# Patient Record
Sex: Female | Born: 1975
Health system: Southern US, Community
[De-identification: ages and names within clinical notes are randomized; demographics above are authoritative.]

## PROBLEM LIST (undated history)

## (undated) ENCOUNTER — Emergency Department (HOSPITAL_COMMUNITY): Admission: EM | Payer: Medicaid Other

## (undated) DIAGNOSIS — I10 Essential (primary) hypertension: Secondary | ICD-10-CM

## (undated) DIAGNOSIS — E079 Disorder of thyroid, unspecified: Secondary | ICD-10-CM

## (undated) DIAGNOSIS — E039 Hypothyroidism, unspecified: Secondary | ICD-10-CM

## (undated) DIAGNOSIS — M549 Dorsalgia, unspecified: Secondary | ICD-10-CM

## (undated) DIAGNOSIS — B1920 Unspecified viral hepatitis C without hepatic coma: Secondary | ICD-10-CM

## (undated) DIAGNOSIS — IMO0002 Reserved for concepts with insufficient information to code with codable children: Secondary | ICD-10-CM

## (undated) DIAGNOSIS — M5430 Sciatica, unspecified side: Secondary | ICD-10-CM

## (undated) DIAGNOSIS — Z8719 Personal history of other diseases of the digestive system: Secondary | ICD-10-CM

## (undated) DIAGNOSIS — M25551 Pain in right hip: Secondary | ICD-10-CM

## (undated) DIAGNOSIS — F418 Other specified anxiety disorders: Secondary | ICD-10-CM

## (undated) DIAGNOSIS — F329 Major depressive disorder, single episode, unspecified: Secondary | ICD-10-CM

## (undated) DIAGNOSIS — K219 Gastro-esophageal reflux disease without esophagitis: Secondary | ICD-10-CM

## (undated) DIAGNOSIS — F4310 Post-traumatic stress disorder, unspecified: Secondary | ICD-10-CM

## (undated) DIAGNOSIS — E162 Hypoglycemia, unspecified: Secondary | ICD-10-CM

## (undated) DIAGNOSIS — F319 Bipolar disorder, unspecified: Secondary | ICD-10-CM

## (undated) DIAGNOSIS — F1911 Other psychoactive substance abuse, in remission: Secondary | ICD-10-CM

## (undated) DIAGNOSIS — F1011 Alcohol abuse, in remission: Secondary | ICD-10-CM

## (undated) DIAGNOSIS — E7800 Pure hypercholesterolemia, unspecified: Secondary | ICD-10-CM

## (undated) DIAGNOSIS — Z9989 Dependence on other enabling machines and devices: Secondary | ICD-10-CM

## (undated) DIAGNOSIS — R0602 Shortness of breath: Secondary | ICD-10-CM

## (undated) DIAGNOSIS — J449 Chronic obstructive pulmonary disease, unspecified: Secondary | ICD-10-CM

## (undated) DIAGNOSIS — J45909 Unspecified asthma, uncomplicated: Secondary | ICD-10-CM

## (undated) DIAGNOSIS — F32A Depression, unspecified: Secondary | ICD-10-CM

## (undated) DIAGNOSIS — M1990 Unspecified osteoarthritis, unspecified site: Secondary | ICD-10-CM

## (undated) HISTORY — PX: CARPAL TUNNEL RELEASE: SHX101

## (undated) HISTORY — DX: Sciatica, unspecified side: M54.30

## (undated) HISTORY — DX: Major depressive disorder, single episode, unspecified: F32.9

## (undated) HISTORY — DX: Other specified anxiety disorders: F41.8

## (undated) HISTORY — PX: TUBAL LIGATION: SHX77

## (undated) HISTORY — DX: Unspecified viral hepatitis C without hepatic coma: B19.20

## (undated) HISTORY — DX: Alcohol abuse, in remission: F10.11

## (undated) HISTORY — PX: SHOULDER SURGERY: SHX246

## (undated) HISTORY — DX: Post-traumatic stress disorder, unspecified: F43.10

## (undated) HISTORY — DX: Depression, unspecified: F32.A

## (undated) HISTORY — DX: Other psychoactive substance abuse, in remission: F19.11

## (undated) HISTORY — PX: WISDOM TOOTH EXTRACTION: SHX21

---

## 2001-04-04 ENCOUNTER — Ambulatory Visit (HOSPITAL_COMMUNITY): Admission: RE | Admit: 2001-04-04 | Discharge: 2001-04-04 | Payer: Self-pay | Admitting: Internal Medicine

## 2001-04-04 ENCOUNTER — Encounter: Payer: Self-pay | Admitting: Internal Medicine

## 2001-10-31 ENCOUNTER — Ambulatory Visit (HOSPITAL_COMMUNITY): Admission: RE | Admit: 2001-10-31 | Discharge: 2001-10-31 | Payer: Self-pay | Admitting: Internal Medicine

## 2001-10-31 ENCOUNTER — Encounter: Payer: Self-pay | Admitting: Internal Medicine

## 2004-08-18 ENCOUNTER — Encounter: Admission: RE | Admit: 2004-08-18 | Discharge: 2004-10-13 | Payer: Self-pay | Admitting: Orthopedic Surgery

## 2004-10-21 ENCOUNTER — Encounter: Admission: RE | Admit: 2004-10-21 | Discharge: 2004-11-17 | Payer: Self-pay | Admitting: Orthopedic Surgery

## 2005-02-14 ENCOUNTER — Ambulatory Visit: Payer: Self-pay | Admitting: Psychiatry

## 2005-02-14 ENCOUNTER — Inpatient Hospital Stay (HOSPITAL_COMMUNITY): Admission: RE | Admit: 2005-02-14 | Discharge: 2005-02-19 | Payer: Self-pay | Admitting: Psychiatry

## 2005-05-02 ENCOUNTER — Encounter: Admission: RE | Admit: 2005-05-02 | Discharge: 2005-06-22 | Payer: Self-pay | Admitting: Orthopaedic Surgery

## 2006-04-10 ENCOUNTER — Ambulatory Visit: Payer: Self-pay | Admitting: Orthopedic Surgery

## 2006-04-17 ENCOUNTER — Encounter: Admission: RE | Admit: 2006-04-17 | Discharge: 2006-04-17 | Payer: Self-pay | Admitting: Orthopedic Surgery

## 2006-05-10 ENCOUNTER — Emergency Department (HOSPITAL_COMMUNITY): Admission: EM | Admit: 2006-05-10 | Discharge: 2006-05-10 | Payer: Self-pay | Admitting: Emergency Medicine

## 2006-10-18 ENCOUNTER — Ambulatory Visit (HOSPITAL_BASED_OUTPATIENT_CLINIC_OR_DEPARTMENT_OTHER): Admission: RE | Admit: 2006-10-18 | Discharge: 2006-10-18 | Payer: Self-pay | Admitting: Orthopedic Surgery

## 2007-03-14 ENCOUNTER — Emergency Department (HOSPITAL_COMMUNITY): Admission: EM | Admit: 2007-03-14 | Discharge: 2007-03-14 | Payer: Self-pay | Admitting: Pediatrics

## 2008-06-21 ENCOUNTER — Emergency Department (HOSPITAL_COMMUNITY): Admission: EM | Admit: 2008-06-21 | Discharge: 2008-06-21 | Payer: Self-pay | Admitting: Emergency Medicine

## 2009-03-12 ENCOUNTER — Emergency Department (HOSPITAL_COMMUNITY): Admission: EM | Admit: 2009-03-12 | Discharge: 2009-03-12 | Payer: Self-pay | Admitting: Emergency Medicine

## 2009-06-13 ENCOUNTER — Emergency Department (HOSPITAL_COMMUNITY): Admission: EM | Admit: 2009-06-13 | Discharge: 2009-06-13 | Payer: Self-pay | Admitting: Emergency Medicine

## 2009-07-06 ENCOUNTER — Emergency Department (HOSPITAL_COMMUNITY): Admission: EM | Admit: 2009-07-06 | Discharge: 2009-07-06 | Payer: Self-pay | Admitting: Emergency Medicine

## 2009-08-10 ENCOUNTER — Emergency Department (HOSPITAL_COMMUNITY): Admission: EM | Admit: 2009-08-10 | Discharge: 2009-08-10 | Payer: Self-pay | Admitting: Emergency Medicine

## 2009-10-12 ENCOUNTER — Emergency Department: Payer: Self-pay | Admitting: Emergency Medicine

## 2009-11-23 ENCOUNTER — Emergency Department (HOSPITAL_COMMUNITY): Admission: EM | Admit: 2009-11-23 | Discharge: 2009-11-23 | Payer: Self-pay | Admitting: Emergency Medicine

## 2010-06-22 ENCOUNTER — Encounter
Admission: RE | Admit: 2010-06-22 | Discharge: 2010-09-16 | Payer: Self-pay | Source: Home / Self Care | Attending: *Deleted | Admitting: *Deleted

## 2011-02-04 NOTE — Op Note (Signed)
Deborah Barnes, [name]             ACCOUNT NO.:  [id number]   MEDICAL RECORD NO.:  [id number]          PATIENT TYPE:  AMB   LOCATION:  DSC                          FACILITY:  [facility]   PHYSICIAN:  Artist Pais. Weingold, M.D.DATE OF BIRTH:  Apr 18, 1976   DATE OF PROCEDURE:  10/18/2006  DATE OF DISCHARGE:                               OPERATIVE REPORT   PREOPERATIVE DIAGNOSIS:  Chronic bilateral lateral epicondylitis and  chronic left carpal tunnel syndrome.   POSTOPERATIVE DIAGNOSIS:  Chronic bilateral lateral epicondylitis and  chronic left carpal tunnel syndrome.   PROCEDURE:  Left carpal tunnel release and injection bilateral  epicondyle.   SURGEON:  Weingold.   ASSISTANT:  None.   ANESTHESIA:  General.   TOURNIQUET TIME:  15 minutes.   COMPLICATIONS:  None.   DRAINS:  None.   OPERATIVE REPORT:  The patient was taken to the operating suite.  After  the induction of adequate general anesthesia, the left upper extremity  is prepped and draped in the usual sterile fashion.  A 2-cm incision was  made in the palmar aspect of the left hand in line with the long finger  metacarpal, starting at [facility] cardinal line.  The skin was incised.  The palmar fascia was identified and split.  The distal edge of the  transverse carpal ligament was identified with a 15 blade and split.  The median nerve was identified and protected with a Therapist, nutritional.  The remaining aspect of the transverse carpal ligament was divided under  direct vision using a curved blunt scissor.  The canal was inspected.  There were no osseous lesions or ganglions present.  It was irrigated  and loosely closed with 3-0 Prolene subcuticular stitch.  Steri-Strips,  4 x 4s, fluffs, and compressive dressing was applied.  The patient then  had both of her lateral epicondyles prepped with alcohol preps and  injected with Celestone and Marcaine 1:1.  The patient tolerated all 3  procedures well and went to recovery room in  stable condition.      Artist Pais Mina Marble, M.D.  Electronically Signed     MAW/MEDQ  D:  10/18/2006  T:  10/18/2006  Job:  [id number]

## 2011-02-04 NOTE — Discharge Summary (Signed)
NAMEMALORIE, BIGFORD              ACCOUNT NO.:  [id number]   MEDICAL RECORD NO.:  [id number]          PATIENT TYPE:  IPS   LOCATION:  0506                          FACILITY:  [facility]   PHYSICIAN:  Geoffery Lyons, M.D.      DATE OF BIRTH:  April 11, 1976   DATE OF ADMISSION:  02/14/2005  DATE OF DISCHARGE:  02/19/2005                                 DISCHARGE SUMMARY   CHIEF COMPLAINT AND PRESENT ILLNESS:  This was first admission to [facility] for this 35 year old divorced white female involuntarily  admitted. History of intentional overdose. She overdosed on Depakote 500 - a  full bottle, Lexapro 20 mg taking 9, atenolol 50 uncertain amount, and  Topamax. They were the patient's medications, overdosed when she was at  home, left a note to her mother stating it is over. Had been smoking  crack, smoking too long, drinking liquor, and taken pain pill and marijuana.  She claimed the stressor was that she just ________________. Was found by  the mother and her boyfriend. Intention was to do die.   PAST PSYCHIATRIC HISTORY:  First time in [employer]. History of  bipolar disorder. Hospitalized prior at [facility] and [facility].  Past suicidal attempt by overdosing.   ALCOHOL AND DRUG HISTORY:  Smokes, has been drinking liquor, positive black  outs, denies any seizures, no IV drug use, using marijuana and cocaine as  well.   PAST MEDICAL HISTORY:  Hypertension.   MEDICATIONS:  1.  Atenolol 50 mg per day.  2.  Topamax uncertain dose.  3.  Lexapro 20 mg per day.  4.  Depakote ER 500 at night.   PHYSICAL EXAMINATION:  Physical exam performed, did not show any acute  findings.   LABORATORY DATA:  Drug screen positive for cocaine, marijuana,  benzodiazepines. The Depakote level was 146, down to 101 prior to  transverse. Depakote level upon this evaluation 140.9.   MENTAL STATUS EXAMINATION:  Reveals a sleepy female, sick of being  cooperative, makes fair eye  contact. Does fall asleep when not stimulated  at times. Speech was soft spoken, normal pace. Mood was depressed. Affect  was flat. Thought processes were logical, coherent and relevant. No evidence  of psychosis. No active suicidal or homicidal ideations. Cognition well  preserved.   ADMISSION DIAGNOSES:   AXIS I:  1.  Bipolar disorder.  2.  Polysubstance abuse.   AXIS II:  Borderline personality disorder traits.   AXIS III:  Hypertension.   AXIS IV:  Moderate.   AXIS V:  Global Assessment of Functioning upon admission 25, highest Global  Assessment of Functioning in the last year 60.   COURSE IN THE HOSPITAL:  She was admitted and started in individual and  group psychotherapy. She was given Ambien for sleep. She was detoxified with  Librium, and she was given Nexium. She was restarted on Topamax 100 twice a  day. She was given trazodone for sleep. Trazodone was not effective. She was  switched to [facility]. Alfonso Patten was not effective, so she went back to the  Ambien. She endorsed  she has been under a lot of stress, increasingly more  depressed, using substances. Was feeling not supported by the boyfriend when  he is out of town, and apparently he goes out of town a lot. Mood depressed.  Affect anxious. Endorsed pain, feeling overwhelmed. Endorsed being very  upset because she did not want to be a crack head. Claims that is why she  overdosed, feeling guilty for her use. Sleep was an issue. Endorsed  nightmares early in the morning. Continued to focus on her not wanting to be  a crack head. By June 3, she endorsed she was feeling much better. Mood had  improved. Had to work on self and coping skills. Family session with mother,  she reported not being depressed, endorsed that she understood that she  needed to change her lifestyle. Mother was supportive. Endorsed also that  other family members and boyfriend were supportive. She was going to attend  [employer], get a sponsor, got to  counseling, and take her medication. She was in  full contact with reality. No suicidal and no homicidal ideations. No  withdrawal. So we went ahead and discharged to outpatient followup.   DISCHARGE DIAGNOSES:   AXIS I:  1.  Bipolar disorder, not otherwise specified.  2.  Polysubstance abuse.   AXIS II:  Borderline personality traits.   AXIS III:  Hypertension.   AXIS IV:  Moderate.   AXIS V:  Global Assessment of Functioning upon discharge 50.   DISCHARGE MEDICATIONS:  1.  Topamax 100 twice a day.  2.  Symmetrel 100 twice a day.  3.  Depakote ER 250 at night.  4.  Lunesta 3 mg at night as needed.   FOLLOW UP:  Yvonna Alanis in [place].     IL/MEDQ  D:  03/17/2005  T:  03/18/2005  Job:  [id number]

## 2011-02-04 NOTE — H&P (Signed)
Deborah Barnes, Deborah Barnes              ACCOUNT NO.:  [id number]   MEDICAL RECORD NO.:  [id number]          PATIENT TYPE:  IPS   LOCATION:  0506                          FACILITY:  [facility]   PHYSICIAN:  Geoffery Lyons, M.D.      DATE OF BIRTH:  1976/02/27   DATE OF ADMISSION:  02/14/2005  DATE OF DISCHARGE:                         PSYCHIATRIC ADMISSION ASSESSMENT   IDENTIFYING INFORMATION:  A 35 year old divorced white female voluntarily  admitted on Feb 14, 2005.   HISTORY OF PRESENT ILLNESS:  The patient presents with a history of  intentional overdose.  She overdosed on Depakote 500 mg, she reports a full  bottle, Lexapro 20 mg taking 9 and atenolol 50 mg uncertain amount, and  Topamax.  They were the patient's medications.  She overdosed on Monday  while she was at home.  She left a note to her mother stating It's over.  Patient states that she also has been smoking crack cocaine, smoking too  long, drinking liquor and taking pain pills and marijuana.  Patient reports  that her stressors are that all she does is sit around and live.  Patient  was found by her mother and a boyfriend.  Patient's intention was to die.  She is glad now that nothing has happened.  She wants to be there for the  children.  She is denying any psychotic symptoms.   PAST PSYCHIATRIC HISTORY:  First admission to [facility].  She  has a history of bipolar disorder.  She was hospitalized prior at [facility]  and [facility] for suicide attempts by overdosing.  No current  outpatient treatment.  She has not been detoxed prior.   SOCIAL HISTORY:  This is a 35 year old divorced white female with 2 children  ages 40 years and 68 months of age.  The children are with her mother.  The  patient lives with her mother and the children.  She is unemployed.  No  legal issues.   FAMILY HISTORY:  Unclear.   ALCOHOL AND DRUG HISTORY:  Patient smokes.  She has been drinking liquor.  Reports history of  blackouts, denies any seizures, no IV drug use.  She has  been using marijuana and crack cocaine as well.   PRIMARY CARE Ulysses Alper:  Dr. Prudy Feeler at [facility].   MEDICAL PROBLEMS:  Hypertension.   MEDICATIONS:  1.  Atenolol 50 mg daily.  2.  Topamax uncertain dose.  3.  Lexapro 20 mg daily.  4.  Depakote ER 500 mg at bedtime which were prescribed by Dr. Yetta Barre.   DRUG ALLERGIES:  No known allergies.   PHYSICAL EXAMINATION:  Patient was assessed at [facility].  This is  a well-developed, well-nourished female, obese.  She received charcoal.  Poison Control was notified.   LABORATORY DATA:  Urine drug screen was positive for cocaine, positive for  THC, positive for benzos.  Depakote level was 146 down to 101 prior to  transfer.  Depakote level today is 140.9.  Total protein 6.7, albumin 3.6.  EKG is a normal EKG.   MENTAL STATUS EXAM:  Somewhat sleepy  female in the bed.  She is sick of  being cooperative.  Makes fair eye contact.  She does fall sleep when not  stimulated at times.  Speech is soft spoken, normal pace.  Mood is  depressed.  Affect is somewhat flat.  Thought processes are coherent.  There  is no evidence of psychosis.  Cognitive function is intact.  Memory is fair.  Judgment is poor.  Insight is limited.  Poor impulse control.   ADMISSION DIAGNOSES:   AXIS I:  1.  Bipolar disorder not otherwise specified.  2.  Polysubstance abuse.   AXIS II:  Borderline disorder per chart.   AXIS III:  1.  Hypertension.  2.  Tobacco abuse.   AXIS IV:  Problems with primary support group, occupation, other  psychosocial problems.   AXIS V:  Current is 25, estimated this past year 74.   PLAN:  Stabilize mood and thinking.  Contract for safety.  We will detox the  patient from alcohol use, work on relapse prevention.  Patient is to  increase coping skills.  Tobacco cessation was discussed.  Patient is be  medication compliant.  Patient will need to  follow up with mental health, to  attend AA and NA meetings.  Poison Control will be notified about elevated  Depakote level and receive appropriate recommendations.  We will consider  family session with mother as well for support.  Tentative length of stay 5-  6 days.       JO/MEDQ  D:  02/16/2005  T:  02/16/2005  Job:  [id number]

## 2011-10-12 ENCOUNTER — Emergency Department (HOSPITAL_COMMUNITY)
Admission: EM | Admit: 2011-10-12 | Discharge: 2011-10-12 | Disposition: A | Discharge status: Home / Self Care | Payer: Medicaid Other | Attending: Emergency Medicine | Admitting: Emergency Medicine

## 2011-10-12 ENCOUNTER — Encounter (HOSPITAL_COMMUNITY): Payer: Self-pay

## 2011-10-12 DIAGNOSIS — I10 Essential (primary) hypertension: Secondary | ICD-10-CM | POA: Insufficient documentation

## 2011-10-12 DIAGNOSIS — F319 Bipolar disorder, unspecified: Secondary | ICD-10-CM | POA: Insufficient documentation

## 2011-10-12 DIAGNOSIS — S335XXA Sprain of ligaments of lumbar spine, initial encounter: Secondary | ICD-10-CM | POA: Insufficient documentation

## 2011-10-12 DIAGNOSIS — Z79899 Other long term (current) drug therapy: Secondary | ICD-10-CM | POA: Insufficient documentation

## 2011-10-12 DIAGNOSIS — E7800 Pure hypercholesterolemia, unspecified: Secondary | ICD-10-CM | POA: Insufficient documentation

## 2011-10-12 DIAGNOSIS — K219 Gastro-esophageal reflux disease without esophagitis: Secondary | ICD-10-CM | POA: Insufficient documentation

## 2011-10-12 DIAGNOSIS — X503XXA Overexertion from repetitive movements, initial encounter: Secondary | ICD-10-CM | POA: Insufficient documentation

## 2011-10-12 DIAGNOSIS — S39012A Strain of muscle, fascia and tendon of lower back, initial encounter: Secondary | ICD-10-CM

## 2011-10-12 HISTORY — DX: Pure hypercholesterolemia, unspecified: E78.00

## 2011-10-12 HISTORY — DX: Essential (primary) hypertension: I10

## 2011-10-12 HISTORY — DX: Gastro-esophageal reflux disease without esophagitis: K21.9

## 2011-10-12 HISTORY — DX: Dorsalgia, unspecified: M54.9

## 2011-10-12 MED ORDER — METHOCARBAMOL 500 MG PO TABS: ORAL_TABLET | ORAL | Status: DC

## 2011-10-12 MED ORDER — OXYCODONE-ACETAMINOPHEN 5-325 MG PO TABS: 1.0000 | ORAL_TABLET | ORAL | Status: AC | PRN

## 2011-10-12 NOTE — ED Notes (Signed)
Pt reports had been moving furniture and cleaning her grandmother's house today.  Pt says felt something pop in lower back.  Has had pain since.

## 2011-10-12 NOTE — ED Provider Notes (Signed)
History     CSN: [id number]  Arrival date & time 10/12/11  1707   First MD Initiated Contact with Patient 10/12/11 1728      Chief Complaint  Patient presents with  . Back Pain    (Consider location/radiation/quality/duration/timing/severity/associated sxs/prior treatment) HPI Comments: Patient c/o diffuse lower back pain for several hours.  PAin began after moving furniture.  Describes the pain as sharp and throbbing.  Pain radiates across her lower back.  She denies fall, incontinence of urine or feces, weakness or numbness of the lower extremities.    Patient is a 36 y.o. female presenting with back pain. The history is provided by the patient. No language interpreter was used.  Back Pain  This is a new problem. The current episode started 6 to 12 hours ago. The problem occurs constantly. The problem has not changed since onset.The pain is associated with lifting heavy objects and twisting. The pain is present in the lumbar spine. The quality of the pain is described as aching. The pain does not radiate. The pain is moderate. The symptoms are aggravated by bending, twisting and certain positions. The pain is the same all the time. Pertinent negatives include no chest pain, no fever, no numbness, no abdominal pain, no abdominal swelling, no bowel incontinence, no perianal numbness, no bladder incontinence, no dysuria, no pelvic pain, no leg pain, no paresthesias, no paresis, no tingling and no weakness. Treatments tried: ultram. The treatment provided no relief.    Past Medical History  Diagnosis Date  . Hypertension   . Hypercholesterolemia   . GERD (gastroesophageal reflux disease)   . Back pain   . Bipolar 1 disorder     Past Surgical History  Procedure Date  . Carpal tunnel release   . Tubal ligation     History reviewed. No pertinent family history.  History  Substance Use Topics  . Smoking status: Current Everyday Smoker  . Smokeless tobacco: Not on file  . Alcohol  Use: No    OB History    Grav Para Term Preterm Abortions TAB SAB Ect Mult Living                  Review of Systems  Constitutional: Negative for fever, activity change and appetite change.  Cardiovascular: Negative for chest pain.  Gastrointestinal: Negative for abdominal pain and bowel incontinence.  Genitourinary: Negative for bladder incontinence, dysuria, hematuria, flank pain, difficulty urinating and pelvic pain.  Musculoskeletal: Positive for back pain. Negative for joint swelling and gait problem.  Skin: Negative.   Neurological: Negative for tingling, weakness, numbness and paresthesias.  All other systems reviewed and are negative.    Allergies  Darvocet  Home Medications   Current Outpatient Rx  Name Route Sig Dispense Refill  . ALPRAZOLAM 1 MG PO TABS Oral Take 1 mg by mouth 3 (three) times daily as needed. For ANXIETY    . VITAMIN C PO Oral Take 1 tablet by mouth daily.    . ATENOLOL 50 MG PO TABS Oral Take 25 mg by mouth daily. For HIGH BLOOD PRESSURE    . CARBAMAZEPINE 200 MG PO TABS Oral Take 400 mg by mouth daily. for MOOD SWINGS    . CETIRIZINE HCL 10 MG PO TABS Oral Take 10 mg by mouth daily. For ALLERGIES    . VITAMIN D 2000 UNITS PO CAPS Oral Take 2 capsules by mouth daily.    . CHOLINE FENOFIBRATE 135 MG PO CPDR Oral Take 135 mg by mouth  daily. For HIGH TRIGLYCERIDES    . ESCITALOPRAM OXALATE 10 MG PO TABS Oral Take 10 mg by mouth daily. For DEPRESSION    . HYDROCHLOROTHIAZIDE 25 MG PO TABS Oral Take 12.5 mg by mouth daily. For FLUID    . HYDROCODONE-ACETAMINOPHEN 10-325 MG PO TABS Oral Take 1 tablet by mouth every 6 (six) hours as needed. FOR PAIN    . LAMOTRIGINE 200 MG PO TABS Oral Take 200 mg by mouth daily. For BIPOLAR    . OMEPRAZOLE 20 MG PO CPDR Oral Take 20 mg by mouth 2 (two) times daily. For GERD    . TIZANIDINE HCL 4 MG PO TABS Oral Take 4 mg by mouth 3 (three) times daily. MUSCLE SPASMS    . TRAMADOL HCL 50 MG PO TABS Oral Take 50 mg by  mouth every 6 (six) hours as needed. PAIN    . TRAZODONE HCL 100 MG PO TABS Oral Take 100 mg by mouth at bedtime. For SLEEP      BP 111/66  Pulse 72  Temp(Src) 98.2 F (36.8 C) (Oral)  Resp 18  Ht 5\' 5"  (1.651 m)  Wt 255 lb (115.667 kg)  BMI 42.43 kg/m2  SpO2 100%  LMP 10/12/2011  Physical Exam  Nursing note and vitals reviewed. Constitutional: She is oriented to person, place, and time. She appears well-developed and well-nourished. No distress.  HENT:  Head: Normocephalic and atraumatic.  Mouth/Throat: Oropharynx is clear and moist.  Neck: Normal range of motion. Neck supple.  Cardiovascular: Normal rate, regular rhythm and normal heart sounds.   Pulmonary/Chest: Effort normal and breath sounds normal. No respiratory distress.  Musculoskeletal: Normal range of motion. She exhibits tenderness. She exhibits no edema.       Lumbar back: She exhibits tenderness and pain. She exhibits normal range of motion, no bony tenderness, no swelling, no edema, no spasm and normal pulse.       Back:  Lymphadenopathy:    She has no cervical adenopathy.  Neurological: She is alert and oriented to person, place, and time. No cranial nerve deficit or sensory deficit. She exhibits normal muscle tone. Coordination normal.  Reflex Scores:      Patellar reflexes are 2+ on the right side and 2+ on the left side.      Achilles reflexes are 2+ on the right side and 2+ on the left side. Skin: Skin is warm and dry.    ED Course  Procedures (including critical care time)     MDM    ttp of the lumbar paraspinal muscles.  No focal neuro deficits on exam.  Patient sleeping upon entrance into the exam room.  NAd.  Vitals stable.  Moves all extremities w/o difficulty.  Likely muscular strain.         Pearlie Nies L. [place], [place] 10/16/11 [phone]

## 2011-10-17 NOTE — ED Provider Notes (Signed)
Medical screening examination/treatment/procedure(s) were performed by non-physician practitioner and as supervising physician I was immediately available for consultation/collaboration.   Geoffery Lyons, MD 10/17/11 [phone]

## 2012-02-22 ENCOUNTER — Encounter (HOSPITAL_COMMUNITY): Payer: Self-pay | Admitting: Emergency Medicine

## 2012-02-22 ENCOUNTER — Emergency Department (HOSPITAL_COMMUNITY)
Admission: EM | Admit: 2012-02-22 | Discharge: 2012-02-22 | Disposition: A | Discharge status: Home / Self Care | Payer: Medicaid Other | Attending: Emergency Medicine | Admitting: Emergency Medicine

## 2012-02-22 DIAGNOSIS — M549 Dorsalgia, unspecified: Secondary | ICD-10-CM

## 2012-02-22 DIAGNOSIS — M79609 Pain in unspecified limb: Secondary | ICD-10-CM | POA: Insufficient documentation

## 2012-02-22 DIAGNOSIS — F17200 Nicotine dependence, unspecified, uncomplicated: Secondary | ICD-10-CM | POA: Insufficient documentation

## 2012-02-22 DIAGNOSIS — M545 Low back pain, unspecified: Secondary | ICD-10-CM | POA: Insufficient documentation

## 2012-02-22 DIAGNOSIS — F319 Bipolar disorder, unspecified: Secondary | ICD-10-CM | POA: Insufficient documentation

## 2012-02-22 DIAGNOSIS — K219 Gastro-esophageal reflux disease without esophagitis: Secondary | ICD-10-CM | POA: Insufficient documentation

## 2012-02-22 DIAGNOSIS — I10 Essential (primary) hypertension: Secondary | ICD-10-CM | POA: Insufficient documentation

## 2012-02-22 DIAGNOSIS — E7800 Pure hypercholesterolemia, unspecified: Secondary | ICD-10-CM | POA: Insufficient documentation

## 2012-02-22 DIAGNOSIS — Z9181 History of falling: Secondary | ICD-10-CM | POA: Insufficient documentation

## 2012-02-22 HISTORY — DX: Reserved for concepts with insufficient information to code with codable children: IMO0002

## 2012-02-22 MED ORDER — DEXAMETHASONE 6 MG PO TABS: ORAL_TABLET | ORAL | Status: AC

## 2012-02-22 MED ORDER — DEXAMETHASONE SODIUM PHOSPHATE 4 MG/ML IJ SOLN
8.0000 mg | Freq: Once | INTRAMUSCULAR | Status: AC
  Administered 2012-02-22: 8 mg via INTRAMUSCULAR
  Filled 2012-02-22: qty 2

## 2012-02-22 MED ORDER — DIPHENHYDRAMINE HCL 12.5 MG/5ML PO ELIX
12.5000 mg | ORAL_SOLUTION | Freq: Once | ORAL | Status: AC
  Administered 2012-02-22: 12.5 mg via ORAL
  Filled 2012-02-22: qty 5

## 2012-02-22 MED ORDER — MORPHINE SULFATE 2 MG/ML IJ SOLN: 8.0000 mg | Freq: Once | INTRAMUSCULAR | Status: DC

## 2012-02-22 MED ORDER — MORPHINE SULFATE 4 MG/ML IJ SOLN
INTRAMUSCULAR | Status: AC
  Administered 2012-02-22: 8 mg via INTRAMUSCULAR
  Filled 2012-02-22: qty 2

## 2012-02-22 MED ORDER — ONDANSETRON HCL 4 MG PO TABS
4.0000 mg | ORAL_TABLET | Freq: Once | ORAL | Status: AC
  Administered 2012-02-22: 4 mg via ORAL
  Filled 2012-02-22: qty 1

## 2012-02-22 NOTE — Discharge Instructions (Signed)
Please continue your pain medication and muscle relaxer medication. Please add Decadron one daily with food to your current medications. Please see your primary care physician for specialty referral as sone as it is feasible. Please alternate heat and ice to your lower back please.Back Pain, Adult Low back pain is very common. About 1 in 5 people have back pain.The cause of low back pain is rarely dangerous. The pain often gets better over time.About half of people with a sudden onset of back pain feel better in just 2 weeks. About 8 in 10 people feel better by 6 weeks.  CAUSES Some common causes of back pain include:  Strain of the muscles or ligaments supporting the spine.   Wear and tear (degeneration) of the spinal discs.   Arthritis.   Direct injury to the back.  DIAGNOSIS Most of the time, the direct cause of low back pain is not known.However, back pain can be treated effectively even when the exact cause of the pain is unknown.Answering your caregiver's questions about your overall health and symptoms is one of the most accurate ways to make sure the cause of your pain is not dangerous. If your caregiver needs more information, he or she may order lab work or imaging tests (X-rays or MRIs).However, even if imaging tests show changes in your back, this usually does not require surgery. HOME CARE INSTRUCTIONS For many people, back pain returns.Since low back pain is rarely dangerous, it is often a condition that people can learn to [facility] their own.   Remain active. It is stressful on the back to sit or stand in one place. Do not sit, drive, or stand in one place for more than 30 minutes at a time. Take short walks on level surfaces as soon as pain allows.Try to increase the length of time you walk each day.   Do not stay in bed.Resting more than 1 or 2 days can delay your recovery.   Do not avoid exercise or work.Your body is made to move.It is not dangerous to be active,  even though your back may hurt.Your back will likely heal faster if you return to being active before your pain is gone.   Pay attention to your body when you bend and lift. Many people have less discomfortwhen lifting if they bend their knees, keep the load close to their bodies,and avoid twisting. Often, the most comfortable positions are those that put less stress on your recovering back.   Find a comfortable position to sleep. Use a firm mattress and lie on your side with your knees slightly bent. If you lie on your back, put a pillow under your knees.   Only take over-the-counter or prescription medicines as directed by your caregiver. Over-the-counter medicines to reduce pain and inflammation are often the most helpful.Your caregiver may prescribe muscle relaxant drugs.These medicines help dull your pain so you can more quickly return to your normal activities and healthy exercise.   Put ice on the injured area.   Put ice in a plastic bag.   Place a towel between your skin and the bag.   Leave the ice on for 15 to 20 minutes, 3 to 4 times a day for the first 2 to 3 days. After that, ice and heat may be alternated to reduce pain and spasms.   Ask your caregiver about trying back exercises and gentle massage. This may be of some benefit.   Avoid feeling anxious or stressed.Stress increases muscle tension and can  worsen back pain.It is important to recognize when you are anxious or stressed and learn ways to manage it.Exercise is a great option.  SEEK MEDICAL CARE IF:  You have pain that is not relieved with rest or medicine.   You have pain that does not improve in 1 week.   You have new symptoms.   You are generally not feeling well.  SEEK IMMEDIATE MEDICAL CARE IF:   You have pain that radiates from your back into your legs.   You develop new bowel or bladder control problems.   You have unusual weakness or numbness in your arms or legs.   You develop nausea or  vomiting.   You develop abdominal pain.   You feel faint.  Document Released: 09/05/2005 Document Revised: 08/25/2011 Document Reviewed: 01/24/2011 [facility] Patient Information 2012 [place], [place].

## 2012-02-22 NOTE — ED Notes (Signed)
Patient reports using ting unit, vicodin, and muscle relaxer with no relief. Reports some relief with heat.

## 2012-02-22 NOTE — ED Notes (Signed)
Pain rt lower back , into  Hip and down to knee.  Onset app 1 month ago when fell.

## 2012-02-22 NOTE — ED Notes (Signed)
Pt left her cell phone, called [phone]

## 2012-02-22 NOTE — ED Notes (Signed)
Patient c/o lower back pain that radiates into right hip and down right leg to knee. Per patient pain has been getting worse since she fell a month ago. Seen PCP and had x-rays on May 14th. Per patient insurance would not improve the MRI so PCP told her to come to ER. Patient reports hx of degenerative disc disease and 2 bulging discs.

## 2012-02-22 NOTE — ED Provider Notes (Signed)
History     CSN: [id number]  Arrival date & time 02/22/12  1702   None     Chief Complaint  Patient presents with  . Back Pain  . Hip Pain  . Leg Pain    (Consider location/radiation/quality/duration/timing/severity/associated sxs/prior treatment) Patient is a 36 y.o. female presenting with back pain, hip pain, and leg pain. The history is provided by the patient.  Back Pain  This is a chronic problem. The problem occurs daily. The problem has been gradually worsening. The pain is associated with falling (fell over a month ago). The pain is present in the lumbar spine. The quality of the pain is described as aching. The pain is severe. The pain is the same all the time. Associated symptoms include leg pain. Pertinent negatives include no chest pain, no abdominal pain, no bowel incontinence, no perianal numbness, no bladder incontinence, no dysuria and no paresthesias. She has tried analgesics and muscle relaxants for the symptoms. The treatment provided no relief. Risk factors include obesity.  Hip Pain Pertinent negatives include no abdominal pain, arthralgias, chest pain, coughing or neck pain.  Leg Pain     Past Medical History  Diagnosis Date  . Hypertension   . Hypercholesterolemia   . GERD (gastroesophageal reflux disease)   . Back pain   . Bipolar 1 disorder   . Bulging disc   . Degenerative disc disease     Past Surgical History  Procedure Date  . Carpal tunnel release   . Tubal ligation     Family History  Problem Relation Age of Onset  . Diabetes Father     History  Substance Use Topics  . Smoking status: Current Everyday Smoker -- 1.0 packs/day for 20 years    Types: Cigarettes  . Smokeless tobacco: Never Used  . Alcohol Use: No    OB History    Grav Para Term Preterm Abortions TAB SAB Ect Mult Living   11 2 2  9  9   2       Review of Systems  Constitutional: Negative for activity change.       All ROS Neg except as noted in HPI  HENT:  Negative for nosebleeds and neck pain.   Eyes: Negative for photophobia and discharge.  Respiratory: Negative for cough, shortness of breath and wheezing.   Cardiovascular: Negative for chest pain and palpitations.  Gastrointestinal: Negative for abdominal pain, blood in stool and bowel incontinence.  Genitourinary: Negative for bladder incontinence, dysuria, frequency and hematuria.  Musculoskeletal: Positive for back pain. Negative for arthralgias.  Skin: Negative.   Neurological: Negative for dizziness, seizures, speech difficulty and paresthesias.  Psychiatric/Behavioral: Negative for hallucinations and confusion. The patient is nervous/anxious.     Allergies  Clarithromycin and Darvocet  Home Medications   Current Outpatient Rx  Name Route Sig Dispense Refill  . ALPRAZOLAM 1 MG PO TABS Oral Take 1 mg by mouth 3 (three) times daily as needed. For ANXIETY    . VITAMIN C PO Oral Take 1 tablet by mouth daily.    . ATENOLOL 50 MG PO TABS Oral Take 25 mg by mouth daily. For HIGH BLOOD PRESSURE    . CARBAMAZEPINE 200 MG PO TABS Oral Take 400 mg by mouth daily. for MOOD SWINGS    . CETIRIZINE HCL 10 MG PO TABS Oral Take 10 mg by mouth daily. For ALLERGIES    . VITAMIN D 2000 UNITS PO CAPS Oral Take 2 capsules by mouth daily.    Marland Kitchen  CHOLINE FENOFIBRATE 135 MG PO CPDR Oral Take 135 mg by mouth daily. For HIGH TRIGLYCERIDES    . ESCITALOPRAM OXALATE 10 MG PO TABS Oral Take 10 mg by mouth daily. For DEPRESSION    . HYDROCHLOROTHIAZIDE 25 MG PO TABS Oral Take 12.5 mg by mouth daily. For FLUID    . HYDROCODONE-ACETAMINOPHEN 10-325 MG PO TABS Oral Take 1 tablet by mouth every 6 (six) hours as needed. FOR PAIN    . LAMOTRIGINE 200 MG PO TABS Oral Take 200 mg by mouth daily. For BIPOLAR    . METHOCARBAMOL 500 MG PO TABS  Take two tablets po TID prn muscle spasms 42 tablet 0  . OMEPRAZOLE 20 MG PO CPDR Oral Take 20 mg by mouth 2 (two) times daily. For GERD    . TIZANIDINE HCL 4 MG PO TABS Oral Take  4 mg by mouth 3 (three) times daily. MUSCLE SPASMS    . TRAMADOL HCL 50 MG PO TABS Oral Take 50 mg by mouth every 6 (six) hours as needed. PAIN    . TRAZODONE HCL 100 MG PO TABS Oral Take 100 mg by mouth at bedtime. For SLEEP      BP 123/83  Pulse 85  Temp(Src) 97.8 F (36.6 C) (Oral)  Resp 20  Ht 5\' 5"  (1.651 m)  Wt 245 lb (111.131 kg)  BMI 40.77 kg/m2  SpO2 100%  LMP 01/31/2012  Physical Exam  Nursing note and vitals reviewed. Constitutional: She is oriented to person, place, and time. She appears well-developed and well-nourished.  Non-toxic appearance.  HENT:  Head: Normocephalic.  Right Ear: Tympanic membrane and external ear normal.  Left Ear: Tympanic membrane and external ear normal.  Eyes: EOM and lids are normal. Pupils are equal, round, and reactive to light.  Neck: Normal range of motion. Neck supple. Carotid bruit is not present.  Cardiovascular: Normal rate, regular rhythm, normal heart sounds, intact distal pulses and normal pulses.   Pulmonary/Chest: Breath sounds normal. No respiratory distress.  Abdominal: Soft. Bowel sounds are normal. There is no tenderness. There is no guarding.  Musculoskeletal:       Pain to palpation of the paraspinal muscles of the lower back. Pain to palpation of the lumbar spine. Pain is exacerbated with leg raises and with changes of position. Patient has pain of the right foot related to an injury. She is being seen by orthopedics for this particular problem.  Lymphadenopathy:       Head (right side): No submandibular adenopathy present.       Head (left side): No submandibular adenopathy present.    She has no cervical adenopathy.  Neurological: She is alert and oriented to person, place, and time. She has normal strength. No cranial nerve deficit or sensory deficit. She exhibits normal muscle tone. Coordination normal.       No gross neurologic deficit. Patient walks with a limp that this is not new  Skin: Skin is warm and dry.    Psychiatric: She has a normal mood and affect. Her speech is normal.    ED Course  Procedures (including critical care time)  Labs Reviewed - No data to display No results found.   No diagnosis found.    MDM  I have reviewed nursing notes, vital signs, and all appropriate lab and imaging results for this patient. Patient has a history of bulging disc, and degenerative disc disease for some time. This was aggravated by a fall that occurred approximately a month ago. The  patient is seen her primary care physician and they are attempting to get MRIs, but this has not been approved by Medicaid as of yet. The patient states she has pain almost daily and this seems to be getting progressively worse. The patient has not had loss of bowel or bladder function. Patient treated with an injection of dexamethasone and morphine here in the department. She is given a prescription for dexamethasone to add to her current medications. Patient is advised to see her primary physician for additional followup and possible pain management specialist referral.       Kathie Dike, [place] 02/22/12 1857

## 2012-02-22 NOTE — ED Provider Notes (Signed)
Medical screening examination/treatment/procedure(s) were performed by non-physician practitioner and as supervising physician I was immediately available for consultation/collaboration.   Joya Gaskins, MD 02/22/12 [phone]

## 2012-05-03 ENCOUNTER — Emergency Department (HOSPITAL_COMMUNITY)
Admission: EM | Admit: 2012-05-03 | Discharge: 2012-05-03 | Discharge status: Against Medical Advice | Payer: Medicaid Other | Attending: Emergency Medicine | Admitting: Emergency Medicine

## 2012-05-03 ENCOUNTER — Encounter (HOSPITAL_COMMUNITY): Payer: Self-pay | Admitting: *Deleted

## 2012-05-03 DIAGNOSIS — Z0389 Encounter for observation for other suspected diseases and conditions ruled out: Secondary | ICD-10-CM | POA: Insufficient documentation

## 2012-05-03 DIAGNOSIS — Z5321 Procedure and treatment not carried out due to patient leaving prior to being seen by health care provider: Secondary | ICD-10-CM

## 2012-05-03 NOTE — ED Notes (Signed)
Fell 1 hour pta Pain rt ankle, rt arm and back.  No Loc, neck pain

## 2012-05-03 NOTE — ED Provider Notes (Signed)
History     CSN: [id number]  Arrival date & time 05/03/12  1804   First MD Initiated Contact with Patient 05/03/12 2101      Chief Complaint  Patient presents with  . Fall    (Consider location/radiation/quality/duration/timing/severity/associated sxs/prior treatment) HPI  Past Medical History  Diagnosis Date  . Hypertension   . Hypercholesterolemia   . GERD (gastroesophageal reflux disease)   . Back pain   . Bipolar 1 disorder   . Bulging disc   . Degenerative disc disease     Past Surgical History  Procedure Date  . Carpal tunnel release   . Tubal ligation     Family History  Problem Relation Age of Onset  . Diabetes Father     History  Substance Use Topics  . Smoking status: Current Everyday Smoker -- 1.0 packs/day for 20 years    Types: Cigarettes  . Smokeless tobacco: Never Used  . Alcohol Use: No    OB History    Grav Para Term Preterm Abortions TAB SAB Ect Mult Living   11 2 2  9  9   2       Review of Systems  Allergies  Clarithromycin and Darvocet  Home Medications   Current Outpatient Rx  Name Route Sig Dispense Refill  . ALPRAZOLAM 1 MG PO TABS Oral Take 1 mg by mouth 3 (three) times daily as needed. For ANXIETY    . VITAMIN C PO Oral Take 1 tablet by mouth daily.    . ATENOLOL 50 MG PO TABS Oral Take 25 mg by mouth daily. For HIGH BLOOD PRESSURE    . CARBAMAZEPINE 200 MG PO TABS Oral Take 400 mg by mouth daily. for MOOD SWINGS    . CETIRIZINE HCL 10 MG PO TABS Oral Take 10 mg by mouth daily. For ALLERGIES    . VITAMIN D 2000 UNITS PO CAPS Oral Take 2 capsules by mouth daily.    . CHOLINE FENOFIBRATE 135 MG PO CPDR Oral Take 135 mg by mouth daily. For HIGH TRIGLYCERIDES    . ESCITALOPRAM OXALATE 10 MG PO TABS Oral Take 10 mg by mouth daily. For DEPRESSION    . HYDROCHLOROTHIAZIDE 25 MG PO TABS Oral Take 12.5 mg by mouth daily. For FLUID    . HYDROCODONE-ACETAMINOPHEN 10-325 MG PO TABS Oral Take 1 tablet by mouth every 6 (six) hours as  needed. FOR PAIN    . LAMOTRIGINE 200 MG PO TABS Oral Take 200 mg by mouth daily. For BIPOLAR    . METHOCARBAMOL 500 MG PO TABS  Take two tablets po TID prn muscle spasms 42 tablet 0  . OMEPRAZOLE 20 MG PO CPDR Oral Take 20 mg by mouth 2 (two) times daily. For GERD    . TIZANIDINE HCL 4 MG PO TABS Oral Take 4 mg by mouth 3 (three) times daily. MUSCLE SPASMS    . TRAMADOL HCL 50 MG PO TABS Oral Take 50 mg by mouth every 6 (six) hours as needed. PAIN    . TRAZODONE HCL 100 MG PO TABS Oral Take 100 mg by mouth at bedtime. For SLEEP      BP 111/72  Pulse 81  Temp 98.4 F (36.9 C) (Oral)  Resp 18  Ht 5\' 5"  (1.651 m)  Wt 245 lb (111.131 kg)  BMI 40.77 kg/m2  SpO2 100%  LMP 04/19/2012  Physical Exam  ED Course  Procedures (including critical care time)      1. Patient left without being  seen          Carleene Cooper III, MD 05/03/12 2113

## 2012-09-19 DIAGNOSIS — T8859XA Other complications of anesthesia, initial encounter: Secondary | ICD-10-CM

## 2012-09-19 HISTORY — DX: Other complications of anesthesia, initial encounter: T88.59XA

## 2013-04-17 ENCOUNTER — Other Ambulatory Visit (HOSPITAL_COMMUNITY): Payer: Self-pay | Admitting: Physician Assistant

## 2013-04-17 DIAGNOSIS — R110 Nausea: Secondary | ICD-10-CM

## 2013-04-17 DIAGNOSIS — R1011 Right upper quadrant pain: Secondary | ICD-10-CM

## 2013-04-22 ENCOUNTER — Ambulatory Visit (HOSPITAL_COMMUNITY)
Admission: RE | Admit: 2013-04-22 | Discharge: 2013-04-22 | Disposition: A | Discharge status: Home / Self Care | Payer: Medicaid Other | Source: Ambulatory Visit | Attending: Physician Assistant | Admitting: Physician Assistant

## 2013-04-22 DIAGNOSIS — R1011 Right upper quadrant pain: Secondary | ICD-10-CM

## 2013-04-22 DIAGNOSIS — R110 Nausea: Secondary | ICD-10-CM

## 2013-04-23 ENCOUNTER — Ambulatory Visit (HOSPITAL_COMMUNITY)
Admission: RE | Admit: 2013-04-23 | Discharge: 2013-04-23 | Disposition: A | Discharge status: Home / Self Care | Payer: Medicaid Other | Source: Ambulatory Visit | Attending: Physician Assistant | Admitting: Physician Assistant

## 2013-04-23 DIAGNOSIS — R110 Nausea: Secondary | ICD-10-CM | POA: Insufficient documentation

## 2013-04-23 DIAGNOSIS — R1011 Right upper quadrant pain: Secondary | ICD-10-CM | POA: Insufficient documentation

## 2013-04-23 DIAGNOSIS — K7689 Other specified diseases of liver: Secondary | ICD-10-CM | POA: Insufficient documentation

## 2013-04-23 DIAGNOSIS — K8020 Calculus of gallbladder without cholecystitis without obstruction: Secondary | ICD-10-CM | POA: Insufficient documentation

## 2013-04-25 ENCOUNTER — Emergency Department (HOSPITAL_COMMUNITY): Payer: Medicaid Other

## 2013-04-25 ENCOUNTER — Inpatient Hospital Stay (HOSPITAL_COMMUNITY)
Admission: EM | Admit: 2013-04-25 | Discharge: 2013-04-26 | DRG: 419 | Disposition: A | Discharge status: Home / Self Care | Payer: Medicaid Other | Attending: General Surgery | Admitting: General Surgery

## 2013-04-25 ENCOUNTER — Encounter (HOSPITAL_COMMUNITY): Payer: Self-pay | Admitting: Emergency Medicine

## 2013-04-25 DIAGNOSIS — K8050 Calculus of bile duct without cholangitis or cholecystitis without obstruction: Secondary | ICD-10-CM

## 2013-04-25 DIAGNOSIS — IMO0002 Reserved for concepts with insufficient information to code with codable children: Secondary | ICD-10-CM | POA: Diagnosis present

## 2013-04-25 DIAGNOSIS — F319 Bipolar disorder, unspecified: Secondary | ICD-10-CM | POA: Diagnosis present

## 2013-04-25 DIAGNOSIS — Z23 Encounter for immunization: Secondary | ICD-10-CM

## 2013-04-25 DIAGNOSIS — I10 Essential (primary) hypertension: Secondary | ICD-10-CM | POA: Diagnosis present

## 2013-04-25 DIAGNOSIS — F17200 Nicotine dependence, unspecified, uncomplicated: Secondary | ICD-10-CM | POA: Diagnosis present

## 2013-04-25 DIAGNOSIS — K8000 Calculus of gallbladder with acute cholecystitis without obstruction: Principal | ICD-10-CM | POA: Diagnosis present

## 2013-04-25 DIAGNOSIS — E7800 Pure hypercholesterolemia, unspecified: Secondary | ICD-10-CM | POA: Diagnosis present

## 2013-04-25 DIAGNOSIS — K810 Acute cholecystitis: Secondary | ICD-10-CM

## 2013-04-25 DIAGNOSIS — K219 Gastro-esophageal reflux disease without esophagitis: Secondary | ICD-10-CM | POA: Diagnosis present

## 2013-04-25 LAB — CBC WITH DIFFERENTIAL/PLATELET
Basophils Absolute: 0 10*3/uL (ref 0.0–0.1)
Eosinophils Relative: 3 % (ref 0–5)
Hemoglobin: 13.9 g/dL (ref 12.0–15.0)
Lymphocytes Relative: 39 % (ref 12–46)
Lymphs Abs: 4.2 10*3/uL — ABNORMAL HIGH (ref 0.7–4.0)
MCH: 31.4 pg (ref 26.0–34.0)
MCHC: 34.8 g/dL (ref 30.0–36.0)
Monocytes Absolute: 0.9 10*3/uL (ref 0.1–1.0)
Neutro Abs: 5.4 10*3/uL (ref 1.7–7.7)
Neutrophils Relative %: 50 % (ref 43–77)
RDW: 12.9 % (ref 11.5–15.5)

## 2013-04-25 LAB — URINALYSIS, ROUTINE W REFLEX MICROSCOPIC
Bilirubin Urine: NEGATIVE
Leukocytes, UA: NEGATIVE
Protein, ur: NEGATIVE mg/dL
Specific Gravity, Urine: 1.025 (ref 1.005–1.030)
pH: 6 (ref 5.0–8.0)

## 2013-04-25 LAB — LIPASE, BLOOD: Lipase: 31 U/L (ref 11–59)

## 2013-04-25 LAB — COMPREHENSIVE METABOLIC PANEL
Albumin: 4.3 g/dL (ref 3.5–5.2)
Alkaline Phosphatase: 54 U/L (ref 39–117)
BUN: 13 mg/dL (ref 6–23)
Chloride: 100 mEq/L (ref 96–112)
Potassium: 3.4 mEq/L — ABNORMAL LOW (ref 3.5–5.1)
Total Bilirubin: 0.3 mg/dL (ref 0.3–1.2)

## 2013-04-25 MED ORDER — TRAZODONE HCL 50 MG PO TABS
100.0000 mg | ORAL_TABLET | Freq: Every day | ORAL | Status: DC
  Filled 2013-04-25: qty 2
  Filled 2013-04-25: qty 1

## 2013-04-25 MED ORDER — POTASSIUM CHLORIDE 10 MEQ/100ML IV SOLN
10.0000 meq | INTRAVENOUS | Status: AC
  Administered 2013-04-25 (×4): 10 meq via INTRAVENOUS
  Filled 2013-04-25: qty 200
  Filled 2013-04-25 (×2): qty 100

## 2013-04-25 MED ORDER — PANTOPRAZOLE SODIUM 40 MG IV SOLR
40.0000 mg | Freq: Every day | INTRAVENOUS | Status: DC
  Administered 2013-04-25: 40 mg via INTRAVENOUS
  Filled 2013-04-25: qty 40

## 2013-04-25 MED ORDER — DIPHENHYDRAMINE HCL 12.5 MG/5ML PO ELIX: 12.5000 mg | ORAL_SOLUTION | Freq: Four times a day (QID) | ORAL | Status: DC | PRN

## 2013-04-25 MED ORDER — SODIUM CHLORIDE 0.9 % IV SOLN
INTRAVENOUS | Status: DC
  Administered 2013-04-25: 1000 mL via INTRAVENOUS
  Administered 2013-04-25: 05:00:00 via INTRAVENOUS

## 2013-04-25 MED ORDER — DIPHENHYDRAMINE HCL 50 MG/ML IJ SOLN: 12.5000 mg | Freq: Four times a day (QID) | INTRAMUSCULAR | Status: DC | PRN

## 2013-04-25 MED ORDER — MORPHINE SULFATE 4 MG/ML IJ SOLN
4.0000 mg | INTRAMUSCULAR | Status: DC | PRN
  Administered 2013-04-25: 4 mg via INTRAVENOUS
  Filled 2013-04-25: qty 1

## 2013-04-25 MED ORDER — PROMETHAZINE HCL 25 MG/ML IJ SOLN
12.5000 mg | Freq: Once | INTRAMUSCULAR | Status: AC
  Administered 2013-04-25: 12.5 mg via INTRAVENOUS
  Filled 2013-04-25: qty 1

## 2013-04-25 MED ORDER — ONDANSETRON HCL 4 MG/2ML IJ SOLN: 4.0000 mg | INTRAMUSCULAR | Status: DC | PRN

## 2013-04-25 MED ORDER — LAMOTRIGINE 100 MG PO TABS
200.0000 mg | ORAL_TABLET | Freq: Every day | ORAL | Status: DC
  Administered 2013-04-25 – 2013-04-26 (×2): 200 mg via ORAL
  Filled 2013-04-25 (×5): qty 2

## 2013-04-25 MED ORDER — IOHEXOL 300 MG/ML  SOLN
100.0000 mL | Freq: Once | INTRAMUSCULAR | Status: AC | PRN
  Administered 2013-04-25: 100 mL via INTRAVENOUS

## 2013-04-25 MED ORDER — ACETAMINOPHEN 325 MG PO TABS: 650.0000 mg | ORAL_TABLET | Freq: Four times a day (QID) | ORAL | Status: DC | PRN

## 2013-04-25 MED ORDER — ATENOLOL 25 MG PO TABS
25.0000 mg | ORAL_TABLET | Freq: Every day | ORAL | Status: DC
  Administered 2013-04-25 – 2013-04-26 (×2): 25 mg via ORAL
  Filled 2013-04-25 (×2): qty 1

## 2013-04-25 MED ORDER — SODIUM CHLORIDE 0.9 % IV SOLN
3.0000 g | Freq: Four times a day (QID) | INTRAVENOUS | Status: DC
  Administered 2013-04-25 – 2013-04-26 (×5): 3 g via INTRAVENOUS
  Filled 2013-04-25 (×17): qty 3

## 2013-04-25 MED ORDER — ALPRAZOLAM 1 MG PO TABS: 1.0000 mg | ORAL_TABLET | Freq: Three times a day (TID) | ORAL | Status: DC | PRN

## 2013-04-25 MED ORDER — PANTOPRAZOLE SODIUM 40 MG IV SOLR
40.0000 mg | Freq: Once | INTRAVENOUS | Status: AC
  Administered 2013-04-25: 40 mg via INTRAVENOUS
  Filled 2013-04-25: qty 40

## 2013-04-25 MED ORDER — ONDANSETRON HCL 4 MG/2ML IJ SOLN
INTRAMUSCULAR | Status: AC
  Filled 2013-04-25: qty 2

## 2013-04-25 MED ORDER — KCL IN DEXTROSE-NACL 20-5-0.45 MEQ/L-%-% IV SOLN
INTRAVENOUS | Status: DC
  Administered 2013-04-25 – 2013-04-26 (×2): via INTRAVENOUS
  Filled 2013-04-25 (×3): qty 1000

## 2013-04-25 MED ORDER — FENTANYL CITRATE 0.05 MG/ML IJ SOLN
100.0000 ug | INTRAMUSCULAR | Status: DC | PRN
  Administered 2013-04-25: 100 ug via INTRAVENOUS
  Filled 2013-04-25: qty 2

## 2013-04-25 MED ORDER — HYDROMORPHONE HCL PF 1 MG/ML IJ SOLN
1.0000 mg | INTRAMUSCULAR | Status: DC | PRN
  Administered 2013-04-25 – 2013-04-26 (×6): 1 mg via INTRAVENOUS
  Filled 2013-04-25 (×7): qty 1

## 2013-04-25 MED ORDER — HYDROCHLOROTHIAZIDE 25 MG PO TABS
12.5000 mg | ORAL_TABLET | Freq: Every day | ORAL | Status: DC
  Administered 2013-04-25 – 2013-04-26 (×2): 12.5 mg via ORAL
  Filled 2013-04-25: qty 1
  Filled 2013-04-25 (×2): qty 0.5

## 2013-04-25 MED ORDER — NICOTINE 21 MG/24HR TD PT24
21.0000 mg | MEDICATED_PATCH | Freq: Every day | TRANSDERMAL | Status: DC
  Administered 2013-04-25 – 2013-04-26 (×2): 21 mg via TRANSDERMAL
  Filled 2013-04-25 (×2): qty 1

## 2013-04-25 MED ORDER — CARBAMAZEPINE 200 MG PO TABS
400.0000 mg | ORAL_TABLET | Freq: Every day | ORAL | Status: DC
  Administered 2013-04-25 – 2013-04-26 (×2): 400 mg via ORAL
  Filled 2013-04-25 (×2): qty 2

## 2013-04-25 MED ORDER — IOHEXOL 300 MG/ML  SOLN
50.0000 mL | Freq: Once | INTRAMUSCULAR | Status: AC | PRN
  Administered 2013-04-25: 50 mL via ORAL

## 2013-04-25 MED ORDER — ALBUTEROL SULFATE HFA 108 (90 BASE) MCG/ACT IN AERS
1.0000 | INHALATION_SPRAY | Freq: Four times a day (QID) | RESPIRATORY_TRACT | Status: DC | PRN
  Administered 2013-04-25: 2 via RESPIRATORY_TRACT
  Filled 2013-04-25: qty 6.7

## 2013-04-25 MED ORDER — ONDANSETRON HCL 4 MG/2ML IJ SOLN
4.0000 mg | Freq: Once | INTRAMUSCULAR | Status: AC
  Administered 2013-04-25: 4 mg via INTRAVENOUS
  Filled 2013-04-25: qty 2

## 2013-04-25 MED ORDER — ONDANSETRON HCL 4 MG/2ML IJ SOLN
4.0000 mg | Freq: Four times a day (QID) | INTRAMUSCULAR | Status: DC | PRN
  Administered 2013-04-25: 4 mg via INTRAVENOUS
  Filled 2013-04-25: qty 2

## 2013-04-25 MED ORDER — ESCITALOPRAM OXALATE 10 MG PO TABS
10.0000 mg | ORAL_TABLET | Freq: Every day | ORAL | Status: DC
  Administered 2013-04-25: 10 mg via ORAL
  Filled 2013-04-25 (×2): qty 1

## 2013-04-25 MED ORDER — PNEUMOCOCCAL VAC POLYVALENT 25 MCG/0.5ML IJ INJ
0.5000 mL | INJECTION | INTRAMUSCULAR | Status: AC
  Administered 2013-04-26: 0.5 mL via INTRAMUSCULAR
  Filled 2013-04-25: qty 0.5

## 2013-04-25 MED ORDER — ENOXAPARIN SODIUM 40 MG/0.4ML ~~LOC~~ SOLN
40.0000 mg | SUBCUTANEOUS | Status: DC
  Administered 2013-04-25 – 2013-04-26 (×2): 40 mg via SUBCUTANEOUS
  Filled 2013-04-25: qty 0.4

## 2013-04-25 MED ORDER — SODIUM CHLORIDE 0.9 % IV SOLN
1.0000 g | Freq: Once | INTRAVENOUS | Status: AC
  Administered 2013-04-25: 1 g via INTRAVENOUS
  Filled 2013-04-25: qty 1

## 2013-04-25 MED ORDER — SODIUM CHLORIDE 0.9 % IV SOLN
Freq: Once | INTRAVENOUS | Status: AC
  Administered 2013-04-25: 03:00:00 via INTRAVENOUS

## 2013-04-25 MED ORDER — FAMOTIDINE IN NACL 20-0.9 MG/50ML-% IV SOLN
20.0000 mg | Freq: Once | INTRAVENOUS | Status: AC
  Administered 2013-04-25: 20 mg via INTRAVENOUS
  Filled 2013-04-25: qty 50

## 2013-04-25 MED ORDER — CHLORHEXIDINE GLUCONATE 4 % EX LIQD
1.0000 "application " | Freq: Once | CUTANEOUS | Status: AC
  Administered 2013-04-25: 1 via TOPICAL
  Filled 2013-04-25 (×2): qty 15

## 2013-04-25 MED ORDER — ACETAMINOPHEN 650 MG RE SUPP: 650.0000 mg | Freq: Four times a day (QID) | RECTAL | Status: DC | PRN

## 2013-04-25 MED ORDER — ONDANSETRON HCL 4 MG/2ML IJ SOLN
4.0000 mg | Freq: Once | INTRAMUSCULAR | Status: AC
  Administered 2013-04-25: 4 mg via INTRAVENOUS

## 2013-04-25 NOTE — ED Notes (Signed)
Receiving nurse in room with pt on contact precautions.  Will call back for report.

## 2013-04-25 NOTE — ED Notes (Signed)
Pt says uses proair inhaler qid.  Pt requesting to use it here.  Paging Dr. Lovell Sheehan.  No SOB, no wheezing noted at this time.  VSS.

## 2013-04-25 NOTE — ED Notes (Signed)
Pt is anxious and crying in pain

## 2013-04-25 NOTE — ED Notes (Signed)
Sprite given to drink 

## 2013-04-25 NOTE — Progress Notes (Signed)
UR Chart Review Completed  

## 2013-04-25 NOTE — ED Provider Notes (Signed)
CSN: [id number]     Arrival date & time 04/25/13  0208 History     First MD Initiated Contact with Patient 04/25/13 0401     Chief Complaint  Patient presents with  . Abdominal Pain  . Emesis    HPI Pt was seen at 0410.  Per pt, c/o gradual onset and persistence of constant acute flair of her chronic upper abd "pain" for the past 1 to 2 months, worse over the past several days. Has been associated with multiple intermittent episodes of N/V.  Describes the abd pain as "sharp" and "stabbing."  States she has gallstones per an outpatient abd US performed on 04/23/13, and has an appt with General Surgeon in [place] next month to discuss cholecystectomy. Denies diarrhea, no fevers, no back pain, no rash, no CP/SOB, no black or blood in stools or emesis.       Past Medical History  Diagnosis Date  . Hypertension   . Hypercholesterolemia   . GERD (gastroesophageal reflux disease)   . Back pain   . Bipolar 1 disorder   . Bulging disc   . Degenerative disc disease    Past Surgical History  Procedure Laterality Date  . Carpal tunnel release    . Tubal ligation     Family History  Problem Relation Age of Onset  . Diabetes Father    History  Substance Use Topics  . Smoking status: Current Every Day Smoker -- 1.00 packs/day for 20 years    Types: Cigarettes  . Smokeless tobacco: Never Used  . Alcohol Use: No   OB History   Grav Para Term Preterm Abortions TAB SAB Ect Mult Living   11 2 2  9  9   2      Review of Systems ROS: Statement: All systems negative except as marked or noted in the HPI; Constitutional: Negative for fever and chills. ; ; Eyes: Negative for eye pain, redness and discharge. ; ; ENMT: Negative for ear pain, hoarseness, nasal congestion, sinus pressure and sore throat. ; ; Cardiovascular: Negative for chest pain, palpitations, diaphoresis, dyspnea and peripheral edema. ; ; Respiratory: Negative for cough, wheezing and stridor. ; ; Gastrointestinal: +N/V, abd pain.  Negative for diarrhea, blood in stool, hematemesis, jaundice and rectal bleeding. . ; ; Genitourinary: Negative for dysuria, flank pain and hematuria. ; ; Musculoskeletal: Negative for back pain and neck pain. Negative for swelling and trauma.; ; Skin: Negative for pruritus, rash, abrasions, blisters, bruising and skin lesion.; ; Neuro: Negative for headache, lightheadedness and neck stiffness. Negative for weakness, altered level of consciousness , altered mental status, extremity weakness, paresthesias, involuntary movement, seizure and syncope.       Allergies  Clarithromycin and Darvocet  Home Medications   Current Outpatient Rx  Name  Route  Sig  Dispense  Refill  . Ascorbic Acid (VITAMIN C PO)   Oral   Take 1 tablet by mouth daily.         Marland Kitchen atenolol (TENORMIN) 50 MG tablet   Oral   Take 25 mg by mouth daily. For HIGH BLOOD PRESSURE         . carbamazepine (TEGRETOL) 200 MG tablet   Oral   Take 400 mg by mouth daily. for MOOD SWINGS         . cetirizine (ZYRTEC) 10 MG tablet   Oral   Take 10 mg by mouth daily. For ALLERGIES         . Cholecalciferol (VITAMIN D) 2000 UNITS  CAPS   Oral   Take 2 capsules by mouth daily.         Marland Kitchen escitalopram (LEXAPRO) 10 MG tablet   Oral   Take 10 mg by mouth daily. For DEPRESSION         . hydrochlorothiazide (HYDRODIURIL) 25 MG tablet   Oral   Take 12.5 mg by mouth daily. For FLUID         . HYDROcodone-acetaminophen (NORCO) 10-325 MG per tablet   Oral   Take 1 tablet by mouth every 6 (six) hours as needed. FOR PAIN         . lamoTRIgine (LAMICTAL) 200 MG tablet   Oral   Take 200 mg by mouth daily. For BIPOLAR         . omeprazole (PRILOSEC) 20 MG capsule   Oral   Take 20 mg by mouth 2 (two) times daily. For GERD         . tiZANidine (ZANAFLEX) 4 MG tablet   Oral   Take 4 mg by mouth 3 (three) times daily. MUSCLE SPASMS         . traMADol (ULTRAM) 50 MG tablet   Oral   Take 50 mg by mouth every 6  (six) hours as needed. PAIN         . ALPRAZolam (XANAX) 1 MG tablet   Oral   Take 1 mg by mouth 3 (three) times daily as needed. For ANXIETY         . Choline Fenofibrate 135 MG capsule   Oral   Take 135 mg by mouth daily. For HIGH TRIGLYCERIDES         . methocarbamol (ROBAXIN) 500 MG tablet      Take two tablets po TID prn muscle spasms   42 tablet   0   . traZODone (DESYREL) 100 MG tablet   Oral   Take 100 mg by mouth at bedtime. For SLEEP          BP 131/82  Pulse 68  Temp(Src) 98.2 F (36.8 C) (Oral)  Resp 20  Ht 5\' 5"  (1.651 m)  Wt 235 lb (106.595 kg)  BMI 39.11 kg/m2  SpO2 100%  LMP 03/25/2013 Physical Exam 0415: Physical examination:  Nursing notes reviewed; Vital signs and O2 SAT reviewed;  Constitutional: Well developed, Well nourished, Well hydrated, Uncomfortable. ; Head:  Normocephalic, atraumatic; Eyes: EOMI, PERRL, No scleral icterus; ENMT: Mouth and pharynx normal, Mucous membranes moist; Neck: Supple, Full range of motion, No lymphadenopathy; Cardiovascular: Regular rate and rhythm, No murmur, rub, or gallop; Respiratory: Breath sounds clear & equal bilaterally, No rales, rhonchi, wheezes.  Speaking full sentences with ease, Normal respiratory effort/excursion; Chest: Nontender, Movement normal; Abdomen: Soft, +RUQ and mid-epigastric areas tender to palp. +gagging and retching into emesis bag during exam. Nondistended, Normal bowel sounds; Genitourinary: No CVA tenderness; Extremities: Pulses normal, No tenderness, No edema, No calf edema or asymmetry.; Neuro: AA&Ox3, Major CN grossly intact.  Speech clear. No gross focal motor or sensory deficits in extremities.; Skin: Color normal, Warm, Dry.   ED Course   Procedures     MDM  MDM Reviewed: previous chart, nursing note and vitals Reviewed previous: labs and ultrasound Interpretation: labs, x-ray and CT scan   Results for orders placed during the hospital encounter of 04/25/13  URINALYSIS,  ROUTINE W REFLEX MICROSCOPIC      Result Value Range   Color, Urine YELLOW  YELLOW   APPearance CLEAR  CLEAR   Specific  Gravity, Urine 1.025  1.005 - 1.030   pH 6.0  5.0 - 8.0   Glucose, UA NEGATIVE  NEGATIVE mg/dL   Hgb urine dipstick NEGATIVE  NEGATIVE   Bilirubin Urine NEGATIVE  NEGATIVE   Ketones, ur TRACE (*) NEGATIVE mg/dL   Protein, ur NEGATIVE  NEGATIVE mg/dL   Urobilinogen, UA 0.2  0.0 - 1.0 mg/dL   Nitrite NEGATIVE  NEGATIVE   Leukocytes, UA NEGATIVE  NEGATIVE  PREGNANCY, URINE      Result Value Range   Preg Test, Ur NEGATIVE  NEGATIVE  CBC WITH DIFFERENTIAL      Result Value Range   WBC 10.8 (*) 4.0 - 10.5 K/uL   RBC 4.43  3.87 - 5.11 MIL/uL   Hemoglobin 13.9  12.0 - 15.0 g/dL   HCT 16.1  09.6 - 04.5 %   MCV 90.3  78.0 - 100.0 fL   MCH 31.4  26.0 - 34.0 pg   MCHC 34.8  30.0 - 36.0 g/dL   RDW 40.9  81.1 - 91.4 %   Platelets 382  150 - 400 K/uL   Neutrophils Relative % 50  43 - 77 %   Neutro Abs 5.4  1.7 - 7.7 K/uL   Lymphocytes Relative 39  12 - 46 %   Lymphs Abs 4.2 (*) 0.7 - 4.0 K/uL   Monocytes Relative 8  3 - 12 %   Monocytes Absolute 0.9  0.1 - 1.0 K/uL   Eosinophils Relative 3  0 - 5 %   Eosinophils Absolute 0.3  0.0 - 0.7 K/uL   Basophils Relative 0  0 - 1 %   Basophils Absolute 0.0  0.0 - 0.1 K/uL  COMPREHENSIVE METABOLIC PANEL      Result Value Range   Sodium 136  135 - 145 mEq/L   Potassium 3.4 (*) 3.5 - 5.1 mEq/L   Chloride 100  96 - 112 mEq/L   CO2 24  19 - 32 mEq/L   Glucose, Bld 105 (*) 70 - 99 mg/dL   BUN 13  6 - 23 mg/dL   Creatinine, Ser 7.82  0.50 - 1.10 mg/dL   Calcium 95.6  8.4 - 21.3 mg/dL   Total Protein 7.7  6.0 - 8.3 g/dL   Albumin 4.3  3.5 - 5.2 g/dL   AST 20  0 - 37 U/L   ALT 20  0 - 35 U/L   Alkaline Phosphatase 54  39 - 117 U/L   Total Bilirubin 0.3  0.3 - 1.2 mg/dL   GFR calc non Af Amer >90  >90 mL/min   GFR calc Af Amer >90  >90 mL/min  LIPASE, BLOOD      Result Value Range   Lipase 31  11 - 59 U/L   US Abdomen  Limited Ruq 04/23/2013   *RADIOLOGY REPORT*  Ultrasound right upper quadrant  History:  Right upper quadrant pain and nausea  Comparison:  None  Findings:  Within the gallbladder, there is an echogenic focus measuring 4.6 cm in length which moves and shadows consistent with cholelithiasis.  There is no gallbladder wall thickening or pericholecystic fluid collection.  There is no intrahepatic or extrahepatic biliary duct dilatation.  Liver shows increased echogenicity consistent with fatty change. No focal liver lesions are identified.  Flow in the portal vein is in the anatomic direction.  Conclusion:  Cholelithiasis.  Fatty liver.  While no focal liver lesions are identified, it must be cautioned that the sensitivity of ultrasound for focal  liver lesions is diminished given underlying fatty change.   Original Report Authenticated By: Bretta Bang, M.D.   Dg Chest 2 View 04/25/2013   *RADIOLOGY REPORT*  Clinical Data: Emesis.  CHEST - 2 VIEW  Comparison: PA and lateral chest 12/27/2010.  Findings: Lungs are clear.  There is some peribronchial thickening. No pneumothorax or pleural fluid.  Heart size normal.  IMPRESSION: Bronchitic change without focal process.   Original Report Authenticated By: Holley Dexter, M.D.   Ct Abdomen Pelvis W Contrast 04/25/2013   *RADIOLOGY REPORT*  Clinical Data: Abdominal pain and vomiting.  CT ABDOMEN AND PELVIS WITH CONTRAST  Technique:  Multidetector CT imaging of the abdomen and pelvis was performed following the standard protocol during bolus administration of intravenous contrast.  Contrast: 50mL OMNIPAQUE IOHEXOL 300 MG/ML  SOLN, OMNIPAQUE IOHEXOL 300 MG/ML  SOLN  Comparison: None.  Findings: The lung bases are clear.  No pleural or pericardial effusion.  A 2.4 cm diameter stone is identified in the neck of the gallbladder.  The gallbladder is distended.  The liver, spleen, adrenal glands, pancreas, biliary tree and right kidney appear normal.  A 1.1 cm in diameter  low attenuation lesion in the right kidney is most consistent with a cyst.  Uterus, adnexa and urinary bladder are unremarkable.  Trace amount of free pelvic fluid is consistent with physiologic change.  There is sigmoid diverticulosis without diverticulitis.  The colon is otherwise unremarkable.  The stomach, small bowel and appendix appear normal.  No lymphadenopathy is identified.  No focal bony abnormality is seen with degenerative disc disease from L3-4 to L5- S1 noted.  IMPRESSION:  1.  2.4 cm gallstone is in the neck of the gallbladder and the gallbladder is distended.  If there is clinical concern for cholecystitis, ultrasound would be useful for further evaluation. 2.  Sigmoid diverticulosis without diverticulitis. 3.  Lower lumbar degenerative disc disease.   Original Report Authenticated By: Holley Dexter, M.D.    [id number]:  Pt continues to have pain and N/V despite multiple doses of IV meds. CT scan with gallstone in the neck of the gallbladder with GB distention. Will tx for acute cholecystitis. Dx and testing d/w pt.  Questions answered.  Verb understanding, agreeable to admit.  T/C to General Surgery Dr. Lovell Sheehan, case discussed, including:  HPI, pertinent PM/SHx, VS/PE, dx testing, ED course and treatment:  Agreeable to admit, requests he will come to ED for eval.    Laray Anger, DO 04/26/13 [id number]

## 2013-04-25 NOTE — ED Notes (Signed)
Waiting on additional meds from pharmacy to be given, ordered for daily. To start potassium IVPB once UNASYN is transfused.

## 2013-04-25 NOTE — ED Notes (Signed)
Pt denies pain and nausea at this time.

## 2013-04-25 NOTE — ED Notes (Signed)
Hx of gallbladder disease, supposed to be following with a surgeon next week.

## 2013-04-25 NOTE — ED Notes (Signed)
Clear liquid tray given

## 2013-04-25 NOTE — ED Notes (Signed)
CLear liquid meal tray ordered for pt.  Pt dozing off to sleep.

## 2013-04-25 NOTE — ED Notes (Signed)
Mother at the bedside. State pt was complaining of abd pain and passed out, pt is vomiting at bedside. Orders given.

## 2013-04-25 NOTE — ED Notes (Signed)
Pt c/o pain to abd, rates at 8.  Morphine given.

## 2013-04-25 NOTE — ED Notes (Signed)
Pt resting quietly with eyes closed.  Awaiting bed assignment.

## 2013-04-25 NOTE — ED Notes (Signed)
Pt ambulated to restroom. NAD

## 2013-04-25 NOTE — ED Notes (Signed)
Pt comes in by EMS, abdominal pain, vomiting. Has appointment with surgery on Aug 21st to discuss Gallbladder removed.

## 2013-04-25 NOTE — H&P (Signed)
Deborah Barnes is an 37 y.o. female.   Chief Complaint: Right upper corner abdominal pain HPI: Patient is a 37 year old white female who presents to the emergency room with worsening right upper quadrant abdominal pain, nausea, vomiting. She has had intermittent biliary colic over the past few days. Ultrasound done on 04/23/2013 as an outpatient revealed cholelithiasis with a normal common bile duct. There was also evidence of fatty liver. She presented again today to the emergency room due to worsening pain. She was scheduled to see a Careers adviser at [facility] later in the month. She denies any fever, chills, or jaundice.  Past Medical History  Diagnosis Date  . Hypertension   . Hypercholesterolemia   . GERD (gastroesophageal reflux disease)   . Back pain   . Bipolar 1 disorder   . Bulging disc   . Degenerative disc disease     Past Surgical History  Procedure Laterality Date  . Carpal tunnel release    . Tubal ligation      Family History  Problem Relation Age of Onset  . Diabetes Father    Social History:  reports that she has been smoking Cigarettes.  She has a 20 pack-year smoking history. She has never used smokeless tobacco. She reports that she does not drink alcohol or use illicit drugs.  Allergies:  Allergies  Allergen Reactions  . Clarithromycin Diarrhea  . Darvocet (Propoxyphene-Acetaminophen)      (Not in a hospital admission)  Results for orders placed during the hospital encounter of 04/25/13 (from the past 48 hour(s))  CBC WITH DIFFERENTIAL     Status: Abnormal   Collection Time    04/25/13  3:18 AM      Result Value Range   WBC 10.8 (*) 4.0 - 10.5 K/uL   RBC 4.43  3.87 - 5.11 MIL/uL   Hemoglobin 13.9  12.0 - 15.0 g/dL   HCT 16.1  09.6 - 04.5 %   MCV 90.3  78.0 - 100.0 fL   MCH 31.4  26.0 - 34.0 pg   MCHC 34.8  30.0 - 36.0 g/dL   RDW 40.9  81.1 - 91.4 %   Platelets 382  150 - 400 K/uL   Neutrophils Relative % 50  43 - 77 %   Neutro Abs  5.4  1.7 - 7.7 K/uL   Lymphocytes Relative 39  12 - 46 %   Lymphs Abs 4.2 (*) 0.7 - 4.0 K/uL   Monocytes Relative 8  3 - 12 %   Monocytes Absolute 0.9  0.1 - 1.0 K/uL   Eosinophils Relative 3  0 - 5 %   Eosinophils Absolute 0.3  0.0 - 0.7 K/uL   Basophils Relative 0  0 - 1 %   Basophils Absolute 0.0  0.0 - 0.1 K/uL  COMPREHENSIVE METABOLIC PANEL     Status: Abnormal   Collection Time    04/25/13  3:18 AM      Result Value Range   Sodium 136  135 - 145 mEq/L   Potassium 3.4 (*) 3.5 - 5.1 mEq/L   Chloride 100  96 - 112 mEq/L   CO2 24  19 - 32 mEq/L   Glucose, Bld 105 (*) 70 - 99 mg/dL   BUN 13  6 - 23 mg/dL   Creatinine, Ser 7.82  0.50 - 1.10 mg/dL   Calcium 95.6  8.4 - 21.3 mg/dL   Total Protein 7.7  6.0 - 8.3 g/dL   Albumin 4.3  3.5 - 5.2  g/dL   AST 20  0 - 37 U/L   ALT 20  0 - 35 U/L   Alkaline Phosphatase 54  39 - 117 U/L   Total Bilirubin 0.3  0.3 - 1.2 mg/dL   GFR calc non Af Amer >90  >90 mL/min   GFR calc Af Amer >90  >90 mL/min   Comment:            The eGFR has been calculated     using the CKD EPI equation.     This calculation has not been     validated in all clinical     situations.     eGFR's persistently     <90 mL/min signify     possible Chronic Kidney Disease.  LIPASE, BLOOD     Status: None   Collection Time    04/25/13  3:18 AM      Result Value Range   Lipase 31  11 - 59 U/L  URINALYSIS, ROUTINE W REFLEX MICROSCOPIC     Status: Abnormal   Collection Time    04/25/13  4:20 AM      Result Value Range   Color, Urine YELLOW  YELLOW   APPearance CLEAR  CLEAR   Specific Gravity, Urine 1.025  1.005 - 1.030   pH 6.0  5.0 - 8.0   Glucose, UA NEGATIVE  NEGATIVE mg/dL   Hgb urine dipstick NEGATIVE  NEGATIVE   Bilirubin Urine NEGATIVE  NEGATIVE   Ketones, ur TRACE (*) NEGATIVE mg/dL   Protein, ur NEGATIVE  NEGATIVE mg/dL   Urobilinogen, UA 0.2  0.0 - 1.0 mg/dL   Nitrite NEGATIVE  NEGATIVE   Leukocytes, UA NEGATIVE  NEGATIVE   Comment: MICROSCOPIC NOT  DONE ON URINES WITH NEGATIVE PROTEIN, BLOOD, LEUKOCYTES, NITRITE, OR GLUCOSE <1000 mg/dL.  PREGNANCY, URINE     Status: None   Collection Time    04/25/13  4:20 AM      Result Value Range   Preg Test, Ur NEGATIVE  NEGATIVE   Comment:            THE SENSITIVITY OF THIS     METHODOLOGY IS >20 mIU/mL.   Dg Chest 2 View  04/25/2013   *RADIOLOGY REPORT*  Clinical Data: Emesis.  CHEST - 2 VIEW  Comparison: PA and lateral chest 12/27/2010.  Findings: Lungs are clear.  There is some peribronchial thickening. No pneumothorax or pleural fluid.  Heart size normal.  IMPRESSION: Bronchitic change without focal process.   Original Report Authenticated By: Holley Dexter, M.D.   Ct Abdomen Pelvis W Contrast  04/25/2013   *RADIOLOGY REPORT*  Clinical Data: Abdominal pain and vomiting.  CT ABDOMEN AND PELVIS WITH CONTRAST  Technique:  Multidetector CT imaging of the abdomen and pelvis was performed following the standard protocol during bolus administration of intravenous contrast.  Contrast: 50mL OMNIPAQUE IOHEXOL 300 MG/ML  SOLN, OMNIPAQUE IOHEXOL 300 MG/ML  SOLN  Comparison: None.  Findings: The lung bases are clear.  No pleural or pericardial effusion.  A 2.4 cm diameter stone is identified in the neck of the gallbladder.  The gallbladder is distended.  The liver, spleen, adrenal glands, pancreas, biliary tree and right kidney appear normal.  A 1.1 cm in diameter low attenuation lesion in the right kidney is most consistent with a cyst.  Uterus, adnexa and urinary bladder are unremarkable.  Trace amount of free pelvic fluid is consistent with physiologic change.  There is sigmoid diverticulosis without diverticulitis.  The  colon is otherwise unremarkable.  The stomach, small bowel and appendix appear normal.  No lymphadenopathy is identified.  No focal bony abnormality is seen with degenerative disc disease from L3-4 to L5- S1 noted.  IMPRESSION:  1.  2.4 cm gallstone is in the neck of the gallbladder and the  gallbladder is distended.  If there is clinical concern for cholecystitis, ultrasound would be useful for further evaluation. 2.  Sigmoid diverticulosis without diverticulitis. 3.  Lower lumbar degenerative disc disease.   Original Report Authenticated By: Holley Dexter, M.D.   US Abdomen Limited Ruq  04/23/2013   *RADIOLOGY REPORT*  Ultrasound right upper quadrant  History:  Right upper quadrant pain and nausea  Comparison:  None  Findings:  Within the gallbladder, there is an echogenic focus measuring 4.6 cm in length which moves and shadows consistent with cholelithiasis.  There is no gallbladder wall thickening or pericholecystic fluid collection.  There is no intrahepatic or extrahepatic biliary duct dilatation.  Liver shows increased echogenicity consistent with fatty change. No focal liver lesions are identified.  Flow in the portal vein is in the anatomic direction.  Conclusion:  Cholelithiasis.  Fatty liver.  While no focal liver lesions are identified, it must be cautioned that the sensitivity of ultrasound for focal liver lesions is diminished given underlying fatty change.   Original Report Authenticated By: Bretta Bang, M.D.    Review of Systems  Constitutional: Positive for malaise/fatigue.  HENT: Negative.   Eyes: Negative.   Respiratory: Negative.   Cardiovascular: Negative.   Gastrointestinal: Positive for heartburn, nausea, vomiting and abdominal pain.  Genitourinary: Negative.   Musculoskeletal: Negative.   Skin: Negative.   Endo/Heme/Allergies: Negative.     Blood pressure 131/82, pulse 68, temperature 98.2 F (36.8 C), temperature source Oral, resp. rate 20, height 5\' 5"  (1.651 m), weight 106.595 kg (235 lb), last menstrual period 03/25/2013, SpO2 100.00%. Physical Exam  Constitutional: She is oriented to person, place, and time. She appears well-developed and well-nourished.  HENT:  Head: Normocephalic and atraumatic.  Neck: Normal range of motion. Neck supple.   Cardiovascular: Normal rate, regular rhythm and normal heart sounds.   Respiratory: Effort normal and breath sounds normal.  GI: Soft. There is tenderness. There is no rebound.  Tender in the right upper quadrant to deep palpation. No rigidity noted.  Neurological: She is alert and oriented to person, place, and time.  Skin: Skin is warm and dry.     Assessment/Plan Impression: Cholecystitis, cholelithiasis Plan: Patient admitted to the hospital for control of her pain and nausea. She will be started on IV antibiotics. She subsequently will undergo a laparoscopic cholecystectomy. The risks and benefits of the procedure including bleeding, infection, hepatobiliary, and the possibility of an open procedure were fully explained to the patient, who gave informed consent.  Yarelly Kuba A 04/25/2013, 7:38 AM

## 2013-04-26 ENCOUNTER — Encounter (HOSPITAL_COMMUNITY): Payer: Self-pay | Admitting: Anesthesiology

## 2013-04-26 ENCOUNTER — Encounter (HOSPITAL_COMMUNITY): Payer: Self-pay | Admitting: *Deleted

## 2013-04-26 ENCOUNTER — Encounter (HOSPITAL_COMMUNITY)
Admission: EM | Disposition: A | Discharge status: Home / Self Care | Payer: Self-pay | Source: Home / Self Care | Attending: General Surgery

## 2013-04-26 ENCOUNTER — Inpatient Hospital Stay (HOSPITAL_COMMUNITY): Payer: Medicaid Other | Admitting: Anesthesiology

## 2013-04-26 HISTORY — PX: CHOLECYSTECTOMY: SHX55

## 2013-04-26 SURGERY — LAPAROSCOPIC CHOLECYSTECTOMY
Anesthesia: General | Site: Abdomen | Wound class: Clean Contaminated

## 2013-04-26 MED ORDER — FENTANYL CITRATE 0.05 MG/ML IJ SOLN
INTRAMUSCULAR | Status: DC | PRN
  Administered 2013-04-26 (×8): 50 ug via INTRAVENOUS
  Administered 2013-04-26: 100 ug via INTRAVENOUS

## 2013-04-26 MED ORDER — ONDANSETRON HCL 4 MG/2ML IJ SOLN
INTRAMUSCULAR | Status: AC
  Filled 2013-04-26: qty 2

## 2013-04-26 MED ORDER — FENTANYL CITRATE 0.05 MG/ML IJ SOLN
INTRAMUSCULAR | Status: AC
  Filled 2013-04-26: qty 5

## 2013-04-26 MED ORDER — ROCURONIUM BROMIDE 50 MG/5ML IV SOLN
INTRAVENOUS | Status: AC
  Filled 2013-04-26: qty 1

## 2013-04-26 MED ORDER — MIDAZOLAM HCL 2 MG/2ML IJ SOLN
INTRAMUSCULAR | Status: AC
  Filled 2013-04-26: qty 2

## 2013-04-26 MED ORDER — ENOXAPARIN SODIUM 40 MG/0.4ML ~~LOC~~ SOLN
SUBCUTANEOUS | Status: AC
  Filled 2013-04-26: qty 0.4

## 2013-04-26 MED ORDER — HEMOSTATIC AGENTS (NO CHARGE) OPTIME
TOPICAL | Status: DC | PRN
  Administered 2013-04-26: 1 via TOPICAL

## 2013-04-26 MED ORDER — GLYCOPYRROLATE 0.2 MG/ML IJ SOLN
INTRAMUSCULAR | Status: DC | PRN
  Administered 2013-04-26: 0.4 mg via INTRAVENOUS

## 2013-04-26 MED ORDER — KETOROLAC TROMETHAMINE 30 MG/ML IJ SOLN
30.0000 mg | Freq: Once | INTRAMUSCULAR | Status: AC
  Administered 2013-04-26: 30 mg via INTRAVENOUS

## 2013-04-26 MED ORDER — LIDOCAINE HCL (CARDIAC) 10 MG/ML IV SOLN
INTRAVENOUS | Status: DC | PRN
  Administered 2013-04-26: 20 mg via INTRAVENOUS

## 2013-04-26 MED ORDER — SUCCINYLCHOLINE CHLORIDE 20 MG/ML IJ SOLN
INTRAMUSCULAR | Status: AC
  Filled 2013-04-26: qty 1

## 2013-04-26 MED ORDER — OXYCODONE-ACETAMINOPHEN 7.5-325 MG PO TABS: 1.0000 | ORAL_TABLET | ORAL | Status: DC | PRN

## 2013-04-26 MED ORDER — FENTANYL CITRATE 0.05 MG/ML IJ SOLN
INTRAMUSCULAR | Status: AC
  Filled 2013-04-26: qty 2

## 2013-04-26 MED ORDER — FENTANYL CITRATE 0.05 MG/ML IJ SOLN
25.0000 ug | INTRAMUSCULAR | Status: DC | PRN
  Administered 2013-04-26 (×4): 50 ug via INTRAVENOUS

## 2013-04-26 MED ORDER — DEXAMETHASONE SODIUM PHOSPHATE 4 MG/ML IJ SOLN
4.0000 mg | Freq: Once | INTRAMUSCULAR | Status: AC
  Administered 2013-04-26: 4 mg via INTRAVENOUS

## 2013-04-26 MED ORDER — GLYCOPYRROLATE 0.2 MG/ML IJ SOLN
INTRAMUSCULAR | Status: AC
  Filled 2013-04-26: qty 1

## 2013-04-26 MED ORDER — ROCURONIUM BROMIDE 100 MG/10ML IV SOLN
INTRAVENOUS | Status: DC | PRN
  Administered 2013-04-26: 5 mg via INTRAVENOUS
  Administered 2013-04-26: 25 mg via INTRAVENOUS

## 2013-04-26 MED ORDER — GLYCOPYRROLATE 0.2 MG/ML IJ SOLN
INTRAMUSCULAR | Status: AC
  Filled 2013-04-26: qty 2

## 2013-04-26 MED ORDER — DEXAMETHASONE SODIUM PHOSPHATE 4 MG/ML IJ SOLN
INTRAMUSCULAR | Status: AC
  Filled 2013-04-26: qty 1

## 2013-04-26 MED ORDER — SODIUM CHLORIDE 0.9 % IR SOLN
Status: DC | PRN
  Administered 2013-04-26: 1000 mL

## 2013-04-26 MED ORDER — MIDAZOLAM HCL 2 MG/2ML IJ SOLN
1.0000 mg | INTRAMUSCULAR | Status: AC | PRN
  Administered 2013-04-26 (×3): 2 mg via INTRAVENOUS

## 2013-04-26 MED ORDER — LACTATED RINGERS IV SOLN
INTRAVENOUS | Status: DC
  Administered 2013-04-26: 1000 mL via INTRAVENOUS
  Administered 2013-04-26: 11:00:00 via INTRAVENOUS

## 2013-04-26 MED ORDER — ONDANSETRON HCL 4 MG/2ML IJ SOLN: 4.0000 mg | Freq: Once | INTRAMUSCULAR | Status: DC | PRN

## 2013-04-26 MED ORDER — BUPIVACAINE HCL (PF) 0.5 % IJ SOLN
INTRAMUSCULAR | Status: DC | PRN
  Administered 2013-04-26: 10 mL

## 2013-04-26 MED ORDER — BUPIVACAINE HCL (PF) 0.5 % IJ SOLN
INTRAMUSCULAR | Status: AC
  Filled 2013-04-26: qty 30

## 2013-04-26 MED ORDER — PROPOFOL 10 MG/ML IV EMUL
INTRAVENOUS | Status: AC
  Filled 2013-04-26: qty 20

## 2013-04-26 MED ORDER — SUCCINYLCHOLINE CHLORIDE 20 MG/ML IJ SOLN
INTRAMUSCULAR | Status: DC | PRN
  Administered 2013-04-26: 120 mg via INTRAVENOUS

## 2013-04-26 MED ORDER — GLYCOPYRROLATE 0.2 MG/ML IJ SOLN
0.2000 mg | Freq: Once | INTRAMUSCULAR | Status: AC
  Administered 2013-04-26: 0.2 mg via INTRAVENOUS

## 2013-04-26 MED ORDER — KETOROLAC TROMETHAMINE 30 MG/ML IJ SOLN
INTRAMUSCULAR | Status: AC
  Filled 2013-04-26: qty 1

## 2013-04-26 MED ORDER — NEOSTIGMINE METHYLSULFATE 1 MG/ML IJ SOLN
INTRAMUSCULAR | Status: DC | PRN
  Administered 2013-04-26: 2 mg via INTRAVENOUS

## 2013-04-26 MED ORDER — PROPOFOL 10 MG/ML IV BOLUS
INTRAVENOUS | Status: DC | PRN
  Administered 2013-04-26: 50 mg via INTRAVENOUS
  Administered 2013-04-26: 150 mg via INTRAVENOUS

## 2013-04-26 MED ORDER — ONDANSETRON HCL 4 MG/2ML IJ SOLN
4.0000 mg | Freq: Once | INTRAMUSCULAR | Status: AC
  Administered 2013-04-26: 4 mg via INTRAVENOUS

## 2013-04-26 SURGICAL SUPPLY — 38 items
APPLIER CLIP LAPSCP 10X32 DD (CLIP) ×2 IMPLANT
BAG HAMPER (MISCELLANEOUS) ×2 IMPLANT
CLOTH BEACON ORANGE TIMEOUT ST (SAFETY) ×2 IMPLANT
COVER LIGHT HANDLE STERIS (MISCELLANEOUS) ×4 IMPLANT
DECANTER SPIKE VIAL GLASS SM (MISCELLANEOUS) ×2 IMPLANT
DURAPREP 26ML APPLICATOR (WOUND CARE) ×2 IMPLANT
ELECT REM PT RETURN 9FT ADLT (ELECTROSURGICAL) ×2
ELECTRODE REM PT RTRN 9FT ADLT (ELECTROSURGICAL) ×1 IMPLANT
FILTER SMOKE EVAC LAPAROSHD (FILTER) ×2 IMPLANT
FORMALIN 10 PREFIL 120ML (MISCELLANEOUS) ×2 IMPLANT
GLOVE BIO SURGEON STRL SZ7.5 (GLOVE) ×2 IMPLANT
GLOVE BIOGEL PI IND STRL 7.0 (GLOVE) ×2 IMPLANT
GLOVE BIOGEL PI INDICATOR 7.0 (GLOVE) ×2
GLOVE ECLIPSE 7.0 STRL STRAW (GLOVE) ×2 IMPLANT
GLOVE SS BIOGEL STRL SZ 6.5 (GLOVE) ×1 IMPLANT
GLOVE SUPERSENSE BIOGEL SZ 6.5 (GLOVE) ×1
GOWN STRL REIN XL XLG (GOWN DISPOSABLE) ×6 IMPLANT
HEMOSTAT SNOW SURGICEL 2X4 (HEMOSTASIS) ×2 IMPLANT
INST SET LAPROSCOPIC AP (KITS) ×2 IMPLANT
KIT ROOM TURNOVER APOR (KITS) ×2 IMPLANT
MANIFOLD NEPTUNE II (INSTRUMENTS) ×2 IMPLANT
NEEDLE INSUFFLATION 14GA 120MM (NEEDLE) ×2 IMPLANT
NS IRRIG 1000ML POUR BTL (IV SOLUTION) ×2 IMPLANT
PACK LAP CHOLE LZT030E (CUSTOM PROCEDURE TRAY) ×2 IMPLANT
PAD ARMBOARD 7.5X6 YLW CONV (MISCELLANEOUS) ×2 IMPLANT
POUCH SPECIMEN RETRIEVAL 10MM (ENDOMECHANICALS) ×2 IMPLANT
SET BASIN LINEN APH (SET/KITS/TRAYS/PACK) ×2 IMPLANT
SLEEVE Z-THREAD 5X100MM (TROCAR) ×2 IMPLANT
SPONGE GAUZE 2X2 8PLY STRL LF (GAUZE/BANDAGES/DRESSINGS) ×8 IMPLANT
STAPLER VISISTAT (STAPLE) ×2 IMPLANT
SUT VICRYL 0 UR6 27IN ABS (SUTURE) ×2 IMPLANT
TAPE CLOTH SURG 4X10 WHT LF (GAUZE/BANDAGES/DRESSINGS) ×2 IMPLANT
TROCAR Z-THRD FIOS HNDL 11X100 (TROCAR) ×2 IMPLANT
TROCAR Z-THREAD FIOS 5X100MM (TROCAR) ×2 IMPLANT
TROCAR Z-THREAD SLEEVE 11X100 (TROCAR) ×2 IMPLANT
TUBING INSUFFLATION (TUBING) ×2 IMPLANT
WARMER LAPAROSCOPE (MISCELLANEOUS) ×2 IMPLANT
YANKAUER SUCT 12FT TUBE ARGYLE (SUCTIONS) ×2 IMPLANT

## 2013-04-26 NOTE — Discharge Summary (Signed)
Physician Discharge Summary  Patient ID: Deborah Barnes MRN: [id number] DOB/AGE: 1976/03/10 37 y.o.  Admit date: 04/25/2013 Discharge date: 04/26/2013  Admission Diagnoses: Acute cholecystitis, cholelithiasis  Discharge Diagnoses: Same Active Problems:   * No active hospital problems. *   Discharged Condition: good  Hospital Course: Patient is a 37 year old white female who presented emergency room with worsening right upper quadrant abdominal pain. She had been just recently diagnosed with cholelithiasis. She was admitted to the hospital for control of her pain and nausea. She was started on IV Unasyn. She subsequently underwent laparoscopic cholecystectomy on 04/26/2013. She tolerated procedure well. Postoperative course has been unremarkable. Her diet was advanced without difficulty. Patient is being discharged home in good improving condition.  Treatments: surgery: Laparoscopic cholecystectomy on 04/26/2013  Discharge Exam: Blood pressure 124/80, pulse 73, temperature 98.1 F (36.7 C), temperature source Oral, resp. rate 13, height 5\' 5"  (1.651 m), weight 106.595 kg (235 lb), last menstrual period 03/29/2013, SpO2 100.00%. General appearance: alert and cooperative Resp: clear to auscultation bilaterally Cardio: regular rate and rhythm, S1, S2 normal, no murmur, click, rub or gallop GI: Soft. Dressings dry and intact.  Disposition: Home    Medication List    STOP taking these medications       HYDROcodone-acetaminophen 10-325 MG per tablet  Commonly known as:  NORCO      TAKE these medications       albuterol 108 (90 BASE) MCG/ACT inhaler  Commonly known as:  PROVENTIL HFA;VENTOLIN HFA  Inhale 2 puffs into the lungs every 6 (six) hours as needed for wheezing.     atenolol 50 MG tablet  Commonly known as:  TENORMIN  Take 25 mg by mouth daily. For HIGH BLOOD PRESSURE     carbamazepine 200 MG tablet  Commonly known as:  TEGRETOL  Take 400 mg by mouth daily. for MOOD  SWINGS     cetirizine 10 MG tablet  Commonly known as:  ZYRTEC  Take 10 mg by mouth daily. For ALLERGIES     Choline Fenofibrate 135 MG capsule  Take 135 mg by mouth daily. For HIGH TRIGLYCERIDES     escitalopram 10 MG tablet  Commonly known as:  LEXAPRO  Take 10 mg by mouth daily. For DEPRESSION     hydrochlorothiazide 25 MG tablet  Commonly known as:  HYDRODIURIL  Take 12.5 mg by mouth daily. For FLUID     lamoTRIgine 200 MG tablet  Commonly known as:  LAMICTAL  Take 200 mg by mouth daily. For BIPOLAR     Melatonin 10 MG Tabs  Take 10 mg by mouth at bedtime as needed (Sleep).     omeprazole 20 MG capsule  Commonly known as:  PRILOSEC  Take 20 mg by mouth 2 (two) times daily. For GERD     oxyCODONE-acetaminophen 7.5-325 MG per tablet  Commonly known as:  PERCOCET  Take 1-2 tablets by mouth every 4 (four) hours as needed for pain.     tiZANidine 4 MG tablet  Commonly known as:  ZANAFLEX  Take 4 mg by mouth 3 (three) times daily. MUSCLE SPASMS     traMADol 50 MG tablet  Commonly known as:  ULTRAM  Take 50 mg by mouth every 6 (six) hours as needed. PAIN     Vitamin D 2000 UNITS Caps  Take 2 capsules by mouth daily.           Follow-up Information   Follow up with Dalia Heading, MD. Schedule an appointment as soon  as possible for a visit on 05/07/2013.   Contact information:   [id number] [place] [address] [phone]       Signed: Franky Macho A 04/26/2013, 11:06 AM

## 2013-04-26 NOTE — Transfer of Care (Signed)
Immediate Anesthesia Transfer of Care Note  Patient: Deborah Barnes  Procedure(s) Performed: Procedure(s) (LRB): LAPAROSCOPIC CHOLECYSTECTOMY (N/A)  Patient Location: PACU  Anesthesia Type: General  Level of Consciousness: awake  Airway & Oxygen Therapy: Patient Spontanous Breathing and non-rebreather face mask  Post-op Assessment: Report given to PACU RN, Post -op Vital signs reviewed and stable and Patient moving all extremities  Post vital signs: Reviewed and stable  Complications: No apparent anesthesia complications

## 2013-04-26 NOTE — Anesthesia Procedure Notes (Signed)
Procedure Name: Intubation Date/Time: 04/26/2013 10:07 AM Performed by: Franco Nones Pre-anesthesia Checklist: Patient identified, Patient being monitored, Timeout performed, Emergency Drugs available and Suction available Patient Re-evaluated:Patient Re-evaluated prior to inductionOxygen Delivery Method: Circle System Utilized Preoxygenation: Pre-oxygenation with 100% oxygen Intubation Type: IV induction, Rapid sequence and Cricoid Pressure applied Laryngoscope Size: Miller and 2 Grade View: Grade I Tube type: Oral Tube size: 7.0 mm Number of attempts: 1 Airway Equipment and Method: stylet Placement Confirmation: ETT inserted through vocal cords under direct vision,  positive ETCO2 and breath sounds checked- equal and bilateral Secured at: 21 cm Tube secured with: Tape Dental Injury: Teeth and Oropharynx as per pre-operative assessment

## 2013-04-26 NOTE — Anesthesia Preprocedure Evaluation (Signed)
Anesthesia Evaluation  Patient identified by MRN, date of birth, ID band Patient awake    Reviewed: Allergy & Precautions, H&P , NPO status , Patient's Chart, lab work & pertinent test results  Airway Mallampati: II TM Distance: >3 FB Neck ROM: Full    Dental  (+) Teeth Intact   Pulmonary Current Smoker,  breath sounds clear to auscultation        Cardiovascular hypertension, Pt. on medications Rhythm:Regular Rate:Normal     Neuro/Psych PSYCHIATRIC DISORDERS Bipolar Disorder    GI/Hepatic GERD-  Medicated and Controlled,  Endo/Other  Morbid obesity  Renal/GU      Musculoskeletal   Abdominal   Peds  Hematology   Anesthesia Other Findings   Reproductive/Obstetrics                           Anesthesia Physical Anesthesia Plan  ASA: II  Anesthesia Plan: General   Post-op Pain Management:    Induction: Intravenous, Rapid sequence and Cricoid pressure planned  Airway Management Planned: Oral ETT  Additional Equipment:   Intra-op Plan:   Post-operative Plan: Extubation in OR  Informed Consent: I have reviewed the patients History and Physical, chart, labs and discussed the procedure including the risks, benefits and alternatives for the proposed anesthesia with the patient or authorized representative who has indicated his/her understanding and acceptance.     Plan Discussed with:   Anesthesia Plan Comments:         Anesthesia Quick Evaluation

## 2013-04-26 NOTE — Op Note (Signed)
Patient:  Deborah Barnes  DOB:  26-Apr-1976  MRN:  [id number]   Preop Diagnosis:  Cholecystitis, cholelithiasis  Postop Diagnosis:  Same  Procedure:  Laparoscopic cholecystectomy  Surgeon:  Franky Macho, M.D.  Anes:  General endotracheal  Indications:  Patient is a 37 year old white female presents with acute cholecystitis secondary to cholelithiasis. The risks and benefits of the procedure including bleeding, infection, hepatobiliary injury, and the possibility of an open procedure were fully explained to the patient, who gave informed consent.  Procedure note:  The patient was placed the supine position. After induction of general endotracheal anesthesia, the abdomen was prepped and draped using usual sterile technique with DuraPrep. Surgical site confirmation was performed.  A supraumbilical incision was made down to the fascia. A Veress needle was introduced into the abdominal cavity and confirmation of placement was done using the saline drop test. The abdomen was then insufflated to 16 mm mercury pressure. An 11 mm trocar was introduced into the abdominal cavity under direct visualization without difficulty. The patient was placed in reverse Trendelenburg position and additional 11 mm trocar was placed the epigastric region and 5 mm trochars were placed the right upper quadrant and right flank regions. The liver was inspected and noted to be within normal limits. The gallbladder was noted to be somewhat edematous and distended. The gallbladder was retracted in a dynamic fashion in order to expose the triangle of Calot. The cystic duct was first identified. Its juncture to the infundibulum was fully identified. Endoclips were placed proximally and distally on the cystic duct and cystic duct was divided. This was likewise done to the cystic artery. The gallbladder was then freed away from the gallbladder fossa using Bovie electrocautery. The gallbladder was delivered through the epigastric  trocar site using an Endo Catch bag. Sent to pathology further examination. No abnormal bleeding or bile leakage was noted. Surgicel is placed the gallbladder fossa. All fluid and air were then evacuated from the abdominal cavity prior to removal of the trochars.  All wounds were irrigated normal saline. All wounds were checked with 0.5% Sensorcaine. The supraumbilical fascia as well as epigastric fascia were reapproximated using 0 Vicryl interrupted sutures. All skin incisions were closed using staples. Betadine ointment and dry sterile dressings were applied.  All tape and needle counts were correct at the end of the procedure. Patient was extubated in the operating room and transferred to the PACU in stable condition.  Complications:  None  EBL:  Minimal  Specimen:  Gallbladder

## 2013-04-26 NOTE — Plan of Care (Signed)
Problem: Discharge Progression Outcomes Goal: Barriers To Progression Addressed/Resolved Outcome: Completed/Met Date Met:  04/26/13 Voiding post op ambulating Goal: Tolerating diet Outcome: Completed/Met Date Met:  04/26/13 Tolerated reg diet without nausea Goal: Activity appropriate for discharge plan Outcome: Completed/Met Date Met:  04/26/13 Ambulated in halls Goal: Other Discharge Outcomes/Goals Outcome: Completed/Met Date Met:  04/26/13 Discharge instructions read to pt and her family both verbalized understanding of all instructions  They will call for follow up appt as per the instructions

## 2013-04-26 NOTE — Anesthesia Postprocedure Evaluation (Signed)
Anesthesia Post Note  Patient: Deborah Barnes  Procedure(s) Performed: Procedure(s) (LRB): LAPAROSCOPIC CHOLECYSTECTOMY (N/A)  Anesthesia type: General  Patient location: PACU  Post pain: Pain level controlled  Post assessment: Post-op Vital signs reviewed, Patient's Cardiovascular Status Stable, Respiratory Function Stable, Patent Airway, No signs of Nausea or vomiting and Pain level controlled  Last Vitals:  Filed Vitals:   04/26/13 1057  BP: 126/76  Pulse: 81  Temp: 36.7 C  Resp: 18    Post vital signs: Reviewed and stable  Level of consciousness: awake and alert   Complications: No apparent anesthesia complications

## 2013-04-29 ENCOUNTER — Encounter (HOSPITAL_COMMUNITY): Payer: Self-pay | Admitting: General Surgery

## 2013-09-26 ENCOUNTER — Encounter (HOSPITAL_COMMUNITY): Payer: Self-pay | Admitting: Pharmacy Technician

## 2013-09-26 NOTE — H&P (Signed)
  NTS SOAP Note  Vital Signs:  Vitals as of: 4/0/8144: Systolic 818: Diastolic 92: Heart Rate 79: Temp 96.39F: Height 28ft 5in: Weight 247Lbs 0 Ounces: Pain Level 7: BMI 41.1  BMI : 41.1 kg/m2  Subjective: This 38 Years 62 Months old Female presents for of a swelling at epigastric trochar site.  Started swelling and was painful several weeks ago.  Made worse with straining.  Had lap cholecystectomy in 2014.  Review of Symptoms:  Constitutional:unremarkable   Head:unremarkable    Eyes:unremarkable   Nose/Mouth/Throat:unremarkable Cardiovascular:  unremarkable   Respiratory:unremarkable   Gastrointestin    abdominal pain Genitourinary:unremarkable     Musculoskeletal:unremarkable   Hematolgic/Lymphatic:unremarkable     Allergic/Immunologic:unremarkable     Past Medical History:    Reviewed   Past Medical History  Surgical History: lap cholecystectomy Medical Problems: see epic Allergies: see epic Medications: see epic   Social History:Reviewed  Social History  Preferred Language: English Race:  White Ethnicity: Not Hispanic / Latino Age: 38 Years 9 Months Marital Status:  S Alcohol: yes Recreational drug(s): no   Smoking Status: Unknown if ever smoked Functional Status reviewed on mm/dd/yyyy ------------------------------------------------ Bathing: Normal Cooking: Normal Dressing: Normal Driving: Normal Eating: Normal Managing Meds: Normal Oral Care: Normal Shopping: Normal Toileting: Normal Transferring: Normal Walking: Normal Cognitive Status reviewed on mm/dd/yyyy ------------------------------------------------ Attention: Normal Decision Making: Normal Language: Normal Memory: Normal Motor: Normal Perception: Normal Problem Solving: Normal Visual and Spatial: Normal   Family History:  Reviewed  Family Health History Family History is Unknown    Objective Information: General:  Well appearing,  well nourished in no distress. Heart:  RRR, no murmur Lungs:    CTA bilaterally, no wheezes, rhonchi, rales.  Breathing unlabored. Abdomen:Soft, incisional hernia, small, at epigastric trochar site, reducible, ND, no HSM, no masses.  Assessment:Incisional hernia    Plan:  Scheduled for incisional herniorrhaphy with mesh on 10/04/13.   Patient Education:Alternative treatments to surgery were discussed with patient (and family).  Risks and benefits  of procedure including bleeding, infection, mesh placeement, and the possibility of recurrence of the hernia were fully explained to the patient (and family) who gave informed consent. Patient/family questions were addressed.  Follow-up:Pending Surgery

## 2013-09-30 ENCOUNTER — Encounter (HOSPITAL_COMMUNITY): Payer: Self-pay

## 2013-09-30 ENCOUNTER — Encounter (HOSPITAL_COMMUNITY)
Admission: RE | Admit: 2013-09-30 | Discharge: 2013-09-30 | Disposition: A | Discharge status: Home / Self Care | Payer: Medicaid Other | Source: Ambulatory Visit | Attending: General Surgery | Admitting: General Surgery

## 2013-09-30 DIAGNOSIS — Z01812 Encounter for preprocedural laboratory examination: Secondary | ICD-10-CM | POA: Insufficient documentation

## 2013-09-30 DIAGNOSIS — Z01818 Encounter for other preprocedural examination: Secondary | ICD-10-CM | POA: Insufficient documentation

## 2013-09-30 HISTORY — DX: Chronic obstructive pulmonary disease, unspecified: J44.9

## 2013-09-30 HISTORY — DX: Shortness of breath: R06.02

## 2013-09-30 HISTORY — DX: Unspecified asthma, uncomplicated: J45.909

## 2013-09-30 LAB — CBC WITH DIFFERENTIAL/PLATELET
BASOS PCT: 0 % (ref 0–1)
Basophils Absolute: 0 10*3/uL (ref 0.0–0.1)
Eosinophils Absolute: 0.4 10*3/uL (ref 0.0–0.7)
Eosinophils Relative: 3 % (ref 0–5)
HEMATOCRIT: 38.4 % (ref 36.0–46.0)
HEMOGLOBIN: 13.3 g/dL (ref 12.0–15.0)
LYMPHS ABS: 3 10*3/uL (ref 0.7–4.0)
Lymphocytes Relative: 28 % (ref 12–46)
MCH: 31.9 pg (ref 26.0–34.0)
MCHC: 34.6 g/dL (ref 30.0–36.0)
MCV: 92.1 fL (ref 78.0–100.0)
MONO ABS: 0.6 10*3/uL (ref 0.1–1.0)
MONOS PCT: 6 % (ref 3–12)
NEUTROS ABS: 6.8 10*3/uL (ref 1.7–7.7)
Neutrophils Relative %: 63 % (ref 43–77)
Platelets: 416 10*3/uL — ABNORMAL HIGH (ref 150–400)
RBC: 4.17 MIL/uL (ref 3.87–5.11)
RDW: 12.9 % (ref 11.5–15.5)
WBC: 10.8 10*3/uL — ABNORMAL HIGH (ref 4.0–10.5)

## 2013-09-30 LAB — BASIC METABOLIC PANEL
BUN: 10 mg/dL (ref 6–23)
CHLORIDE: 103 meq/L (ref 96–112)
CO2: 26 mEq/L (ref 19–32)
Calcium: 9.7 mg/dL (ref 8.4–10.5)
Creatinine, Ser: 0.71 mg/dL (ref 0.50–1.10)
GFR calc non Af Amer: >90 mL/min (ref >90)
GLUCOSE: 107 mg/dL — AB (ref 70–99)
POTASSIUM: 4 meq/L (ref 3.7–5.3)
SODIUM: 143 meq/L (ref 137–147)

## 2013-09-30 LAB — HCG, SERUM, QUALITATIVE: Preg, Serum: NEGATIVE

## 2013-09-30 NOTE — Patient Instructions (Signed)
Deborah Barnes  09/30/2013   Your procedure is scheduled on:  10/04/2013  Report to [facility] at  8  AM.  Call this number if you have problems the morning of surgery: [phone]   Remember:   Do not eat food or drink liquids after midnight.   Take these medicines the morning of surgery with A SIP OF WATER: norco,atenolol, tegretal, zyrtec, lexapro,hydrodiuril, prilosec, zanaflex. Take your inhaler before you come and bring it with you.   Do not wear jewelry, make-up or nail polish.  Do not wear lotions, powders, or perfumes.   Do not shave 48 hours prior to surgery. Men may shave face and neck.  Do not bring valuables to the hospital.  [facility] is not responsible for any belongings or valuables.               Contacts, dentures or bridgework may not be worn into surgery.  Leave suitcase in the car. After surgery it may be brought to your room.  For patients admitted to the hospital, discharge time is determined by your treatment team.               Patients discharged the day of surgery will not be allowed to drive home.  Name and phone number of your driver: family  Special Instructions: Shower using CHG 2 nights before surgery and the night before surgery.  If you shower the day of surgery use CHG.  Use special wash - you have one bottle of CHG for all showers.  You should use approximately 1/3 of the bottle for each shower.   Please read over the following fact sheets that you were given: Pain Booklet, Coughing and Deep Breathing, Surgical Site Infection Prevention, Anesthesia Post-op Instructions and Care and Recovery After Surgery Hernia A hernia happens when an organ inside your body pushes out through a weak spot in your belly (abdominal) wall. Most hernias get worse over time. They can often be pushed back into place (reduced). Surgery may be needed to repair hernias that cannot be pushed into place. HOME CARE  Keep doing normal activities.  Avoid lifting more  than 10 pounds (4.5 kilograms).  Cough gently and avoid straining. Over time, these things will:  Increase your hernia size.  Irritate your hernia.  Break down hernia repairs.  Stop smoking.  Do not wear anything tight over your hernia. Do not keep the hernia in with an outside bandage.  Eat food that is high in fiber (fruit, vegetables, whole grains).  Drink enough fluids to keep your pee (urine) clear or pale yellow.  Take medicines to make your poop soft (stool softeners) if you cannot poop (constipated). GET HELP RIGHT AWAY IF:   You have a fever.  You have belly pain that gets worse.  You feel sick to your stomach (nauseous) and throw up (vomit).  Your skin starts to bulge out.  Your hernia turns a different color, feels hard, or is tender.  You have increased pain or puffiness (swelling) around the hernia.  You poop more or less often.  Your poop does not look the way normally does.  You have watery poop (diarrhea).  You cannot push the hernia back in place by applying gentle pressure while lying down. MAKE SURE YOU:   Understand these instructions.  Will watch your condition.  Will get help right away if you are not doing well or get worse. Document Released: 02/23/2010 Document Revised: 11/28/2011 Document Reviewed:  02/23/2010 ExitCare Patient Information 2014 [place]. PATIENT INSTRUCTIONS POST-ANESTHESIA  IMMEDIATELY FOLLOWING SURGERY:  Do not drive or operate machinery for the first twenty four hours after surgery.  Do not make any important decisions for twenty four hours after surgery or while taking narcotic pain medications or sedatives.  If you develop intractable nausea and vomiting or a severe headache please notify your doctor immediately.  FOLLOW-UP:  Please make an appointment with your surgeon as instructed. You do not need to follow up with anesthesia unless specifically instructed to do so.  WOUND CARE INSTRUCTIONS (if  applicable):  Keep a dry clean dressing on the anesthesia/puncture wound site if there is drainage.  Once the wound has quit draining you may leave it open to air.  Generally you should leave the bandage intact for twenty four hours unless there is drainage.  If the epidural site drains for more than 36-48 hours please call the anesthesia department.  QUESTIONS?:  Please feel free to call your physician or the hospital operator if you have any questions, and they will be happy to assist you.

## 2013-10-04 ENCOUNTER — Encounter (HOSPITAL_COMMUNITY)
Admission: RE | Disposition: A | Discharge status: Home / Self Care | Payer: Self-pay | Source: Ambulatory Visit | Attending: General Surgery

## 2013-10-04 ENCOUNTER — Ambulatory Visit (HOSPITAL_COMMUNITY): Payer: Medicaid Other | Admitting: Anesthesiology

## 2013-10-04 ENCOUNTER — Encounter (HOSPITAL_COMMUNITY): Payer: Self-pay | Admitting: *Deleted

## 2013-10-04 ENCOUNTER — Ambulatory Visit (HOSPITAL_COMMUNITY)
Admission: RE | Admit: 2013-10-04 | Discharge: 2013-10-04 | Disposition: A | Discharge status: Home / Self Care | Payer: Medicaid Other | Source: Ambulatory Visit | Attending: General Surgery | Admitting: General Surgery

## 2013-10-04 ENCOUNTER — Encounter (HOSPITAL_COMMUNITY): Payer: Medicaid Other | Admitting: Anesthesiology

## 2013-10-04 DIAGNOSIS — J4489 Other specified chronic obstructive pulmonary disease: Secondary | ICD-10-CM | POA: Insufficient documentation

## 2013-10-04 DIAGNOSIS — I10 Essential (primary) hypertension: Secondary | ICD-10-CM | POA: Insufficient documentation

## 2013-10-04 DIAGNOSIS — J449 Chronic obstructive pulmonary disease, unspecified: Secondary | ICD-10-CM | POA: Insufficient documentation

## 2013-10-04 DIAGNOSIS — K432 Incisional hernia without obstruction or gangrene: Secondary | ICD-10-CM | POA: Insufficient documentation

## 2013-10-04 HISTORY — PX: INSERTION OF MESH: SHX5868

## 2013-10-04 HISTORY — PX: INCISIONAL HERNIA REPAIR: SHX193

## 2013-10-04 SURGERY — REPAIR, HERNIA, INCISIONAL
Anesthesia: General | Site: Abdomen

## 2013-10-04 MED ORDER — CHLORHEXIDINE GLUCONATE 4 % EX LIQD: 1.0000 "application " | Freq: Once | CUTANEOUS | Status: DC

## 2013-10-04 MED ORDER — MIDAZOLAM HCL 5 MG/5ML IJ SOLN
INTRAMUSCULAR | Status: DC | PRN
  Administered 2013-10-04: 2 mg via INTRAVENOUS

## 2013-10-04 MED ORDER — POVIDONE-IODINE 10 % OINT PACKET
TOPICAL_OINTMENT | CUTANEOUS | Status: DC | PRN
  Administered 2013-10-04: 1 via TOPICAL

## 2013-10-04 MED ORDER — POVIDONE-IODINE 10 % EX OINT
TOPICAL_OINTMENT | CUTANEOUS | Status: AC
  Filled 2013-10-04: qty 1

## 2013-10-04 MED ORDER — GLYCOPYRROLATE 0.2 MG/ML IJ SOLN
INTRAMUSCULAR | Status: DC | PRN
  Administered 2013-10-04: 0.4 mg via INTRAVENOUS

## 2013-10-04 MED ORDER — MIDAZOLAM HCL 2 MG/2ML IJ SOLN
INTRAMUSCULAR | Status: AC
  Filled 2013-10-04: qty 2

## 2013-10-04 MED ORDER — PROPOFOL 10 MG/ML IV BOLUS
INTRAVENOUS | Status: DC | PRN
  Administered 2013-10-04: 150 mg via INTRAVENOUS

## 2013-10-04 MED ORDER — ONDANSETRON HCL 4 MG/2ML IJ SOLN
INTRAMUSCULAR | Status: AC
  Filled 2013-10-04: qty 2

## 2013-10-04 MED ORDER — FENTANYL CITRATE 0.05 MG/ML IJ SOLN
INTRAMUSCULAR | Status: DC | PRN
  Administered 2013-10-04 (×3): 100 ug via INTRAVENOUS
  Administered 2013-10-04: 50 ug via INTRAVENOUS

## 2013-10-04 MED ORDER — OXYCODONE-ACETAMINOPHEN 7.5-325 MG PO TABS: 1.0000 | ORAL_TABLET | ORAL | Status: DC | PRN

## 2013-10-04 MED ORDER — CEFAZOLIN SODIUM-DEXTROSE 2-3 GM-% IV SOLR
INTRAVENOUS | Status: AC
  Filled 2013-10-04: qty 50

## 2013-10-04 MED ORDER — ROCURONIUM BROMIDE 100 MG/10ML IV SOLN
INTRAVENOUS | Status: DC | PRN
  Administered 2013-10-04: 35 mg via INTRAVENOUS

## 2013-10-04 MED ORDER — FENTANYL CITRATE 0.05 MG/ML IJ SOLN
INTRAMUSCULAR | Status: AC
  Filled 2013-10-04: qty 2

## 2013-10-04 MED ORDER — GLYCOPYRROLATE 0.2 MG/ML IJ SOLN
INTRAMUSCULAR | Status: AC
  Filled 2013-10-04: qty 2

## 2013-10-04 MED ORDER — KETOROLAC TROMETHAMINE 30 MG/ML IJ SOLN
30.0000 mg | Freq: Once | INTRAMUSCULAR | Status: AC
  Administered 2013-10-04: 30 mg via INTRAVENOUS

## 2013-10-04 MED ORDER — ROCURONIUM BROMIDE 50 MG/5ML IV SOLN
INTRAVENOUS | Status: AC
  Filled 2013-10-04: qty 1

## 2013-10-04 MED ORDER — MIDAZOLAM HCL 2 MG/2ML IJ SOLN
1.0000 mg | INTRAMUSCULAR | Status: DC | PRN
  Administered 2013-10-04: 2 mg via INTRAVENOUS

## 2013-10-04 MED ORDER — LIDOCAINE HCL 1 % IJ SOLN
INTRAMUSCULAR | Status: DC | PRN
  Administered 2013-10-04: 50 mg via INTRADERMAL

## 2013-10-04 MED ORDER — LACTATED RINGERS IV SOLN
INTRAVENOUS | Status: DC
  Administered 2013-10-04: 1000 mL via INTRAVENOUS

## 2013-10-04 MED ORDER — FENTANYL CITRATE 0.05 MG/ML IJ SOLN
INTRAMUSCULAR | Status: AC
  Filled 2013-10-04: qty 5

## 2013-10-04 MED ORDER — BUPIVACAINE HCL (PF) 0.5 % IJ SOLN
INTRAMUSCULAR | Status: AC
  Filled 2013-10-04: qty 30

## 2013-10-04 MED ORDER — CEFAZOLIN SODIUM-DEXTROSE 2-3 GM-% IV SOLR
2.0000 g | Freq: Once | INTRAVENOUS | Status: AC
  Administered 2013-10-04: 2 g via INTRAVENOUS

## 2013-10-04 MED ORDER — ONDANSETRON HCL 4 MG/2ML IJ SOLN: 4.0000 mg | Freq: Once | INTRAMUSCULAR | Status: DC | PRN

## 2013-10-04 MED ORDER — KETOROLAC TROMETHAMINE 30 MG/ML IJ SOLN
INTRAMUSCULAR | Status: AC
  Filled 2013-10-04: qty 1

## 2013-10-04 MED ORDER — BUPIVACAINE HCL (PF) 0.5 % IJ SOLN
INTRAMUSCULAR | Status: DC | PRN
  Administered 2013-10-04: 10 mL

## 2013-10-04 MED ORDER — PROPOFOL 10 MG/ML IV EMUL
INTRAVENOUS | Status: AC
  Filled 2013-10-04: qty 20

## 2013-10-04 MED ORDER — FENTANYL CITRATE 0.05 MG/ML IJ SOLN
25.0000 ug | INTRAMUSCULAR | Status: DC | PRN
  Administered 2013-10-04 (×2): 25 ug via INTRAVENOUS
  Administered 2013-10-04 (×2): 50 ug via INTRAVENOUS
  Filled 2013-10-04: qty 2

## 2013-10-04 MED ORDER — NEOSTIGMINE METHYLSULFATE 1 MG/ML IJ SOLN
INTRAMUSCULAR | Status: DC | PRN
  Administered 2013-10-04 (×3): 1 mg via INTRAVENOUS

## 2013-10-04 MED ORDER — ONDANSETRON HCL 4 MG/2ML IJ SOLN
4.0000 mg | Freq: Once | INTRAMUSCULAR | Status: AC
Start: 2013-10-04 — End: 2013-10-04
  Administered 2013-10-04: 4 mg via INTRAVENOUS

## 2013-10-04 MED ORDER — SODIUM CHLORIDE 0.9 % IR SOLN
Status: DC | PRN
  Administered 2013-10-04: 1000 mL

## 2013-10-04 SURGICAL SUPPLY — 32 items
BAG HAMPER (MISCELLANEOUS) ×3 IMPLANT
CLOTH BEACON ORANGE TIMEOUT ST (SAFETY) ×3 IMPLANT
COVER LIGHT HANDLE STERIS (MISCELLANEOUS) ×6 IMPLANT
DECANTER SPIKE VIAL GLASS SM (MISCELLANEOUS) ×3 IMPLANT
DURAPREP 26ML APPLICATOR (WOUND CARE) ×6 IMPLANT
ELECT REM PT RETURN 9FT ADLT (ELECTROSURGICAL) ×3
ELECTRODE REM PT RTRN 9FT ADLT (ELECTROSURGICAL) ×1 IMPLANT
GLOVE BIOGEL PI IND STRL 7.0 (GLOVE) ×1 IMPLANT
GLOVE BIOGEL PI IND STRL 7.5 (GLOVE) ×3 IMPLANT
GLOVE BIOGEL PI IND STRL 8 (GLOVE) ×1 IMPLANT
GLOVE BIOGEL PI INDICATOR 7.0 (GLOVE) ×2
GLOVE BIOGEL PI INDICATOR 7.5 (GLOVE) ×6
GLOVE BIOGEL PI INDICATOR 8 (GLOVE) ×2
GLOVE ECLIPSE 7.0 STRL STRAW (GLOVE) ×9 IMPLANT
GLOVE ECLIPSE 7.5 STRL STRAW (GLOVE) ×3 IMPLANT
GOWN STRL REUS W/TWL LRG LVL3 (GOWN DISPOSABLE) ×9 IMPLANT
INST SET MAJOR GENERAL (KITS) ×3 IMPLANT
KIT ROOM TURNOVER APOR (KITS) ×3 IMPLANT
MANIFOLD NEPTUNE II (INSTRUMENTS) ×3 IMPLANT
MESH VENTRALEX ST 1-7/10 CRC S (Mesh General) ×3 IMPLANT
NEEDLE HYPO 25X1 1.5 SAFETY (NEEDLE) ×3 IMPLANT
NS IRRIG 1000ML POUR BTL (IV SOLUTION) ×3 IMPLANT
PACK ABDOMINAL MAJOR (CUSTOM PROCEDURE TRAY) ×3 IMPLANT
PAD ARMBOARD 7.5X6 YLW CONV (MISCELLANEOUS) ×3 IMPLANT
SET BASIN LINEN APH (SET/KITS/TRAYS/PACK) ×3 IMPLANT
SPONGE GAUZE 4X4 12PLY (GAUZE/BANDAGES/DRESSINGS) ×3 IMPLANT
STAPLER VISISTAT (STAPLE) ×3 IMPLANT
SUT ETHIBOND NAB MO 7 #0 18IN (SUTURE) ×3 IMPLANT
SUT VIC AB 3-0 SH 27 (SUTURE) ×4
SUT VIC AB 3-0 SH 27X BRD (SUTURE) ×2 IMPLANT
SYR CONTROL 10ML LL (SYRINGE) ×3 IMPLANT
TAPE CLOTH SURG 4X10 WHT LF (GAUZE/BANDAGES/DRESSINGS) ×3 IMPLANT

## 2013-10-04 NOTE — Interval H&P Note (Signed)
History and Physical Interval Note:  10/04/2013 9:43 AM  Deborah Barnes  has presented today for surgery, with the diagnosis of incisional hernia  The various methods of treatment have been discussed with the patient and family. After consideration of risks, benefits and other options for treatment, the patient has consented to  Procedure(s): HERNIA REPAIR INCISIONAL WITH MESH (N/A) as a surgical intervention .  The patient's history has been reviewed, patient examined, no change in status, stable for surgery.  I have reviewed the patient's chart and labs.  Questions were answered to the patient's satisfaction.     Aviva Signs A

## 2013-10-04 NOTE — Discharge Instructions (Signed)
Ventral Hernia A ventral hernia (also called an incisional hernia) is a hernia that occurs at the site of a previous surgical cut (incision) in the abdomen. The abdominal wall spans from your lower chest down to your pelvis. If the abdominal wall is weakened from a surgical incision, a hernia can occur. A hernia is a bulge of bowel or muscle tissue pushing out on the weakened part of the abdominal wall. Ventral hernias can get bigger from straining or lifting. Obese and older people are at higher risk for a ventral hernia. People who develop infections after surgery or require repeat incisions at the same site on the abdomen are also at increased risk. CAUSES  A ventral hernia occurs because of weakness in the abdominal wall at an incision site.  SYMPTOMS  Common symptoms include:  A visible bulge or lump on the abdominal wall.  Pain or tenderness around the lump.  Increased discomfort if you cough or make a sudden movement. If the hernia has blocked part of the intestine, a serious complication can occur (incarcerated or strangulated hernia). This can become a problem that requires emergency surgery because the blood flow to the blocked intestine may be cut off. Symptoms may include:  Feeling sick to your stomach (nauseous).  Throwing up (vomiting).  Stomach swelling (distention) or bloating.  Fever.  Rapid heartbeat. DIAGNOSIS  Your caregiver will take a medical history and perform a physical exam. Various tests may be ordered, such as:  Blood tests.  Urine tests.  Ultrasonography.  X-rays.  Computed tomography (CT). TREATMENT  Watchful waiting may be all that is needed for a smaller hernia that does not cause symptoms. Your caregiver may recommend the use of a supportive belt (truss) that helps to keep the abdominal wall intact. For larger hernias or those that cause pain, surgery to repair the hernia is usually recommended. If a hernia becomes strangulated, emergency surgery  needs to be done right away. HOME CARE INSTRUCTIONS  Avoid putting pressure or strain on the abdominal area.  Avoid heavy lifting.  Use good body positioning for physical tasks. Ask your caregiver about proper body positioning.  Use a supportive belt as directed by your caregiver.  Maintain a healthy weight.  Eat foods that are high in fiber, such as whole grains, fruits, and vegetables. Fiber helps prevent difficult bowel movements (constipation).  Drink enough fluids to keep your urine clear or pale yellow.  Follow up with your caregiver as directed. SEEK MEDICAL CARE IF:   Your hernia seems to be getting larger or more painful. SEEK IMMEDIATE MEDICAL CARE IF:   You have abdominal pain that is sudden and sharp.  Your pain becomes severe.  You have repeated vomiting.  You are sweating a lot.  You notice a rapid heartbeat.  You develop a fever. MAKE SURE YOU:   Understand these instructions.  Will watch your condition.  Will get help right away if you are not doing well or get worse. Document Released: 08/22/2012 Document Reviewed: 08/22/2012 Exit[employer] Patient Information 2014 [employer].  

## 2013-10-04 NOTE — Anesthesia Procedure Notes (Signed)
Procedure Name: Intubation Date/Time: 10/04/2013 10:39 AM Performed by: Charmaine Downs Pre-anesthesia Checklist: Suction available, Emergency Drugs available, Patient identified and Patient being monitored Patient Re-evaluated:Patient Re-evaluated prior to inductionOxygen Delivery Method: Circle system utilized Preoxygenation: Pre-oxygenation with 100% oxygen Intubation Type: IV induction and Cricoid Pressure applied Ventilation: Mask ventilation without difficulty Laryngoscope Size: Mac and 3 Grade View: Grade I Tube type: Oral Tube size: 7.0 mm Number of attempts: 1 Airway Equipment and Method: Stylet and Oral airway Placement Confirmation: ETT inserted through vocal cords under direct vision,  breath sounds checked- equal and bilateral and positive ETCO2 Secured at: 22 cm Tube secured with: Tape Dental Injury: Teeth and Oropharynx as per pre-operative assessment

## 2013-10-04 NOTE — Transfer of Care (Signed)
Immediate Anesthesia Transfer of Care Note  Patient: Deborah Barnes  Procedure(s) Performed: Procedure(s): HERNIA REPAIR INCISIONAL WITH MESH (N/A)  Patient Location: PACU  Anesthesia Type:General  Level of Consciousness: awake and patient cooperative  Airway & Oxygen Therapy: Patient Spontanous Breathing and Patient connected to face mask oxygen  Post-op Assessment: Report given to PACU RN, Post -op Vital signs reviewed and stable and Patient moving all extremities  Post vital signs: Reviewed and stable  Complications: No apparent anesthesia complications

## 2013-10-04 NOTE — Op Note (Signed)
Patient:  Deborah Barnes  DOB:  1975/11/24  MRN:  [id number]   Preop Diagnosis:  Incisional hernia  Postop Diagnosis:  Same  Procedure:  Incisional herniorrhaphy with mesh  Surgeon:  Aviva Signs, M.D.  Anes:  General  Indications:  Patient is a 38 year old white female status post laparoscopic cholecystectomy in 2014 who now presents with an incisional hernia at the epigastric trocar site. The risks and benefits of the procedure including bleeding, infection, mesh placement, possibly recurrence of the hernia were fully explained to the patient, who gave informed consent.  Procedure note:  The patient is placed the supine position. After induction of general endotracheal anesthesia, the abdomen was prepped and draped using usual sterile technique with DuraPrep. Surgical site confirmation was performed.  A transverse incision was made to the previous epigastric trocar site. Dissection was taken down to the fascia. The patient did indeed have a 1.5 cm defect with adipose tissue extending from the abdominal cavity into the hernia. The hernia was reduced and day 4.3 cm small circle Ventralex ST patch was inserted and secured to the fascia using 0 Ethibond interrupted sutures. In addition, the fascia was reapproximated transversely using 0 Ethibond interrupted sutures. The subcutaneous layer was reapproximated using 3-0 Vicryl interrupted suture. The skin was closed using staples. 0.5% Sensorcaine was instilled the surrounding wound. Betadine ointment and dry sterile dressings were applied.  All tape and needle counts were correct the end of the procedure. The patient was awakened in the operating room and transferred to PACU in stable condition.  Complications:  None  EBL:  Minimal  Specimen:  None

## 2013-10-04 NOTE — Anesthesia Postprocedure Evaluation (Signed)
  Anesthesia Post-op Note  Patient: Deborah Barnes  Procedure(s) Performed: Procedure(s): HERNIA REPAIR INCISIONAL WITH MESH (N/A)  Patient Location: PACU  Anesthesia Type:General  Level of Consciousness: awake, alert , oriented and patient cooperative  Airway and Oxygen Therapy: Patient Spontanous Breathing  Post-op Pain: 4 /10, moderate  Post-op Assessment: Post-op Vital signs reviewed, Patient's Cardiovascular Status Stable, Respiratory Function Stable and Patent Airway  Post-op Vital Signs: Reviewed and stable  Complications: No apparent anesthesia complications

## 2013-10-04 NOTE — Anesthesia Preprocedure Evaluation (Signed)
Anesthesia Evaluation  Patient identified by MRN, date of birth, ID band Patient awake    Reviewed: Allergy & Precautions, H&P , NPO status , Patient's Chart, lab work & pertinent test results  Airway Mallampati: II TM Distance: >3 FB Neck ROM: Full    Dental  (+) Teeth Intact   Pulmonary shortness of breath, asthma , COPDCurrent Smoker,  breath sounds clear to auscultation        Cardiovascular hypertension, Pt. on medications Rhythm:Regular Rate:Normal     Neuro/Psych PSYCHIATRIC DISORDERS Bipolar Disorder    GI/Hepatic GERD-  Medicated and Controlled,  Endo/Other  Morbid obesity  Renal/GU      Musculoskeletal   Abdominal   Peds  Hematology   Anesthesia Other Findings   Reproductive/Obstetrics                           Anesthesia Physical Anesthesia Plan  ASA: III  Anesthesia Plan: General   Post-op Pain Management:    Induction: Intravenous, Rapid sequence and Cricoid pressure planned  Airway Management Planned: Oral ETT  Additional Equipment:   Intra-op Plan:   Post-operative Plan: Extubation in OR  Informed Consent: I have reviewed the patients History and Physical, chart, labs and discussed the procedure including the risks, benefits and alternatives for the proposed anesthesia with the patient or authorized representative who has indicated his/her understanding and acceptance.     Plan Discussed with:   Anesthesia Plan Comments:         Anesthesia Quick Evaluation

## 2013-10-08 ENCOUNTER — Encounter (HOSPITAL_COMMUNITY): Payer: Self-pay | Admitting: General Surgery

## 2013-12-24 ENCOUNTER — Ambulatory Visit (INDEPENDENT_AMBULATORY_CARE_PROVIDER_SITE_OTHER): Payer: Medicaid Other | Admitting: General Surgery

## 2013-12-24 ENCOUNTER — Encounter (INDEPENDENT_AMBULATORY_CARE_PROVIDER_SITE_OTHER): Payer: Self-pay | Admitting: General Surgery

## 2013-12-24 VITALS — BP 122/80 | HR 78 | Temp 97.8°F | Ht 65.0 in | Wt 233.6 lb

## 2013-12-24 DIAGNOSIS — K602 Anal fissure, unspecified: Secondary | ICD-10-CM | POA: Insufficient documentation

## 2013-12-24 NOTE — Patient Instructions (Signed)
Use diltiazem cream 4 times a day Baby wipes after bm's Warm tub soaks 2-3 times a day Colace stool softener twice a day
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
## 2014-01-06 ENCOUNTER — Encounter (HOSPITAL_COMMUNITY): Payer: Self-pay | Admitting: Emergency Medicine

## 2014-01-06 ENCOUNTER — Emergency Department (HOSPITAL_COMMUNITY)
Admission: EM | Admit: 2014-01-06 | Discharge: 2014-01-06 | Discharge status: Against Medical Advice | Payer: Medicaid Other | Attending: Emergency Medicine | Admitting: Emergency Medicine

## 2014-01-06 DIAGNOSIS — R110 Nausea: Secondary | ICD-10-CM | POA: Insufficient documentation

## 2014-01-06 DIAGNOSIS — R197 Diarrhea, unspecified: Secondary | ICD-10-CM | POA: Insufficient documentation

## 2014-01-06 DIAGNOSIS — R5383 Other fatigue: Secondary | ICD-10-CM

## 2014-01-06 DIAGNOSIS — J449 Chronic obstructive pulmonary disease, unspecified: Secondary | ICD-10-CM | POA: Insufficient documentation

## 2014-01-06 DIAGNOSIS — F17200 Nicotine dependence, unspecified, uncomplicated: Secondary | ICD-10-CM | POA: Insufficient documentation

## 2014-01-06 DIAGNOSIS — R5381 Other malaise: Secondary | ICD-10-CM | POA: Insufficient documentation

## 2014-01-06 DIAGNOSIS — J4489 Other specified chronic obstructive pulmonary disease: Secondary | ICD-10-CM | POA: Insufficient documentation

## 2014-01-06 DIAGNOSIS — I10 Essential (primary) hypertension: Secondary | ICD-10-CM | POA: Insufficient documentation

## 2014-01-06 LAB — BASIC METABOLIC PANEL
BUN: 8 mg/dL (ref 6–23)
CALCIUM: 9.5 mg/dL (ref 8.4–10.5)
CO2: 22 meq/L (ref 19–32)
Chloride: 102 mEq/L (ref 96–112)
Creatinine, Ser: 0.6 mg/dL (ref 0.50–1.10)
GFR calc Af Amer: >90 mL/min (ref >90)
GFR calc non Af Amer: >90 mL/min (ref >90)
GLUCOSE: 94 mg/dL (ref 70–99)
Potassium: 3.7 mEq/L (ref 3.7–5.3)
Sodium: 139 mEq/L (ref 137–147)

## 2014-01-06 LAB — CBC WITH DIFFERENTIAL/PLATELET
BASOS ABS: 0 10*3/uL (ref 0.0–0.1)
Basophils Relative: 0 % (ref 0–1)
EOS ABS: 0.1 10*3/uL (ref 0.0–0.7)
EOS PCT: 2 % (ref 0–5)
HCT: 43.7 % (ref 36.0–46.0)
Hemoglobin: 14.8 g/dL (ref 12.0–15.0)
LYMPHS ABS: 2.6 10*3/uL (ref 0.7–4.0)
Lymphocytes Relative: 29 % (ref 12–46)
MCH: 31.3 pg (ref 26.0–34.0)
MCHC: 33.9 g/dL (ref 30.0–36.0)
MCV: 92.4 fL (ref 78.0–100.0)
Monocytes Absolute: 0.4 10*3/uL (ref 0.1–1.0)
Monocytes Relative: 4 % (ref 3–12)
Neutro Abs: 6 10*3/uL (ref 1.7–7.7)
Neutrophils Relative %: 65 % (ref 43–77)
PLATELETS: 362 10*3/uL (ref 150–400)
RBC: 4.73 MIL/uL (ref 3.87–5.11)
RDW: 13.5 % (ref 11.5–15.5)
WBC: 9.1 10*3/uL (ref 4.0–10.5)

## 2014-01-06 NOTE — ED Notes (Signed)
Diarrhea since last night, nausea, no vomiting.  Feels weak

## 2014-01-20 ENCOUNTER — Encounter (HOSPITAL_COMMUNITY): Payer: Self-pay | Admitting: Emergency Medicine

## 2014-01-20 ENCOUNTER — Emergency Department (HOSPITAL_COMMUNITY): Payer: Medicaid Other

## 2014-01-20 ENCOUNTER — Emergency Department (HOSPITAL_COMMUNITY)
Admission: EM | Admit: 2014-01-20 | Discharge: 2014-01-20 | Disposition: A | Discharge status: Home Health | Payer: Medicaid Other | Attending: Emergency Medicine | Admitting: Emergency Medicine

## 2014-01-20 DIAGNOSIS — F17200 Nicotine dependence, unspecified, uncomplicated: Secondary | ICD-10-CM | POA: Insufficient documentation

## 2014-01-20 DIAGNOSIS — J449 Chronic obstructive pulmonary disease, unspecified: Secondary | ICD-10-CM | POA: Insufficient documentation

## 2014-01-20 DIAGNOSIS — L7682 Other postprocedural complications of skin and subcutaneous tissue: Secondary | ICD-10-CM

## 2014-01-20 DIAGNOSIS — Z8739 Personal history of other diseases of the musculoskeletal system and connective tissue: Secondary | ICD-10-CM | POA: Insufficient documentation

## 2014-01-20 DIAGNOSIS — F319 Bipolar disorder, unspecified: Secondary | ICD-10-CM | POA: Insufficient documentation

## 2014-01-20 DIAGNOSIS — Z862 Personal history of diseases of the blood and blood-forming organs and certain disorders involving the immune mechanism: Secondary | ICD-10-CM | POA: Insufficient documentation

## 2014-01-20 DIAGNOSIS — Z9889 Other specified postprocedural states: Secondary | ICD-10-CM | POA: Insufficient documentation

## 2014-01-20 DIAGNOSIS — J4489 Other specified chronic obstructive pulmonary disease: Secondary | ICD-10-CM | POA: Insufficient documentation

## 2014-01-20 DIAGNOSIS — R109 Unspecified abdominal pain: Secondary | ICD-10-CM

## 2014-01-20 DIAGNOSIS — R110 Nausea: Secondary | ICD-10-CM | POA: Insufficient documentation

## 2014-01-20 DIAGNOSIS — Z3202 Encounter for pregnancy test, result negative: Secondary | ICD-10-CM | POA: Insufficient documentation

## 2014-01-20 DIAGNOSIS — Z79899 Other long term (current) drug therapy: Secondary | ICD-10-CM | POA: Insufficient documentation

## 2014-01-20 DIAGNOSIS — Z9851 Tubal ligation status: Secondary | ICD-10-CM | POA: Insufficient documentation

## 2014-01-20 DIAGNOSIS — Z9089 Acquired absence of other organs: Secondary | ICD-10-CM | POA: Insufficient documentation

## 2014-01-20 DIAGNOSIS — R1011 Right upper quadrant pain: Secondary | ICD-10-CM | POA: Insufficient documentation

## 2014-01-20 DIAGNOSIS — Z8639 Personal history of other endocrine, nutritional and metabolic disease: Secondary | ICD-10-CM | POA: Insufficient documentation

## 2014-01-20 DIAGNOSIS — Z791 Long term (current) use of non-steroidal anti-inflammatories (NSAID): Secondary | ICD-10-CM | POA: Insufficient documentation

## 2014-01-20 DIAGNOSIS — K219 Gastro-esophageal reflux disease without esophagitis: Secondary | ICD-10-CM | POA: Insufficient documentation

## 2014-01-20 DIAGNOSIS — I10 Essential (primary) hypertension: Secondary | ICD-10-CM | POA: Insufficient documentation

## 2014-01-20 DIAGNOSIS — G8918 Other acute postprocedural pain: Secondary | ICD-10-CM | POA: Insufficient documentation

## 2014-01-20 LAB — COMPREHENSIVE METABOLIC PANEL
ALBUMIN: 4.1 g/dL (ref 3.5–5.2)
ALT: 16 U/L (ref 0–35)
AST: 15 U/L (ref 0–37)
Alkaline Phosphatase: 83 U/L (ref 39–117)
BILIRUBIN TOTAL: 0.2 mg/dL — AB (ref 0.3–1.2)
BUN: 9 mg/dL (ref 6–23)
CALCIUM: 10 mg/dL (ref 8.4–10.5)
CO2: 29 mEq/L (ref 19–32)
CREATININE: 0.62 mg/dL (ref 0.50–1.10)
Chloride: 97 mEq/L (ref 96–112)
GFR calc Af Amer: >90 mL/min (ref >90)
GFR calc non Af Amer: >90 mL/min (ref >90)
Glucose, Bld: 101 mg/dL — ABNORMAL HIGH (ref 70–99)
Potassium: 4 mEq/L (ref 3.7–5.3)
Sodium: 137 mEq/L (ref 137–147)
Total Protein: 7.6 g/dL (ref 6.0–8.3)

## 2014-01-20 LAB — URINALYSIS, ROUTINE W REFLEX MICROSCOPIC
Bilirubin Urine: NEGATIVE
GLUCOSE, UA: NEGATIVE mg/dL
HGB URINE DIPSTICK: NEGATIVE
KETONES UR: NEGATIVE mg/dL
Leukocytes, UA: NEGATIVE
Nitrite: NEGATIVE
Protein, ur: NEGATIVE mg/dL
Specific Gravity, Urine: 1.01 (ref 1.005–1.030)
UROBILINOGEN UA: 0.2 mg/dL (ref 0.0–1.0)
pH: 7 (ref 5.0–8.0)

## 2014-01-20 LAB — CBC WITH DIFFERENTIAL/PLATELET
BASOS PCT: 0 % (ref 0–1)
Basophils Absolute: 0 10*3/uL (ref 0.0–0.1)
EOS PCT: 2 % (ref 0–5)
Eosinophils Absolute: 0.2 10*3/uL (ref 0.0–0.7)
HCT: 40.6 % (ref 36.0–46.0)
HEMOGLOBIN: 14.2 g/dL (ref 12.0–15.0)
Lymphocytes Relative: 25 % (ref 12–46)
Lymphs Abs: 3 10*3/uL (ref 0.7–4.0)
MCH: 31.5 pg (ref 26.0–34.0)
MCHC: 35 g/dL (ref 30.0–36.0)
MCV: 90 fL (ref 78.0–100.0)
MONO ABS: 0.5 10*3/uL (ref 0.1–1.0)
MONOS PCT: 4 % (ref 3–12)
Neutro Abs: 8.3 10*3/uL — ABNORMAL HIGH (ref 1.7–7.7)
Neutrophils Relative %: 69 % (ref 43–77)
Platelets: 366 10*3/uL (ref 150–400)
RBC: 4.51 MIL/uL (ref 3.87–5.11)
RDW: 13.2 % (ref 11.5–15.5)
WBC: 12.1 10*3/uL — ABNORMAL HIGH (ref 4.0–10.5)

## 2014-01-20 LAB — LIPASE, BLOOD: Lipase: 32 U/L (ref 11–59)

## 2014-01-20 LAB — PREGNANCY, URINE: Preg Test, Ur: NEGATIVE

## 2014-01-20 MED ORDER — ONDANSETRON HCL 4 MG/2ML IJ SOLN
4.0000 mg | Freq: Once | INTRAMUSCULAR | Status: AC
  Administered 2014-01-20: 4 mg via INTRAVENOUS
  Filled 2014-01-20: qty 2

## 2014-01-20 MED ORDER — CYCLOBENZAPRINE HCL 10 MG PO TABS: 10.0000 mg | ORAL_TABLET | Freq: Three times a day (TID) | ORAL | Status: DC | PRN

## 2014-01-20 MED ORDER — IOHEXOL 300 MG/ML  SOLN
100.0000 mL | Freq: Once | INTRAMUSCULAR | Status: AC | PRN
  Administered 2014-01-20: 100 mL via INTRAVENOUS

## 2014-01-20 MED ORDER — IOHEXOL 300 MG/ML  SOLN
25.0000 mL | Freq: Once | INTRAMUSCULAR | Status: AC | PRN
  Administered 2014-01-20: 25 mL via ORAL

## 2014-01-20 MED ORDER — FENTANYL CITRATE 0.05 MG/ML IJ SOLN
50.0000 ug | Freq: Once | INTRAMUSCULAR | Status: AC
  Administered 2014-01-20: 50 ug via INTRAVENOUS
  Filled 2014-01-20 (×2): qty 2

## 2014-01-20 MED ORDER — NAPROXEN 500 MG PO TABS: 500.0000 mg | ORAL_TABLET | Freq: Two times a day (BID) | ORAL | Status: DC

## 2014-01-20 MED ORDER — SODIUM CHLORIDE 0.9 % IV SOLN
INTRAVENOUS | Status: DC
  Administered 2014-01-20: 13:00:00 via INTRAVENOUS

## 2014-01-20 NOTE — ED Provider Notes (Signed)
CSN: [id number]     Arrival date & time 01/20/14  8101 History   /This chart was scribed for Janice Norrie, MD by Lovena Le Day, ED scribe. This patient was seen in room APA03/APA03 and the patient's care was started at 0959.  Chief Complaint  Patient presents with  . Abdominal Pain   The history is provided by the patient. No language interpreter was used.   HPI Comments: Deborah Barnes is a 38 y.o. female who presents to the Emergency Department for right upper quadrant abdominal pain which she reports feels similar but not as worse to a previous hernia repair surgery in January when she had a mesh placed by Dr. Arnoldo Morale in same area she is having pain today. She reports she had her gallbladder removed in August 2014. She then developed an incisional hernia that was repaired in January. She reports pain occurred last PM, after lifting a large iron skillet from the stove, began mild pain and then she dropped the pan, and the pain worsened throughout the night. The pain is located in the RUQ and she feels like it may be swollen. She states she has nausea but no emesis. She states her baseline diarrhea this AM. She denies fever. Her pain is worse w/sitting up. She reports not as big swelling as last time. She ate taco shells this AM, made abdominal pain somewhat worse.   PCP Dr. [name]   Past Medical History  Diagnosis Date  . Hypertension   . Hypercholesterolemia   . GERD (gastroesophageal reflux disease)   . Back pain   . Bipolar 1 disorder   . Bulging disc   . Degenerative disc disease   . COPD (chronic obstructive pulmonary disease)   . Asthma   . Shortness of breath    Past Surgical History  Procedure Laterality Date  . Carpal tunnel release Bilateral   . Tubal ligation    . Cholecystectomy N/A 04/26/2013    Procedure: LAPAROSCOPIC CHOLECYSTECTOMY;  Surgeon: Jamesetta So, MD;  Location: [facility];  Service: General;  Laterality: N/A;  . Incisional hernia repair N/A 10/04/2013     Procedure: HERNIA REPAIR INCISIONAL WITH MESH;  Surgeon: Jamesetta So, MD;  Location: [facility];  Service: General;  Laterality: N/A;  . Insertion of mesh N/A 10/04/2013    Procedure: INSERTION OF MESH;  Surgeon: Jamesetta So, MD;  Location: [facility];  Service: General;  Laterality: N/A;   Family History  Problem Relation Age of Onset  . Diabetes Father    History  Substance Use Topics  . Smoking status: Current Every Day Smoker -- 1.00 packs/day for 20 years    Types: Cigarettes  . Smokeless tobacco: Never Used  . Alcohol Use: No   She smokes 1ppd. She drinks once per month.  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  Respiratory: Negative for cough and shortness of breath.   Cardiovascular: Negative for chest pain.  Gastrointestinal: Positive for nausea and abdominal pain. Negative for vomiting.  Musculoskeletal: Negative for back pain.  Skin: Negative for rash.  All other systems reviewed and are negative.   Allergies  Clarithromycin and Darvocet  Home Medications   Prior to Admission medications   Medication Sig Start Date End  Date Taking? Authorizing Provider  albuterol (PROVENTIL HFA;VENTOLIN HFA) 108 (90 BASE) MCG/ACT inhaler Inhale 2 puffs into the lungs every 6 (six) hours as needed for wheezing.   Yes Historical Provider, MD  atenolol (TENORMIN) 25 MG tablet Take 25 mg by mouth daily.   Yes Historical Provider, MD  carbamazepine (TEGRETOL) 200 MG tablet Take 200 mg by mouth 2 (two) times daily. for MOOD SWINGS    Yes Historical Provider, MD  cetirizine (ZYRTEC) 10 MG tablet Take 10 mg by mouth daily. For ALLERGIES   Yes Historical Provider, MD  cholecalciferol (VITAMIN D) 1000 UNITS tablet Take 5,000 Units by mouth daily.   Yes Historical Provider, MD  diazepam (VALIUM) 5 MG tablet Take 5 mg by mouth 3  (three) times daily.   Yes Historical Provider, MD  escitalopram (LEXAPRO) 10 MG tablet Take 10 mg by mouth daily. For DEPRESSION   Yes Historical Provider, MD  hydrochlorothiazide (HYDRODIURIL) 25 MG tablet Take 25 mg by mouth daily. For FLUID   Yes Historical Provider, MD  ibuprofen (ADVIL,MOTRIN) 200 MG tablet Take 400 mg by mouth 2 (two) times daily as needed for moderate pain.   Yes Historical Provider, MD  lamoTRIgine (LAMICTAL) 200 MG tablet Take 200 mg by mouth daily. For BIPOLAR   Yes Historical Provider, MD  naphazoline-pheniramine (NAPHCON-A) 0.025-0.3 % ophthalmic solution Place 1-2 drops into both eyes daily as needed for allergies.   Yes Historical Provider, MD  naproxen sodium (ANAPROX) 220 MG tablet Take 220-440 mg by mouth daily as needed (pain).   Yes Historical Provider, MD  omeprazole (PRILOSEC) 20 MG capsule Take 20 mg by mouth 2 (two) times daily. For GERD   Yes Historical Provider, MD  oxyCODONE-acetaminophen (PERCOCET) 7.5-325 MG per tablet Take 1-2 tablets by mouth every 4 (four) hours as needed for pain. 10/04/13  Yes Jamesetta So, MD   Triage Vitals: BP 129/86  Pulse 68  Temp(Src) 97.9 F (36.6 C) (Oral)  Resp 18  SpO2 100%  LMP 11/29/2013  Vital signs normal    Physical Exam  Nursing note and vitals reviewed. Constitutional: She is oriented to person, place, and time. She appears well-developed and well-nourished.  Non-toxic appearance. She does not appear ill. No distress.  HENT:  Head: Normocephalic and atraumatic.  Right Ear: External ear normal.  Left Ear: External ear normal.  Nose: Nose normal. No mucosal edema or rhinorrhea.  Mouth/Throat: Oropharynx is clear and moist and mucous membranes are normal. No dental abscesses or uvula swelling.  Eyes: Conjunctivae and EOM are normal. Pupils are equal, round, and reactive to light.  Neck: Normal range of motion and full passive range of motion without pain. Neck supple.  Cardiovascular: Normal rate,  regular rhythm and normal heart sounds.  Exam reveals no gallop and no friction rub.   No murmur heard. Pulmonary/Chest: Effort normal and breath sounds normal. No respiratory distress. She has no wheezes. She has no rhonchi. She has no rales. She exhibits no tenderness and no crepitus.  Abdominal: Soft. Normal appearance and bowel sounds are normal. She exhibits no distension. There is tenderness. There is no rebound and no guarding.    RUQ abdominal tenderness to palpation. With straining, no obvious mass or bulging.  Musculoskeletal: Normal range of motion. She exhibits no edema and no tenderness.  Moves all extremities well.   Neurological: She is alert and oriented to person, place, and time. She has normal strength. No cranial nerve deficit.  Skin: Skin is warm, dry and  intact. No rash noted. No erythema. No pallor.  Psychiatric: She has a normal mood and affect. Her speech is normal and behavior is normal. Her mood appears not anxious.   ED Course  Procedures (including critical care time)  Medications  0.9 %  sodium chloride infusion ( Intravenous New Bag/Given 01/20/14 1235)  fentaNYL (SUBLIMAZE) injection 50 mcg (50 mcg Intravenous Given 01/20/14 1236)  ondansetron (ZOFRAN) injection 4 mg (4 mg Intravenous Given 01/20/14 1235)  iohexol (OMNIPAQUE) 300 MG/ML solution 25 mL (25 mLs Oral Contrast Given 01/20/14 1214)  iohexol (OMNIPAQUE) 300 MG/ML solution 100 mL (100 mLs Intravenous Contrast Given 01/20/14 1304)    DIAGNOSTIC STUDIES: Oxygen Saturation is 100% on room air, normal by my interpretation.    COORDINATION OF CARE: At 1155 AM Discussed treatment plan with patient which includes iohexol, sublimaze, zofran, abdominal CT, blood work, UA. Patient agrees.   PT given her test results, she is relieved her hernia has not returned  Review of the [place] shows patient received #120 hydrocodone 10/325 on November 17 from her PCP, #120 hydrocodone 10/325 on December 15,  #50 oxycodone 7.5/325 on January 8 from Dr. Arnoldo Morale, #120 hydrocodone 10/325 on January 14, #50 oxycodone 7.5/325 on January 16 from Dr Arnoldo Morale, #120 hydrocodone 10/325 on February 12, #120 hydrocodone 10/325 on March 13, #14 oxycodone 10 mg tablets on March 24, and #30 oxycodone 5/325 on April 7 from Dr. Marlou Starks. Unless noted her first prescription were prescribed by her PCP. Patient states she saw Dr. Marlou Starks for hemorrhoids.  Labs Review Results for orders placed during the hospital encounter of 01/20/14  URINALYSIS, ROUTINE W REFLEX MICROSCOPIC      Result Value Ref Range   Color, Urine YELLOW  YELLOW   APPearance CLEAR  CLEAR   Specific Gravity, Urine 1.010  1.005 - 1.030   pH 7.0  5.0 - 8.0   Glucose, UA NEGATIVE  NEGATIVE mg/dL   Hgb urine dipstick NEGATIVE  NEGATIVE   Bilirubin Urine NEGATIVE  NEGATIVE   Ketones, ur NEGATIVE  NEGATIVE mg/dL   Protein, ur NEGATIVE  NEGATIVE mg/dL   Urobilinogen, UA 0.2  0.0 - 1.0 mg/dL   Nitrite NEGATIVE  NEGATIVE   Leukocytes, UA NEGATIVE  NEGATIVE  PREGNANCY, URINE      Result Value Ref Range   Preg Test, Ur NEGATIVE  NEGATIVE  CBC WITH DIFFERENTIAL      Result Value Ref Range   WBC 12.1 (*) 4.0 - 10.5 K/uL   RBC 4.51  3.87 - 5.11 MIL/uL   Hemoglobin 14.2  12.0 - 15.0 g/dL   HCT 40.6  36.0 - 46.0 %   MCV 90.0  78.0 - 100.0 fL   MCH 31.5  26.0 - 34.0 pg   MCHC 35.0  30.0 - 36.0 g/dL   RDW 13.2  11.5 - 15.5 %   Platelets 366  150 - 400 K/uL   Neutrophils Relative % 69  43 - 77 %   Neutro Abs 8.3 (*) 1.7 - 7.7 K/uL   Lymphocytes Relative 25  12 - 46 %   Lymphs Abs 3.0  0.7 - 4.0 K/uL   Monocytes Relative 4  3 - 12 %   Monocytes Absolute 0.5  0.1 - 1.0 K/uL   Eosinophils Relative 2  0 - 5 %   Eosinophils Absolute 0.2  0.0 - 0.7 K/uL   Basophils Relative 0  0 - 1 %   Basophils Absolute 0.0  0.0 - 0.1 K/uL  COMPREHENSIVE METABOLIC PANEL      Result Value Ref Range   Sodium 137  137 - 147 mEq/L   Potassium 4.0  3.7 - 5.3 mEq/L   Chloride 97   96 - 112 mEq/L   CO2 29  19 - 32 mEq/L   Glucose, Bld 101 (*) 70 - 99 mg/dL   BUN 9  6 - 23 mg/dL   Creatinine, Ser 0.62  0.50 - 1.10 mg/dL   Calcium 10.0  8.4 - 10.5 mg/dL   Total Protein 7.6  6.0 - 8.3 g/dL   Albumin 4.1  3.5 - 5.2 g/dL   AST 15  0 - 37 U/L   ALT 16  0 - 35 U/L   Alkaline Phosphatase 83  39 - 117 U/L   Total Bilirubin 0.2 (*) 0.3 - 1.2 mg/dL   GFR calc non Af Amer >90  >90 mL/min   GFR calc Af Amer >90  >90 mL/min  LIPASE, BLOOD      Result Value Ref Range   Lipase 32  11 - 59 U/L    Laboratory interpretation all normal except leukocytosis   Ct Abdomen Pelvis W Contrast  01/20/2014   CLINICAL DATA:  Right upper quadrant pain since last night  EXAM: CT ABDOMEN AND PELVIS WITH CONTRAST  TECHNIQUE: Multidetector CT imaging of the abdomen and pelvis was performed using the standard protocol following bolus administration of intravenous contrast.  CONTRAST:  107mL OMNIPAQUE IOHEXOL 300 MG/ML SOLN, 153mL OMNIPAQUE IOHEXOL 300 MG/ML SOLN  COMPARISON:  04/25/2013  FINDINGS: The lung bases are clear. No pleural or pericardial effusion identified. No liver abnormality. Previous coli cystectomy. Mild increase caliber of the common bile duct which measures up to 1 cm. No obstructing stone or mass noted. The pancreas is unremarkable. Normal appearance of the spleen.  The adrenal glands are both normal. 1.2 cm cyst is identified within the midpole of left kidney, image 38/series 2. The urinary bladder is normal. Small bilateral ovarian cysts are noted measuring up to 1.9 cm.  Normal caliber of the abdominal aorta. There is no aneurysm. No pathologically enlarged upper abdominal lymph nodes. There are no enlarged pelvic or inguinal lymph nodes. No ascites.  The stomach appears normal. The small bowel loops are unremarkable. The appendix is visualized and appears normal. Normal appearance of the colon.  Review of the visualized osseous structures is significant for multi level lumbar  spondylosis.  IMPRESSION: 1. No acute findings within the abdomen or pelvis.   Electronically Signed   By: Kerby Moors M.D.   On: 01/20/2014 13:50      Imaging Review    EKG Interpretation None      MDM   Final diagnoses:  Abdominal pain  Incisional pain   Discharge Medication List as of 01/20/2014  3:04 PM    START taking these medications   Details  cyclobenzaprine (FLEXERIL) 10 MG tablet Take 1 tablet (10 mg total) by mouth 3 (three) times daily as needed for muscle spasms., Starting 01/20/2014, Until Discontinued, Print    naproxen (NAPROSYN) 500 MG tablet Take 1 tablet (500 mg total) by mouth 2 (two) times daily., Starting 01/20/2014, Until Discontinued, Print        Plan discharge  Rolland Porter, MD, FACEP    I personally performed the services described in this documentation, which was scribed in my presence. The recorded information has been reviewed and considered.  Rolland Porter, MD, [place],  MD 01/20/14 1535

## 2014-01-20 NOTE — Discharge Instructions (Signed)
Your CT scan shows you do not have the hernia again. You have incisional pain. You can take the prescribed medications and follow up with Dr Arnoldo Morale if the pain continues.

## 2014-01-20 NOTE — ED Notes (Signed)
C/o ": hernia pain" to rt upper abd where she had previous hernia repair.

## 2014-01-24 ENCOUNTER — Encounter (INDEPENDENT_AMBULATORY_CARE_PROVIDER_SITE_OTHER): Payer: Medicaid Other | Admitting: General Surgery

## 2014-03-10 ENCOUNTER — Ambulatory Visit: Payer: Medicaid Other | Admitting: Physical Therapy

## 2014-03-18 ENCOUNTER — Ambulatory Visit: Payer: Medicaid Other | Attending: *Deleted | Admitting: Physical Therapy

## 2014-03-18 DIAGNOSIS — R5381 Other malaise: Secondary | ICD-10-CM | POA: Diagnosis not present

## 2014-03-18 DIAGNOSIS — I10 Essential (primary) hypertension: Secondary | ICD-10-CM | POA: Insufficient documentation

## 2014-03-18 DIAGNOSIS — M51379 Other intervertebral disc degeneration, lumbosacral region without mention of lumbar back pain or lower extremity pain: Secondary | ICD-10-CM | POA: Insufficient documentation

## 2014-03-18 DIAGNOSIS — M5137 Other intervertebral disc degeneration, lumbosacral region: Secondary | ICD-10-CM | POA: Diagnosis not present

## 2014-03-18 DIAGNOSIS — M545 Low back pain, unspecified: Secondary | ICD-10-CM | POA: Insufficient documentation

## 2014-03-18 DIAGNOSIS — IMO0001 Reserved for inherently not codable concepts without codable children: Secondary | ICD-10-CM | POA: Diagnosis present

## 2014-06-04 ENCOUNTER — Encounter: Payer: Self-pay | Admitting: Gastroenterology

## 2014-07-03 ENCOUNTER — Ambulatory Visit: Payer: Medicaid Other | Admitting: Gastroenterology

## 2014-07-17 ENCOUNTER — Ambulatory Visit: Payer: Medicaid Other | Admitting: Gastroenterology

## 2014-07-21 ENCOUNTER — Encounter (HOSPITAL_COMMUNITY): Payer: Self-pay | Admitting: Emergency Medicine

## 2014-08-26 ENCOUNTER — Ambulatory Visit: Payer: Medicaid Other | Admitting: Gastroenterology

## 2014-08-27 ENCOUNTER — Other Ambulatory Visit: Payer: Self-pay

## 2014-08-27 ENCOUNTER — Encounter: Payer: Self-pay | Admitting: Gastroenterology

## 2014-08-27 ENCOUNTER — Ambulatory Visit (INDEPENDENT_AMBULATORY_CARE_PROVIDER_SITE_OTHER): Payer: Medicaid Other | Admitting: Gastroenterology

## 2014-08-27 VITALS — BP 127/85 | HR 85 | Temp 97.1°F | Ht 65.0 in | Wt 235.4 lb

## 2014-08-27 DIAGNOSIS — G8929 Other chronic pain: Secondary | ICD-10-CM | POA: Insufficient documentation

## 2014-08-27 DIAGNOSIS — R1013 Epigastric pain: Secondary | ICD-10-CM

## 2014-08-27 DIAGNOSIS — R195 Other fecal abnormalities: Secondary | ICD-10-CM | POA: Insufficient documentation

## 2014-08-27 MED ORDER — DICYCLOMINE HCL 10 MG PO CAPS: 10.0000 mg | ORAL_CAPSULE | Freq: Three times a day (TID) | ORAL | Status: DC

## 2014-08-27 MED ORDER — PEG-KCL-NACL-NASULF-NA ASC-C 100 G PO SOLR: 1.0000 | ORAL | Status: DC

## 2014-08-27 MED ORDER — PANTOPRAZOLE SODIUM 40 MG PO TBEC: 40.0000 mg | DELAYED_RELEASE_TABLET | Freq: Every day | ORAL | Status: DC

## 2014-08-27 NOTE — Progress Notes (Signed)
cc'ed to pcp °

## 2014-08-27 NOTE — Assessment & Plan Note (Signed)
Persisting even after cholecystectomy with associated N/V in the setting of large amounts of ibuprofen use chronically. Concern for gastritis, PUD. Prilosec chronically.   Will switch to Protonix 40 mg daily.  Proceed with upper endoscopy in the near future with Dr. Gala Romney. The risks, benefits, and alternatives have been discussed in detail with patient. They have stated understanding and desire to proceed.  PROPOFOL due to polypharmacy

## 2014-08-27 NOTE — Progress Notes (Signed)
    Primary Care Physician:  Jones, Angel S, PA-C Primary Gastroenterologist:  Dr. Rourk   Chief Complaint  Patient presents with  . Nausea  . Abdominal Pain  . Emesis    HPI:   Deborah Barnes presents today at the request of Angel Jones, PA, secondary to chronic abdominal pain, N/V, diarrhea. Epigastric pain exacerbated by times with eating but can occur without relation to food. Associated N/V. Will have episodes of nausea unrelated to any factors. Phenergan prn. Prilosec BID somewhat controls GERD symptoms. Occasional breakthrough. Takes ibuprofen 800 mg sometimes 2-3 times a day chronically.   Majority of the time has diarrhea. If takes full dosages of oxycodone, may have constipation. Depends on nerve pain in leg how much oxycodone she takes. Diarrhea present even before cholecystectomy. Postprandial urgency. Average day will have 4-7 loose stools per day. Intermittent hematochezia with constipation. Abdominal pain present prior to cholecystectomy and has persisted since then.   Past Medical History  Diagnosis Date  . Hypertension   . Hypercholesterolemia   . GERD (gastroesophageal reflux disease)   . Back pain   . Bipolar 1 disorder   . Bulging disc   . Degenerative disc disease   . COPD (chronic obstructive pulmonary disease)   . Asthma   . Shortness of breath   . Sciatica   . PTSD (post-traumatic stress disorder)   . Depression with anxiety     Past Surgical History  Procedure Laterality Date  . Carpal tunnel release Bilateral   . Tubal ligation    . Cholecystectomy N/A 04/26/2013    Procedure: LAPAROSCOPIC CHOLECYSTECTOMY;  Surgeon: Mark A Jenkins, MD;  Location: [facility];  Service: General;  Laterality: N/A;  . Incisional hernia repair N/A 10/04/2013    Procedure: HERNIA REPAIR INCISIONAL WITH MESH;  Surgeon: Mark A Jenkins, MD;  Location: [facility];  Service: General;  Laterality: N/A;  . Insertion of mesh N/A 10/04/2013    Procedure: INSERTION OF MESH;  Surgeon:  Mark A Jenkins, MD;  Location: [facility];  Service: General;  Laterality: N/A;    Current Outpatient Prescriptions  Medication Sig Dispense Refill  . albuterol (PROVENTIL HFA;VENTOLIN HFA) 108 (90 BASE) MCG/ACT inhaler Inhale 2 puffs into the lungs every 6 (six) hours as needed for wheezing.    . atenolol (TENORMIN) 25 MG tablet Take 25 mg by mouth daily.    . carbamazepine (TEGRETOL) 200 MG tablet Take 200 mg by mouth 2 (two) times daily. for MOOD SWINGS     . cholecalciferol (VITAMIN D) 1000 UNITS tablet Take 5,000 Units by mouth daily.    . cyclobenzaprine (FLEXERIL) 10 MG tablet Take 1 tablet (10 mg total) by mouth 3 (three) times daily as needed for muscle spasms. 30 tablet 0  . diazepam (VALIUM) 5 MG tablet Take 10 mg by mouth 3 (three) times daily.     . escitalopram (LEXAPRO) 10 MG tablet Take 10 mg by mouth daily. For DEPRESSION    . hydrochlorothiazide (HYDRODIURIL) 25 MG tablet Take 25 mg by mouth daily. For FLUID    . ibuprofen (ADVIL,MOTRIN) 200 MG tablet Take 400 mg by mouth 2 (two) times daily as needed for moderate pain.    . lamoTRIgine (LAMICTAL) 200 MG tablet Take 200 mg by mouth daily. For BIPOLAR    . loratadine (CLARITIN) 10 MG tablet Take 10 mg by mouth.    . omeprazole (PRILOSEC) 20 MG capsule Take 20 mg by mouth 2 (two) times daily. For GERD    .   oxycodone (OXY-IR) 5 MG capsule Take 10 mg by mouth every 6 (six) hours as needed.    . promethazine (PHENERGAN) 25 MG tablet Take 25 mg by mouth every 6 (six) hours as needed for nausea or vomiting.     No current facility-administered medications for this visit.    Allergies as of 08/27/2014 - Review Complete 01/20/2014  Allergen Reaction Noted  . Clarithromycin Diarrhea 02/22/2012  . Darvocet [propoxyphene n-acetaminophen] Rash 10/12/2011    Family History  Problem Relation Age of Onset  . Diabetes Father   . Colon cancer Maternal Grandmother     History   Social History  . Marital Status: Divorced    Spouse  Name: N/A    Number of Children: N/A  . Years of Education: N/A   Occupational History  . Not on file.   Social History Main Topics  . Smoking status: Current Every Day Smoker -- 1.00 packs/day for 20 years    Types: Cigarettes  . Smokeless tobacco: Never Used  . Alcohol Use: No     Comment: ETOH abuse in past, quit a few years ago.   . Drug Use: No  . Sexual Activity: Yes    Birth Control/ Protection: Surgical   Other Topics Concern  . Not on file   Social History Narrative    Review of Systems: Gen: see HPI CV: Denies chest pain, heart palpitations, peripheral edema, syncope.  Resp: chronic bronchitis, asthma  GI: see HPI GU : Denies urinary burning, urinary frequency, urinary hesitancy MS: "joint pain"  Derm: Denies rash, itching, dry skin Psych: +depression/anxiety Heme: Denies bruising, bleeding, and enlarged lymph nodes.  Physical Exam: BP 127/85 mmHg  Pulse 85  Temp(Src) 97.1 F (36.2 C)  Ht 5' 5" (1.651 m)  Wt 235 lb 6.4 oz (106.777 kg)  BMI 39.17 kg/m2  LMP 08/17/2014 General:   Alert and oriented. Pleasant and cooperative. Well-nourished and well-developed.  Head:  Normocephalic and atraumatic. Eyes:  Without icterus, sclera clear and conjunctiva pink.  Ears:  Normal auditory acuity. Nose:  No deformity, discharge,  or lesions. Mouth:  No deformity or lesions, oral mucosa pink.  Lungs:  Clear to auscultation bilaterally. No wheezes, rales, or rhonchi. No distress.  Heart:  S1, S2 present without murmurs appreciated.  Abdomen:  +BS, soft, TTP epigastric region and non-distended. No HSM noted. No guarding or rebound. No masses appreciated.  Rectal:  Deferred  Msk:  Symmetrical without gross deformities. Normal posture. Extremities:  Without edema. Neurologic:  Alert and  oriented x4;  grossly normal neurologically. Skin:  Intact without significant lesions or rashes. Psych:  Alert and cooperative. Normal mood and affect.    

## 2014-08-27 NOTE — Assessment & Plan Note (Signed)
38 year old female with chronic loose stools even prior to cholecystectomy; notable postprandial component. Very rare constipation in setting of increased narcotics. Low-volume hematochezia likely benign in this setting but due to symptoms needs further evaluation via colonoscopy.  Proceed with TCS with Dr. Gala Romney in near future: the risks, benefits, and alternatives have been discussed with the patient in detail. The patient states understanding and desires to proceed. PROPOFOL due to polypharmacy Bentyl QID

## 2014-08-27 NOTE — Patient Instructions (Signed)
Stop Prilosec. Start Protonix once each morning, 30 minutes before breakfast. I sent this to the pharmacy.  For loose stools: take Bentyl 1 capsule with meals and at bedtime. Monitor for constipation, dry mouth, dizziness.   We have scheduled you for a colonoscopy and upper endoscopy with Dr. Gala Romney for further evaluation!

## 2014-09-02 ENCOUNTER — Telehealth: Payer: Self-pay | Admitting: Internal Medicine

## 2014-09-02 NOTE — Telephone Encounter (Signed)
Patient was seen on 12/9 by AS and she said that the bentyl isn't helping her any and she is still having diarrhea. Pt is scheduled for tcs with RMR on 1/7 and has concerns about the prep. She said that her cousin had a tcs and was allergic to the prep and was throwing up. I asked her was she sure her cousin was allergic because sometimes if drinking the prep too fast will make you sick to your stomach. She said the doctor said she was allergic and just wanted [place] to be aware since that was family and is worried she could be allergic also. I told her that I would let the nurse know of her concerns and also about the bentyl.  Please advise and call [phone] or [phone]

## 2014-09-04 NOTE — Telephone Encounter (Signed)
Tried to call pt- pt is asleep per her mother and she will have her call me back.

## 2014-09-18 NOTE — Patient Instructions (Addendum)
Deborah Barnes  09/18/2014   Your procedure is scheduled on:  09/25/14  Report to [facility] at 09:30 AM.  Call this number if you have problems the morning of surgery: [phone]   Remember:   Do not eat food or drink liquids after midnight.   Take these medicines the morning of surgery with A SIP OF WATER: Albuterol and bring with you that am, Atenolol, Tegretol, Flexeril, Valium, Bentyl, Lexapro, Lamictal, Claritin,  Protonix, Phenergan   Do not wear jewelry, make-up or nail polish.  Do not wear lotions, powders, or perfumes. You may wear deodorant.  Do not shave 48 hours prior to surgery. Men may shave face and neck.  Do not bring valuables to the hospital.  [facility] is not responsible for any belongings or valuables.               Contacts, dentures or bridgework may not be worn into surgery.  Leave suitcase in the car. After surgery it may be brought to your room.  For patients admitted to the hospital, discharge time is determined by your treatment team.               Patients discharged the day of surgery will not be allowed to drive home.    Special Instructions: Start your bowel prep as directed by Dr. Gala Romney   Please read over the following fact sheets that you were given: Anesthesia Post-op Instructions and Care and Recovery After Surgery    Esophagogastroduodenoscopy Esophagogastroduodenoscopy (EGD) is a procedure to examine the lining of the esophagus, stomach, and first part of the small intestine (duodenum). A long, flexible, lighted tube with a camera attached (endoscope) is inserted down the throat to view these organs. This procedure is done to detect problems or abnormalities, such as inflammation, bleeding, ulcers, or growths, in order to treat them. The procedure lasts about 5-20 minutes. It is usually an outpatient procedure, but it may need to be performed in emergency cases in the hospital. LET YOUR CAREGIVER KNOW ABOUT:   Allergies to food or  medicine.  All medicines you are taking, including vitamins, herbs, eyedrops, and over-the-counter medicines and creams.  Use of steroids (by mouth or creams).  Previous problems you or members of your family have had with the use of anesthetics.  Any blood disorders you have.  Previous surgeries you have had.  Other health problems you have.  Possibility of pregnancy, if this applies. RISKS AND COMPLICATIONS  Generally, EGD is a safe procedure. However, as with any procedure, complications can occur. Possible complications include:  Infection.  Bleeding.  Tearing (perforation) of the esophagus, stomach, or duodenum.  Difficulty breathing or not being able to breath.  Excessive sweating.  Spasms of the larynx.  Slowed heartbeat.  Low blood pressure. BEFORE THE PROCEDURE  Do not eat or drink anything for 6-8 hours before the procedure or as directed by your caregiver.  Ask your caregiver about changing or stopping your regular medicines.  If you wear dentures, be prepared to remove them before the procedure.  Arrange for someone to drive you home after the procedure. PROCEDURE   A vein will be accessed to give medicines and fluids. A medicine to relax you (sedative) and a pain reliever will be given through that access into the vein.  A numbing medicine (local anesthetic) may be sprayed on your throat for comfort and to stop you from gagging or coughing.  A mouth guard may be placed in your mouth  to protect your teeth and to keep you from biting on the endoscope.  You will be asked to lie on your left side.  The endoscope is inserted down your throat and into the esophagus, stomach, and duodenum.  Air is put through the endoscope to allow your caregiver to view the lining of your esophagus clearly.  The esophagus, stomach, and duodenum is then examined. During the exam, your caregiver may:  Remove tissue to be examined under a microscope (biopsy) for  inflammation, infection, or other medical problems.  Remove growths.  Remove objects (foreign bodies) that are stuck.  Treat any bleeding with medicines or other devices that stop tissues from bleeding (hot cautery, clipping devices).  Widen (dilate) or stretch narrowed areas of the esophagus and stomach.  The endoscope will then be withdrawn. AFTER THE PROCEDURE  You will be taken to a recovery area to be monitored. You will be able to go home once you are stable and alert.  Do not eat or drink anything until the local anesthetic and numbing medicines have worn off. You may choke.  It is normal to feel bloated, have pain with swallowing, or have a sore throat for a short time. This will wear off.  Your caregiver should be able to discuss his or her findings with you. It will take longer to discuss the test results if any biopsies were taken. Document Released: 01/06/2005 Document Revised: 01/20/2014 Document Reviewed: 08/08/2012 [facility] Patient Information 2015 [place], [place]. This information is not intended to replace advice given to you by your health care provider. Make sure you discuss any questions you have with your health care provider.    Colonoscopy A colonoscopy is an exam to look at the entire large intestine (colon). This exam can help find problems such as tumors, polyps, inflammation, and areas of bleeding. The exam takes about 1 hour.  LET [facility] CARE PROVIDER KNOW ABOUT:   Any allergies you have.  All medicines you are taking, including vitamins, herbs, eye drops, creams, and over-the-counter medicines.  Previous problems you or members of your family have had with the use of anesthetics.  Any blood disorders you have.  Previous surgeries you have had.  Medical conditions you have. RISKS AND COMPLICATIONS  Generally, this is a safe procedure. However, as with any procedure, complications can occur. Possible complications  include:  Bleeding.  Tearing or rupture of the colon wall.  Reaction to medicines given during the exam.  Infection (rare). BEFORE THE PROCEDURE   Ask your health care provider about changing or stopping your regular medicines.  You may be prescribed an oral bowel prep. This involves drinking a large amount of medicated liquid, starting the day before your procedure. The liquid will cause you to have multiple loose stools until your stool is almost clear or light green. This cleans out your colon in preparation for the procedure.  Do not eat or drink anything else once you have started the bowel prep, unless your health care provider tells you it is safe to do so.  Arrange for someone to drive you home after the procedure. PROCEDURE   You will be given medicine to help you relax (sedative).  You will lie on your side with your knees bent.  A long, flexible tube with a light and camera on the end (colonoscope) will be inserted through the rectum and into the colon. The camera sends video back to a computer screen as it moves through the colon. The colonoscope  also releases carbon dioxide gas to inflate the colon. This helps your health care provider see the area better.  During the exam, your health care provider may take a small tissue sample (biopsy) to be examined under a microscope if any abnormalities are found.  The exam is finished when the entire colon has been viewed. AFTER THE PROCEDURE   Do not drive for 24 hours after the exam.  You may have a small amount of blood in your stool.  You may pass moderate amounts of gas and have mild abdominal cramping or bloating. This is caused by the gas used to inflate your colon during the exam.  Ask when your test results will be ready and how you will get your results. Make sure you get your test results. Document Released: 09/02/2000 Document Revised: 06/26/2013 Document Reviewed: 05/13/2013 [facility] Patient Information 2015  [place], [place]. This information is not intended to replace advice given to you by your health care provider. Make sure you discuss any questions you have with your health care provider.    PATIENT INSTRUCTIONS POST-ANESTHESIA  IMMEDIATELY FOLLOWING SURGERY:  Do not drive or operate machinery for the first twenty four hours after surgery.  Do not make any important decisions for twenty four hours after surgery or while taking narcotic pain medications or sedatives.  If you develop intractable nausea and vomiting or a severe headache please notify your doctor immediately.  FOLLOW-UP:  Please make an appointment with your surgeon as instructed. You do not need to follow up with anesthesia unless specifically instructed to do so.  WOUND CARE INSTRUCTIONS (if applicable):  Keep a dry clean dressing on the anesthesia/puncture wound site if there is drainage.  Once the wound has quit draining you may leave it open to air.  Generally you should leave the bandage intact for twenty four hours unless there is drainage.  If the epidural site drains for more than 36-48 hours please call the anesthesia department.  QUESTIONS?:  Please feel free to call your physician or the hospital operator if you have any questions, and they will be happy to assist you.

## 2014-09-22 ENCOUNTER — Encounter (HOSPITAL_COMMUNITY)
Admission: RE | Admit: 2014-09-22 | Discharge: 2014-09-22 | Disposition: A | Discharge status: Home / Self Care | Payer: Medicaid Other | Source: Ambulatory Visit | Attending: Internal Medicine | Admitting: Internal Medicine

## 2014-09-22 ENCOUNTER — Encounter (HOSPITAL_COMMUNITY): Payer: Self-pay

## 2014-09-22 ENCOUNTER — Other Ambulatory Visit: Payer: Self-pay

## 2014-09-22 DIAGNOSIS — K921 Melena: Secondary | ICD-10-CM | POA: Diagnosis present

## 2014-09-22 DIAGNOSIS — D122 Benign neoplasm of ascending colon: Secondary | ICD-10-CM | POA: Diagnosis not present

## 2014-09-22 DIAGNOSIS — K5730 Diverticulosis of large intestine without perforation or abscess without bleeding: Secondary | ICD-10-CM | POA: Diagnosis not present

## 2014-09-22 DIAGNOSIS — F17210 Nicotine dependence, cigarettes, uncomplicated: Secondary | ICD-10-CM | POA: Diagnosis not present

## 2014-09-22 DIAGNOSIS — I10 Essential (primary) hypertension: Secondary | ICD-10-CM | POA: Diagnosis not present

## 2014-09-22 DIAGNOSIS — K529 Noninfective gastroenteritis and colitis, unspecified: Secondary | ICD-10-CM | POA: Diagnosis not present

## 2014-09-22 DIAGNOSIS — F4310 Post-traumatic stress disorder, unspecified: Secondary | ICD-10-CM | POA: Diagnosis not present

## 2014-09-22 DIAGNOSIS — J449 Chronic obstructive pulmonary disease, unspecified: Secondary | ICD-10-CM | POA: Diagnosis not present

## 2014-09-22 DIAGNOSIS — F319 Bipolar disorder, unspecified: Secondary | ICD-10-CM | POA: Diagnosis not present

## 2014-09-22 DIAGNOSIS — R1310 Dysphagia, unspecified: Secondary | ICD-10-CM | POA: Diagnosis not present

## 2014-09-22 DIAGNOSIS — K219 Gastro-esophageal reflux disease without esophagitis: Secondary | ICD-10-CM | POA: Diagnosis not present

## 2014-09-22 DIAGNOSIS — K449 Diaphragmatic hernia without obstruction or gangrene: Secondary | ICD-10-CM | POA: Diagnosis not present

## 2014-09-22 DIAGNOSIS — K6389 Other specified diseases of intestine: Secondary | ICD-10-CM | POA: Diagnosis not present

## 2014-09-22 DIAGNOSIS — J45909 Unspecified asthma, uncomplicated: Secondary | ICD-10-CM | POA: Diagnosis not present

## 2014-09-22 DIAGNOSIS — K3189 Other diseases of stomach and duodenum: Secondary | ICD-10-CM | POA: Diagnosis not present

## 2014-09-22 DIAGNOSIS — E780 Pure hypercholesterolemia: Secondary | ICD-10-CM | POA: Diagnosis not present

## 2014-09-22 DIAGNOSIS — D120 Benign neoplasm of cecum: Secondary | ICD-10-CM | POA: Diagnosis not present

## 2014-09-22 LAB — BASIC METABOLIC PANEL
ANION GAP: 6 (ref 5–15)
BUN: 13 mg/dL (ref 6–23)
CHLORIDE: 100 meq/L (ref 96–112)
CO2: 29 mmol/L (ref 19–32)
Calcium: 8.9 mg/dL (ref 8.4–10.5)
Creatinine, Ser: 0.74 mg/dL (ref 0.50–1.10)
GFR calc non Af Amer: >90 mL/min (ref >90)
Glucose, Bld: 124 mg/dL — ABNORMAL HIGH (ref 70–99)
Potassium: 3.8 mmol/L (ref 3.5–5.1)
SODIUM: 135 mmol/L (ref 135–145)

## 2014-09-22 LAB — HEMOGLOBIN AND HEMATOCRIT, BLOOD
HCT: 38.2 % (ref 36.0–46.0)
Hemoglobin: 12.7 g/dL (ref 12.0–15.0)

## 2014-09-22 LAB — HCG, SERUM, QUALITATIVE: Preg, Serum: NEGATIVE

## 2014-09-25 ENCOUNTER — Ambulatory Visit (HOSPITAL_COMMUNITY): Payer: Medicaid Other | Admitting: Anesthesiology

## 2014-09-25 ENCOUNTER — Ambulatory Visit (HOSPITAL_COMMUNITY)
Admission: RE | Admit: 2014-09-25 | Discharge: 2014-09-25 | Disposition: A | Discharge status: Home / Self Care | Payer: Medicaid Other | Source: Ambulatory Visit | Attending: Internal Medicine | Admitting: Internal Medicine

## 2014-09-25 ENCOUNTER — Encounter (HOSPITAL_COMMUNITY): Payer: Self-pay | Admitting: *Deleted

## 2014-09-25 ENCOUNTER — Encounter (HOSPITAL_COMMUNITY)
Admission: RE | Disposition: A | Discharge status: Home / Self Care | Payer: Self-pay | Source: Ambulatory Visit | Attending: Internal Medicine

## 2014-09-25 DIAGNOSIS — E780 Pure hypercholesterolemia: Secondary | ICD-10-CM | POA: Insufficient documentation

## 2014-09-25 DIAGNOSIS — K449 Diaphragmatic hernia without obstruction or gangrene: Secondary | ICD-10-CM | POA: Insufficient documentation

## 2014-09-25 DIAGNOSIS — J45909 Unspecified asthma, uncomplicated: Secondary | ICD-10-CM | POA: Insufficient documentation

## 2014-09-25 DIAGNOSIS — F319 Bipolar disorder, unspecified: Secondary | ICD-10-CM | POA: Insufficient documentation

## 2014-09-25 DIAGNOSIS — K219 Gastro-esophageal reflux disease without esophagitis: Secondary | ICD-10-CM | POA: Insufficient documentation

## 2014-09-25 DIAGNOSIS — D120 Benign neoplasm of cecum: Secondary | ICD-10-CM | POA: Diagnosis not present

## 2014-09-25 DIAGNOSIS — K6389 Other specified diseases of intestine: Secondary | ICD-10-CM | POA: Diagnosis not present

## 2014-09-25 DIAGNOSIS — F4310 Post-traumatic stress disorder, unspecified: Secondary | ICD-10-CM | POA: Insufficient documentation

## 2014-09-25 DIAGNOSIS — F17210 Nicotine dependence, cigarettes, uncomplicated: Secondary | ICD-10-CM | POA: Insufficient documentation

## 2014-09-25 DIAGNOSIS — K3189 Other diseases of stomach and duodenum: Secondary | ICD-10-CM | POA: Insufficient documentation

## 2014-09-25 DIAGNOSIS — K5730 Diverticulosis of large intestine without perforation or abscess without bleeding: Secondary | ICD-10-CM

## 2014-09-25 DIAGNOSIS — K529 Noninfective gastroenteritis and colitis, unspecified: Secondary | ICD-10-CM

## 2014-09-25 DIAGNOSIS — I10 Essential (primary) hypertension: Secondary | ICD-10-CM | POA: Insufficient documentation

## 2014-09-25 DIAGNOSIS — D122 Benign neoplasm of ascending colon: Secondary | ICD-10-CM | POA: Diagnosis not present

## 2014-09-25 DIAGNOSIS — Z86010 Personal history of colonic polyps: Secondary | ICD-10-CM

## 2014-09-25 DIAGNOSIS — R1310 Dysphagia, unspecified: Secondary | ICD-10-CM | POA: Insufficient documentation

## 2014-09-25 DIAGNOSIS — K635 Polyp of colon: Secondary | ICD-10-CM

## 2014-09-25 DIAGNOSIS — J449 Chronic obstructive pulmonary disease, unspecified: Secondary | ICD-10-CM | POA: Insufficient documentation

## 2014-09-25 DIAGNOSIS — R1013 Epigastric pain: Secondary | ICD-10-CM

## 2014-09-25 HISTORY — PX: BIOPSY: SHX5522

## 2014-09-25 HISTORY — PX: POLYPECTOMY: SHX5525

## 2014-09-25 HISTORY — PX: ESOPHAGOGASTRODUODENOSCOPY (EGD) WITH PROPOFOL: SHX5813

## 2014-09-25 HISTORY — PX: COLONOSCOPY WITH PROPOFOL: SHX5780

## 2014-09-25 SURGERY — COLONOSCOPY WITH PROPOFOL
Anesthesia: Monitor Anesthesia Care

## 2014-09-25 MED ORDER — LIDOCAINE VISCOUS 2 % MT SOLN
OROMUCOSAL | Status: AC
  Filled 2014-09-25: qty 15

## 2014-09-25 MED ORDER — WATER FOR IRRIGATION, STERILE IR SOLN
Status: DC | PRN
  Administered 2014-09-25: 1000 mL

## 2014-09-25 MED ORDER — MIDAZOLAM HCL 2 MG/2ML IJ SOLN
INTRAMUSCULAR | Status: AC
  Filled 2014-09-25: qty 2

## 2014-09-25 MED ORDER — LIDOCAINE HCL (PF) 1 % IJ SOLN
INTRAMUSCULAR | Status: AC
  Filled 2014-09-25: qty 5

## 2014-09-25 MED ORDER — ONDANSETRON HCL 4 MG/2ML IJ SOLN
4.0000 mg | Freq: Once | INTRAMUSCULAR | Status: AC
  Administered 2014-09-25: 4 mg via INTRAVENOUS

## 2014-09-25 MED ORDER — PROPOFOL 10 MG/ML IV BOLUS
INTRAVENOUS | Status: AC
  Filled 2014-09-25: qty 20

## 2014-09-25 MED ORDER — ONDANSETRON HCL 4 MG/2ML IJ SOLN
INTRAMUSCULAR | Status: AC
  Filled 2014-09-25: qty 2

## 2014-09-25 MED ORDER — MIDAZOLAM HCL 2 MG/2ML IJ SOLN
1.0000 mg | INTRAMUSCULAR | Status: DC | PRN
  Administered 2014-09-25 (×2): 2 mg via INTRAVENOUS

## 2014-09-25 MED ORDER — PROPOFOL INFUSION 10 MG/ML OPTIME
INTRAVENOUS | Status: DC | PRN
  Administered 2014-09-25: 100 ug/kg/min via INTRAVENOUS
  Administered 2014-09-25 (×2): via INTRAVENOUS

## 2014-09-25 MED ORDER — SUCCINYLCHOLINE CHLORIDE 20 MG/ML IJ SOLN
INTRAMUSCULAR | Status: AC
  Filled 2014-09-25: qty 1

## 2014-09-25 MED ORDER — ROCURONIUM BROMIDE 50 MG/5ML IV SOLN
INTRAVENOUS | Status: AC
  Filled 2014-09-25: qty 2

## 2014-09-25 MED ORDER — LIDOCAINE VISCOUS 2 % MT SOLN
3.0000 mL | Freq: Once | OROMUCOSAL | Status: AC
  Administered 2014-09-25: 3 mL via OROMUCOSAL

## 2014-09-25 MED ORDER — MIDAZOLAM HCL 5 MG/5ML IJ SOLN
INTRAMUSCULAR | Status: DC | PRN
  Administered 2014-09-25 (×2): 2 mg via INTRAVENOUS

## 2014-09-25 MED ORDER — LACTATED RINGERS IV SOLN
INTRAVENOUS | Status: DC
  Administered 2014-09-25: 11:00:00 via INTRAVENOUS

## 2014-09-25 MED ORDER — STERILE WATER FOR IRRIGATION IR SOLN
Status: DC | PRN
  Administered 2014-09-25: 1000 mL

## 2014-09-25 MED ORDER — FENTANYL CITRATE 0.05 MG/ML IJ SOLN
INTRAMUSCULAR | Status: AC
  Filled 2014-09-25: qty 2

## 2014-09-25 MED ORDER — FENTANYL CITRATE 0.05 MG/ML IJ SOLN
25.0000 ug | INTRAMUSCULAR | Status: AC
  Administered 2014-09-25 (×2): 25 ug via INTRAVENOUS

## 2014-09-25 MED ORDER — FENTANYL CITRATE 0.05 MG/ML IJ SOLN
25.0000 ug | INTRAMUSCULAR | Status: DC | PRN
  Administered 2014-09-25 (×2): 50 ug via INTRAVENOUS

## 2014-09-25 MED ORDER — ONDANSETRON HCL 4 MG/2ML IJ SOLN: 4.0000 mg | Freq: Once | INTRAMUSCULAR | Status: DC | PRN

## 2014-09-25 SURGICAL SUPPLY — 26 items
BLOCK BITE 60FR ADLT L/F BLUE (MISCELLANEOUS) ×3 IMPLANT
DEVICE CLIP HEMOSTAT 235CM (CLIP) IMPLANT
ELECT REM PT RETURN 9FT ADLT (ELECTROSURGICAL) ×3
ELECTRODE REM PT RTRN 9FT ADLT (ELECTROSURGICAL) ×1 IMPLANT
FCP BXJMBJMB 240X2.8X (CUTTING FORCEPS)
FLOOR PAD 36X40 (MISCELLANEOUS) ×3
FORCEPS BIOP RAD 4 LRG CAP 4 (CUTTING FORCEPS) ×6 IMPLANT
FORCEPS BIOP RJ4 240 W/NDL (CUTTING FORCEPS)
FORCEPS BXJMBJMB 240X2.8X (CUTTING FORCEPS) IMPLANT
FORMALIN 10 PREFIL 20ML (MISCELLANEOUS) ×6 IMPLANT
INJECTOR/SNARE I SNARE (MISCELLANEOUS) IMPLANT
KIT CLEAN ENDO COMPLIANCE (KITS) ×3 IMPLANT
LUBRICANT JELLY 4.5OZ STERILE (MISCELLANEOUS) ×3 IMPLANT
MANIFOLD NEPTUNE II (INSTRUMENTS) ×3 IMPLANT
NEEDLE SCLEROTHERAPY 25GX240 (NEEDLE) IMPLANT
PAD FLOOR 36X40 (MISCELLANEOUS) ×1 IMPLANT
PROBE APC STR FIRE (PROBE) IMPLANT
PROBE INJECTION GOLD (MISCELLANEOUS)
PROBE INJECTION GOLD 7FR (MISCELLANEOUS) IMPLANT
SNARE ROTATE MED OVAL 20MM (MISCELLANEOUS) IMPLANT
SNARE SHORT THROW 13M SML OVAL (MISCELLANEOUS) IMPLANT
SYR 50ML LL SCALE MARK (SYRINGE) ×3 IMPLANT
SYR INFLATION 60ML (SYRINGE) IMPLANT
TRAP SPECIMEN MUCOUS 40CC (MISCELLANEOUS) IMPLANT
TUBING IRRIGATION ENDOGATOR (MISCELLANEOUS) ×3 IMPLANT
WATER STERILE IRR 1000ML POUR (IV SOLUTION) ×3 IMPLANT

## 2014-09-25 NOTE — Anesthesia Postprocedure Evaluation (Signed)
  Anesthesia Post-op Note  Patient: Deborah Barnes  Procedure(s) Performed: Procedure(s) with comments: COLONOSCOPY WITH PROPOFOL (N/A) - Cecum time in 1201 time out 1219     total time 18 Minutes ESOPHAGOGASTRODUODENOSCOPY (EGD) WITH PROPOFOL (N/A) BIOPSY (N/A) - Gastric, Ascending Colon, Descending/Sigmoid Colon  POLYPECTOMY (N/A) - Cecal, Ascending Colon   Patient Location: PACU  Anesthesia Type:MAC  Level of Consciousness: awake, alert , oriented and patient cooperative  Airway and Oxygen Therapy: Patient Spontanous Breathing  Post-op Pain: none  Post-op Assessment: Post-op Vital signs reviewed, Patient's Cardiovascular Status Stable, Respiratory Function Stable, Patent Airway and No signs of Nausea or vomiting  Post-op Vital Signs: Reviewed and stable  Last Vitals:  Filed Vitals:   09/25/14 1258  BP: 120/94  Pulse: 72  Temp: 36.4 C  Resp: 20    Complications: No apparent anesthesia complications

## 2014-09-25 NOTE — Transfer of Care (Signed)
Immediate Anesthesia Transfer of Care Note  Patient: Deborah Barnes  Procedure(s) Performed: Procedure(s) with comments: COLONOSCOPY WITH PROPOFOL (N/A) - Cecum time in 1201 time out 1219     total time 18 Minutes ESOPHAGOGASTRODUODENOSCOPY (EGD) WITH PROPOFOL (N/A) BIOPSY (N/A) - Gastric, Ascending Colon, Descending/Sigmoid Colon  POLYPECTOMY (N/A) - Cecal, Ascending Colon   Patient Location: PACU  Anesthesia Type:MAC  Level of Consciousness: awake and patient cooperative  Airway & Oxygen Therapy: Patient Spontanous Breathing and Patient connected to face mask oxygen  Post-op Assessment: Report given to PACU RN, Post -op Vital signs reviewed and stable and Patient moving all extremities  Post vital signs: Reviewed and stable  Complications: No apparent anesthesia complications

## 2014-09-25 NOTE — Interval H&P Note (Signed)
History and Physical Interval Note:  09/25/2014 11:13 AM  Deborah Barnes  has presented today for surgery, with the diagnosis of epigastric pain/loose stools  The various methods of treatment have been discussed with the patient and family. After consideration of risks, benefits and other options for treatment, the patient has consented to  Procedure(s) with comments: COLONOSCOPY WITH PROPOFOL (N/A) - 1100 Pt request time  ESOPHAGOGASTRODUODENOSCOPY (EGD) WITH PROPOFOL (N/A) as a surgical intervention .  The patient's history has been reviewed, patient examined, no change in status, stable for surgery.  I have reviewed the patient's chart and labs.  Questions were answered to the patient's satisfaction.     Deborah Barnes  No change. EGD and colonoscopy per plan for epigastric pain and hematochezia. Some chronic diarrhea. Relates esophageal dysphagia to me today just prior to the procedure after sedation. Has not related the symptoms previously. No dilation planned at this time. Further recommendations to follow.

## 2014-09-25 NOTE — H&P (View-Only) (Signed)
Primary Care Physician:  Terald Sleeper, PA-C Primary Gastroenterologist:  Dr. Gala Romney   Chief Complaint  Patient presents with  . Nausea  . Abdominal Pain  . Emesis    HPI:   Deborah Barnes presents today at the request of [name], PA, secondary to chronic abdominal pain, N/V, diarrhea. Epigastric pain exacerbated by times with eating but can occur without relation to food. Associated N/V. Will have episodes of nausea unrelated to any factors. Phenergan prn. Prilosec BID somewhat controls GERD symptoms. Occasional breakthrough. Takes ibuprofen 800 mg sometimes 2-3 times a day chronically.   Majority of the time has diarrhea. If takes full dosages of oxycodone, may have constipation. Depends on nerve pain in leg how much oxycodone she takes. Diarrhea present even before cholecystectomy. Postprandial urgency. Average day will have 4-7 loose stools per day. Intermittent hematochezia with constipation. Abdominal pain present prior to cholecystectomy and has persisted since then.   Past Medical History  Diagnosis Date  . Hypertension   . Hypercholesterolemia   . GERD (gastroesophageal reflux disease)   . Back pain   . Bipolar 1 disorder   . Bulging disc   . Degenerative disc disease   . COPD (chronic obstructive pulmonary disease)   . Asthma   . Shortness of breath   . Sciatica   . PTSD (post-traumatic stress disorder)   . Depression with anxiety     Past Surgical History  Procedure Laterality Date  . Carpal tunnel release Bilateral   . Tubal ligation    . Cholecystectomy N/A 04/26/2013    Procedure: LAPAROSCOPIC CHOLECYSTECTOMY;  Surgeon: Jamesetta So, MD;  Location: [facility];  Service: General;  Laterality: N/A;  . Incisional hernia repair N/A 10/04/2013    Procedure: HERNIA REPAIR INCISIONAL WITH MESH;  Surgeon: Jamesetta So, MD;  Location: [facility];  Service: General;  Laterality: N/A;  . Insertion of mesh N/A 10/04/2013    Procedure: INSERTION OF MESH;  Surgeon:  Jamesetta So, MD;  Location: [facility];  Service: General;  Laterality: N/A;    Current Outpatient Prescriptions  Medication Sig Dispense Refill  . albuterol (PROVENTIL HFA;VENTOLIN HFA) 108 (90 BASE) MCG/ACT inhaler Inhale 2 puffs into the lungs every 6 (six) hours as needed for wheezing.    Marland Kitchen atenolol (TENORMIN) 25 MG tablet Take 25 mg by mouth daily.    . carbamazepine (TEGRETOL) 200 MG tablet Take 200 mg by mouth 2 (two) times daily. for MOOD SWINGS     . cholecalciferol (VITAMIN D) 1000 UNITS tablet Take 5,000 Units by mouth daily.    . cyclobenzaprine (FLEXERIL) 10 MG tablet Take 1 tablet (10 mg total) by mouth 3 (three) times daily as needed for muscle spasms. 30 tablet 0  . diazepam (VALIUM) 5 MG tablet Take 10 mg by mouth 3 (three) times daily.     Marland Kitchen escitalopram (LEXAPRO) 10 MG tablet Take 10 mg by mouth daily. For DEPRESSION    . hydrochlorothiazide (HYDRODIURIL) 25 MG tablet Take 25 mg by mouth daily. For FLUID    . ibuprofen (ADVIL,MOTRIN) 200 MG tablet Take 400 mg by mouth 2 (two) times daily as needed for moderate pain.    Marland Kitchen lamoTRIgine (LAMICTAL) 200 MG tablet Take 200 mg by mouth daily. For BIPOLAR    . loratadine (CLARITIN) 10 MG tablet Take 10 mg by mouth.    Marland Kitchen omeprazole (PRILOSEC) 20 MG capsule Take 20 mg by mouth 2 (two) times daily. For GERD    .  oxycodone (OXY-IR) 5 MG capsule Take 10 mg by mouth every 6 (six) hours as needed.    . promethazine (PHENERGAN) 25 MG tablet Take 25 mg by mouth every 6 (six) hours as needed for nausea or vomiting.     No current facility-administered medications for this visit.    Allergies as of 08/27/2014 - Review Complete 01/20/2014  Allergen Reaction Noted  . Clarithromycin Diarrhea 02/22/2012  . Darvocet [propoxyphene n-acetaminophen] Rash 10/12/2011    Family History  Problem Relation Age of Onset  . Diabetes Father   . Colon cancer Maternal Grandmother     History   Social History  . Marital Status: Divorced    Spouse  Name: N/A    Number of Children: N/A  . Years of Education: N/A   Occupational History  . Not on file.   Social History Main Topics  . Smoking status: Current Every Day Smoker -- 1.00 packs/day for 20 years    Types: Cigarettes  . Smokeless tobacco: Never Used  . Alcohol Use: No     Comment: ETOH abuse in past, quit a few years ago.   . Drug Use: No  . Sexual Activity: Yes    Birth Control/ Protection: Surgical   Other Topics Concern  . Not on file   Social History Narrative    Review of Systems: Gen: see HPI CV: Denies chest pain, heart palpitations, peripheral edema, syncope.  Resp: chronic bronchitis, asthma  GI: see HPI GU : Denies urinary burning, urinary frequency, urinary hesitancy MS: "joint pain"  Derm: Denies rash, itching, dry skin Psych: +depression/anxiety Heme: Denies bruising, bleeding, and enlarged lymph nodes.  Physical Exam: BP 127/85 mmHg  Pulse 85  Temp(Src) 97.1 F (36.2 C)  Ht 5\' 5"  (1.651 m)  Wt 235 lb 6.4 oz (106.777 kg)  BMI 39.17 kg/m2  LMP 08/17/2014 General:   Alert and oriented. Pleasant and cooperative. Well-nourished and well-developed.  Head:  Normocephalic and atraumatic. Eyes:  Without icterus, sclera clear and conjunctiva pink.  Ears:  Normal auditory acuity. Nose:  No deformity, discharge,  or lesions. Mouth:  No deformity or lesions, oral mucosa pink.  Lungs:  Clear to auscultation bilaterally. No wheezes, rales, or rhonchi. No distress.  Heart:  S1, S2 present without murmurs appreciated.  Abdomen:  +BS, soft, TTP epigastric region and non-distended. No HSM noted. No guarding or rebound. No masses appreciated.  Rectal:  Deferred  Msk:  Symmetrical without gross deformities. Normal posture. Extremities:  Without edema. Neurologic:  Alert and  oriented x4;  grossly normal neurologically. Skin:  Intact without significant lesions or rashes. Psych:  Alert and cooperative. Normal mood and affect.

## 2014-09-25 NOTE — Anesthesia Preprocedure Evaluation (Signed)
Anesthesia Evaluation  Patient identified by MRN, date of birth, ID band Patient awake    Reviewed: Allergy & Precautions, H&P , NPO status , Patient's Chart, lab work & pertinent test results  Airway Mallampati: II  TM Distance: >3 FB Neck ROM: Full    Dental  (+) Teeth Intact   Pulmonary shortness of breath, asthma , COPDCurrent Smoker,  breath sounds clear to auscultation        Cardiovascular hypertension, Pt. on medications Rhythm:Regular Rate:Normal     Neuro/Psych PSYCHIATRIC DISORDERS Bipolar Disorder  Neuromuscular disease    GI/Hepatic GERD-  Medicated and Controlled,  Endo/Other  Morbid obesity  Renal/GU      Musculoskeletal  (+) Arthritis -,   Abdominal   Peds  Hematology   Anesthesia Other Findings   Reproductive/Obstetrics                             Anesthesia Physical Anesthesia Plan  ASA: III  Anesthesia Plan: MAC   Post-op Pain Management:    Induction: Intravenous  Airway Management Planned: Simple Face Mask  Additional Equipment:   Intra-op Plan:   Post-operative Plan:   Informed Consent: I have reviewed the patients History and Physical, chart, labs and discussed the procedure including the risks, benefits and alternatives for the proposed anesthesia with the patient or authorized representative who has indicated his/her understanding and acceptance.     Plan Discussed with:   Anesthesia Plan Comments:         Anesthesia Quick Evaluation

## 2014-09-25 NOTE — Discharge Instructions (Signed)
Colonoscopy Discharge Instructions  Read the instructions outlined below and refer to this sheet in the next few weeks. These discharge instructions provide you with general information on caring for yourself after you leave the hospital. Your doctor may also give you specific instructions. While your treatment has been planned according to the most current medical practices available, unavoidable complications occasionally occur. If you have any problems or questions after discharge, call Dr. Gala Romney at [phone]. ACTIVITY  You may resume your regular activity, but move at a slower pace for the next 24 hours.   Take frequent rest periods for the next 24 hours.   Walking will help get rid of the air and reduce the bloated feeling in your belly (abdomen).   No driving for 24 hours (because of the medicine (anesthesia) used during the test).    Do not sign any important legal documents or operate any machinery for 24 hours (because of the anesthesia used during the test).  NUTRITION  Drink plenty of fluids.   You may resume your normal diet as instructed by your doctor.   Begin with a light meal and progress to your normal diet. Heavy or fried foods are harder to digest and may make you feel sick to your stomach (nauseated).   Avoid alcoholic beverages for 24 hours or as instructed.  MEDICATIONS  You may resume your normal medications unless your doctor tells you otherwise.  WHAT YOU CAN EXPECT TODAY  Some feelings of bloating in the abdomen.   Passage of more gas than usual.   Spotting of blood in your stool or on the toilet paper.  IF YOU HAD POLYPS REMOVED DURING THE COLONOSCOPY:  No aspirin products for 7 days or as instructed.   No alcohol for 7 days or as instructed.   Eat a soft diet for the next 24 hours.  FINDING OUT THE RESULTS OF YOUR TEST Not all test results are available during your visit. If your test results are not back during the visit, make an appointment  with your caregiver to find out the results. Do not assume everything is normal if you have not heard from your caregiver or the medical facility. It is important for you to follow up on all of your test results.  SEEK IMMEDIATE MEDICAL ATTENTION IF:  You have more than a spotting of blood in your stool.   Your belly is swollen (abdominal distention).   You are nauseated or vomiting.   You have a temperature over 101.  You have abdominal pain or discomfort that is severe or gets worse throughout the day. EGD Discharge instructions Please read the instructions outlined below and refer to this sheet in the next few weeks. These discharge instructions provide you with general information on caring for yourself after you leave the hospital. Your doctor may also give you specific instructions. While your treatment has been planned according to the most current medical practices available, unavoidable complications occasionally occur. If you have any problems or questions after discharge, please call your doctor. ACTIVITY You may resume your regular activity but move at a slower pace for the next 24 hours.  Take frequent rest periods for the next 24 hours.  Walking will help expel (get rid of) the air and reduce the bloated feeling in your abdomen.  No driving for 24 hours (because of the anesthesia (medicine) used during the test).  You may shower.  Do not sign any important legal documents or operate any machinery for 24  for 24 hours (because of the anesthesia used during the test).  °NUTRITION °Drink plenty of fluids.  °You may resume your normal diet.  °Begin with a light meal and progress to your normal diet.  °Avoid alcoholic beverages for 24 hours or as instructed by your caregiver.  °MEDICATIONS °You may resume your normal medications unless your caregiver tells you otherwise.  °WHAT YOU CAN EXPECT TODAY °You may experience abdominal discomfort such as a feeling of fullness or “gas” pains.    °FOLLOW-UP °Your doctor will discuss the results of your test with you.  °SEEK IMMEDIATE MEDICAL ATTENTION IF ANY OF THE FOLLOWING OCCUR: °Excessive nausea (feeling sick to your stomach) and/or vomiting.  °Severe abdominal pain and distention (swelling).  °Trouble swallowing.  °Temperature over 101° F (37.8º C).  °Rectal bleeding or vomiting of blood.  ° ° °Polyp and diverticulosis information provided ° °Further recommendations to follow pending review of pathology report ° °Diverticulosis °Diverticulosis is the condition that develops when small pouches (diverticula) form in the wall of your colon. Your colon, or large intestine, is where water is absorbed and stool is formed. The pouches form when the inside layer of your colon pushes through weak spots in the outer layers of your colon. °CAUSES  °No one knows exactly what causes diverticulosis. °RISK FACTORS °· Being older than 50. Your risk for this condition increases with age. Diverticulosis is rare in people younger than 40 years. By age 80, almost everyone has it. °· Eating a low-fiber diet. °· Being frequently constipated. °· Being overweight. °· Not getting enough exercise. °· Smoking. °· Taking over-the-counter pain medicines, like aspirin and ibuprofen. °SYMPTOMS  °Most people with diverticulosis do not have symptoms. °DIAGNOSIS  °Because diverticulosis often has no symptoms, health care providers often discover the condition during an exam for other colon problems. In many cases, a health care provider will diagnose diverticulosis while using a flexible scope to examine the colon (colonoscopy). °TREATMENT  °If you have never developed an infection related to diverticulosis, you may not need treatment. If you have had an infection before, treatment may include: °· Eating more fruits, vegetables, and grains. °· Taking a fiber supplement. °· Taking a live bacteria supplement (probiotic). °· Taking medicine to relax your colon. °HOME CARE INSTRUCTIONS   °· Drink at least 6-8 glasses of water each day to prevent constipation. °· Try not to strain when you have a bowel movement. °· Keep all follow-up appointments. °If you have had an infection before:  °· Increase the fiber in your diet as directed by your health care provider or dietitian. °· Take a dietary fiber supplement if your health care provider approves. °· Only take medicines as directed by your health care provider. °SEEK MEDICAL CARE IF:  °· You have abdominal pain. °· You have bloating. °· You have cramps. °· You have not gone to the bathroom in 3 days. °SEEK IMMEDIATE MEDICAL CARE IF:  °· Your pain gets worse. °· Your bloating becomes very bad. °· You have a fever or chills, and your symptoms suddenly get worse. °· You begin vomiting. °· You have bowel movements that are bloody or black. °MAKE SURE YOU: °· Understand these instructions. °· Will watch your condition. °· Will get help right away if you are not doing well or get worse. °Document Released: 06/02/2004 Document Revised: 09/10/2013 Document Reviewed: 07/31/2013 °ExitCare® Patient Information ©2015 [employer]. This information is not intended to replace advice given to you by your health care provider. Make sure you   discuss any questions you have with your health care provider. ° °Colon Polyps °Polyps are lumps of extra tissue growing inside the body. Polyps can grow in the large intestine (colon). Most colon polyps are noncancerous (benign). However, some colon polyps can become cancerous over time. Polyps that are larger than a pea may be harmful. To be safe, caregivers remove and test all polyps. °CAUSES  °Polyps form when mutations in the genes cause your cells to grow and divide even though no more tissue is needed. °RISK FACTORS °There are a number of risk factors that can increase your chances of getting colon polyps. They include: °Being older than 50 years. °Family history of colon polyps or colon cancer. °Long-term colon diseases,  such as colitis or Crohn disease. °Being overweight. °Smoking. °Being inactive. °Drinking too much alcohol. °SYMPTOMS  °Most small polyps do not cause symptoms. If symptoms are present, they may include: °Blood in the stool. The stool may look dark red or black. °Constipation or diarrhea that lasts longer than 1 week. °DIAGNOSIS °People often do not know they have polyps until their caregiver finds them during a regular checkup. Your caregiver can use 4 tests to check for polyps: °Digital rectal exam. The caregiver wears gloves and feels inside the rectum. This test would find polyps only in the rectum. °Barium enema. The caregiver puts a liquid called barium into your rectum before taking X-rays of your colon. Barium makes your colon look white. Polyps are dark, so they are easy to see in the X-ray pictures. °Sigmoidoscopy. A thin, flexible tube (sigmoidoscope) is placed into your rectum. The sigmoidoscope has a light and tiny camera in it. The caregiver uses the sigmoidoscope to look at the last third of your colon. °Colonoscopy. This test is like sigmoidoscopy, but the caregiver looks at the entire colon. This is the most common method for finding and removing polyps. °TREATMENT  °Any polyps will be removed during a sigmoidoscopy or colonoscopy. The polyps are then tested for cancer. °PREVENTION  °To help lower your risk of getting more colon polyps: °Eat plenty of fruits and vegetables. Avoid eating fatty foods. °Do not smoke. °Avoid drinking alcohol. °Exercise every day. °Lose weight if recommended by your caregiver. °Eat plenty of calcium and folate. Foods that are rich in calcium include milk, cheese, and broccoli. Foods that are rich in folate include chickpeas, kidney beans, and spinach. °HOME CARE INSTRUCTIONS °Keep all follow-up appointments as directed by your caregiver. You may need periodic exams to check for polyps. °SEEK MEDICAL CARE IF: °You notice bleeding during a bowel movement. °Document  Released: 06/01/2004 Document Revised: 11/28/2011 Document Reviewed: 11/15/2011 °ExitCare® Patient Information ©2015 [employer]. This information is not intended to replace advice given to you by your health care provider. Make sure you discuss any questions you have with your health care provider. ° °

## 2014-09-25 NOTE — Op Note (Signed)
[facility] [address] [place], [address]   ENDOSCOPY PROCEDURE REPORT  PATIENT: Deborah, Barnes  MR#: [id number] BIRTHDATE: 10-18-1975 , 38  yrs. old GENDER: female ENDOSCOPIST: R.  Garfield Cornea, MD [facility] REFERRED BY:  [name], PA-C PROCEDURE DATE:  2014/10/05 PROCEDURE:  EGD w/ biopsy INDICATIONS:  epigastric pain; NSAID use. MEDICATIONS: Deep sedation per Dr[facility]  [facility] ASA CLASS:      Class II  CONSENT: The risks, benefits, limitations, alternatives and imponderables have been discussed.  The potential for biopsy, esophogeal dilation, etc. have also been reviewed.  Questions have been answered.  All parties agreeable.  Please see the history and physical in the medical record for more information.  DESCRIPTION OF PROCEDURE: After the risks benefits and alternatives of the procedure were thoroughly explained, informed consent was obtained.  The    endoscope was introduced through the mouth and advanced to the second portion of the duodenum , limited by Without limitations. The instrument was slowly withdrawn as the mucosa was fully examined.    Normal appearing patent tubular esophagus.  Stomach empty.  Small hiatal hernia.  No ulcer or infiltrative processes.  Linear erythema and scattered erosions - antrum.  Patent pylorus.  Normal first and second portion of the duodenum.  Biopsies of abnormal gastric mucosa taken for histologic study. Retroflexed views revealed a hiatal hernia, Retroflexed views revealed  .     The scope was then withdrawn from the patient and the procedure completed.  COMPLICATIONS: There were no immediate complications.  ENDOSCOPIC IMPRESSION: Normal esophagus. Abnormal gastric mucosa status post biopsy. Hiatal hernia.  Patient complained of some dysphagia after she was sedated for her procedure today. No mention of that previously. At this point in time, her esophagus looks good endoscopically.  We'll keep this symptom in mind going forward.  RECOMMENDATIONS: Follow-up on pathology. See colonoscopy report.  REPEAT EXAM:  eSigned:  R. Garfield Cornea, MD [facility] 10-05-2014 12:45 PM    CC:  CPT CODES: ICD CODES:  The ICD and CPT codes recommended by this software are interpretations from the data that the clinical staff has captured with the software.  The verification of the translation of this report to the ICD and CPT codes and modifiers is the sole responsibility of the health care institution and practicing physician where this report was generated.  [employer] will not be held responsible for the validity of the ICD and CPT codes included on this report.  AMA assumes no liability for data contained or not contained herein. CPT is a Designer, television/film set of the [employer].

## 2014-09-25 NOTE — Op Note (Signed)
[facility] [address] [place], [address]   COLONOSCOPY PROCEDURE REPORT  PATIENT: Deborah, Barnes  MR#: [id number] BIRTHDATE: 1976/01/11 , 38  yrs. old GENDER: female ENDOSCOPIST: R.  Garfield Cornea, MD FACP [facility] REFERRED [place]:SHUOH Ronnald Ramp, PA-C PROCEDURE DATE:  2014/10/14 PROCEDURE:   Colonoscopy with biopsy, snare polypectomy and ablation INDICATIONS:   paper hematochezia; chronic diarrhea. MEDICATIONS: deep sedation per Dr.  [facility] ASA CLASS:       Class II  CONSENT: The risks, benefits, alternatives and imponderables including but not limited to bleeding, perforation as well as the possibility of a missed lesion have been reviewed.  The potential for biopsy, lesion removal, etc. have also been discussed. Questions have been answered.  All parties agreeable.  Please see the history and physical in the medical record for more information.  DESCRIPTION OF PROCEDURE:   After the risks benefits and alternatives of the procedure were thoroughly explained, informed consent was obtained.  The digital rectal exam revealed no abnormalities of the rectum.   The     endoscope was introduced through the anus and advanced to the   . No adverse events experienced.   The quality of the prep was     The instrument was then slowly withdrawn as the colon was fully examined.      COLON FINDINGS: Normal rectum.  Scattered pancolonic diverticula; (1) 5 mm polyp in the mid ascending segment and a 5 m polyp in the cecum; the remainder of the colonic mucosa appeared normal.  The distal 10 cm of terminal ileal mucosa also appeared normal.  The cecal polyp was initially cold snared.  A portion of the polyp was left behind.  It was subsequently hot snared.;  a small amount of residual polyp at the periphery was ablated with the tip of a hot snare loop  .  The ascending colon polyp was cold snare removed. segmental biopsies of the ascending and descending/sigmoid  segments taken to evaluate for microscopic colitis.  The patient tolerated the procedure well.  Retroflexion was performed. .  Withdrawal time=17 minutes 0 seconds.  The scope was withdrawn and the procedure completed. COMPLICATIONS: There were no immediate complications.  ENDOSCOPIC IMPRESSION: Pancolonic diverticulosis. Multiple colonic polyps removed as described above.  status post segmental biopsy  RECOMMENDATIONS: Follow-up pathology. See EGD report.  eSigned:  R. Garfield Cornea, MD [facility] October 14, 2014 1:19 PM   cc:  CPT CODES: ICD CODES:  The ICD and CPT codes recommended by this software are interpretations from the data that the clinical staff has captured with the software.  The verification of the translation of this report to the ICD and CPT codes and modifiers is the sole responsibility of the health care institution and practicing physician where this report was generated.  [employer] will not be held responsible for the validity of the ICD and CPT codes included on this report.  AMA assumes no liability for data contained or not contained herein. CPT is a Designer, television/film set of the [employer].

## 2014-09-26 ENCOUNTER — Encounter (HOSPITAL_COMMUNITY): Payer: Self-pay | Admitting: Internal Medicine

## 2014-09-30 ENCOUNTER — Encounter: Payer: Self-pay | Admitting: Internal Medicine

## 2014-09-30 ENCOUNTER — Telehealth: Payer: Self-pay

## 2014-09-30 NOTE — Telephone Encounter (Signed)
APPOINTMENT MADE AND LETTER SENT °

## 2014-09-30 NOTE — Telephone Encounter (Signed)
Per RMR- Send letter to patient.  Send copy of letter with path to referring provider and PCP.   Need ov w extender in coming weeks in ref to abd pain and diarrhea

## 2014-09-30 NOTE — Telephone Encounter (Signed)
Letter mailed to the pt. 

## 2014-10-21 ENCOUNTER — Encounter (HOSPITAL_COMMUNITY): Payer: Self-pay | Admitting: Emergency Medicine

## 2014-10-21 ENCOUNTER — Emergency Department (HOSPITAL_COMMUNITY)
Admission: EM | Admit: 2014-10-21 | Discharge: 2014-10-21 | Disposition: A | Discharge status: Home / Self Care | Payer: Medicaid Other | Attending: Emergency Medicine | Admitting: Emergency Medicine

## 2014-10-21 ENCOUNTER — Emergency Department (HOSPITAL_COMMUNITY): Payer: Medicaid Other

## 2014-10-21 DIAGNOSIS — J449 Chronic obstructive pulmonary disease, unspecified: Secondary | ICD-10-CM | POA: Insufficient documentation

## 2014-10-21 DIAGNOSIS — Y92008 Other place in unspecified non-institutional (private) residence as the place of occurrence of the external cause: Secondary | ICD-10-CM | POA: Diagnosis not present

## 2014-10-21 DIAGNOSIS — W01198A Fall on same level from slipping, tripping and stumbling with subsequent striking against other object, initial encounter: Secondary | ICD-10-CM | POA: Diagnosis not present

## 2014-10-21 DIAGNOSIS — S0083XA Contusion of other part of head, initial encounter: Secondary | ICD-10-CM | POA: Diagnosis not present

## 2014-10-21 DIAGNOSIS — K219 Gastro-esophageal reflux disease without esophagitis: Secondary | ICD-10-CM | POA: Insufficient documentation

## 2014-10-21 DIAGNOSIS — Y998 Other external cause status: Secondary | ICD-10-CM | POA: Insufficient documentation

## 2014-10-21 DIAGNOSIS — W19XXXA Unspecified fall, initial encounter: Secondary | ICD-10-CM

## 2014-10-21 DIAGNOSIS — Z8639 Personal history of other endocrine, nutritional and metabolic disease: Secondary | ICD-10-CM | POA: Diagnosis not present

## 2014-10-21 DIAGNOSIS — Z720 Tobacco use: Secondary | ICD-10-CM | POA: Diagnosis not present

## 2014-10-21 DIAGNOSIS — I10 Essential (primary) hypertension: Secondary | ICD-10-CM | POA: Diagnosis not present

## 2014-10-21 DIAGNOSIS — Z7951 Long term (current) use of inhaled steroids: Secondary | ICD-10-CM | POA: Diagnosis not present

## 2014-10-21 DIAGNOSIS — F418 Other specified anxiety disorders: Secondary | ICD-10-CM | POA: Diagnosis not present

## 2014-10-21 DIAGNOSIS — F4310 Post-traumatic stress disorder, unspecified: Secondary | ICD-10-CM | POA: Diagnosis not present

## 2014-10-21 DIAGNOSIS — S161XXA Strain of muscle, fascia and tendon at neck level, initial encounter: Secondary | ICD-10-CM | POA: Diagnosis not present

## 2014-10-21 DIAGNOSIS — Y9301 Activity, walking, marching and hiking: Secondary | ICD-10-CM | POA: Insufficient documentation

## 2014-10-21 DIAGNOSIS — F319 Bipolar disorder, unspecified: Secondary | ICD-10-CM | POA: Insufficient documentation

## 2014-10-21 DIAGNOSIS — Z79899 Other long term (current) drug therapy: Secondary | ICD-10-CM | POA: Diagnosis not present

## 2014-10-21 DIAGNOSIS — R52 Pain, unspecified: Secondary | ICD-10-CM

## 2014-10-21 DIAGNOSIS — S0093XA Contusion of unspecified part of head, initial encounter: Secondary | ICD-10-CM

## 2014-10-21 DIAGNOSIS — S0990XA Unspecified injury of head, initial encounter: Secondary | ICD-10-CM | POA: Diagnosis present

## 2014-10-21 MED ORDER — TRAMADOL HCL 50 MG PO TABS: 50.0000 mg | ORAL_TABLET | Freq: Four times a day (QID) | ORAL | Status: DC | PRN

## 2014-10-21 MED ORDER — ONDANSETRON 8 MG PO TBDP
8.0000 mg | ORAL_TABLET | Freq: Once | ORAL | Status: AC
  Administered 2014-10-21: 8 mg via ORAL
  Filled 2014-10-21: qty 1

## 2014-10-21 MED ORDER — OXYCODONE-ACETAMINOPHEN 5-325 MG PO TABS
1.0000 | ORAL_TABLET | Freq: Once | ORAL | Status: AC
  Administered 2014-10-21: 1 via ORAL
  Filled 2014-10-21: qty 1

## 2014-10-21 NOTE — ED Provider Notes (Signed)
CSN: [id number]     Arrival date & time 10/21/14  1932 History   First MD Initiated Contact with Patient 10/21/14 2005     Chief Complaint  Patient presents with  . Fall     (Consider location/radiation/quality/duration/timing/severity/associated sxs/prior Treatment) Patient is a 39 y.o. female presenting with fall. The history is provided by the patient.  Fall Associated symptoms include headaches. Pertinent negatives include no chest pain, no abdominal pain and no shortness of breath.  pt c/o mechanical fall at home this afternoon/evening. Pt indicates she was walking w her cane when cane got caught between furniture, causing her to trip and fall forward, hit her head on dresser. ?brief loc. Pt states she was able to get self up to chair, but doesn't remember how. Since fall, c/o headache and nausea. Moderate, constant, persistent. Also c/o neck pain post fall. Moderate, constant. No radicular pain. No numbness/weakness. States chronic back pain, no change post fall. No leg pain or numbness/weakness. Denies cp or sob. No abd pain. Pt denies any dizziness or faintness prior to fall. States immediately prior to fall was in normal/baseline state of health.      Past Medical History  Diagnosis Date  . Hypertension   . Hypercholesterolemia   . GERD (gastroesophageal reflux disease)   . Back pain   . Bipolar 1 disorder   . Bulging disc   . Degenerative disc disease   . COPD (chronic obstructive pulmonary disease)   . Asthma   . Shortness of breath   . Sciatica   . PTSD (post-traumatic stress disorder)   . Depression with anxiety    Past Surgical History  Procedure Laterality Date  . Carpal tunnel release Bilateral   . Tubal ligation    . Cholecystectomy N/A 04/26/2013    Procedure: LAPAROSCOPIC CHOLECYSTECTOMY;  Surgeon: Jamesetta So, MD;  Location: [facility];  Service: General;  Laterality: N/A;  . Incisional hernia repair N/A 10/04/2013    Procedure: HERNIA REPAIR INCISIONAL WITH  MESH;  Surgeon: Jamesetta So, MD;  Location: [facility];  Service: General;  Laterality: N/A;  . Insertion of mesh N/A 10/04/2013    Procedure: INSERTION OF MESH;  Surgeon: Jamesetta So, MD;  Location: [facility];  Service: General;  Laterality: N/A;  . Colonoscopy with propofol N/A 09/25/2014    Procedure: COLONOSCOPY WITH PROPOFOL;  Surgeon: Daneil Dolin, MD;  Location: [facility];  Service: Endoscopy;  Laterality: N/A;  Cecum time in 1201 time out 1219     total time 18 Minutes  . Esophagogastroduodenoscopy (egd) with propofol N/A 09/25/2014    Procedure: ESOPHAGOGASTRODUODENOSCOPY (EGD) WITH PROPOFOL;  Surgeon: Daneil Dolin, MD;  Location: [facility];  Service: Endoscopy;  Laterality: N/A;  . Esophageal biopsy N/A 09/25/2014    Procedure: BIOPSY;  Surgeon: Daneil Dolin, MD;  Location: [facility];  Service: Endoscopy;  Laterality: N/A;  Gastric, Ascending Colon, Descending/Sigmoid Colon   . Polypectomy N/A 09/25/2014    Procedure: POLYPECTOMY;  Surgeon: Daneil Dolin, MD;  Location: [facility];  Service: Endoscopy;  Laterality: N/A;  Cecal, Ascending Colon    Family History  Problem Relation Age of Onset  . Diabetes Father   . Colon cancer Maternal Grandmother    History  Substance Use Topics  . Smoking status: Current Every Day Smoker -- 1.00 packs/day for 20 years    Types: Cigarettes  . Smokeless tobacco: Never Used  . Alcohol Use: No     Comment: ETOH abuse in  past, quit a few years ago.    OB History    Gravida Para Term Preterm AB TAB SAB Ectopic Multiple Living   '11 2 2  9  9   2     ' Review of Systems  Constitutional: Negative for fever and chills.  HENT: Negative for nosebleeds.   Eyes: Negative for pain and visual disturbance.  Respiratory: Negative for shortness of breath.   Cardiovascular: Negative for chest pain.  Gastrointestinal: Positive for nausea. Negative for vomiting, abdominal pain and diarrhea.  Genitourinary: Negative for dysuria and flank pain.  Musculoskeletal: Positive for  neck pain.  Skin: Negative for wound.  Neurological: Positive for headaches. Negative for weakness and numbness.  Hematological: Does not bruise/bleed easily.  Psychiatric/Behavioral: Negative for confusion.      Allergies  Clarithromycin and Darvocet  Home Medications   Prior to Admission medications   Medication Sig Start Date End Date Taking? Authorizing Provider  albuterol (PROVENTIL HFA;VENTOLIN HFA) 108 (90 BASE) MCG/ACT inhaler Inhale 2 puffs into the lungs 4 (four) times daily.     Historical Provider, MD  atenolol (TENORMIN) 25 MG tablet Take 25 mg by mouth daily.    Historical Provider, MD  beclomethasone (QVAR) 80 MCG/ACT inhaler Inhale 2 puffs into the lungs 2 (two) times daily.    Historical Provider, MD  carbamazepine (TEGRETOL) 200 MG tablet Take 200 mg by mouth 2 (two) times daily. for MOOD SWINGS     Historical Provider, MD  cholecalciferol (VITAMIN D) 1000 UNITS tablet Take 1,000 Units by mouth daily.     Historical Provider, MD  cyclobenzaprine (FLEXERIL) 10 MG tablet Take 1 tablet (10 mg total) by mouth 3 (three) times daily as needed for muscle spasms. 01/20/14   Janice Norrie, MD  diazepam (VALIUM) 10 MG tablet Take 10 mg by mouth 3 (three) times daily.    Historical Provider, MD  dicyclomine (BENTYL) 10 MG capsule Take 1 capsule (10 mg total) by mouth 4 (four) times daily -  before meals and at bedtime. For loose stools 08/27/14   Orvil Feil, NP  escitalopram (LEXAPRO) 10 MG tablet Take 10 mg by mouth daily. For DEPRESSION    Historical Provider, MD  hydrochlorothiazide (HYDRODIURIL) 25 MG tablet Take 25 mg by mouth daily. For FLUID    Historical Provider, MD  lamoTRIgine (LAMICTAL) 200 MG tablet Take 200 mg by mouth daily. For [place] Provider, MD  loratadine (CLARITIN) 10 MG tablet Take 10 mg by mouth.    Historical Provider, MD  Oxycodone HCl 10 MG TABS Take 10-20 mg by mouth every 6 (six) hours as needed (pain).    Historical Provider, MD   pantoprazole (PROTONIX) 40 MG tablet Take 1 tablet (40 mg total) by mouth daily. 30 minutes before breakfast 08/27/14   Orvil Feil, NP  peg 3350 powder (MOVIPREP) 100 G SOLR Take 1 kit (200 g total) by mouth as directed. 08/27/14   Daneil Dolin, MD  promethazine (PHENERGAN) 25 MG tablet Take 25 mg by mouth every 6 (six) hours as needed for nausea or vomiting.    Historical Provider, MD   BP 121/83 mmHg  Pulse 73  Temp(Src) 99 F (37.2 C) (Oral)  Resp 18  Ht '5\' 5"'  (1.651 m)  Wt 231 lb (104.781 kg)  BMI 38.44 kg/m2  SpO2 93%  LMP 09/22/2014 Physical Exam  Constitutional: She is oriented to person, place, and time. She appears well-developed and well-nourished. No distress.  HENT:  Head: Atraumatic.  Nose: Nose normal.  Mouth/Throat: Oropharynx is clear and moist.  Eyes: Conjunctivae and EOM are normal. Pupils are equal, round, and reactive to light. No scleral icterus.  Neck: Neck supple. No tracheal deviation present.  Mid cervical tenderness. No sts.   Cardiovascular: Normal rate, regular rhythm, normal heart sounds and intact distal pulses.   Pulmonary/Chest: Effort normal and breath sounds normal. No respiratory distress.  Abdominal: Soft. Normal appearance and bowel sounds are normal. She exhibits no distension. There is no tenderness.  Genitourinary:  No cva tenderness  Musculoskeletal: She exhibits no edema.  Mid cervical tenderness, otherwise, CTLS spine, non tender, aligned, no step off. Good rom bil extremities without pain or focal bony tenderness.   Neurological: She is alert and oriented to person, place, and time.  Motor intact bil. stre 5/5, sens grossly intact.   Skin: Skin is warm and dry. No rash noted. She is not diaphoretic.  Psychiatric: She has a normal mood and affect.  Nursing note and vitals reviewed.   ED Course  Procedures (including critical care time)   Ct Head Wo Contrast  10/21/2014   CLINICAL DATA:  Tripped and fell, head trauma  EXAM: CT  HEAD WITHOUT CONTRAST  CT CERVICAL SPINE WITHOUT CONTRAST  TECHNIQUE: Multidetector CT imaging of the head and cervical spine was performed following the standard protocol without intravenous contrast. Multiplanar CT image reconstructions of the cervical spine were also generated.  COMPARISON:  12/27/2010 head CT  FINDINGS: CT HEAD FINDINGS  Left temporal choroidal fissure cyst reidentified. No acute hemorrhage, infarct, or mass lesion is identified. No midline shift. Ventricles are normal in size. Orbits are unremarkable. No skull fracture. Minimal ethmoid mucoperiosteal thickening is present.  CT CERVICAL SPINE FINDINGS  Reversal of the normal cervical lordosis may be positional or related to immobilization. Alignment is otherwise normal. C1 through the cervicothoracic junction is visualized in its entirety. No fracture or dislocation is identified. No precervical soft tissue widening. Minimal anterior spurring at C4-C6.  IMPRESSION: No acute intracranial abnormality.  No cervical spine fracture or dislocation.   Electronically Signed   By: Conchita Paris M.D.   On: 10/21/2014 21:05   Ct Cervical Spine Wo Contrast  10/21/2014   CLINICAL DATA:  Tripped and fell, head trauma  EXAM: CT HEAD WITHOUT CONTRAST  CT CERVICAL SPINE WITHOUT CONTRAST  TECHNIQUE: Multidetector CT imaging of the head and cervical spine was performed following the standard protocol without intravenous contrast. Multiplanar CT image reconstructions of the cervical spine were also generated.  COMPARISON:  12/27/2010 head CT  FINDINGS: CT HEAD FINDINGS  Left temporal choroidal fissure cyst reidentified. No acute hemorrhage, infarct, or mass lesion is identified. No midline shift. Ventricles are normal in size. Orbits are unremarkable. No skull fracture. Minimal ethmoid mucoperiosteal thickening is present.  CT CERVICAL SPINE FINDINGS  Reversal of the normal cervical lordosis may be positional or related to immobilization. Alignment is otherwise  normal. C1 through the cervicothoracic junction is visualized in its entirety. No fracture or dislocation is identified. No precervical soft tissue widening. Minimal anterior spurring at C4-C6.  IMPRESSION: No acute intracranial abnormality.  No cervical spine fracture or dislocation.   Electronically Signed   By: Conchita Paris M.D.   On: 10/21/2014 21:05      MDM   Ct.  Pt requests pain med. States did not take anything pta, and has ride, does not have to drive.  Percocet 1 po. zofran po.  Reviewed nursing notes and  prior charts for additional history.   Recheck pt appears comfortable. Spine nt.  Pt currently appears stable for d/c.     Mirna Mires, MD 10/21/14 2112

## 2014-10-21 NOTE — Discharge Instructions (Signed)
It was our pleasure to provide your ER care today - we hope that you feel better.  Rest.   Take motrin or aleve as need for pain.  You may also take ultram as need for pain - no driving when taking.  Follow up with primary care doctor in coming week for recheck.  Return to ER if worse, new symptoms, severe pain, weak/fainting, fevers, other concern.  You were given pain medication in the ER - no driving for the next 4 hours.          Head Injury You have received a head injury. It does not appear serious at this time. Headaches and vomiting are common following head injury. It should be easy to awaken from sleeping. Sometimes it is necessary for you to stay in the emergency department for a while for observation. Sometimes admission to the hospital may be needed. After injuries such as yours, most problems occur within the first 24 hours, but side effects may occur up to 7-10 days after the injury. It is important for you to carefully monitor your condition and contact your health care provider or seek immediate medical care if there is a change in your condition. WHAT ARE THE TYPES OF HEAD INJURIES? Head injuries can be as minor as a bump. Some head injuries can be more severe. More severe head injuries include:  A jarring injury to the brain (concussion).  A bruise of the brain (contusion). This mean there is bleeding in the brain that can cause swelling.  A cracked skull (skull fracture).  Bleeding in the brain that collects, clots, and forms a bump (hematoma). WHAT CAUSES A HEAD INJURY? A serious head injury is most likely to happen to someone who is in a car wreck and is not wearing a seat belt. Other causes of major head injuries include bicycle or motorcycle accidents, sports injuries, and falls. HOW ARE HEAD INJURIES DIAGNOSED? A complete history of the event leading to the injury and your current symptoms will be helpful in diagnosing head injuries. Many times, pictures of  the brain, such as CT or MRI are needed to see the extent of the injury. Often, an overnight hospital stay is necessary for observation.  WHEN SHOULD I SEEK IMMEDIATE MEDICAL CARE?  You should get help right away if:  You have confusion or drowsiness.  You feel sick to your stomach (nauseous) or have continued, forceful vomiting.  You have dizziness or unsteadiness that is getting worse.  You have severe, continued headaches not relieved by medicine. Only take over-the-counter or prescription medicines for pain, fever, or discomfort as directed by your health care provider.  You do not have normal function of the arms or legs or are unable to walk.  You notice changes in the black spots in the center of the colored part of your eye (pupil).  You have a clear or bloody fluid coming from your nose or ears.  You have a loss of vision. During the next 24 hours after the injury, you must stay with someone who can watch you for the warning signs. This person should contact local emergency services (911 in the [place] if you have seizures, you become unconscious, or you are unable to wake up. HOW CAN I PREVENT A HEAD INJURY IN THE FUTURE? The most important factor for preventing major head injuries is avoiding motor vehicle accidents. To minimize the potential for damage to your head, it is crucial to wear seat belts while riding in  motor vehicles. Wearing helmets while bike riding and playing collision sports (like football) is also helpful. Also, avoiding dangerous activities around the house will further help reduce your risk of head injury.  WHEN CAN I RETURN TO NORMAL ACTIVITIES AND ATHLETICS? You should be reevaluated by your health care provider before returning to these activities. If you have any of the following symptoms, you should not return to activities or contact sports until 1 week after the symptoms have stopped:  Persistent headache.  Dizziness or vertigo.  Poor attention and  concentration.  Confusion.  Memory problems.  Nausea or vomiting.  Fatigue or tire easily.  Irritability.  Intolerant of bright lights or loud noises.  Anxiety or depression.  Disturbed sleep. MAKE SURE YOU:   Understand these instructions.  Will watch your condition.  Will get help right away if you are not doing well or get worse. Document Released: 09/05/2005 Document Revised: 09/10/2013 Document Reviewed: 05/13/2013 [facility] Patient Information 2015 [place], [place]. This information is not intended to replace advice given to you by your health care provider. Make sure you discuss any questions you have with your health care provider.    Cervical Sprain A cervical sprain is an injury in the neck in which the strong, fibrous tissues (ligaments) that connect your neck bones stretch or tear. Cervical sprains can range from mild to severe. Severe cervical sprains can cause the neck vertebrae to be unstable. This can lead to damage of the spinal cord and can result in serious nervous system problems. The amount of time it takes for a cervical sprain to get better depends on the cause and extent of the injury. Most cervical sprains heal in 1 to 3 weeks. CAUSES  Severe cervical sprains may be caused by:   Contact sport injuries (such as from football, rugby, wrestling, hockey, auto racing, gymnastics, diving, martial arts, or boxing).   Motor vehicle collisions.   Whiplash injuries. This is an injury from a sudden forward and backward whipping movement of the head and neck.  Falls.  Mild cervical sprains may be caused by:   Being in an awkward position, such as while cradling a telephone between your ear and shoulder.   Sitting in a chair that does not offer proper support.   Working at a poorly Landscape [employer] station.   Looking up or down for long periods of time.  SYMPTOMS   Pain, soreness, stiffness, or a burning sensation in the front, back, or sides of  the neck. This discomfort may develop immediately after the injury or slowly, 24 hours or more after the injury.   Pain or tenderness directly in the middle of the back of the neck.   Shoulder or upper back pain.   Limited ability to move the neck.   Headache.   Dizziness.   Weakness, numbness, or tingling in the hands or arms.   Muscle spasms.   Difficulty swallowing or chewing.   Tenderness and swelling of the neck.  DIAGNOSIS  Most of the time your health care provider can diagnose a cervical sprain by taking your history and doing a physical exam. Your health care provider will ask about previous neck injuries and any known neck problems, such as arthritis in the neck. X-rays may be taken to find out if there are any other problems, such as with the bones of the neck. Other tests, such as a CT scan or MRI, may also be needed.  TREATMENT  Treatment depends on the severity of  the cervical sprain. Mild sprains can be treated with rest, keeping the neck in place (immobilization), and pain medicines. Severe cervical sprains are immediately immobilized. Further treatment is done to help with pain, muscle spasms, and other symptoms and may include:  Medicines, such as pain relievers, numbing medicines, or muscle relaxants.   Physical therapy. This may involve stretching exercises, strengthening exercises, and posture training. Exercises and improved posture can help stabilize the neck, strengthen muscles, and help stop symptoms from returning.  HOME CARE INSTRUCTIONS   Put ice on the injured area.   Put ice in a plastic bag.   Place a towel between your skin and the bag.   Leave the ice on for 15-20 minutes, 3-4 times a day.   If your injury was severe, you may have been given a cervical collar to wear. A cervical collar is a two-piece collar designed to keep your neck from moving while it heals.  Do not remove the collar unless instructed by your health care  provider.  If you have long hair, keep it outside of the collar.  Ask your health care provider before making any adjustments to your collar. Minor adjustments may be required over time to improve comfort and reduce pressure on your chin or on the back of your head.  Ifyou are allowed to remove the collar for cleaning or bathing, follow your health care provider's instructions on how to do so safely.  Keep your collar clean by wiping it with mild soap and water and drying it completely. If the collar you have been given includes removable pads, remove them every 1-2 days and hand wash them with soap and water. Allow them to air dry. They should be completely dry before you wear them in the collar.  If you are allowed to remove the collar for cleaning and bathing, wash and dry the skin of your neck. Check your skin for irritation or sores. If you see any, tell your health care provider.  Do not drive while wearing the collar.   Only take over-the-counter or prescription medicines for pain, discomfort, or fever as directed by your health care provider.   Keep all follow-up appointments as directed by your health care provider.   Keep all physical therapy appointments as directed by your health care provider.   Make any needed adjustments to your workstation to promote good posture.   Avoid positions and activities that make your symptoms worse.   Warm up and stretch before being active to help prevent problems.  SEEK MEDICAL CARE IF:   Your pain is not controlled with medicine.   You are unable to decrease your pain medicine over time as planned.   Your activity level is not improving as expected.  SEEK IMMEDIATE MEDICAL CARE IF:   You develop any bleeding.  You develop stomach upset.  You have signs of an allergic reaction to your medicine.   Your symptoms get worse.   You develop new, unexplained symptoms.   You have numbness, tingling, weakness, or paralysis  in any part of your body.  MAKE SURE YOU:   Understand these instructions.  Will watch your condition.  Will get help right away if you are not doing well or get worse. Document Released: 07/03/2007 Document Revised: 09/10/2013 Document Reviewed: 03/13/2013 [facility] Patient Information 2015 [place], [place]. This information is not intended to replace advice given to you by your health care provider. Make sure you discuss any questions you have with your health care  provider.

## 2014-10-21 NOTE — ED Notes (Signed)
Pt given discharge instructions, no questions verbalized. Pt left ED, A&O, steady gait.

## 2014-10-21 NOTE — ED Notes (Signed)
Pt was walking with her cane when she tripped and fell and hit head on dresser. Doesn't remember loss of consciousness but states "next thing she knew it was dark outside". Pt c/o sharp pain "shooting through temples" and feels nauseated and is dizzy.

## 2014-10-21 NOTE — ED Notes (Signed)
Pt crying because "the doctor only gave me one Oxycodone".

## 2014-10-30 ENCOUNTER — Ambulatory Visit: Payer: Medicaid Other | Admitting: Gastroenterology

## 2014-11-05 ENCOUNTER — Ambulatory Visit: Payer: Medicaid Other | Admitting: Gastroenterology

## 2014-11-13 ENCOUNTER — Telehealth: Payer: Self-pay | Admitting: Gastroenterology

## 2014-11-13 ENCOUNTER — Ambulatory Visit: Payer: Medicaid Other | Admitting: Gastroenterology

## 2014-11-13 ENCOUNTER — Encounter: Payer: Self-pay | Admitting: Gastroenterology

## 2014-11-13 NOTE — Telephone Encounter (Signed)
PATIENT WAS A NO SHOW 11/13/14 AND LETTER WAS SENT

## 2015-02-05 ENCOUNTER — Other Ambulatory Visit: Payer: Self-pay

## 2015-02-06 MED ORDER — PANTOPRAZOLE SODIUM 40 MG PO TBEC: 40.0000 mg | DELAYED_RELEASE_TABLET | Freq: Every day | ORAL | Status: DC

## 2015-07-24 ENCOUNTER — Ambulatory Visit: Payer: Medicaid Other | Admitting: Gastroenterology

## 2015-12-17 ENCOUNTER — Emergency Department (HOSPITAL_COMMUNITY): Payer: Medicaid Other

## 2015-12-17 ENCOUNTER — Emergency Department (HOSPITAL_COMMUNITY)
Admission: EM | Admit: 2015-12-17 | Discharge: 2015-12-17 | Disposition: A | Discharge status: Home / Self Care | Payer: Medicaid Other | Attending: Emergency Medicine | Admitting: Emergency Medicine

## 2015-12-17 ENCOUNTER — Encounter (HOSPITAL_COMMUNITY): Payer: Self-pay

## 2015-12-17 DIAGNOSIS — J45909 Unspecified asthma, uncomplicated: Secondary | ICD-10-CM | POA: Insufficient documentation

## 2015-12-17 DIAGNOSIS — R079 Chest pain, unspecified: Secondary | ICD-10-CM | POA: Diagnosis not present

## 2015-12-17 DIAGNOSIS — I10 Essential (primary) hypertension: Secondary | ICD-10-CM | POA: Diagnosis not present

## 2015-12-17 DIAGNOSIS — F319 Bipolar disorder, unspecified: Secondary | ICD-10-CM | POA: Diagnosis not present

## 2015-12-17 DIAGNOSIS — Z79899 Other long term (current) drug therapy: Secondary | ICD-10-CM | POA: Diagnosis not present

## 2015-12-17 DIAGNOSIS — R55 Syncope and collapse: Secondary | ICD-10-CM | POA: Diagnosis present

## 2015-12-17 DIAGNOSIS — J449 Chronic obstructive pulmonary disease, unspecified: Secondary | ICD-10-CM | POA: Insufficient documentation

## 2015-12-17 DIAGNOSIS — F17210 Nicotine dependence, cigarettes, uncomplicated: Secondary | ICD-10-CM | POA: Diagnosis not present

## 2015-12-17 LAB — CBC WITH DIFFERENTIAL/PLATELET
BASOS PCT: 0 %
Basophils Absolute: 0 10*3/uL (ref 0.0–0.1)
Eosinophils Absolute: 0.2 10*3/uL (ref 0.0–0.7)
Eosinophils Relative: 2 %
HCT: 40.1 % (ref 36.0–46.0)
Hemoglobin: 13.4 g/dL (ref 12.0–15.0)
Lymphocytes Relative: 26 %
Lymphs Abs: 2.9 10*3/uL (ref 0.7–4.0)
MCH: 30.2 pg (ref 26.0–34.0)
MCHC: 33.4 g/dL (ref 30.0–36.0)
MCV: 90.5 fL (ref 78.0–100.0)
MONOS PCT: 6 %
Monocytes Absolute: 0.7 10*3/uL (ref 0.1–1.0)
NEUTROS PCT: 66 %
Neutro Abs: 7.5 10*3/uL (ref 1.7–7.7)
Platelets: 373 10*3/uL (ref 150–400)
RBC: 4.43 MIL/uL (ref 3.87–5.11)
RDW: 14.4 % (ref 11.5–15.5)
WBC: 11.3 10*3/uL — ABNORMAL HIGH (ref 4.0–10.5)

## 2015-12-17 LAB — COMPREHENSIVE METABOLIC PANEL
ALT: 12 U/L — AB (ref 14–54)
AST: 12 U/L — AB (ref 15–41)
Albumin: 3.9 g/dL (ref 3.5–5.0)
Alkaline Phosphatase: 64 U/L (ref 38–126)
Anion gap: 11 (ref 5–15)
BUN: 5 mg/dL — ABNORMAL LOW (ref 6–20)
CO2: 26 mmol/L (ref 22–32)
CREATININE: 0.64 mg/dL (ref 0.44–1.00)
Calcium: 8.6 mg/dL — ABNORMAL LOW (ref 8.9–10.3)
Chloride: 104 mmol/L (ref 101–111)
GFR calc Af Amer: >60 mL/min (ref >60)
Glucose, Bld: 82 mg/dL (ref 65–99)
Potassium: 3.3 mmol/L — ABNORMAL LOW (ref 3.5–5.1)
Sodium: 141 mmol/L (ref 135–145)
Total Bilirubin: 0.5 mg/dL (ref 0.3–1.2)
Total Protein: 6.9 g/dL (ref 6.5–8.1)

## 2015-12-17 LAB — I-STAT TROPONIN, ED: TROPONIN I, POC: 0.01 ng/mL (ref 0.00–0.08)

## 2015-12-17 MED ORDER — HYDROMORPHONE HCL 1 MG/ML IJ SOLN
1.0000 mg | Freq: Once | INTRAMUSCULAR | Status: AC
  Administered 2015-12-17: 1 mg via INTRAVENOUS
  Filled 2015-12-17: qty 1

## 2015-12-17 MED ORDER — SODIUM CHLORIDE 0.9 % IV BOLUS (SEPSIS)
1000.0000 mL | Freq: Once | INTRAVENOUS | Status: AC
  Administered 2015-12-17: 1000 mL via INTRAVENOUS

## 2015-12-17 MED ORDER — ONDANSETRON HCL 4 MG/2ML IJ SOLN
4.0000 mg | Freq: Once | INTRAMUSCULAR | Status: AC
  Administered 2015-12-17: 4 mg via INTRAVENOUS
  Filled 2015-12-17: qty 2

## 2015-12-17 MED ORDER — SODIUM CHLORIDE 0.9 % IV BOLUS (SEPSIS)
1000.0000 mL | Freq: Once | INTRAVENOUS | Status: AC
Start: 2015-12-17 — End: 2015-12-17
  Administered 2015-12-17: 1000 mL via INTRAVENOUS

## 2015-12-17 NOTE — ED Notes (Signed)
Pt reports was at home having a panic attack.  Pt says chest started hurting worse than usual and she got more panicked and passed out.  Reports has chronic back pain that got worse after the fall.

## 2015-12-17 NOTE — ED Provider Notes (Signed)
CSN: [id number]     Arrival date & time 12/17/15  1508 History   First MD Initiated Contact with Patient 12/17/15 1542     Chief Complaint  Patient presents with  . Chest Pain  . Loss of Consciousness     (Consider location/radiation/quality/duration/timing/severity/associated sxs/prior Treatment) Patient is a 40 y.o. female presenting with syncope. The history is provided by the patient (The patient states that she became very stressed and then passed out. She states she was having a panic attack).  Loss of Consciousness Episode history:  Single Most recent episode:  Today Progression:  Resolved Chronicity:  New Context: not blood draw   Associated symptoms: chest pain   Associated symptoms: no headaches and no seizures     Past Medical History  Diagnosis Date  . Hypertension   . Hypercholesterolemia   . GERD (gastroesophageal reflux disease)   . Back pain   . Bipolar 1 disorder ([place])   . Bulging disc   . Degenerative disc disease   . COPD (chronic obstructive pulmonary disease) ([place])   . Asthma   . Shortness of breath   . Sciatica   . PTSD (post-traumatic stress disorder)   . Depression with anxiety    Past Surgical History  Procedure Laterality Date  . Carpal tunnel release Bilateral   . Tubal ligation    . Cholecystectomy N/A 04/26/2013    Procedure: LAPAROSCOPIC CHOLECYSTECTOMY;  Surgeon: Jamesetta So, MD;  Location: [facility];  Service: General;  Laterality: N/A;  . Incisional hernia repair N/A 10/04/2013    Procedure: HERNIA REPAIR INCISIONAL WITH MESH;  Surgeon: Jamesetta So, MD;  Location: [facility];  Service: General;  Laterality: N/A;  . Insertion of mesh N/A 10/04/2013    Procedure: INSERTION OF MESH;  Surgeon: Jamesetta So, MD;  Location: [facility];  Service: General;  Laterality: N/A;  . Colonoscopy with propofol N/A 09/25/2014    RMR: Pancolonic diverticulosis. Multiple colonic polyps removed as described above. Status post segmental bisopsy.  .  Esophagogastroduodenoscopy (egd) with propofol N/A 09/25/2014    RMR: Normal esophagus. Abnormal gastric mucosa status post biopsy. Hiatal hernia.   . Biopsy N/A 09/25/2014    Procedure: BIOPSY;  Surgeon: Daneil Dolin, MD;  Location: [facility];  Service: Endoscopy;  Laterality: N/A;  Gastric, Ascending Colon, Descending/Sigmoid Colon   . Polypectomy N/A 09/25/2014    Procedure: POLYPECTOMY;  Surgeon: Daneil Dolin, MD;  Location: [facility];  Service: Endoscopy;  Laterality: N/A;  Cecal, Ascending Colon   . Shoulder surgery     Family History  Problem Relation Age of Onset  . Diabetes Father   . Colon cancer Maternal Grandmother    Social History  Substance Use Topics  . Smoking status: Current Every Day Smoker -- 1.00 packs/day for 20 years    Types: Cigarettes  . Smokeless tobacco: Never Used  . Alcohol Use: No     Comment: ETOH abuse in past, quit a few years ago.    OB History    Gravida Para Term Preterm AB TAB SAB Ectopic Multiple Living   '11 2 2  9  9   2     ' Review of Systems  Constitutional: Negative for appetite change and fatigue.  HENT: Negative for congestion, ear discharge and sinus pressure.   Eyes: Negative for discharge.  Respiratory: Negative for cough.   Cardiovascular: Positive for chest pain and syncope.  Gastrointestinal: Negative for abdominal pain and diarrhea.  Genitourinary: Negative for frequency  and hematuria.  Musculoskeletal: Negative for back pain.  Skin: Negative for rash.  Neurological: Negative for seizures and headaches.  Psychiatric/Behavioral: Negative for hallucinations.      Allergies  Clarithromycin and Darvocet  Home Medications   Prior to Admission medications   Medication Sig Start Date End Date Taking? Authorizing Provider  albuterol (PROVENTIL HFA;VENTOLIN HFA) 108 (90 BASE) MCG/ACT inhaler Inhale 2 puffs into the lungs 4 (four) times daily.    Yes Historical Provider, MD  atenolol (TENORMIN) 25 MG tablet Take 25 mg by mouth daily.    Yes Historical Provider, MD  beclomethasone (QVAR) 80 MCG/ACT inhaler Inhale 2 puffs into the lungs 2 (two) times daily.   Yes Historical Provider, MD  busPIRone (BUSPAR) 5 MG tablet Take 5 mg by mouth 3 (three) times daily.   Yes Historical Provider, MD  cholecalciferol (VITAMIN D) 1000 UNITS tablet Take 1,000 Units by mouth daily.    Yes Historical Provider, MD  diazepam (VALIUM) 10 MG tablet Take 10 mg by mouth 3 (three) times daily.   Yes Historical Provider, MD  escitalopram (LEXAPRO) 10 MG tablet Take 10 mg by mouth daily. For DEPRESSION   Yes Historical Provider, MD  hydrochlorothiazide (HYDRODIURIL) 25 MG tablet Take 25 mg by mouth daily. For FLUID   Yes Historical Provider, MD  lamoTRIgine (LAMICTAL) 200 MG tablet Take 200 mg by mouth 2 (two) times daily. For BIPOLAR   Yes Historical Provider, MD  loratadine (CLARITIN) 10 MG tablet Take 10 mg by mouth daily.    Yes Historical Provider, MD  Oxycodone HCl 10 MG TABS Take 10-20 mg by mouth every 6 (six) hours as needed (pain).   Yes Historical Provider, MD  pantoprazole (PROTONIX) 40 MG tablet Take 1 tablet (40 mg total) by mouth daily. 30 minutes before breakfast 02/06/15  Yes Carlis Stable, NP  cyclobenzaprine (FLEXERIL) 10 MG tablet Take 1 tablet (10 mg total) by mouth 3 (three) times daily as needed for muscle spasms. 01/20/14   Rolland Porter, MD  dicyclomine (BENTYL) 10 MG capsule Take 1 capsule (10 mg total) by mouth 4 (four) times daily -  before meals and at bedtime. For loose stools Patient not taking: Reported on 12/17/2015 08/27/14   Orvil Feil, NP  peg 3350 powder (MOVIPREP) 100 G SOLR Take 1 kit (200 g total) by mouth as directed. Patient not taking: Reported on 12/17/2015 08/27/14   Daneil Dolin, MD  promethazine (PHENERGAN) 25 MG tablet Take 25 mg by mouth every 6 (six) hours as needed for nausea or vomiting.    Historical Provider, MD  traMADol (ULTRAM) 50 MG tablet Take 1 tablet (50 mg total) by mouth every 6 (six) hours as  needed. Patient not taking: Reported on 12/17/2015 10/21/14   Lajean Saver, MD   BP 98/60 mmHg  Pulse 64  Temp(Src) 98.6 F (37 C) (Oral)  Resp 18  Ht '5\' 5"'  (1.651 m)  Wt 215 lb (97.523 kg)  BMI 35.78 kg/m2  SpO2 100%  LMP 11/26/2015 Physical Exam  Constitutional: She is oriented to person, place, and time. She appears well-developed.  HENT:  Head: Normocephalic.  Eyes: Conjunctivae and EOM are normal. No scleral icterus.  Neck: Neck supple. No thyromegaly present.  Cardiovascular: Normal rate and regular rhythm.  Exam reveals no gallop and no friction rub.   No murmur heard. Pulmonary/Chest: No stridor. She has no wheezes. She has no rales. She exhibits no tenderness.  Abdominal: She exhibits no distension. There is no  tenderness. There is no rebound.  Musculoskeletal: Normal range of motion. She exhibits no edema.  Lymphadenopathy:    She has no cervical adenopathy.  Neurological: She is oriented to person, place, and time. She exhibits normal muscle tone. Coordination normal.  Skin: No rash noted. No erythema.  Psychiatric: She has a normal mood and affect. Her behavior is normal.    ED Course  Procedures (including critical care time) Labs Review Labs Reviewed  CBC WITH DIFFERENTIAL/PLATELET - Abnormal; Notable for the following:    WBC 11.3 (*)    All other components within normal limits  COMPREHENSIVE METABOLIC PANEL - Abnormal; Notable for the following:    Potassium 3.3 (*)    BUN 5 (*)    Calcium 8.6 (*)    AST 12 (*)    ALT 12 (*)    All other components within normal limits  I-STAT TROPOININ, ED    Imaging Review Dg Chest 2 View  12/17/2015  CLINICAL DATA:  Syncope EXAM: CHEST  2 VIEW COMPARISON:  04/25/2013 chest radiograph. FINDINGS: Stable cardiomediastinal silhouette with normal heart size. No pneumothorax. No pleural effusion. Lungs appear clear, with no acute consolidative airspace disease and no pulmonary edema. IMPRESSION: No active cardiopulmonary  disease. Electronically Signed   By: Ilona Sorrel M.D.   On: 12/17/2015 17:14   Dg Lumbar Spine Complete  12/17/2015  CLINICAL DATA:  Pt reports was at home having a panic attack. Pt says chest started hurting worse than usual and she got more panicked and passed out. Reports has chronic back pain that got worse after the fall. --pt stated chest pain was in the middle and does not remember the orient of her fall. EXAM: LUMBAR SPINE - COMPLETE 4+ VIEW COMPARISON:  CT, 01/20/2014 FINDINGS: No fracture.  No spondylolisthesis. There is moderate loss disc height with endplate sclerosis and spurring from L2-L3 through L5-S1, similar to the prior CT. The L1-L2 disc is well maintained in height. Facet joints are relatively well maintained. Soft tissues are unremarkable. IMPRESSION: 1. No fracture or acute finding. 2. Degenerative changes as described. Electronically Signed   By: Lajean Manes M.D.   On: 12/17/2015 17:15   Ct Head Wo Contrast  12/17/2015  CLINICAL DATA:  Panic attack, syncope, back pain. Known frontal cyst. EXAM: CT HEAD WITHOUT CONTRAST TECHNIQUE: Contiguous axial images were obtained from the base of the skull through the vertex without intravenous contrast. COMPARISON:  Head CT dated 10/21/2014. FINDINGS: Ventricles remain stable in size and configuration. The left temporal choroidal fissure cyst is stable. There is no mass, hemorrhage, edema or other evidence of acute parenchymal abnormality. No extra-axial hemorrhage. Osseous structures are unremarkable. Visualized upper paranasal sinuses are clear. Superficial soft tissues are unremarkable. IMPRESSION: Negative head CT. Electronically Signed   By: Franki Cabot M.D.   On: 12/17/2015 17:38   I have personally reviewed and evaluated these images and lab results as part of my medical decision-making.   EKG Interpretation None      MDM   Final diagnoses:  Syncope, non cardiac    Patient with syncopal episode after having panic attack.  Normal EKG normal labs. Suspect stress related syncope patient is to follow-up with PCP    Milton Ferguson, MD 12/17/15 [phone]

## 2015-12-17 NOTE — Discharge Instructions (Signed)
Follow up with your md if any problems °

## 2016-05-06 ENCOUNTER — Other Ambulatory Visit: Payer: Self-pay | Admitting: Physician Assistant

## 2016-05-25 ENCOUNTER — Other Ambulatory Visit: Payer: Self-pay | Admitting: *Deleted

## 2016-05-25 ENCOUNTER — Other Ambulatory Visit: Payer: Self-pay | Admitting: Physician Assistant

## 2016-05-25 MED ORDER — OXYCODONE HCL 10 MG PO TABS: 10.0000 mg | ORAL_TABLET | Freq: Three times a day (TID) | ORAL | 0 refills | Status: DC | PRN

## 2016-05-25 NOTE — Telephone Encounter (Signed)
Script printed for 1 times, must have appointment.

## 2016-05-25 NOTE — Telephone Encounter (Signed)
This patient needs to return to [facility] for chronic narcotic medications.

## 2016-05-25 NOTE — Telephone Encounter (Signed)
Dosing? Strength?

## 2016-05-25 NOTE — Telephone Encounter (Signed)
Patient aware.

## 2016-05-25 NOTE — Telephone Encounter (Signed)
Patient aware and states that Dr.Buttler will not refill any pain medications for your patients. Please advise and send back to the pools.

## 2016-05-25 NOTE — Telephone Encounter (Signed)
p 

## 2016-05-25 NOTE — Telephone Encounter (Signed)
Prior auth for [facility] has been completed by Jackelyn Poling, awaiting results.

## 2016-05-25 NOTE — Telephone Encounter (Signed)
Patient states oxycodone HCl 10mg  1-2 times every 6 hours.

## 2016-05-26 ENCOUNTER — Telehealth: Payer: Self-pay

## 2016-05-26 MED ORDER — OXYCODONE HCL 10 MG PO TABS: 10.0000 mg | ORAL_TABLET | Freq: Three times a day (TID) | ORAL | 0 refills | Status: AC | PRN

## 2016-05-26 NOTE — Telephone Encounter (Signed)
14 day script printed, have not heard from [facility] on the prior auth

## 2016-05-26 NOTE — Telephone Encounter (Signed)
NA - script up front -- pt not aware

## 2016-05-30 ENCOUNTER — Telehealth: Payer: Self-pay

## 2016-05-31 NOTE — Telephone Encounter (Signed)
x

## 2016-06-02 NOTE — Telephone Encounter (Signed)
rx picked up

## 2016-06-09 ENCOUNTER — Other Ambulatory Visit: Payer: Self-pay | Admitting: Physician Assistant

## 2016-06-09 NOTE — Telephone Encounter (Signed)
Pt hasn't been seen here in our office. Ok to refill?

## 2016-06-24 ENCOUNTER — Telehealth: Payer: Self-pay | Admitting: Physician Assistant

## 2016-06-24 NOTE — Telephone Encounter (Signed)
Last fill was 05/30/16, is not due a refill.

## 2016-06-24 NOTE — Telephone Encounter (Signed)
Patient states she went to ER at [facility] on 10/3 and she has a broken foot. Patient is requesting a refill on her pain medication

## 2016-06-24 NOTE — Telephone Encounter (Signed)
Patient aware that that it is to early to have filled.

## 2016-06-27 ENCOUNTER — Telehealth: Payer: Self-pay | Admitting: Physician Assistant

## 2016-06-27 MED ORDER — OXYCODONE HCL 10 MG PO TABS: 10.0000 mg | ORAL_TABLET | Freq: Four times a day (QID) | ORAL | 0 refills | Status: DC

## 2016-06-27 NOTE — Telephone Encounter (Signed)
One printed 

## 2016-06-27 NOTE — Telephone Encounter (Signed)
Patient aware that rx ready to be picked up.  

## 2016-06-28 ENCOUNTER — Other Ambulatory Visit: Payer: Self-pay | Admitting: *Deleted

## 2016-06-28 NOTE — Telephone Encounter (Signed)
Yes, she got 14 day script until the prior auth went through on medicaid.  There is a note that stated it went through so I went back to a 30 day supply.

## 2016-06-28 NOTE — Telephone Encounter (Signed)
last filled 06/13/16 for #[id number]

## 2016-06-29 MED ORDER — OXYCODONE HCL 10 MG PO TABS: 10.0000 mg | ORAL_TABLET | Freq: Three times a day (TID) | ORAL | 0 refills | Status: DC | PRN

## 2016-07-06 ENCOUNTER — Other Ambulatory Visit: Payer: Self-pay | Admitting: Physician Assistant

## 2016-07-29 ENCOUNTER — Ambulatory Visit: Payer: Self-pay | Admitting: Physician Assistant

## 2016-08-03 ENCOUNTER — Other Ambulatory Visit: Payer: Self-pay | Admitting: Physician Assistant

## 2016-08-10 ENCOUNTER — Encounter: Payer: Self-pay | Admitting: Physician Assistant

## 2016-08-10 ENCOUNTER — Ambulatory Visit (INDEPENDENT_AMBULATORY_CARE_PROVIDER_SITE_OTHER): Payer: Medicaid Other | Admitting: Physician Assistant

## 2016-08-10 VITALS — BP 124/89 | HR 80 | Temp 97.3°F | Ht 65.0 in | Wt 218.0 lb

## 2016-08-10 DIAGNOSIS — G8929 Other chronic pain: Secondary | ICD-10-CM | POA: Diagnosis not present

## 2016-08-10 DIAGNOSIS — K219 Gastro-esophageal reflux disease without esophagitis: Secondary | ICD-10-CM | POA: Diagnosis not present

## 2016-08-10 DIAGNOSIS — J449 Chronic obstructive pulmonary disease, unspecified: Secondary | ICD-10-CM | POA: Insufficient documentation

## 2016-08-10 DIAGNOSIS — Z86010 Personal history of colonic polyps: Secondary | ICD-10-CM | POA: Diagnosis not present

## 2016-08-10 DIAGNOSIS — F319 Bipolar disorder, unspecified: Secondary | ICD-10-CM

## 2016-08-10 DIAGNOSIS — R002 Palpitations: Secondary | ICD-10-CM | POA: Diagnosis not present

## 2016-08-10 DIAGNOSIS — J4520 Mild intermittent asthma, uncomplicated: Secondary | ICD-10-CM | POA: Diagnosis not present

## 2016-08-10 DIAGNOSIS — R609 Edema, unspecified: Secondary | ICD-10-CM

## 2016-08-10 DIAGNOSIS — J209 Acute bronchitis, unspecified: Secondary | ICD-10-CM | POA: Diagnosis not present

## 2016-08-10 DIAGNOSIS — M5441 Lumbago with sciatica, right side: Secondary | ICD-10-CM

## 2016-08-10 DIAGNOSIS — R1013 Epigastric pain: Secondary | ICD-10-CM | POA: Diagnosis not present

## 2016-08-10 DIAGNOSIS — K589 Irritable bowel syndrome without diarrhea: Secondary | ICD-10-CM | POA: Insufficient documentation

## 2016-08-10 DIAGNOSIS — K580 Irritable bowel syndrome with diarrhea: Secondary | ICD-10-CM

## 2016-08-10 DIAGNOSIS — K5730 Diverticulosis of large intestine without perforation or abscess without bleeding: Secondary | ICD-10-CM

## 2016-08-10 MED ORDER — FLUCONAZOLE 150 MG PO TABS
150.0000 mg | ORAL_TABLET | Freq: Once | ORAL | 0 refills | Status: AC
Start: 2016-08-10 — End: 2016-08-10

## 2016-08-10 MED ORDER — AZITHROMYCIN 250 MG PO TABS: ORAL_TABLET | ORAL | 0 refills | Status: DC

## 2016-08-10 MED ORDER — TIZANIDINE HCL 4 MG PO CAPS: 4.0000 mg | ORAL_CAPSULE | Freq: Three times a day (TID) | ORAL | 2 refills | Status: DC

## 2016-08-10 MED ORDER — OXYCODONE HCL 10 MG PO TABS: 10.0000 mg | ORAL_TABLET | Freq: Three times a day (TID) | ORAL | 0 refills | Status: DC | PRN

## 2016-08-10 MED ORDER — OXYCODONE HCL 10 MG PO TABS: 10.0000 mg | ORAL_TABLET | Freq: Three times a day (TID) | ORAL | 0 refills | Status: DC

## 2016-08-10 NOTE — Patient Instructions (Addendum)

## 2016-08-10 NOTE — Progress Notes (Signed)
BP 124/89   Pulse 80   Temp 97.3 F (36.3 C) (Oral)   Ht '5\' 5"'  (1.651 m)   Wt 218 lb (98.9 kg)   BMI 36.28 kg/m    Subjective:    Patient ID: Deborah Barnes, female    DOB: Nov 13, 1975, 40 y.o.   MRN: [id number]  Deborah Barnes is a 40 y.o. female presenting on 08/10/2016 for Medication check  HPI Patient here to be established as new patient at [facility].  This patient is known to me from [facility]. This patient comes in for periodic recheck on medications and conditions. All medications are reviewed today. There are no reports of any problems with the medications. All of the medical conditions are reviewed and updated.  Lab work is reviewed and will be ordered as medically necessary. There are no new problems reported with today's visit.   Past Medical History:  Diagnosis Date  . Asthma   . Back pain   . Bipolar 1 disorder ([place])   . Bulging disc   . COPD (chronic obstructive pulmonary disease) ([place])   . Degenerative disc disease   . Depression with anxiety   . GERD (gastroesophageal reflux disease)   . Hypercholesterolemia   . Hypertension   . PTSD (post-traumatic stress disorder)   . Sciatica   . Shortness of breath    Relevant past medical, surgical, family and social history reviewed and updated as indicated. Interim medical history since our last visit reviewed. Allergies and medications reviewed and updated.   Data reviewed from any sources in EPIC.  Review of Systems  Constitutional: Negative.  Negative for activity change, fatigue and fever.  HENT: Positive for congestion and sinus pain.   Eyes: Negative.   Respiratory: Positive for cough. Negative for wheezing.   Cardiovascular: Negative.  Negative for chest pain and palpitations.  Gastrointestinal: Positive for abdominal pain and diarrhea. Negative for constipation and vomiting.  Endocrine: Negative.   Genitourinary: Negative.  Negative for dysuria.    Musculoskeletal: Positive for arthralgias and back pain.  Skin: Negative.   Allergic/Immunologic: Negative.   Neurological: Negative.   Hematological: Negative.   Psychiatric/Behavioral: Positive for decreased concentration, dysphoric mood and sleep disturbance. The patient is nervous/anxious.      Social History   Social History  . Marital status: Divorced    Spouse name: N/A  . Number of children: N/A  . Years of education: N/A   Occupational History  . Not on file.   Social History Main Topics  . Smoking status: Current Every Day Smoker    Packs/day: 1.00    Years: 20.00    Types: Cigarettes  . Smokeless tobacco: Never Used  . Alcohol use No     Comment: ETOH abuse in past, quit a few years ago.   . Drug use: No  . Sexual activity: Yes    Birth control/ protection: Surgical   Other Topics Concern  . Not on file   Social History Narrative  . No narrative on file    Past Surgical History:  Procedure Laterality Date  . BIOPSY N/A 09/25/2014   Procedure: BIOPSY;  Surgeon: Daneil Dolin, MD;  Location: [facility];  Service: Endoscopy;  Laterality: N/A;  Gastric, Ascending Colon, Descending/Sigmoid Colon   . CARPAL TUNNEL RELEASE Bilateral   . CHOLECYSTECTOMY N/A 04/26/2013   Procedure: LAPAROSCOPIC CHOLECYSTECTOMY;  Surgeon: Jamesetta So, MD;  Location: [facility];  Service: General;  Laterality: N/A;  .  COLONOSCOPY WITH PROPOFOL N/A 09/25/2014   RMR: Pancolonic diverticulosis. Multiple colonic polyps removed as described above. Status post segmental bisopsy.  . ESOPHAGOGASTRODUODENOSCOPY (EGD) WITH PROPOFOL N/A 09/25/2014   RMR: Normal esophagus. Abnormal gastric mucosa status post biopsy. Hiatal hernia.   Fatima Blank HERNIA REPAIR N/A 10/04/2013   Procedure: HERNIA REPAIR INCISIONAL WITH MESH;  Surgeon: Jamesetta So, MD;  Location: [facility];  Service: General;  Laterality: N/A;  . INSERTION OF MESH N/A 10/04/2013   Procedure: INSERTION OF MESH;  Surgeon: Jamesetta So, MD;   Location: [facility];  Service: General;  Laterality: N/A;  . POLYPECTOMY N/A 09/25/2014   Procedure: POLYPECTOMY;  Surgeon: Daneil Dolin, MD;  Location: [facility];  Service: Endoscopy;  Laterality: N/A;  Cecal, Ascending Colon   . SHOULDER SURGERY    . TUBAL LIGATION      Family History  Problem Relation Age of Onset  . Diabetes Father   . Colon cancer Maternal Grandmother       Medication List       Accurate as of 08/10/16 10:42 PM. Always use your most recent med list.          albuterol 108 (90 Base) MCG/ACT inhaler Commonly known as:  PROVENTIL HFA;VENTOLIN HFA Inhale 2 puffs into the lungs 4 (four) times daily.   atenolol 25 MG tablet Commonly known as:  TENORMIN TAKE ONE (1) TABLET EACH DAY   azithromycin 250 MG tablet Commonly known as:  ZITHROMAX Z-PAK As directed   beclomethasone 80 MCG/ACT inhaler Commonly known as:  QVAR Inhale 2 puffs into the lungs 2 (two) times daily.   busPIRone 5 MG tablet Commonly known as:  BUSPAR Take 10 mg by mouth 3 (three) times daily.   cholecalciferol 1000 units tablet Commonly known as:  VITAMIN D Take 1,000 Units by mouth daily.   diazepam 10 MG tablet Commonly known as:  VALIUM Take 2 mg by mouth 3 (three) times daily.   dicyclomine 10 MG capsule Commonly known as:  BENTYL Take 1 capsule (10 mg total) by mouth 4 (four) times daily -  before meals and at bedtime. For loose stools   escitalopram 10 MG tablet Commonly known as:  LEXAPRO Take 10 mg by mouth daily. For DEPRESSION   fluconazole 150 MG tablet Commonly known as:  DIFLUCAN Take 1 tablet (150 mg total) by mouth once.   hydrochlorothiazide 25 MG tablet Commonly known as:  HYDRODIURIL TAKE ONE (1) TABLET EACH DAY   lamoTRIgine 200 MG tablet Commonly known as:  LAMICTAL Take 200 mg by mouth 2 (two) times daily. For BIPOLAR   loratadine 10 MG tablet Commonly known as:  CLARITIN Take 10 mg by mouth daily.   Oxycodone HCl 10 MG Tabs Take 1-2 tablets  (10-20 mg total) by mouth every 8 (eight) hours as needed.   Oxycodone HCl 10 MG Tabs Take 1-2 tablets (10-20 mg total) by mouth 3 (three) times daily. Use for severe pain   pantoprazole 40 MG tablet Commonly known as:  PROTONIX Take 1 tablet (40 mg total) by mouth daily. 30 minutes before breakfast   peg 3350 powder 100 g Solr Commonly known as:  MOVIPREP Take 1 kit (200 g total) by mouth as directed.   promethazine 25 MG tablet Commonly known as:  PHENERGAN Take 25 mg by mouth every 6 (six) hours as needed for nausea or vomiting.   tiZANidine 4 MG capsule Commonly known as:  ZANAFLEX Take 1 capsule (4 mg total)  by mouth 3 (three) times daily.   traMADol 50 MG tablet Commonly known as:  ULTRAM Take 1 tablet (50 mg total) by mouth every 6 (six) hours as needed.          Objective:    BP 124/89   Pulse 80   Temp 97.3 F (36.3 C) (Oral)   Ht '5\' 5"'  (1.651 m)   Wt 218 lb (98.9 kg)   BMI 36.28 kg/m   Allergies  Allergen Reactions  . Clarithromycin Diarrhea  . Darvocet [Propoxyphene N-Acetaminophen] Rash   Wt Readings from Last 3 Encounters:  08/10/16 218 lb (98.9 kg)  12/17/15 215 lb (97.5 kg)  10/21/14 231 lb (104.8 kg)    Physical Exam  Constitutional: She is oriented to person, place, and time. She appears well-developed and well-nourished.  HENT:  Head: Normocephalic and atraumatic.  Right Ear: A middle ear effusion is present.  Left Ear: A middle ear effusion is present.  Nose: Mucosal edema present. Right sinus exhibits no frontal sinus tenderness. Left sinus exhibits no frontal sinus tenderness.  Mouth/Throat: Posterior oropharyngeal erythema present. No oropharyngeal exudate or tonsillar abscesses.  Eyes: Conjunctivae and EOM are normal. Pupils are equal, round, and reactive to light.  Neck: Normal range of motion. Neck supple.  Cardiovascular: Normal rate, regular rhythm, normal heart sounds and intact distal pulses.   Pulmonary/Chest: Effort normal and  breath sounds normal.  Abdominal: Soft. Bowel sounds are normal.  Neurological: She is alert and oriented to person, place, and time. She has normal reflexes.  Skin: Skin is warm and dry. No rash noted.  Psychiatric: She has a normal mood and affect. Her behavior is normal. Judgment and thought content normal.  Nursing note and vitals reviewed.       Assessment & Plan:   1. Diverticulosis of colon without hemorrhage  2. Abdominal pain, chronic, epigastric  3. History of colonic polyps  4. Chronic midline low back pain with right-sided sciatica - Oxycodone HCl 10 MG TABS; Take 1-2 tablets (10-20 mg total) by mouth every 8 (eight) hours as needed.  Dispense: 180 tablet; Refill: 0 - Oxycodone HCl 10 MG TABS; Take 1-2 tablets (10-20 mg total) by mouth 3 (three) times daily. Use for severe pain  Dispense: 180 tablet; Refill: 0 - tiZANidine (ZANAFLEX) 4 MG capsule; Take 1 capsule (4 mg total) by mouth 3 (three) times daily.  Dispense: 90 capsule; Refill: 2  5. Palpitations  6. Edema, unspecified type  7. Irritable bowel syndrome with diarrhea  8. Bipolar I disorder ([place])  9. Gastroesophageal reflux disease without esophagitis  10. Mild intermittent asthma, unspecified whether complicated  11. Acute bronchitis, unspecified organism - azithromycin (ZITHROMAX Z-PAK) 250 MG tablet; As directed  Dispense: 6 tablet; Refill: 0 - fluconazole (DIFLUCAN) 150 MG tablet; Take 1 tablet (150 mg total) by mouth once.  Dispense: 1 tablet; Refill: 0   Continue all other maintenance medications as listed above. Educational handout given for acute bronchitis  Follow up plan: Return in about 2 months (around 10/10/2016) for recheck.  Terald Sleeper PA-C [facility] [address]  [place], [place] [address] [phone]   08/10/2016, 10:42 PM

## 2016-08-17 ENCOUNTER — Telehealth: Payer: Self-pay | Admitting: Physician Assistant

## 2016-08-17 NOTE — Telephone Encounter (Signed)
Work note reprinted to front for pt pick up Pt notified

## 2016-08-18 ENCOUNTER — Ambulatory Visit (INDEPENDENT_AMBULATORY_CARE_PROVIDER_SITE_OTHER): Payer: Medicaid Other | Admitting: Orthopaedic Surgery

## 2016-09-08 ENCOUNTER — Other Ambulatory Visit: Payer: Self-pay | Admitting: Physician Assistant

## 2016-09-16 ENCOUNTER — Telehealth: Payer: Self-pay | Admitting: Physician Assistant

## 2016-09-16 DIAGNOSIS — M5441 Lumbago with sciatica, right side: Principal | ICD-10-CM

## 2016-09-16 DIAGNOSIS — G8929 Other chronic pain: Secondary | ICD-10-CM

## 2016-09-22 MED ORDER — OXYCODONE HCL 10 MG PO TABS: 10.0000 mg | ORAL_TABLET | Freq: Three times a day (TID) | ORAL | 0 refills | Status: DC | PRN

## 2016-09-22 NOTE — Telephone Encounter (Signed)
Printed rx  

## 2016-09-29 ENCOUNTER — Emergency Department (HOSPITAL_COMMUNITY): Payer: Medicaid Other

## 2016-09-29 ENCOUNTER — Emergency Department (HOSPITAL_COMMUNITY)
Admission: EM | Admit: 2016-09-29 | Discharge: 2016-09-29 | Disposition: A | Discharge status: Home / Self Care | Payer: Medicaid Other | Attending: Emergency Medicine | Admitting: Emergency Medicine

## 2016-09-29 ENCOUNTER — Encounter (HOSPITAL_COMMUNITY): Payer: Self-pay | Admitting: *Deleted

## 2016-09-29 DIAGNOSIS — I10 Essential (primary) hypertension: Secondary | ICD-10-CM | POA: Diagnosis not present

## 2016-09-29 DIAGNOSIS — M5136 Other intervertebral disc degeneration, lumbar region: Secondary | ICD-10-CM

## 2016-09-29 DIAGNOSIS — F17210 Nicotine dependence, cigarettes, uncomplicated: Secondary | ICD-10-CM | POA: Diagnosis not present

## 2016-09-29 DIAGNOSIS — M1611 Unilateral primary osteoarthritis, right hip: Secondary | ICD-10-CM

## 2016-09-29 DIAGNOSIS — J449 Chronic obstructive pulmonary disease, unspecified: Secondary | ICD-10-CM | POA: Insufficient documentation

## 2016-09-29 DIAGNOSIS — J45909 Unspecified asthma, uncomplicated: Secondary | ICD-10-CM | POA: Insufficient documentation

## 2016-09-29 DIAGNOSIS — M549 Dorsalgia, unspecified: Secondary | ICD-10-CM | POA: Diagnosis present

## 2016-09-29 DIAGNOSIS — Z79899 Other long term (current) drug therapy: Secondary | ICD-10-CM | POA: Diagnosis not present

## 2016-09-29 DIAGNOSIS — M5431 Sciatica, right side: Secondary | ICD-10-CM | POA: Diagnosis not present

## 2016-09-29 MED ORDER — DIAZEPAM 5 MG PO TABS
10.0000 mg | ORAL_TABLET | Freq: Once | ORAL | Status: AC
  Administered 2016-09-29: 10 mg via ORAL
  Filled 2016-09-29: qty 2

## 2016-09-29 MED ORDER — KETOROLAC TROMETHAMINE 10 MG PO TABS
10.0000 mg | ORAL_TABLET | Freq: Once | ORAL | Status: AC
  Administered 2016-09-29: 10 mg via ORAL
  Filled 2016-09-29: qty 1

## 2016-09-29 NOTE — Discharge Instructions (Signed)
The x-ray of your lumbar spine shows multiple areas of degenerative disc disease. The x-ray of your right hip shows arthritis. Please discuss these with your primary physician.  Please keep a journal of your pain and discomfort. Heating pad to your back and hip area may be helpful. Continue your current medications.

## 2016-09-29 NOTE — ED Triage Notes (Signed)
Pt comes in with right leg pain starting last week. Pt states this is a chronic issue and she was never referred by her PCP to anyone that could help her. Pt ambulatory with her cane.

## 2016-09-29 NOTE — ED Provider Notes (Signed)
[facility] DEPT Provider Note   CSN: [id number] Arrival date & time: 09/29/16  1138     History   Chief Complaint Chief Complaint  Patient presents with  . Leg Pain    HPI Deborah Barnes is a 41 y.o. female.  Pap patient is a 41 year old female who presents to the emergency department with a complaint of back pain and right leg pain.  The patient states that at age 36 she had an injury to her back, and was told that she strained her sciatic nerve. She states she's been having problems with her back since that time. She was recently seen by her primary physician and was advised to see a specialist. She did not go to see the specialist because of other situations that were going on in her life at the time. She has an appointment with her primary physician at the end of this month, but states she is developing pain now that is going down her leg, causing spasm and cramping, and she doesn't feel as though she can take this pain anymore. She states that it interrupts her sleep, and interrupt her activities of daily living. She has not had any loss of control of bowels or bladder. She's not had any unusual numbness in the saddle area or in the private areas on. She states she has had some falls. She presents at this time because she wants to know "what's going on".   The history is provided by the patient.    Past Medical History:  Diagnosis Date  . Asthma   . Back pain   . Bipolar 1 disorder ([place])   . Bulging disc   . COPD (chronic obstructive pulmonary disease) ([place])   . Degenerative disc disease   . Depression with anxiety   . GERD (gastroesophageal reflux disease)   . Hypercholesterolemia   . Hypertension   . PTSD (post-traumatic stress disorder)   . Sciatica   . Shortness of breath     Patient Active Problem List   Diagnosis Date Noted  . Chronic midline low back pain with right-sided sciatica 08/10/2016  . Palpitations 08/10/2016  . Edema 08/10/2016  . Irritable  bowel syndrome with diarrhea 08/10/2016  . Bipolar I disorder ([place]) 08/10/2016  . Gastroesophageal reflux disease without esophagitis 08/10/2016  . Mild intermittent asthma 08/10/2016  . Diverticulosis of colon without hemorrhage   . History of colonic polyps   . Chronic diarrhea of unknown origin   . Mucosal abnormality of stomach   . Loose stools 08/27/2014  . Abdominal pain, chronic, epigastric 08/27/2014  . Anal fissure 12/24/2013    Past Surgical History:  Procedure Laterality Date  . BIOPSY N/A 09/25/2014   Procedure: BIOPSY;  Surgeon: Daneil Dolin, MD;  Location: [facility];  Service: Endoscopy;  Laterality: N/A;  Gastric, Ascending Colon, Descending/Sigmoid Colon   . CARPAL TUNNEL RELEASE Bilateral   . CHOLECYSTECTOMY N/A 04/26/2013   Procedure: LAPAROSCOPIC CHOLECYSTECTOMY;  Surgeon: Jamesetta So, MD;  Location: [facility];  Service: General;  Laterality: N/A;  . COLONOSCOPY WITH PROPOFOL N/A 09/25/2014   RMR: Pancolonic diverticulosis. Multiple colonic polyps removed as described above. Status post segmental bisopsy.  . ESOPHAGOGASTRODUODENOSCOPY (EGD) WITH PROPOFOL N/A 09/25/2014   RMR: Normal esophagus. Abnormal gastric mucosa status post biopsy. Hiatal hernia.   Fatima Blank HERNIA REPAIR N/A 10/04/2013   Procedure: HERNIA REPAIR INCISIONAL WITH MESH;  Surgeon: Jamesetta So, MD;  Location: [facility];  Service: General;  Laterality: N/A;  .  INSERTION OF MESH N/A 10/04/2013   Procedure: INSERTION OF MESH;  Surgeon: Jamesetta So, MD;  Location: [facility];  Service: General;  Laterality: N/A;  . POLYPECTOMY N/A 09/25/2014   Procedure: POLYPECTOMY;  Surgeon: Daneil Dolin, MD;  Location: [facility];  Service: Endoscopy;  Laterality: N/A;  Cecal, Ascending Colon   . SHOULDER SURGERY    . TUBAL LIGATION      OB History    Gravida Para Term Preterm AB Living   '11 2 2   9 2   ' SAB TAB Ectopic Multiple Live Births   9               Home Medications    Prior to Admission medications     Medication Sig Start Date End Date Taking? Authorizing Provider  albuterol (PROVENTIL HFA;VENTOLIN HFA) 108 (90 BASE) MCG/ACT inhaler Inhale 2 puffs into the lungs 4 (four) times daily.    Yes Historical Provider, MD  atenolol (TENORMIN) 25 MG tablet TAKE ONE (1) TABLET EACH DAY 09/09/16  Yes Terald Sleeper, PA-C  azithromycin (ZITHROMAX Z-PAK) 250 MG tablet As directed 08/10/16  Yes Terald Sleeper, PA-C  beclomethasone (QVAR) 80 MCG/ACT inhaler Inhale 2 puffs into the lungs 2 (two) times daily.   Yes Historical Provider, MD  benzonatate (TESSALON) 200 MG capsule Take 200 mg by mouth 3 (three) times daily as needed for cough.   Yes Historical Provider, MD  busPIRone (BUSPAR) 5 MG tablet Take 10 mg by mouth 3 (three) times daily.    Yes Historical Provider, MD  carbamazepine (TEGRETOL) 200 MG tablet Take 200 mg by mouth 2 (two) times daily.   Yes Historical Provider, MD  cholecalciferol (VITAMIN D) 1000 UNITS tablet Take 1,000 Units by mouth daily.    Yes Historical Provider, MD  cyclobenzaprine (FLEXERIL) 10 MG tablet TAKE ONE (1) TABLET THREE (3) TIMES EACH DAY 09/09/16  Yes Terald Sleeper, PA-C  diazepam (VALIUM) 2 MG tablet Take 2 mg by mouth 2 (two) times daily.    Yes Historical Provider, MD  escitalopram (LEXAPRO) 20 MG tablet Take by mouth daily. For DEPRESSION    Yes Historical Provider, MD  gabapentin (NEURONTIN) 300 MG capsule Take 600 mg by mouth 2 (two) times daily.   Yes Historical Provider, MD  gemfibrozil (LOPID) 600 MG tablet TAKE ONE TABLET BY MOUTH TWICE DAILY 09/09/16  Yes Terald Sleeper, PA-C  hydrochlorothiazide (HYDRODIURIL) 25 MG tablet TAKE ONE (1) TABLET EACH DAY 05/06/16  Yes Terald Sleeper, PA-C  lamoTRIgine (LAMICTAL) 100 MG tablet Take 200 mg by mouth at bedtime. For BIPOLAR    Yes Historical Provider, MD  loratadine (CLARITIN) 10 MG tablet Take 10 mg by mouth daily.    Yes Historical Provider, MD  olopatadine (PATANOL) 0.1 % ophthalmic solution Place 1 drop into both eyes 2  (two) times daily.   Yes Historical Provider, MD  Oxycodone HCl 10 MG TABS Take 1-2 tablets (10-20 mg total) by mouth 3 (three) times daily. Use for severe pain 08/10/16  Yes Terald Sleeper, PA-C  pantoprazole (PROTONIX) 40 MG tablet Take 1 tablet (40 mg total) by mouth daily. 30 minutes before breakfast 02/06/15  Yes Carlis Stable, NP  promethazine (PHENERGAN) 25 MG tablet Take 25 mg by mouth every 6 (six) hours as needed for nausea or vomiting.   Yes Historical Provider, MD  triamcinolone cream (KENALOG) 0.1 % Apply 1 application topically 2 (two) times daily.   Yes Historical Provider,  MD  dicyclomine (BENTYL) 10 MG capsule Take 1 capsule (10 mg total) by mouth 4 (four) times daily -  before meals and at bedtime. For loose stools Patient not taking: Reported on 09/29/2016 08/27/14   Annitta Needs, NP  peg 3350 powder (MOVIPREP) 100 G SOLR Take 1 kit (200 g total) by mouth as directed. Patient not taking: Reported on 09/29/2016 08/27/14   Daneil Dolin, MD  tiZANidine (ZANAFLEX) 4 MG capsule Take 1 capsule (4 mg total) by mouth 3 (three) times daily. Patient not taking: Reported on 09/29/2016 08/10/16   Terald Sleeper, PA-C  traMADol (ULTRAM) 50 MG tablet Take 1 tablet (50 mg total) by mouth every 6 (six) hours as needed. Patient not taking: Reported on 09/29/2016 10/21/14   Lajean Saver, MD    Family History Family History  Problem Relation Age of Onset  . Diabetes Father   . Colon cancer Maternal Grandmother     Social History Social History  Substance Use Topics  . Smoking status: Current Every Day Smoker    Packs/day: 1.00    Years: 20.00    Types: Cigarettes  . Smokeless tobacco: Never Used  . Alcohol use No     Comment: ETOH abuse in past, quit a few years ago.      Allergies   Clarithromycin and Darvocet [propoxyphene n-acetaminophen]   Review of Systems Review of Systems  Constitutional: Negative for activity change.       All ROS Neg except as noted in HPI  HENT: Negative for  nosebleeds.   Eyes: Negative for photophobia and discharge.  Respiratory: Negative for cough, shortness of breath and wheezing.   Cardiovascular: Negative for chest pain and palpitations.  Gastrointestinal: Negative for abdominal pain and blood in stool.  Genitourinary: Negative for dysuria, frequency and hematuria.  Musculoskeletal: Positive for arthralgias and back pain. Negative for neck pain.  Skin: Negative.   Neurological: Negative for dizziness, seizures and speech difficulty.  Psychiatric/Behavioral: Negative for confusion and hallucinations.       Bipolar illness     Physical Exam Updated Vital Signs BP 112/85 (BP Location: Left Arm)   Pulse 91   Temp 98 F (36.7 C) (Oral)   Resp 20   Ht '5\' 5"'  (1.651 m)   Wt 99.3 kg   LMP 09/19/2016   SpO2 97%   BMI 36.44 kg/m   Physical Exam  Constitutional: She is oriented to person, place, and time. She appears well-developed and well-nourished.  Non-toxic appearance.  HENT:  Head: Normocephalic.  Right Ear: Tympanic membrane and external ear normal.  Left Ear: Tympanic membrane and external ear normal.  Eyes: EOM and lids are normal. Pupils are equal, round, and reactive to light.  Neck: Normal range of motion. Neck supple. Carotid bruit is not present.  Cardiovascular: Normal rate, regular rhythm, normal heart sounds, intact distal pulses and normal pulses.   Pulmonary/Chest: Breath sounds normal. No respiratory distress.  Abdominal: Soft. Bowel sounds are normal. There is no tenderness. There is no guarding.  Musculoskeletal:       Right hip: She exhibits decreased range of motion.       Lumbar back: She exhibits decreased range of motion, tenderness, pain and spasm.  Lymphadenopathy:       Head (right side): No submandibular adenopathy present.       Head (left side): No submandibular adenopathy present.    She has no cervical adenopathy.  Neurological: She is alert and oriented to person, place,  and time. She has normal  strength. No cranial nerve deficit or sensory deficit.  Skin: Skin is warm and dry.  Psychiatric: She has a normal mood and affect. Her speech is normal.   Patient is tearful during most of the examination.  Nursing note and vitals reviewed.    ED Treatments / Results  Labs (all labs ordered are listed, but only abnormal results are displayed) Labs Reviewed - No data to display  EKG  EKG Interpretation None       Radiology No results found.  Procedures Procedures (including critical care time)  Medications Ordered in ED Medications - No data to display   Initial Impression / Assessment and Plan / ED Course  I have reviewed the triage vital signs and the nursing notes.  Pertinent labs & imaging results that were available during my care of the patient were reviewed by me and considered in my medical decision making (see chart for details).  Clinical Course     **I have reviewed nursing notes, vital signs, and all appropriate lab and imaging results for this patient.*  Final Clinical Impressions(s) / ED Diagnoses   Vital signs reviewed. Pulse oximetry is 97% on room air. Within normal limits by my interpretation. The lumbar spine x-ray shows degenerative disc disease changes from the L2 to the S1 area. There is loss of disc space noted. There is sclerosis as well as spurring at each level.  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 There is also spurring appreciated.  I discussed these findings with the patient in terms which he understands. I also discussed with her that there no gross neurologic deficits appreciated at this time. We discussed that she needs to be seen by the specialist as previously suggested by her primary physician, and that she may need an MRI to get to the bottom of the issue of her pain and the interruptions in her activities of daily living. The patient acknowledges understanding of these discharge instructions. She  will continue her current medications, and see her primary physician as sone as possible.   Final diagnoses:  None    New Prescriptions New Prescriptions   No medications on file     Lily Kocher, PA-C 09/29/16 [place], MD 09/30/16 [phone]

## 2016-10-07 ENCOUNTER — Other Ambulatory Visit: Payer: Self-pay | Admitting: Physician Assistant

## 2016-10-11 ENCOUNTER — Ambulatory Visit: Payer: Medicaid Other | Admitting: Physician Assistant

## 2016-10-27 ENCOUNTER — Other Ambulatory Visit: Payer: Self-pay | Admitting: Physician Assistant

## 2016-11-11 ENCOUNTER — Other Ambulatory Visit: Payer: Self-pay | Admitting: Physician Assistant

## 2016-12-06 ENCOUNTER — Encounter (HOSPITAL_COMMUNITY): Payer: Self-pay | Admitting: Emergency Medicine

## 2016-12-06 ENCOUNTER — Emergency Department (HOSPITAL_COMMUNITY)
Admission: EM | Admit: 2016-12-06 | Discharge: 2016-12-06 | Disposition: A | Discharge status: Home / Self Care | Payer: Medicaid Other | Attending: Emergency Medicine | Admitting: Emergency Medicine

## 2016-12-06 DIAGNOSIS — R42 Dizziness and giddiness: Secondary | ICD-10-CM | POA: Insufficient documentation

## 2016-12-06 DIAGNOSIS — I10 Essential (primary) hypertension: Secondary | ICD-10-CM | POA: Insufficient documentation

## 2016-12-06 DIAGNOSIS — J45909 Unspecified asthma, uncomplicated: Secondary | ICD-10-CM | POA: Insufficient documentation

## 2016-12-06 DIAGNOSIS — Z79899 Other long term (current) drug therapy: Secondary | ICD-10-CM | POA: Insufficient documentation

## 2016-12-06 DIAGNOSIS — F17210 Nicotine dependence, cigarettes, uncomplicated: Secondary | ICD-10-CM | POA: Diagnosis not present

## 2016-12-06 DIAGNOSIS — J449 Chronic obstructive pulmonary disease, unspecified: Secondary | ICD-10-CM | POA: Diagnosis not present

## 2016-12-06 LAB — CBC WITH DIFFERENTIAL/PLATELET
BASOS ABS: 0 10*3/uL (ref 0.0–0.1)
Basophils Relative: 0 %
Eosinophils Absolute: 0.2 10*3/uL (ref 0.0–0.7)
Eosinophils Relative: 2 %
HEMATOCRIT: 41.1 % (ref 36.0–46.0)
HEMOGLOBIN: 14.2 g/dL (ref 12.0–15.0)
LYMPHS ABS: 3.6 10*3/uL (ref 0.7–4.0)
Lymphocytes Relative: 34 %
MCH: 32.1 pg (ref 26.0–34.0)
MCHC: 34.5 g/dL (ref 30.0–36.0)
MCV: 92.8 fL (ref 78.0–100.0)
MONOS PCT: 5 %
Monocytes Absolute: 0.6 10*3/uL (ref 0.1–1.0)
Neutro Abs: 6.4 10*3/uL (ref 1.7–7.7)
Neutrophils Relative %: 59 %
Platelets: 386 10*3/uL (ref 150–400)
RBC: 4.43 MIL/uL (ref 3.87–5.11)
RDW: 13.2 % (ref 11.5–15.5)
WBC: 10.8 10*3/uL — ABNORMAL HIGH (ref 4.0–10.5)

## 2016-12-06 LAB — CARBAMAZEPINE LEVEL, TOTAL: Carbamazepine Lvl: 3.8 ug/mL — ABNORMAL LOW (ref 4.0–12.0)

## 2016-12-06 LAB — BASIC METABOLIC PANEL
ANION GAP: 11 (ref 5–15)
BUN: 10 mg/dL (ref 6–20)
CHLORIDE: 99 mmol/L — AB (ref 101–111)
CO2: 26 mmol/L (ref 22–32)
Calcium: 9.5 mg/dL (ref 8.9–10.3)
Creatinine, Ser: 0.76 mg/dL (ref 0.44–1.00)
GFR calc Af Amer: >60 mL/min (ref >60)
GFR calc non Af Amer: >60 mL/min (ref >60)
GLUCOSE: 83 mg/dL (ref 65–99)
POTASSIUM: 3 mmol/L — AB (ref 3.5–5.1)
Sodium: 136 mmol/L (ref 135–145)

## 2016-12-06 MED ORDER — MECLIZINE HCL 25 MG PO TABS: 25.0000 mg | ORAL_TABLET | Freq: Three times a day (TID) | ORAL | 0 refills | Status: DC | PRN

## 2016-12-06 MED ORDER — MECLIZINE HCL 12.5 MG PO TABS
25.0000 mg | ORAL_TABLET | Freq: Once | ORAL | Status: AC
  Administered 2016-12-06: 25 mg via ORAL
  Filled 2016-12-06: qty 2

## 2016-12-06 MED ORDER — POTASSIUM CHLORIDE CRYS ER 20 MEQ PO TBCR
40.0000 meq | EXTENDED_RELEASE_TABLET | Freq: Once | ORAL | Status: AC
  Administered 2016-12-06: 40 meq via ORAL
  Filled 2016-12-06: qty 2

## 2016-12-06 NOTE — Discharge Instructions (Signed)
Your potassium was slightly low. This is most likely a result from your hydrochlorothiazide. Recommend eating potassium rich foods. You can Google this information. Prescription for medication for dizziness. Your Tegretol level was low.

## 2016-12-06 NOTE — ED Triage Notes (Signed)
Dizziness when pt stands.  Pt reports she has had this happen before and her bp was elevated.  Pt states she has been under a lot of stress lately.  Denies pain with exception to her chronic back pain

## 2016-12-06 NOTE — ED Provider Notes (Signed)
[place] [facility] DEPT Provider Note   CSN: [id number] Arrival date & time: 12/06/16  1517     History   Chief Complaint No chief complaint on file.   HPI Deborah Barnes is a 41 y.o. female.  Patient reports lightheadedness upon standing. She has blamed this in the past on her blood pressure. She takes atenolol and hydrochlorothiazide. Review systems positive for headache. No frank neurological deficits, stiff neck, confusion, altered mental status, chest pain, dyspnea. Patient reports excessive stress recently.      Past Medical History:  Diagnosis Date  . Asthma   . Back pain   . Bipolar 1 disorder ([place])   . Bulging disc   . COPD (chronic obstructive pulmonary disease) ([place])   . Degenerative disc disease   . Depression with anxiety   . GERD (gastroesophageal reflux disease)   . Hypercholesterolemia   . Hypertension   . PTSD (post-traumatic stress disorder)   . Sciatica   . Shortness of breath     Patient Active Problem List   Diagnosis Date Noted  . Chronic midline low back pain with right-sided sciatica 08/10/2016  . Palpitations 08/10/2016  . Edema 08/10/2016  . Irritable bowel syndrome with diarrhea 08/10/2016  . Bipolar I disorder ([place]) 08/10/2016  . Gastroesophageal reflux disease without esophagitis 08/10/2016  . Mild intermittent asthma 08/10/2016  . Diverticulosis of colon without hemorrhage   . History of colonic polyps   . Chronic diarrhea of unknown origin   . Mucosal abnormality of stomach   . Loose stools 08/27/2014  . Abdominal pain, chronic, epigastric 08/27/2014  . Anal fissure 12/24/2013    Past Surgical History:  Procedure Laterality Date  . BIOPSY N/A 09/25/2014   Procedure: BIOPSY;  Surgeon: Daneil Dolin, MD;  Location: [facility];  Service: Endoscopy;  Laterality: N/A;  Gastric, Ascending Colon, Descending/Sigmoid Colon   . CARPAL TUNNEL RELEASE Bilateral   . CHOLECYSTECTOMY N/A 04/26/2013   Procedure: LAPAROSCOPIC CHOLECYSTECTOMY;   Surgeon: Jamesetta So, MD;  Location: [facility];  Service: General;  Laterality: N/A;  . COLONOSCOPY WITH PROPOFOL N/A 09/25/2014   RMR: Pancolonic diverticulosis. Multiple colonic polyps removed as described above. Status post segmental bisopsy.  . ESOPHAGOGASTRODUODENOSCOPY (EGD) WITH PROPOFOL N/A 09/25/2014   RMR: Normal esophagus. Abnormal gastric mucosa status post biopsy. Hiatal hernia.   Fatima Blank HERNIA REPAIR N/A 10/04/2013   Procedure: HERNIA REPAIR INCISIONAL WITH MESH;  Surgeon: Jamesetta So, MD;  Location: [facility];  Service: General;  Laterality: N/A;  . INSERTION OF MESH N/A 10/04/2013   Procedure: INSERTION OF MESH;  Surgeon: Jamesetta So, MD;  Location: [facility];  Service: General;  Laterality: N/A;  . POLYPECTOMY N/A 09/25/2014   Procedure: POLYPECTOMY;  Surgeon: Daneil Dolin, MD;  Location: [facility];  Service: Endoscopy;  Laterality: N/A;  Cecal, Ascending Colon   . SHOULDER SURGERY    . TUBAL LIGATION      OB History    Gravida Para Term Preterm AB Living   11 2 2   9 2    SAB TAB Ectopic Multiple Live Births   9               Home Medications    Prior to Admission medications   Medication Sig Start Date End Date Taking? Authorizing Provider  albuterol (PROVENTIL HFA;VENTOLIN HFA) 108 (90 BASE) MCG/ACT inhaler Inhale 1-2 puffs into the lungs every 6 (six) hours as needed for wheezing or shortness of breath.    Yes  Historical Provider, MD  atenolol (TENORMIN) 25 MG tablet TAKE ONE (1) TABLET EACH DAY 09/09/16  Yes Terald Sleeper, PA-C  beclomethasone (QVAR) 80 MCG/ACT inhaler Inhale 2 puffs into the lungs 2 (two) times daily.   Yes Historical Provider, MD  busPIRone (BUSPAR) 10 MG tablet Take 10 mg by mouth 3 (three) times daily.   Yes Historical Provider, MD  carbamazepine (TEGRETOL) 200 MG tablet Take 200 mg by mouth 2 (two) times daily.   Yes Historical Provider, MD  Cholecalciferol (VITAMIN D3) 5000 units CAPS Take 5,000 Units by mouth daily.   Yes Historical Provider,  MD  cyclobenzaprine (FLEXERIL) 10 MG tablet TAKE ONE (1) TABLET THREE (3) TIMES EACH DAY Patient taking differently: TAKE 1 TABLET BY MOUTH THREE TIMES DAILY AS NEEDED FOR MUSCLE SPASMS 11/14/16  Yes Terald Sleeper, PA-C  diazepam (VALIUM) 2 MG tablet Take 2 mg by mouth at bedtime.    Yes Historical Provider, MD  escitalopram (LEXAPRO) 20 MG tablet Take 20 mg by mouth daily.    Yes Historical Provider, MD  gabapentin (NEURONTIN) 300 MG capsule TAKE TWO CAPSULES BY MOUTH TWICE DAILY. 10/27/16  Yes Terald Sleeper, PA-C  gemfibrozil (LOPID) 600 MG tablet TAKE ONE TABLET BY MOUTH TWICE DAILY 09/09/16  Yes Terald Sleeper, PA-C  hydrochlorothiazide (HYDRODIURIL) 25 MG tablet TAKE ONE (1) TABLET EACH DAY 05/06/16  Yes Terald Sleeper, PA-C  lamoTRIgine (LAMICTAL) 100 MG tablet Take 200 mg by mouth at bedtime. For BIPOLAR    Yes Historical Provider, MD  loratadine (CLARITIN) 10 MG tablet TAKE ONE (1) TABLET EACH DAY 11/14/16  Yes Terald Sleeper, PA-C  olopatadine (PATANOL) 0.1 % ophthalmic solution Place 1 drop into both eyes 2 (two) times daily.   Yes Historical Provider, MD  Oxycodone HCl 10 MG TABS Take 1-2 tablets (10-20 mg total) by mouth 3 (three) times daily. Use for severe pain Patient taking differently: Take 10-20 mg by mouth 3 (three) times daily as needed (for severe pain).  08/10/16  Yes Terald Sleeper, PA-C  pantoprazole (PROTONIX) 40 MG tablet TAKE ONE (1) TABLET EACH DAY 10/07/16  Yes Terald Sleeper, PA-C  promethazine (PHENERGAN) 25 MG tablet Take 25 mg by mouth every 6 (six) hours as needed for nausea or vomiting.   Yes Historical Provider, MD  triamcinolone cream (KENALOG) 0.1 % Apply 1 application topically 2 (two) times daily as needed (for itching/irritation).    Yes Historical Provider, MD  meclizine (ANTIVERT) 25 MG tablet Take 1 tablet (25 mg total) by mouth 3 (three) times daily as needed for dizziness. 12/06/16   Nat Christen, MD    Family History Family History  Problem Relation Age of Onset    . Diabetes Father   . Colon cancer Maternal Grandmother     Social History Social History  Substance Use Topics  . Smoking status: Current Every Day Smoker    Packs/day: 1.00    Years: 20.00    Types: Cigarettes  . Smokeless tobacco: Never Used  . Alcohol use No     Comment: ETOH abuse in past, quit a few years ago.      Allergies   Clarithromycin and Darvocet [propoxyphene n-acetaminophen]   Review of Systems Review of Systems  All other systems reviewed and are negative.    Physical Exam Updated Vital Signs BP 105/71 (BP Location: Right Arm)   Pulse 70   Temp 98.1 F (36.7 C) (Oral)   Resp 16   Ht  5\' 5"  (1.651 m)   Wt 219 lb (99.3 kg)   LMP 11/08/2016   SpO2 100%   BMI 36.44 kg/m   Physical Exam  Constitutional: She is oriented to person, place, and time. She appears well-developed and well-nourished.  HENT:  Head: Normocephalic and atraumatic.  Eyes: Conjunctivae are normal.  Neck: Neck supple.  Cardiovascular: Normal rate and regular rhythm.   Pulmonary/Chest: Effort normal and breath sounds normal.  Abdominal: Soft. Bowel sounds are normal.  Musculoskeletal: Normal range of motion.  Neurological: She is alert and oriented to person, place, and time.  Skin: Skin is warm and dry.  Psychiatric: She has a normal mood and affect. Her behavior is normal.  Nursing note and vitals reviewed.    ED Treatments / Results  Labs (all labs ordered are listed, but only abnormal results are displayed) Labs Reviewed  CBC WITH DIFFERENTIAL/PLATELET - Abnormal; Notable for the following:       Result Value   WBC 10.8 (*)    All other components within normal limits  BASIC METABOLIC PANEL - Abnormal; Notable for the following:    Potassium 3.0 (*)    Chloride 99 (*)    All other components within normal limits  CARBAMAZEPINE LEVEL, TOTAL - Abnormal; Notable for the following:    Carbamazepine Lvl 3.8 (*)    All other components within normal limits     EKG  EKG Interpretation None       Radiology No results found.  Procedures Procedures (including critical care time)  Medications Ordered in ED Medications  potassium chloride SA (K-DUR,KLOR-CON) CR tablet 40 mEq (not administered)  meclizine (ANTIVERT) tablet 25 mg (25 mg Oral Given 12/06/16 1937)     Initial Impression / Assessment and Plan / ED Course  I have reviewed the triage vital signs and the nursing notes.  Pertinent labs & imaging results that were available during my care of the patient were reviewed by me and considered in my medical decision making (see chart for details).     Patient has normal exam. Potassium slightly low. Carbamazepine level subtherapeutic. This was discussed with the patient. Discharge medications Antivert 20 mg. She has primary care follow-up.  Final Clinical Impressions(s) / ED Diagnoses   Final diagnoses:  Vertigo    New Prescriptions New Prescriptions   MECLIZINE (ANTIVERT) 25 MG TABLET    Take 1 tablet (25 mg total) by mouth 3 (three) times daily as needed for dizziness.     Nat Christen, MD 12/06/16 2120

## 2016-12-09 ENCOUNTER — Other Ambulatory Visit: Payer: Self-pay | Admitting: Physician Assistant

## 2016-12-12 ENCOUNTER — Other Ambulatory Visit: Payer: Self-pay | Admitting: Physician Assistant

## 2017-01-12 ENCOUNTER — Other Ambulatory Visit: Payer: Self-pay | Admitting: Physician Assistant

## 2017-01-28 ENCOUNTER — Encounter (HOSPITAL_COMMUNITY): Payer: Self-pay | Admitting: *Deleted

## 2017-01-28 ENCOUNTER — Emergency Department (HOSPITAL_COMMUNITY): Payer: Medicaid Other

## 2017-01-28 ENCOUNTER — Emergency Department (HOSPITAL_COMMUNITY)
Admission: EM | Admit: 2017-01-28 | Discharge: 2017-01-28 | Disposition: A | Discharge status: Home / Self Care | Payer: Medicaid Other | Attending: Emergency Medicine | Admitting: Emergency Medicine

## 2017-01-28 DIAGNOSIS — E876 Hypokalemia: Secondary | ICD-10-CM | POA: Insufficient documentation

## 2017-01-28 DIAGNOSIS — J4520 Mild intermittent asthma, uncomplicated: Secondary | ICD-10-CM | POA: Diagnosis not present

## 2017-01-28 DIAGNOSIS — N10 Acute tubulo-interstitial nephritis: Secondary | ICD-10-CM | POA: Diagnosis not present

## 2017-01-28 DIAGNOSIS — I10 Essential (primary) hypertension: Secondary | ICD-10-CM | POA: Diagnosis not present

## 2017-01-28 DIAGNOSIS — J449 Chronic obstructive pulmonary disease, unspecified: Secondary | ICD-10-CM | POA: Insufficient documentation

## 2017-01-28 DIAGNOSIS — Z79899 Other long term (current) drug therapy: Secondary | ICD-10-CM | POA: Diagnosis not present

## 2017-01-28 DIAGNOSIS — F17210 Nicotine dependence, cigarettes, uncomplicated: Secondary | ICD-10-CM | POA: Insufficient documentation

## 2017-01-28 DIAGNOSIS — R1031 Right lower quadrant pain: Secondary | ICD-10-CM | POA: Diagnosis present

## 2017-01-28 LAB — URINALYSIS, ROUTINE W REFLEX MICROSCOPIC
BILIRUBIN URINE: NEGATIVE
Glucose, UA: NEGATIVE mg/dL
Ketones, ur: NEGATIVE mg/dL
NITRITE: POSITIVE — AB
PH: 5 (ref 5.0–8.0)
Protein, ur: NEGATIVE mg/dL
SPECIFIC GRAVITY, URINE: 1.017 (ref 1.005–1.030)

## 2017-01-28 LAB — CBC WITH DIFFERENTIAL/PLATELET
Basophils Absolute: 0 10*3/uL (ref 0.0–0.1)
Basophils Relative: 0 %
EOS ABS: 0.3 10*3/uL (ref 0.0–0.7)
EOS PCT: 3 %
HCT: 35.6 % — ABNORMAL LOW (ref 36.0–46.0)
Hemoglobin: 12.3 g/dL (ref 12.0–15.0)
LYMPHS ABS: 2.1 10*3/uL (ref 0.7–4.0)
Lymphocytes Relative: 21 %
MCH: 32.9 pg (ref 26.0–34.0)
MCHC: 34.6 g/dL (ref 30.0–36.0)
MCV: 95.2 fL (ref 78.0–100.0)
MONO ABS: 0.8 10*3/uL (ref 0.1–1.0)
MONOS PCT: 8 %
Neutro Abs: 7 10*3/uL (ref 1.7–7.7)
Neutrophils Relative %: 68 %
PLATELETS: 279 10*3/uL (ref 150–400)
RBC: 3.74 MIL/uL — ABNORMAL LOW (ref 3.87–5.11)
RDW: 13.6 % (ref 11.5–15.5)
WBC: 10.1 10*3/uL (ref 4.0–10.5)

## 2017-01-28 LAB — COMPREHENSIVE METABOLIC PANEL
ALK PHOS: 70 U/L (ref 38–126)
ALT: 21 U/L (ref 14–54)
AST: 21 U/L (ref 15–41)
Albumin: 3.6 g/dL (ref 3.5–5.0)
Anion gap: 7 (ref 5–15)
BUN: 14 mg/dL (ref 6–20)
CALCIUM: 8.8 mg/dL — AB (ref 8.9–10.3)
CO2: 29 mmol/L (ref 22–32)
CREATININE: 0.62 mg/dL (ref 0.44–1.00)
Chloride: 103 mmol/L (ref 101–111)
GFR calc non Af Amer: >60 mL/min (ref >60)
GLUCOSE: 114 mg/dL — AB (ref 65–99)
Potassium: 3.2 mmol/L — ABNORMAL LOW (ref 3.5–5.1)
SODIUM: 139 mmol/L (ref 135–145)
Total Bilirubin: 0.3 mg/dL (ref 0.3–1.2)
Total Protein: 6.3 g/dL — ABNORMAL LOW (ref 6.5–8.1)

## 2017-01-28 LAB — LIPASE, BLOOD: Lipase: 24 U/L (ref 11–51)

## 2017-01-28 LAB — POC URINE PREG, ED: Preg Test, Ur: NEGATIVE

## 2017-01-28 MED ORDER — ONDANSETRON HCL 4 MG/2ML IJ SOLN
4.0000 mg | Freq: Once | INTRAMUSCULAR | Status: AC
  Administered 2017-01-28: 4 mg via INTRAVENOUS
  Filled 2017-01-28: qty 2

## 2017-01-28 MED ORDER — MORPHINE SULFATE (PF) 4 MG/ML IV SOLN
4.0000 mg | Freq: Once | INTRAVENOUS | Status: AC
  Administered 2017-01-28: 4 mg via INTRAVENOUS
  Filled 2017-01-28: qty 1

## 2017-01-28 MED ORDER — CEPHALEXIN 500 MG PO CAPS: 500.0000 mg | ORAL_CAPSULE | Freq: Four times a day (QID) | ORAL | 0 refills | Status: DC

## 2017-01-28 MED ORDER — OXYCODONE-ACETAMINOPHEN 5-325 MG PO TABS: 1.0000 | ORAL_TABLET | ORAL | 0 refills | Status: DC | PRN

## 2017-01-28 MED ORDER — POTASSIUM CHLORIDE CRYS ER 20 MEQ PO TBCR
40.0000 meq | EXTENDED_RELEASE_TABLET | Freq: Once | ORAL | Status: AC
  Administered 2017-01-28: 40 meq via ORAL
  Filled 2017-01-28: qty 2

## 2017-01-28 MED ORDER — DEXTROSE 5 % IV SOLN
1.0000 g | Freq: Once | INTRAVENOUS | Status: AC
  Administered 2017-01-28: 1 g via INTRAVENOUS
  Filled 2017-01-28: qty 10

## 2017-01-28 MED ORDER — POTASSIUM CHLORIDE CRYS ER 20 MEQ PO TBCR: 20.0000 meq | EXTENDED_RELEASE_TABLET | Freq: Two times a day (BID) | ORAL | 0 refills | Status: DC

## 2017-01-28 NOTE — Discharge Instructions (Signed)
Return if pain is not being adequately controlled at home, he started vomiting and are unable to hold her medication down, or if you start running a high fever.  Your potassium level was low today. This is because of a medication you're taking (hydrochlorothiazide). You're being given a prescription for potassium to pull that up. However, your primary care provider will need to monitor your potassium level and adjust your medications as needed.

## 2017-01-28 NOTE — ED Notes (Signed)
Pt ambulated to bathroom with a steady gait - continues to complain of right sided flank pain but has no complaints of nausea

## 2017-01-28 NOTE — ED Triage Notes (Signed)
Pt reports right sided flank pain and nausea since 10 pm. Pt states she had dysuria 2 days ago but it went away.

## 2017-01-28 NOTE — ED Notes (Signed)
Pt alert and oriented x 4. Stable gait. Pt given discharge papers/prescriptions. Pt told to stop by registration to complete any additional paperwork. Pt left the department with no further questions. 

## 2017-01-28 NOTE — ED Provider Notes (Signed)
[facility] DEPT Provider Note   CSN: [id number] Arrival date & time: 01/28/17  0326     History   Chief Complaint Chief Complaint  Patient presents with  . Flank Pain    HPI Deborah Barnes is a 41 y.o. female.  She had onset about 3 hours ago of severe right flank pain which is now radiating to the right lower abdomen. Pain waxes and wanes without particular pattern. There is associated nausea but no vomiting. She denies fever, chills, sweats. She denies dysuria or urinary urgency or hesitancy. She's never had pain like this before. Pain is rated at 9/10. She did take ibuprofen with no relief. Nothing makes it better nothing makes it worse.   The history is provided by the patient.  Flank Pain     Past Medical History:  Diagnosis Date  . Asthma   . Back pain   . Bipolar 1 disorder ([place])   . Bulging disc   . COPD (chronic obstructive pulmonary disease) ([place])   . Degenerative disc disease   . Depression with anxiety   . GERD (gastroesophageal reflux disease)   . Hypercholesterolemia   . Hypertension   . PTSD (post-traumatic stress disorder)   . Sciatica   . Shortness of breath     Patient Active Problem List   Diagnosis Date Noted  . Chronic midline low back pain with right-sided sciatica 08/10/2016  . Palpitations 08/10/2016  . Edema 08/10/2016  . Irritable bowel syndrome with diarrhea 08/10/2016  . Bipolar I disorder ([place]) 08/10/2016  . Gastroesophageal reflux disease without esophagitis 08/10/2016  . Mild intermittent asthma 08/10/2016  . Diverticulosis of colon without hemorrhage   . History of colonic polyps   . Chronic diarrhea of unknown origin   . Mucosal abnormality of stomach   . Loose stools 08/27/2014  . Abdominal pain, chronic, epigastric 08/27/2014  . Anal fissure 12/24/2013    Past Surgical History:  Procedure Laterality Date  . BIOPSY N/A 09/25/2014   Procedure: BIOPSY;  Surgeon: Daneil Dolin, MD;  Location: [facility];  Service:  Endoscopy;  Laterality: N/A;  Gastric, Ascending Colon, Descending/Sigmoid Colon   . CARPAL TUNNEL RELEASE Bilateral   . CHOLECYSTECTOMY N/A 04/26/2013   Procedure: LAPAROSCOPIC CHOLECYSTECTOMY;  Surgeon: Jamesetta So, MD;  Location: [facility];  Service: General;  Laterality: N/A;  . COLONOSCOPY WITH PROPOFOL N/A 09/25/2014   RMR: Pancolonic diverticulosis. Multiple colonic polyps removed as described above. Status post segmental bisopsy.  . ESOPHAGOGASTRODUODENOSCOPY (EGD) WITH PROPOFOL N/A 09/25/2014   RMR: Normal esophagus. Abnormal gastric mucosa status post biopsy. Hiatal hernia.   Fatima Blank HERNIA REPAIR N/A 10/04/2013   Procedure: HERNIA REPAIR INCISIONAL WITH MESH;  Surgeon: Jamesetta So, MD;  Location: [facility];  Service: General;  Laterality: N/A;  . INSERTION OF MESH N/A 10/04/2013   Procedure: INSERTION OF MESH;  Surgeon: Jamesetta So, MD;  Location: [facility];  Service: General;  Laterality: N/A;  . POLYPECTOMY N/A 09/25/2014   Procedure: POLYPECTOMY;  Surgeon: Daneil Dolin, MD;  Location: [facility];  Service: Endoscopy;  Laterality: N/A;  Cecal, Ascending Colon   . SHOULDER SURGERY    . TUBAL LIGATION      OB History    Gravida Para Term Preterm AB Living   11 2 2   9 2    SAB TAB Ectopic Multiple Live Births   9               Home Medications  Prior to Admission medications   Medication Sig Start Date End Date Taking? Authorizing Provider  albuterol (PROVENTIL HFA;VENTOLIN HFA) 108 (90 BASE) MCG/ACT inhaler Inhale 1-2 puffs into the lungs every 6 (six) hours as needed for wheezing or shortness of breath.     [provider]  atenolol (TENORMIN) 25 MG tablet TAKE ONE (1) TABLET EACH DAY 09/09/16   Terald Sleeper, PA-C  beclomethasone (QVAR) 80 MCG/ACT inhaler Inhale 2 puffs into the lungs 2 (two) times daily.    [provider]  busPIRone (BUSPAR) 10 MG tablet Take 10 mg by mouth 3 (three) times daily.    [provider]  carbamazepine (TEGRETOL) 200  MG tablet Take 200 mg by mouth 2 (two) times daily.    [provider]  Cholecalciferol (VITAMIN D3) 5000 units CAPS Take 5,000 Units by mouth daily.    [provider]  cyclobenzaprine (FLEXERIL) 10 MG tablet TAKE ONE (1) TABLET THREE (3) TIMES EACH DAY Patient taking differently: TAKE 1 TABLET BY MOUTH THREE TIMES DAILY AS NEEDED FOR MUSCLE SPASMS 11/14/16   Terald Sleeper, PA-C  diazepam (VALIUM) 2 MG tablet Take 2 mg by mouth at bedtime.     [provider]  escitalopram (LEXAPRO) 20 MG tablet Take 20 mg by mouth daily.     [provider]  gabapentin (NEURONTIN) 300 MG capsule TAKE TWO CAPSULES BY MOUTH TWICE DAILY. 12/12/16   Terald Sleeper, PA-C  gemfibrozil (LOPID) 600 MG tablet TAKE ONE TABLET BY MOUTH TWICE DAILY 09/09/16   Terald Sleeper, PA-C  hydrochlorothiazide (HYDRODIURIL) 25 MG tablet TAKE ONE (1) TABLET EACH DAY 05/06/16   Terald Sleeper, PA-C  lamoTRIgine (LAMICTAL) 100 MG tablet Take 200 mg by mouth at bedtime. For BIPOLAR     [provider]  loratadine (CLARITIN) 10 MG tablet TAKE ONE (1) TABLET EACH DAY 11/14/16   Terald Sleeper, PA-C  meclizine (ANTIVERT) 25 MG tablet Take 1 tablet (25 mg total) by mouth 3 (three) times daily as needed for dizziness. 12/06/16   Nat Christen, MD  olopatadine (PATANOL) 0.1 % ophthalmic solution Place 1 drop into both eyes 2 (two) times daily.    [provider]  Oxycodone HCl 10 MG TABS Take 1-2 tablets (10-20 mg total) by mouth 3 (three) times daily. Use for severe pain Patient taking differently: Take 10-20 mg by mouth 3 (three) times daily as needed (for severe pain).  08/10/16   Terald Sleeper, PA-C  pantoprazole (PROTONIX) 40 MG tablet TAKE ONE (1) TABLET EACH DAY 01/12/17   Terald Sleeper, PA-C  promethazine (PHENERGAN) 25 MG tablet Take 25 mg by mouth every 6 (six) hours as needed for nausea or vomiting.    [provider]  triamcinolone cream (KENALOG) 0.1 % Apply 1 application  topically 2 (two) times daily as needed (for itching/irritation).     [provider]    Family History Family History  Problem Relation Age of Onset  . Diabetes Father   . Colon cancer Maternal Grandmother     Social History Social History  Substance Use Topics  . Smoking status: Current Every Day Smoker    Packs/day: 1.00    Years: 20.00    Types: Cigarettes  . Smokeless tobacco: Never Used  . Alcohol use No     Comment: ETOH abuse in past, quit a few years ago.      Allergies   Clarithromycin and Darvocet [propoxyphene n-acetaminophen]   Review  of Systems Review of Systems  Genitourinary: Positive for flank pain.  All other systems reviewed and are negative.    Physical Exam Updated Vital Signs BP 122/68 (BP Location: Right Arm)   Pulse 82   Temp 98.1 F (36.7 C) (Oral)   Resp 19   Ht 5\' 5"  (1.651 m)   Wt 216 lb (98 kg)   LMP 01/06/2017   SpO2 99%   BMI 35.94 kg/m   Physical Exam  Nursing note and vitals reviewed.  40 year old female, in obvious pain, but in no acute distress. Vital signs are normal. Oxygen saturation is 99%, which is normal. Head is normocephalic and atraumatic. PERRLA, EOMI. Oropharynx is clear. Neck is nontender and supple without adenopathy or JVD. Back is nontender in the midline. There is severe right CVA tenderness. Lungs are clear without rales, wheezes, or rhonchi. Chest is nontender. Heart has regular rate and rhythm without murmur. Abdomen is soft, flat, with mild right lower and mid abdominal tenderness. There is no rebound or guarding. There are no masses or hepatosplenomegaly and peristalsis is hypoactive. Extremities have no cyanosis or edema, full range of motion is present. Skin is warm and dry without rash. Neurologic: Mental status is normal, cranial nerves are intact, there are no motor or sensory deficits.  ED Treatments / Results  Labs (all labs ordered are listed, but only abnormal results are  displayed) Labs Reviewed  URINALYSIS, ROUTINE W REFLEX MICROSCOPIC - Abnormal; Notable for the following:       Result Value   APPearance HAZY (*)    Hgb urine dipstick SMALL (*)    Nitrite POSITIVE (*)    Leukocytes, UA MODERATE (*)    Bacteria, UA RARE (*)    Squamous Epithelial / LPF 6-30 (*)    All other components within normal limits  COMPREHENSIVE METABOLIC PANEL - Abnormal; Notable for the following:    Potassium 3.2 (*)    Glucose, Bld 114 (*)    Calcium 8.8 (*)    Total Protein 6.3 (*)    All other components within normal limits  CBC WITH DIFFERENTIAL/PLATELET - Abnormal; Notable for the following:    RBC 3.74 (*)    HCT 35.6 (*)    All other components within normal limits  URINE CULTURE  LIPASE, BLOOD  POC URINE PREG, ED    Radiology Ct Renal Stone Study  Result Date: 01/28/2017 CLINICAL DATA:  Right flank pain and nausea since 10 p.m. Dysuria 2 days ago. EXAM: CT ABDOMEN AND PELVIS WITHOUT CONTRAST TECHNIQUE: Multidetector CT imaging of the abdomen and pelvis was performed following the standard protocol without IV contrast. COMPARISON:  01/20/2014 FINDINGS: Lower chest: The lung bases are clear. Hepatobiliary: No focal liver abnormality is seen. Status post cholecystectomy. No biliary dilatation. Pancreas: Unremarkable. No pancreatic ductal dilatation or surrounding inflammatory changes. Spleen: Normal in size without focal abnormality. Adrenals/Urinary Tract: Adrenal glands are unremarkable. Kidneys are normal, without renal calculi, focal lesion, or hydronephrosis. Bladder is unremarkable. Stomach/Bowel: Stomach is within normal limits. Appendix appears normal. No evidence of bowel wall thickening, distention, or inflammatory changes. Vascular/Lymphatic: No significant vascular findings are present. No enlarged abdominal or pelvic lymph nodes. Reproductive: Uterus and bilateral adnexa are unremarkable. Other: Small sub xiphoid hernia containing fat. No free air or free  fluid in the abdomen. Musculoskeletal: Degenerative changes in the spine. No destructive bone lesions. IMPRESSION: No renal or ureteral stone or obstruction. No acute process demonstrated in the abdomen or pelvis. Electronically Signed  By: Lucienne Capers M.D.   On: 01/28/2017 04:46    Procedures Procedures (including critical care time)  Medications Ordered in ED Medications  ondansetron (ZOFRAN) injection 4 mg (4 mg Intravenous Given 01/28/17 0418)  morphine 4 MG/ML injection 4 mg (4 mg Intravenous Given 01/28/17 0418)  cefTRIAXone (ROCEPHIN) 1 g in dextrose 5 % 50 mL IVPB (0 g Intravenous Stopped 01/28/17 0544)  potassium chloride SA (K-DUR,KLOR-CON) CR tablet 40 mEq (40 mEq Oral Given 01/28/17 0544)     Initial Impression / Assessment and Plan / ED Course  I have reviewed the triage vital signs and the nursing notes.  Pertinent labs & imaging results that were available during my care of the patient were reviewed by me and considered in my medical decision making (see chart for details).  Right flank pain in pattern strongly suggestive of ureteral colic. Old records reviewed, and I found a CT of abdomen and pelvis in May 2015 where I believe I see a punctate right renal calculus. She is already taking an NSAID, so is not given ketorolac. She is given morphine and ondansetron and will be sent for renal stone protocol CT scan.  CT scan shows no evidence of nephrolithiasis or ureterolithiasis. Urinalysis does have positive nitrite suggesting urinary tract infection. Specimen is been sent for culture. She is given a dose of ceftriaxone in the ED. She got good pain relief with morphine. Since she is not vomiting and pain is adequately controlled, she is felt to be a good candidate to go home with antibiotics for what appears to be acute pyelonephritis. Incidental finding of hypokalemia is noted and she is given oral potassium. She is on a diuretic and may need ongoing potassium supplementation.  She is discharged with prescriptions for cephalexin and [place]. She is also given a to go pack of oxycodone-acetaminophen to make sure she has adequate pain control for the first 24 hours until her infection his blood under control. Return precautions discussed.  Final Clinical Impressions(s) / ED Diagnoses   Final diagnoses:  Pyelonephritis, acute  Hypokalemia    New Prescriptions New Prescriptions   CEPHALEXIN (KEFLEX) 500 MG CAPSULE    Take 1 capsule (500 mg total) by mouth 4 (four) times daily.   OXYCODONE-ACETAMINOPHEN (PERCOCET) 5-325 MG TABLET    Take 1 tablet by mouth every 4 (four) hours as needed for moderate pain.   POTASSIUM CHLORIDE SA (K-DUR,KLOR-CON) 20 MEQ TABLET    Take 1 tablet (20 mEq total) by mouth 2 (two) times daily.     Delora Fuel, MD 92/44/62 [phone]

## 2017-01-29 ENCOUNTER — Emergency Department (HOSPITAL_COMMUNITY)
Admission: EM | Admit: 2017-01-29 | Discharge: 2017-01-29 | Disposition: A | Discharge status: Home / Self Care | Payer: Medicaid Other | Attending: Emergency Medicine | Admitting: Emergency Medicine

## 2017-01-29 ENCOUNTER — Encounter (HOSPITAL_COMMUNITY): Payer: Self-pay

## 2017-01-29 DIAGNOSIS — F17210 Nicotine dependence, cigarettes, uncomplicated: Secondary | ICD-10-CM | POA: Insufficient documentation

## 2017-01-29 DIAGNOSIS — J45909 Unspecified asthma, uncomplicated: Secondary | ICD-10-CM | POA: Diagnosis not present

## 2017-01-29 DIAGNOSIS — J449 Chronic obstructive pulmonary disease, unspecified: Secondary | ICD-10-CM | POA: Diagnosis not present

## 2017-01-29 DIAGNOSIS — I10 Essential (primary) hypertension: Secondary | ICD-10-CM | POA: Insufficient documentation

## 2017-01-29 DIAGNOSIS — R109 Unspecified abdominal pain: Secondary | ICD-10-CM | POA: Diagnosis present

## 2017-01-29 DIAGNOSIS — E876 Hypokalemia: Secondary | ICD-10-CM | POA: Diagnosis not present

## 2017-01-29 DIAGNOSIS — N12 Tubulo-interstitial nephritis, not specified as acute or chronic: Secondary | ICD-10-CM | POA: Diagnosis not present

## 2017-01-29 DIAGNOSIS — Z79899 Other long term (current) drug therapy: Secondary | ICD-10-CM | POA: Diagnosis not present

## 2017-01-29 LAB — COMPREHENSIVE METABOLIC PANEL
ALT: 35 U/L (ref 14–54)
ANION GAP: 9 (ref 5–15)
AST: 34 U/L (ref 15–41)
Albumin: 4 g/dL (ref 3.5–5.0)
Alkaline Phosphatase: 66 U/L (ref 38–126)
BUN: 9 mg/dL (ref 6–20)
CHLORIDE: 105 mmol/L (ref 101–111)
CO2: 25 mmol/L (ref 22–32)
Calcium: 9.3 mg/dL (ref 8.9–10.3)
Creatinine, Ser: 0.55 mg/dL (ref 0.44–1.00)
Glucose, Bld: 119 mg/dL — ABNORMAL HIGH (ref 65–99)
POTASSIUM: 3 mmol/L — AB (ref 3.5–5.1)
SODIUM: 139 mmol/L (ref 135–145)
Total Bilirubin: 0.4 mg/dL (ref 0.3–1.2)
Total Protein: 7.3 g/dL (ref 6.5–8.1)

## 2017-01-29 LAB — CBC WITH DIFFERENTIAL/PLATELET
Basophils Absolute: 0 10*3/uL (ref 0.0–0.1)
Basophils Relative: 0 %
EOS ABS: 0 10*3/uL (ref 0.0–0.7)
EOS PCT: 0 %
HCT: 39.5 % (ref 36.0–46.0)
Hemoglobin: 13.5 g/dL (ref 12.0–15.0)
LYMPHS ABS: 1.2 10*3/uL (ref 0.7–4.0)
Lymphocytes Relative: 15 %
MCH: 32.3 pg (ref 26.0–34.0)
MCHC: 34.2 g/dL (ref 30.0–36.0)
MCV: 94.5 fL (ref 78.0–100.0)
MONO ABS: 0.5 10*3/uL (ref 0.1–1.0)
Monocytes Relative: 6 %
Neutro Abs: 6.7 10*3/uL (ref 1.7–7.7)
Neutrophils Relative %: 79 %
PLATELETS: 320 10*3/uL (ref 150–400)
RBC: 4.18 MIL/uL (ref 3.87–5.11)
RDW: 13.4 % (ref 11.5–15.5)
WBC: 8.4 10*3/uL (ref 4.0–10.5)

## 2017-01-29 LAB — URINALYSIS, ROUTINE W REFLEX MICROSCOPIC
BILIRUBIN URINE: NEGATIVE
GLUCOSE, UA: NEGATIVE mg/dL
HGB URINE DIPSTICK: NEGATIVE
Ketones, ur: NEGATIVE mg/dL
LEUKOCYTES UA: NEGATIVE
NITRITE: NEGATIVE
PROTEIN: 100 mg/dL — AB
Specific Gravity, Urine: 1.023 (ref 1.005–1.030)
pH: 8 (ref 5.0–8.0)

## 2017-01-29 LAB — LACTIC ACID, PLASMA: LACTIC ACID, VENOUS: 1.8 mmol/L (ref 0.5–1.9)

## 2017-01-29 MED ORDER — IBUPROFEN 600 MG PO TABS: 600.0000 mg | ORAL_TABLET | Freq: Four times a day (QID) | ORAL | 0 refills | Status: DC | PRN

## 2017-01-29 MED ORDER — ONDANSETRON HCL 4 MG/2ML IJ SOLN
4.0000 mg | Freq: Once | INTRAMUSCULAR | Status: AC
  Administered 2017-01-29: 4 mg via INTRAVENOUS
  Filled 2017-01-29: qty 2

## 2017-01-29 MED ORDER — POTASSIUM CHLORIDE CRYS ER 20 MEQ PO TBCR
40.0000 meq | EXTENDED_RELEASE_TABLET | Freq: Once | ORAL | Status: AC
  Administered 2017-01-29: 40 meq via ORAL
  Filled 2017-01-29: qty 2

## 2017-01-29 MED ORDER — PROMETHAZINE HCL 25 MG PO TABS: 25.0000 mg | ORAL_TABLET | Freq: Four times a day (QID) | ORAL | 0 refills | Status: DC | PRN

## 2017-01-29 MED ORDER — KETOROLAC TROMETHAMINE 30 MG/ML IJ SOLN
30.0000 mg | Freq: Once | INTRAMUSCULAR | Status: AC
  Administered 2017-01-29: 30 mg via INTRAVENOUS
  Filled 2017-01-29: qty 1

## 2017-01-29 MED ORDER — SODIUM CHLORIDE 0.9 % IV BOLUS (SEPSIS)
1000.0000 mL | Freq: Once | INTRAVENOUS | Status: AC
  Administered 2017-01-29: 1000 mL via INTRAVENOUS

## 2017-01-29 MED ORDER — PROMETHAZINE HCL 25 MG/ML IJ SOLN
12.5000 mg | Freq: Once | INTRAMUSCULAR | Status: AC
  Administered 2017-01-29: 12.5 mg via INTRAVENOUS
  Filled 2017-01-29: qty 1

## 2017-01-29 NOTE — ED Notes (Signed)
PAtient given water to drink, tolerated well.

## 2017-01-29 NOTE — ED Provider Notes (Signed)
[facility] DEPT Provider Note   CSN: [id number] Arrival date & time: 01/29/17  1446     History   Chief Complaint Chief Complaint  Patient presents with  . Flank Pain    HPI Deborah Barnes is a 41 y.o. female with past medical history as outlined below, significant for occasional uti's who was seen here 2 nights ago and treated for right pyelonephritis (Ct imaging negative for ureterolithiasis) presenting with worsened pain, nausea with vomiting and reports has been unable to keep any po intake down since her last visit here. She denies abdominal pain, chest pain, shortness of breath and denies ever seeing hematuria, cloudy urine and has had no urgency or painful urination.  She got her antibiotic prescription filled today and took her first dose of keflex this morning which she was able to keep down. She has had subjective fevers. She reports generalized fatigue and weakness along with decreased urination.  She has found no alleviators.  She states she was unable to afford her antibiotic until today.    The history is provided by the patient.    Past Medical History:  Diagnosis Date  . Asthma   . Back pain   . Bipolar 1 disorder ([place])   . Bulging disc   . COPD (chronic obstructive pulmonary disease) ([place])   . Degenerative disc disease   . Depression with anxiety   . GERD (gastroesophageal reflux disease)   . Hypercholesterolemia   . Hypertension   . PTSD (post-traumatic stress disorder)   . Sciatica   . Shortness of breath     Patient Active Problem List   Diagnosis Date Noted  . Chronic midline low back pain with right-sided sciatica 08/10/2016  . Palpitations 08/10/2016  . Edema 08/10/2016  . Irritable bowel syndrome with diarrhea 08/10/2016  . Bipolar I disorder ([place]) 08/10/2016  . Gastroesophageal reflux disease without esophagitis 08/10/2016  . Mild intermittent asthma 08/10/2016  . Diverticulosis of colon without hemorrhage   . History of colonic polyps     . Chronic diarrhea of unknown origin   . Mucosal abnormality of stomach   . Loose stools 08/27/2014  . Abdominal pain, chronic, epigastric 08/27/2014  . Anal fissure 12/24/2013    Past Surgical History:  Procedure Laterality Date  . BIOPSY N/A 09/25/2014   Procedure: BIOPSY;  Surgeon: Daneil Dolin, MD;  Location: [facility];  Service: Endoscopy;  Laterality: N/A;  Gastric, Ascending Colon, Descending/Sigmoid Colon   . CARPAL TUNNEL RELEASE Bilateral   . CHOLECYSTECTOMY N/A 04/26/2013   Procedure: LAPAROSCOPIC CHOLECYSTECTOMY;  Surgeon: Jamesetta So, MD;  Location: [facility];  Service: General;  Laterality: N/A;  . COLONOSCOPY WITH PROPOFOL N/A 09/25/2014   RMR: Pancolonic diverticulosis. Multiple colonic polyps removed as described above. Status post segmental bisopsy.  . ESOPHAGOGASTRODUODENOSCOPY (EGD) WITH PROPOFOL N/A 09/25/2014   RMR: Normal esophagus. Abnormal gastric mucosa status post biopsy. Hiatal hernia.   Fatima Blank HERNIA REPAIR N/A 10/04/2013   Procedure: HERNIA REPAIR INCISIONAL WITH MESH;  Surgeon: Jamesetta So, MD;  Location: [facility];  Service: General;  Laterality: N/A;  . INSERTION OF MESH N/A 10/04/2013   Procedure: INSERTION OF MESH;  Surgeon: Jamesetta So, MD;  Location: [facility];  Service: General;  Laterality: N/A;  . POLYPECTOMY N/A 09/25/2014   Procedure: POLYPECTOMY;  Surgeon: Daneil Dolin, MD;  Location: [facility];  Service: Endoscopy;  Laterality: N/A;  Cecal, Ascending Colon   . SHOULDER SURGERY    . TUBAL LIGATION  OB History    Gravida Para Term Preterm AB Living   11 2 2   9 2    SAB TAB Ectopic Multiple Live Births   9               Home Medications    Prior to Admission medications   Medication Sig Start Date End Date Taking? Authorizing Provider  albuterol (PROVENTIL HFA;VENTOLIN HFA) 108 (90 BASE) MCG/ACT inhaler Inhale 1-2 puffs into the lungs every 6 (six) hours as needed for wheezing or shortness of breath.    Yes [provider]   atenolol (TENORMIN) 25 MG tablet TAKE ONE (1) TABLET EACH DAY 09/09/16  Yes Terald Sleeper, PA-C  beclomethasone (QVAR) 80 MCG/ACT inhaler Inhale 2 puffs into the lungs 2 (two) times daily.   Yes [provider]  busPIRone (BUSPAR) 10 MG tablet Take 10 mg by mouth 3 (three) times daily.    [provider]  carbamazepine (TEGRETOL) 200 MG tablet Take 200 mg by mouth 2 (two) times daily.    [provider]  cephALEXin (KEFLEX) 500 MG capsule Take 1 capsule (500 mg total) by mouth 4 (four) times daily. 12/11/38   Delora Fuel, MD  Cholecalciferol (VITAMIN D3) 5000 units CAPS Take 5,000 Units by mouth daily.    [provider]  cyclobenzaprine (FLEXERIL) 10 MG tablet TAKE ONE (1) TABLET THREE (3) TIMES EACH DAY Patient taking differently: TAKE 1 TABLET BY MOUTH THREE TIMES DAILY AS NEEDED FOR MUSCLE SPASMS 11/14/16   Terald Sleeper, PA-C  diazepam (VALIUM) 2 MG tablet Take 2 mg by mouth at bedtime.     [provider]  escitalopram (LEXAPRO) 20 MG tablet Take 20 mg by mouth daily.     [provider]  gabapentin (NEURONTIN) 300 MG capsule TAKE TWO CAPSULES BY MOUTH TWICE DAILY. 12/12/16   Terald Sleeper, PA-C  gemfibrozil (LOPID) 600 MG tablet TAKE ONE TABLET BY MOUTH TWICE DAILY 09/09/16   Terald Sleeper, PA-C  hydrochlorothiazide (HYDRODIURIL) 25 MG tablet TAKE ONE (1) TABLET EACH DAY 05/06/16   Terald Sleeper, PA-C  ibuprofen (ADVIL,MOTRIN) 600 MG tablet Take 1 tablet (600 mg total) by mouth every 6 (six) hours as needed. 01/29/17   Evalee Jefferson, PA-C  lamoTRIgine (LAMICTAL) 100 MG tablet Take 200 mg by mouth at bedtime. For BIPOLAR     [provider]  loratadine (CLARITIN) 10 MG tablet TAKE ONE (1) TABLET EACH DAY 11/14/16   Terald Sleeper, PA-C  meclizine (ANTIVERT) 25 MG tablet Take 1 tablet (25 mg total) by mouth 3 (three) times daily as needed for dizziness. 12/06/16   Nat Christen, MD  olopatadine (PATANOL) 0.1 % ophthalmic solution Place  1 drop into both eyes 2 (two) times daily.    [provider]  Oxycodone HCl 10 MG TABS Take 1-2 tablets (10-20 mg total) by mouth 3 (three) times daily. Use for severe pain Patient taking differently: Take 10-20 mg by mouth 3 (three) times daily as needed (for severe pain).  08/10/16   Terald Sleeper, PA-C  oxyCODONE-acetaminophen (PERCOCET) 5-325 MG tablet Take 1 tablet by mouth every 4 (four) hours as needed for moderate pain. 09/20/70   Delora Fuel, MD  pantoprazole (PROTONIX) 40 MG tablet TAKE ONE (1) TABLET EACH DAY 01/12/17   Terald Sleeper, PA-C  potassium chloride SA (K-DUR,KLOR-CON) 20 MEQ tablet Take 1 tablet (20 mEq total) by mouth 2 (two) times daily. 5/36/64   Delora Fuel, MD  promethazine (PHENERGAN) 25 MG tablet Take 1 tablet (25 mg total) by mouth every 6 (six) hours as needed for nausea or vomiting. 01/29/17   Yaseen Gilberg, Almyra Free, PA-C  triamcinolone cream (KENALOG) 0.1 % Apply 1 application topically 2 (two) times daily as needed (for itching/irritation).     [provider]    Family History Family History  Problem Relation Age of Onset  . Diabetes Father   . Colon cancer Maternal Grandmother     Social History Social History  Substance Use Topics  . Smoking status: Current Every Day Smoker    Packs/day: 1.00    Years: 20.00    Types: Cigarettes  . Smokeless tobacco: Never Used  . Alcohol use No     Comment: ETOH abuse in past, quit a few years ago.      Allergies   Clarithromycin and Darvocet [propoxyphene n-acetaminophen]   Review of Systems Review of Systems  Constitutional: Negative for chills and fever.  HENT: Negative for congestion and sore throat.   Eyes: Negative.   Respiratory: Negative for chest tightness and shortness of breath.   Cardiovascular: Negative for chest pain.  Gastrointestinal: Positive for nausea and vomiting. Negative for abdominal pain.       Flank pain.  Genitourinary: Negative.  Negative for decreased urine volume,  dysuria, urgency and vaginal discharge.  Musculoskeletal: Negative for arthralgias, joint swelling and neck pain.  Skin: Negative.  Negative for rash and wound.  Neurological: Negative for dizziness, weakness, light-headedness, numbness and headaches.  Psychiatric/Behavioral: Negative.      Physical Exam Updated Vital Signs BP 139/71   Pulse (!) 51   Temp 98.5 F (36.9 C) (Oral)   Resp 18   Ht 5\' 5"  (1.651 m)   Wt 98 kg   LMP 01/06/2017   SpO2 100%   BMI 35.94 kg/m   Physical Exam  Constitutional: She appears well-developed and well-nourished.  HENT:  Head: Normocephalic and atraumatic.  Mouth/Throat: Oropharynx is clear and moist.  Eyes: Conjunctivae are normal.  Neck: Normal range of motion.  Cardiovascular: Normal rate, regular rhythm, normal heart sounds and intact distal pulses.   Pulmonary/Chest: Effort normal and breath sounds normal. She has no wheezes.  Abdominal: Soft. Bowel sounds are normal. She exhibits no mass. There is no tenderness. There is CVA tenderness. There is no guarding.  Positive right cva ttp.  Musculoskeletal: Normal range of motion.  Neurological: She is alert.  Skin: Skin is warm and dry.  Psychiatric: She has a normal mood and affect.  Nursing note and vitals reviewed.    ED Treatments / Results  Labs (all labs ordered are listed, but only abnormal results are displayed) Labs Reviewed  COMPREHENSIVE METABOLIC PANEL - Abnormal; Notable for the following:       Result Value   Potassium 3.0 (*)    Glucose, Bld 119 (*)    All other components within normal limits  URINALYSIS, ROUTINE W REFLEX MICROSCOPIC - Abnormal; Notable for the following:    APPearance CLOUDY (*)    Protein, ur 100 (*)    Bacteria, UA RARE (*)    Squamous Epithelial / LPF 6-30 (*)    All other components within normal limits  CULTURE, BLOOD (ROUTINE X 2)  CULTURE, BLOOD (ROUTINE X 2)  LACTIC ACID, PLASMA  CBC WITH DIFFERENTIAL/PLATELET    EKG  EKG  Interpretation None       Radiology Ct Renal Stone Study  Result Date: 01/28/2017 CLINICAL DATA:  Right flank pain  and nausea since 10 p.m. Dysuria 2 days ago. EXAM: CT ABDOMEN AND PELVIS WITHOUT CONTRAST TECHNIQUE: Multidetector CT imaging of the abdomen and pelvis was performed following the standard protocol without IV contrast. COMPARISON:  01/20/2014 FINDINGS: Lower chest: The lung bases are clear. Hepatobiliary: No focal liver abnormality is seen. Status post cholecystectomy. No biliary dilatation. Pancreas: Unremarkable. No pancreatic ductal dilatation or surrounding inflammatory changes. Spleen: Normal in size without focal abnormality. Adrenals/Urinary Tract: Adrenal glands are unremarkable. Kidneys are normal, without renal calculi, focal lesion, or hydronephrosis. Bladder is unremarkable. Stomach/Bowel: Stomach is within normal limits. Appendix appears normal. No evidence of bowel wall thickening, distention, or inflammatory changes. Vascular/Lymphatic: No significant vascular findings are present. No enlarged abdominal or pelvic lymph nodes. Reproductive: Uterus and bilateral adnexa are unremarkable. Other: Small sub xiphoid hernia containing fat. No free air or free fluid in the abdomen. Musculoskeletal: Degenerative changes in the spine. No destructive bone lesions. IMPRESSION: No renal or ureteral stone or obstruction. No acute process demonstrated in the abdomen or pelvis. Electronically Signed   By: Lucienne Capers M.D.   On: 01/28/2017 04:46    Procedures Procedures (including critical care time)  Medications Ordered in ED Medications  sodium chloride 0.9 % bolus 1,000 mL (0 mLs Intravenous Stopped 01/29/17 1602)  ondansetron (ZOFRAN) injection 4 mg (4 mg Intravenous Given 01/29/17 1510)  potassium chloride SA (K-DUR,KLOR-CON) CR tablet 40 mEq (40 mEq Oral Given 01/29/17 1654)  ketorolac (TORADOL) 30 MG/ML injection 30 mg (30 mg Intravenous Given 01/29/17 1653)  promethazine  (PHENERGAN) injection 12.5 mg (12.5 mg Intravenous Given 01/29/17 1653)     Initial Impression / Assessment and Plan / ED Course  I have reviewed the triage vital signs and the nursing notes.  Pertinent labs & imaging results that were available during my care of the patient were reviewed by me and considered in my medical decision making (see chart for details).     Last visit and ct and lab results viewed including urine culture positive for e coli urinary infection, sensitivities still pending. Ct findings without perirenal stranding or edema suggesting pyelonephritis, but sx and urine findings c/w urinary infection.  Labs today stable including no indication for being overly dehydrated and continued hypokalemia.  Potassium supplementation given here.  She has prescription for supplement given at her last visit, encouraged to continue taking this.  She was encouraged to continue keflex, will prescribe phenergan for nausea/vomiting, advised motrin for pain relief.  Pt was given strict return precautions.  Plan f/u with pcp or return here within 48 hours if sx not improving with abx course.  Final Clinical Impressions(s) / ED Diagnoses   Final diagnoses:  Pyelonephritis  Hypokalemia    New Prescriptions New Prescriptions   IBUPROFEN (ADVIL,MOTRIN) 600 MG TABLET    Take 1 tablet (600 mg total) by mouth every 6 (six) hours as needed.   PROMETHAZINE (PHENERGAN) 25 MG TABLET    Take 1 tablet (25 mg total) by mouth every 6 (six) hours as needed for nausea or vomiting.     Evalee Jefferson, PA-C 01/29/17 [place], [place], DO 02/01/17 [phone]

## 2017-01-29 NOTE — Discharge Instructions (Signed)
Continue taking the medicines you were prescribed at your last visit, the antibiotic and the potassium supplement.  Start taking phenergan if needed for continued nausea or vomiting. Make sure you are drinking plenty of fluids.  There is no evidence of dehydration given your bloodwork today.  It appears that your antibiotic is working based on your urine test today, so make sure you complete this medicine.

## 2017-01-29 NOTE — ED Triage Notes (Signed)
Brought in by [facility] for right flank pain x3 days. States she was diagnosed with right kidney infection Friday. Took first dose of antibiotic today. Nausea and vomiting x2 days. EMS gave pt 4mg  of Zofran IVP enroute.

## 2017-01-30 LAB — URINE CULTURE

## 2017-01-31 ENCOUNTER — Telehealth: Payer: Self-pay | Admitting: Emergency Medicine

## 2017-01-31 NOTE — Telephone Encounter (Signed)
Post ED Visit - Positive Culture Follow-up  Culture report reviewed by antimicrobial stewardship pharmacist:  []  Elenor Quinones, Pharm.D. [x]  Heide Guile, Pharm.D., BCPS AQ-ID []  Parks Neptune, Pharm.D., BCPS []  Alycia Rossetti, Pharm.D., BCPS []  [place], Pharm.D., BCPS, AAHIVP []  Legrand Como, Pharm.D., BCPS, AAHIVP []  Salome Arnt, PharmD, BCPS []  Dimitri Ped, PharmD, BCPS []  Vincenza Hews, PharmD, BCPS  Positive urine culture Treated with cephalexin, organism sensitive to the same and no further patient follow-up is required at this time.  Hazle Nordmann 01/31/2017, 4:48 PM

## 2017-02-03 ENCOUNTER — Other Ambulatory Visit: Payer: Self-pay | Admitting: Physician Assistant

## 2017-02-03 LAB — CULTURE, BLOOD (ROUTINE X 2)
Culture: NO GROWTH
Culture: NO GROWTH
Special Requests: ADEQUATE
Special Requests: ADEQUATE

## 2017-03-10 ENCOUNTER — Emergency Department (HOSPITAL_COMMUNITY): Payer: Medicaid Other

## 2017-03-10 ENCOUNTER — Encounter (HOSPITAL_COMMUNITY): Payer: Self-pay | Admitting: Emergency Medicine

## 2017-03-10 ENCOUNTER — Emergency Department (HOSPITAL_COMMUNITY)
Admission: EM | Admit: 2017-03-10 | Discharge: 2017-03-11 | Disposition: A | Discharge status: Home / Self Care | Payer: Medicaid Other | Attending: Emergency Medicine | Admitting: Emergency Medicine

## 2017-03-10 DIAGNOSIS — Y999 Unspecified external cause status: Secondary | ICD-10-CM | POA: Diagnosis not present

## 2017-03-10 DIAGNOSIS — Z79899 Other long term (current) drug therapy: Secondary | ICD-10-CM | POA: Insufficient documentation

## 2017-03-10 DIAGNOSIS — Y939 Activity, unspecified: Secondary | ICD-10-CM | POA: Diagnosis not present

## 2017-03-10 DIAGNOSIS — J45909 Unspecified asthma, uncomplicated: Secondary | ICD-10-CM | POA: Insufficient documentation

## 2017-03-10 DIAGNOSIS — T148XXA Other injury of unspecified body region, initial encounter: Secondary | ICD-10-CM | POA: Diagnosis not present

## 2017-03-10 DIAGNOSIS — F17210 Nicotine dependence, cigarettes, uncomplicated: Secondary | ICD-10-CM | POA: Insufficient documentation

## 2017-03-10 DIAGNOSIS — S0990XA Unspecified injury of head, initial encounter: Secondary | ICD-10-CM | POA: Diagnosis present

## 2017-03-10 DIAGNOSIS — I10 Essential (primary) hypertension: Secondary | ICD-10-CM | POA: Diagnosis not present

## 2017-03-10 DIAGNOSIS — T07XXXA Unspecified multiple injuries, initial encounter: Secondary | ICD-10-CM

## 2017-03-10 DIAGNOSIS — Z7951 Long term (current) use of inhaled steroids: Secondary | ICD-10-CM | POA: Insufficient documentation

## 2017-03-10 DIAGNOSIS — Y92019 Unspecified place in single-family (private) house as the place of occurrence of the external cause: Secondary | ICD-10-CM | POA: Diagnosis not present

## 2017-03-10 MED ORDER — IBUPROFEN 800 MG PO TABS
800.0000 mg | ORAL_TABLET | Freq: Once | ORAL | Status: AC
  Administered 2017-03-10: 800 mg via ORAL
  Filled 2017-03-10: qty 1

## 2017-03-10 MED ORDER — IBUPROFEN 800 MG PO TABS: 800.0000 mg | ORAL_TABLET | Freq: Three times a day (TID) | ORAL | 0 refills | Status: DC
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
## 2017-03-10 NOTE — Discharge Instructions (Signed)
Apply ice to those areas that are tender as often as is comfortable for the next several days.  Expect gradual improvement in your symptoms over the next week.  Your CT scans are negative for acute internal injuries tonight.
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
## 2017-03-17 ENCOUNTER — Ambulatory Visit (INDEPENDENT_AMBULATORY_CARE_PROVIDER_SITE_OTHER): Payer: Medicaid Other

## 2017-03-17 ENCOUNTER — Encounter: Payer: Self-pay | Admitting: Physician Assistant

## 2017-03-17 ENCOUNTER — Ambulatory Visit (INDEPENDENT_AMBULATORY_CARE_PROVIDER_SITE_OTHER): Payer: Medicaid Other | Admitting: Physician Assistant

## 2017-03-17 VITALS — BP 118/89 | HR 81 | Temp 98.7°F | Ht 65.0 in | Wt 196.0 lb

## 2017-03-17 DIAGNOSIS — S6991XA Unspecified injury of right wrist, hand and finger(s), initial encounter: Secondary | ICD-10-CM

## 2017-03-17 DIAGNOSIS — S62666A Nondisplaced fracture of distal phalanx of right little finger, initial encounter for closed fracture: Secondary | ICD-10-CM | POA: Diagnosis not present

## 2017-03-17 DIAGNOSIS — S20219D Contusion of unspecified front wall of thorax, subsequent encounter: Secondary | ICD-10-CM

## 2017-03-17 DIAGNOSIS — M25531 Pain in right wrist: Secondary | ICD-10-CM | POA: Diagnosis not present

## 2017-03-17 DIAGNOSIS — S20219A Contusion of unspecified front wall of thorax, initial encounter: Secondary | ICD-10-CM | POA: Insufficient documentation

## 2017-03-17 MED ORDER — HYDROCODONE-ACETAMINOPHEN 10-325 MG PO TABS: 1.0000 | ORAL_TABLET | Freq: Three times a day (TID) | ORAL | 0 refills | Status: DC | PRN

## 2017-03-17 NOTE — Patient Instructions (Signed)
Finger Fracture A finger fracture is a break in any of the bones in your fingers. Usually, a broken finger is caused by an injury. This includes:  Getting hurt while playing sports.  Getting hurt at work.  Falling.  Your doctor may put a splint on your finger so it will not move while it heals (immobilization). Follow these instructions at home: If you have a splint  Wear the splint as told by your doctor. Take it off only as told by your doctor.  Loosen the splint if your fingers tingle, get numb, or turn cold and blue.  Keep the splint clean.  If the splint is not waterproof: ? Do not let it get wet. ? Cover it with a watertight covering when you take a bath or a shower. Managing pain, stiffness, and swelling  If directed, put ice on the injured area: ? If you have a removable splint, take it off as told by your doctor. ? Put ice in a plastic bag. ? Place a towel between your skin and the bag. ? Leave the ice on for 20 minutes, 2-3 times a day.  Move your fingers often to help with stiffness and swelling.  Raise (elevate) the injured area above the level of your heart while you are sitting or lying down. Driving  Do not drive or use heavy machinery while taking prescription pain medicine.  Ask your doctor when it is safe to drive if you have a splint. General instructions  Do not put pressure on any part of the splint until it is fully hardened. This may take several hours.  Do not use any products that contain nicotine or tobacco, such as cigarettes and e-cigarettes. These can delay bone healing. If you need help quitting, ask your doctor.  Take over-the-counter and prescription medicines only as told by your doctor.  Do exercises as told by your doctor.  Keep all follow-up visits as told by your doctor. This is important. Contact a doctor if:  Your pain or swelling gets worse.  You have trouble moving your finger. Get help right away if:  Your finger gets  numb or turns blue. Summary  A finger fracture is a break in any of the bones in your fingers.  Usually, a broken finger is caused by an injury. This may be from getting hurt in sports or at work or from falling.  You may need to wear a splint on your finger so it will not move while it heals (immobilization). This information is not intended to replace advice given to you by your health care provider. Make sure you discuss any questions you have with your health care provider. Document Released: 02/22/2008 Document Revised: 07/26/2016 Document Reviewed: 07/26/2016 [employer] Interactive Patient Education  2017 [employer]  
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
## 2017-03-27 ENCOUNTER — Other Ambulatory Visit: Payer: Self-pay | Admitting: Physician Assistant

## 2017-04-14 ENCOUNTER — Encounter: Payer: Self-pay | Admitting: Physician Assistant

## 2017-04-14 ENCOUNTER — Ambulatory Visit (INDEPENDENT_AMBULATORY_CARE_PROVIDER_SITE_OTHER): Payer: Medicaid Other | Admitting: Physician Assistant

## 2017-04-14 ENCOUNTER — Ambulatory Visit (INDEPENDENT_AMBULATORY_CARE_PROVIDER_SITE_OTHER): Payer: Medicaid Other

## 2017-04-14 VITALS — BP 107/75 | HR 77 | Temp 98.3°F | Ht 65.0 in | Wt 190.8 lb

## 2017-04-14 DIAGNOSIS — S62666A Nondisplaced fracture of distal phalanx of right little finger, initial encounter for closed fracture: Secondary | ICD-10-CM

## 2017-04-14 DIAGNOSIS — S62666G Nondisplaced fracture of distal phalanx of right little finger, subsequent encounter for fracture with delayed healing: Secondary | ICD-10-CM | POA: Diagnosis not present

## 2017-04-14 DIAGNOSIS — S6991XA Unspecified injury of right wrist, hand and finger(s), initial encounter: Secondary | ICD-10-CM

## 2017-04-14 DIAGNOSIS — S6991XD Unspecified injury of right wrist, hand and finger(s), subsequent encounter: Secondary | ICD-10-CM

## 2017-04-14 MED ORDER — HYDROCODONE-ACETAMINOPHEN 10-325 MG PO TABS: 1.0000 | ORAL_TABLET | Freq: Three times a day (TID) | ORAL | 0 refills | Status: DC | PRN

## 2017-04-14 NOTE — Progress Notes (Signed)
BP 107/75   Pulse 77   Temp 98.3 F (36.8 C) (Oral)   Ht 5\' 5"  (1.651 m)   Wt 190 lb 12.8 oz (86.5 kg)   LMP 04/07/2017   BMI 31.75 kg/m    Subjective:    Patient ID: Deborah Barnes, female    DOB: 1975-10-15, 41 y.o.   MRN: [id number]  HPI: Deborah Barnes is a 41 y.o. female presenting on 04/14/2017 for Follow-up (4 week rck of fractured finger)  This patient comes in for periodic recheck on medications and conditions including Recheck on the fractured fifth finger. She states she wore the finger splint for a couple weeks but her dog chewed it up. She has had unprotected over the past couple of weeks. She denies any severe pain at this time it is improved. There is decreased range of motion and swelling still at that joint. The x-ray does show a fracture still present. There is still good alignment. We will continue a splint for 4 more weeks. Can have her come back in 4 weeks for recheck..   All medications are reviewed today. There are no reports of any problems with the medications. All of the medical conditions are reviewed and updated.  Lab work is reviewed and will be ordered as medically necessary. There are no new problems reported with today's visit.   Relevant past medical, surgical, family and social history reviewed and updated as indicated. Allergies and medications reviewed and updated.  Past Medical History:  Diagnosis Date  . Asthma   . Back pain   . Bipolar 1 disorder ([place])   . Bulging disc   . COPD (chronic obstructive pulmonary disease) ([place])   . Degenerative disc disease   . Depression with anxiety   . GERD (gastroesophageal reflux disease)   . Hypercholesterolemia   . Hypertension   . PTSD (post-traumatic stress disorder)   . Sciatica   . Shortness of breath     Past Surgical History:  Procedure Laterality Date  . BIOPSY N/A 09/25/2014   Procedure: BIOPSY;  Surgeon: Daneil Dolin, MD;  Location: [facility];  Service: Endoscopy;  Laterality: N/A;   Gastric, Ascending Colon, Descending/Sigmoid Colon   . CARPAL TUNNEL RELEASE Bilateral   . CHOLECYSTECTOMY N/A 04/26/2013   Procedure: LAPAROSCOPIC CHOLECYSTECTOMY;  Surgeon: Jamesetta So, MD;  Location: [facility];  Service: General;  Laterality: N/A;  . COLONOSCOPY WITH PROPOFOL N/A 09/25/2014   RMR: Pancolonic diverticulosis. Multiple colonic polyps removed as described above. Status post segmental bisopsy.  . ESOPHAGOGASTRODUODENOSCOPY (EGD) WITH PROPOFOL N/A 09/25/2014   RMR: Normal esophagus. Abnormal gastric mucosa status post biopsy. Hiatal hernia.   Fatima Blank HERNIA REPAIR N/A 10/04/2013   Procedure: HERNIA REPAIR INCISIONAL WITH MESH;  Surgeon: Jamesetta So, MD;  Location: [facility];  Service: General;  Laterality: N/A;  . INSERTION OF MESH N/A 10/04/2013   Procedure: INSERTION OF MESH;  Surgeon: Jamesetta So, MD;  Location: [facility];  Service: General;  Laterality: N/A;  . POLYPECTOMY N/A 09/25/2014   Procedure: POLYPECTOMY;  Surgeon: Daneil Dolin, MD;  Location: [facility];  Service: Endoscopy;  Laterality: N/A;  Cecal, Ascending Colon   . SHOULDER SURGERY    . TUBAL LIGATION      Review of Systems  Constitutional: Negative.   HENT: Negative.   Eyes: Negative.   Respiratory: Negative.   Gastrointestinal: Negative.   Genitourinary: Negative.   Musculoskeletal: Positive for arthralgias and joint swelling.    Allergies  as of 04/14/2017      Reactions   Clarithromycin Diarrhea   Darvocet [propoxyphene N-acetaminophen] Rash      Medication List       Accurate as of 04/14/17 12:45 PM. Always use your most recent med list.          albuterol 108 (90 Base) MCG/ACT inhaler Commonly known as:  PROVENTIL HFA;VENTOLIN HFA Inhale 1-2 puffs into the lungs every 6 (six) hours as needed for wheezing or shortness of breath.   atenolol 25 MG tablet Commonly known as:  TENORMIN TAKE ONE (1) TABLET EACH DAY   busPIRone 10 MG tablet Commonly known as:  BUSPAR Take 10 mg by mouth 3  (three) times daily.   carbamazepine 200 MG tablet Commonly known as:  TEGRETOL Take 200 mg by mouth 2 (two) times daily.   cyclobenzaprine 10 MG tablet Commonly known as:  FLEXERIL TAKE ONE (1) TABLET THREE (3) TIMES EACH DAY   escitalopram 20 MG tablet Commonly known as:  LEXAPRO TAKE ONE (1) TABLET EACH DAY   gabapentin 300 MG capsule Commonly known as:  NEURONTIN TAKE TWO CAPSULES BY MOUTH TWICE DAILY.   hydrochlorothiazide 25 MG tablet Commonly known as:  HYDRODIURIL TAKE ONE (1) TABLET EACH DAY   HYDROcodone-acetaminophen 10-325 MG tablet Commonly known as:  NORCO Take 1 tablet by mouth every 8 (eight) hours as needed for severe pain.   ibuprofen 800 MG tablet Commonly known as:  ADVIL,MOTRIN Take 1 tablet (800 mg total) by mouth 3 (three) times daily.   lamoTRIgine 100 MG tablet Commonly known as:  LAMICTAL Take 200 mg by mouth at bedtime. For BIPOLAR   olopatadine 0.1 % ophthalmic solution Commonly known as:  PATANOL Place 1 drop into both eyes 2 (two) times daily.   pantoprazole 40 MG tablet Commonly known as:  PROTONIX TAKE ONE (1) TABLET EACH DAY   potassium chloride SA 20 MEQ tablet Commonly known as:  K-DUR,KLOR-CON Take 1 tablet (20 mEq total) by mouth 2 (two) times daily.   promethazine 25 MG tablet Commonly known as:  PHENERGAN Take 1 tablet (25 mg total) by mouth every 6 (six) hours as needed for nausea or vomiting.   triamcinolone cream 0.1 % Commonly known as:  KENALOG Apply 1 application topically 2 (two) times daily as needed (for itching/irritation).   Vitamin D3 5000 units Caps Take 5,000 Units by mouth daily.          Objective:    BP 107/75   Pulse 77   Temp 98.3 F (36.8 C) (Oral)   Ht 5\' 5"  (1.651 m)   Wt 190 lb 12.8 oz (86.5 kg)   LMP 04/07/2017   BMI 31.75 kg/m   Allergies  Allergen Reactions  . Clarithromycin Diarrhea  . Darvocet [Propoxyphene N-Acetaminophen] Rash    Physical Exam  Constitutional: She is  oriented to person, place, and time. She appears well-developed and well-nourished.  HENT:  Head: Normocephalic and atraumatic.  Eyes: Pupils are equal, round, and reactive to light. Conjunctivae and EOM are normal.  Cardiovascular: Normal rate, regular rhythm, normal heart sounds and intact distal pulses.   Pulmonary/Chest: Effort normal and breath sounds normal.  Abdominal: Soft. Bowel sounds are normal.  Musculoskeletal:       Left hand: She exhibits decreased range of motion, tenderness and bony tenderness.       Hands: Neurological: She is alert and oriented to person, place, and time. She has normal reflexes.  Skin: Skin is warm and  dry. No rash noted.  Psychiatric: She has a normal mood and affect. Her behavior is normal. Judgment and thought content normal.  Nursing note and vitals reviewed.       Assessment & Plan:   1. Closed nondisplaced fracture of distal phalanx of right little finger with delayed healing, subsequent encounter - DG Finger Little Right; Future  2. Injury of right hand, initial encounter - HYDROcodone-acetaminophen (NORCO) 10-325 MG tablet; Take 1 tablet by mouth every 8 (eight) hours as needed for severe pain.  Dispense: 90 tablet; Refill: 0  3. Closed nondisplaced fracture of distal phalanx of right little finger, initial encounter - HYDROcodone-acetaminophen (NORCO) 10-325 MG tablet; Take 1 tablet by mouth every 8 (eight) hours as needed for severe pain.  Dispense: 90 tablet; Refill: 0   Current Outpatient Prescriptions:  .  albuterol (PROVENTIL HFA;VENTOLIN HFA) 108 (90 BASE) MCG/ACT inhaler, Inhale 1-2 puffs into the lungs every 6 (six) hours as needed for wheezing or shortness of breath. , Disp: , Rfl:  .  atenolol (TENORMIN) 25 MG tablet, TAKE ONE (1) TABLET EACH DAY, Disp: 30 tablet, Rfl: 0 .  busPIRone (BUSPAR) 10 MG tablet, Take 10 mg by mouth 3 (three) times daily., Disp: , Rfl:  .  carbamazepine (TEGRETOL) 200 MG tablet, Take 200 mg by mouth 2  (two) times daily., Disp: , Rfl:  .  Cholecalciferol (VITAMIN D3) 5000 units CAPS, Take 5,000 Units by mouth daily., Disp: , Rfl:  .  cyclobenzaprine (FLEXERIL) 10 MG tablet, TAKE ONE (1) TABLET THREE (3) TIMES EACH DAY, Disp: 30 tablet, Rfl: 0 .  escitalopram (LEXAPRO) 20 MG tablet, TAKE ONE (1) TABLET EACH DAY, Disp: 30 tablet, Rfl: 0 .  gabapentin (NEURONTIN) 300 MG capsule, TAKE TWO CAPSULES BY MOUTH TWICE DAILY., Disp: 120 capsule, Rfl: 5 .  hydrochlorothiazide (HYDRODIURIL) 25 MG tablet, TAKE ONE (1) TABLET EACH DAY, Disp: 30 tablet, Rfl: 12 .  HYDROcodone-acetaminophen (NORCO) 10-325 MG tablet, Take 1 tablet by mouth every 8 (eight) hours as needed for severe pain., Disp: 90 tablet, Rfl: 0 .  ibuprofen (ADVIL,MOTRIN) 800 MG tablet, Take 1 tablet (800 mg total) by mouth 3 (three) times daily., Disp: 21 tablet, Rfl: 0 .  lamoTRIgine (LAMICTAL) 100 MG tablet, Take 200 mg by mouth at bedtime. For BIPOLAR , Disp: , Rfl:  .  olopatadine (PATANOL) 0.1 % ophthalmic solution, Place 1 drop into both eyes 2 (two) times daily., Disp: , Rfl:  .  pantoprazole (PROTONIX) 40 MG tablet, TAKE ONE (1) TABLET EACH DAY, Disp: 30 tablet, Rfl: 11 .  promethazine (PHENERGAN) 25 MG tablet, Take 1 tablet (25 mg total) by mouth every 6 (six) hours as needed for nausea or vomiting., Disp: 15 tablet, Rfl: 0 .  triamcinolone cream (KENALOG) 0.1 %, Apply 1 application topically 2 (two) times daily as needed (for itching/irritation). , Disp: , Rfl:  .  potassium chloride SA (K-DUR,KLOR-CON) 20 MEQ tablet, Take 1 tablet (20 mEq total) by mouth 2 (two) times daily. (Patient not taking: Reported on 03/17/2017), Disp: 20 tablet, Rfl: 0  Continue all other maintenance medications as listed above.  Follow up plan: Return in about 4 weeks (around 05/12/2017) for recheck.  Educational handout given for [place] PA-C [facility] [address]  [place],   [phone]   04/14/2017, 12:45 PM

## 2017-04-14 NOTE — Patient Instructions (Signed)
In a few days you may receive a survey in the mail or online from [employer] regarding your visit with us today. Please take a moment to fill this out. Your feedback is very important to our whole office. It can help us better understand your needs as well as improve your experience and satisfaction. Thank you for taking your time to complete it. We care about you.  Diannah Rindfleisch, PA-C  

## 2017-05-15 ENCOUNTER — Ambulatory Visit: Payer: Medicaid Other | Admitting: Physician Assistant

## 2017-08-16 ENCOUNTER — Other Ambulatory Visit: Payer: Self-pay

## 2017-08-16 ENCOUNTER — Observation Stay (HOSPITAL_COMMUNITY)
Admission: EM | Admit: 2017-08-16 | Discharge: 2017-08-17 | Disposition: A | Discharge status: Home / Self Care | Payer: Medicaid Other | Attending: Family Medicine | Admitting: Family Medicine

## 2017-08-16 ENCOUNTER — Encounter (HOSPITAL_COMMUNITY): Payer: Self-pay

## 2017-08-16 DIAGNOSIS — E7800 Pure hypercholesterolemia, unspecified: Secondary | ICD-10-CM | POA: Diagnosis not present

## 2017-08-16 DIAGNOSIS — J449 Chronic obstructive pulmonary disease, unspecified: Secondary | ICD-10-CM | POA: Insufficient documentation

## 2017-08-16 DIAGNOSIS — F321 Major depressive disorder, single episode, moderate: Secondary | ICD-10-CM

## 2017-08-16 DIAGNOSIS — Z79899 Other long term (current) drug therapy: Secondary | ICD-10-CM | POA: Diagnosis not present

## 2017-08-16 DIAGNOSIS — E869 Volume depletion, unspecified: Secondary | ICD-10-CM

## 2017-08-16 DIAGNOSIS — T50901A Poisoning by unspecified drugs, medicaments and biological substances, accidental (unintentional), initial encounter: Secondary | ICD-10-CM | POA: Diagnosis present

## 2017-08-16 DIAGNOSIS — T50992A Poisoning by other drugs, medicaments and biological substances, intentional self-harm, initial encounter: Principal | ICD-10-CM | POA: Insufficient documentation

## 2017-08-16 DIAGNOSIS — T50902A Poisoning by unspecified drugs, medicaments and biological substances, intentional self-harm, initial encounter: Secondary | ICD-10-CM

## 2017-08-16 DIAGNOSIS — I10 Essential (primary) hypertension: Secondary | ICD-10-CM | POA: Diagnosis not present

## 2017-08-16 DIAGNOSIS — F17210 Nicotine dependence, cigarettes, uncomplicated: Secondary | ICD-10-CM | POA: Diagnosis not present

## 2017-08-16 LAB — URINALYSIS, ROUTINE W REFLEX MICROSCOPIC
GLUCOSE, UA: NEGATIVE mg/dL
HGB URINE DIPSTICK: NEGATIVE
Ketones, ur: NEGATIVE mg/dL
LEUKOCYTES UA: NEGATIVE
NITRITE: NEGATIVE
PH: 6 (ref 5.0–8.0)
Protein, ur: 30 mg/dL — AB
SPECIFIC GRAVITY, URINE: 1.026 (ref 1.005–1.030)

## 2017-08-16 LAB — RAPID URINE DRUG SCREEN, HOSP PERFORMED
AMPHETAMINES: NOT DETECTED
BENZODIAZEPINES: NOT DETECTED
Barbiturates: NOT DETECTED
COCAINE: NOT DETECTED
OPIATES: NOT DETECTED
TETRAHYDROCANNABINOL: NOT DETECTED

## 2017-08-16 LAB — CBC WITH DIFFERENTIAL/PLATELET
BASOS PCT: 0 %
Basophils Absolute: 0 10*3/uL (ref 0.0–0.1)
EOS ABS: 0 10*3/uL (ref 0.0–0.7)
EOS PCT: 0 %
HCT: 41.2 % (ref 36.0–46.0)
HEMOGLOBIN: 13.6 g/dL (ref 12.0–15.0)
LYMPHS ABS: 0.9 10*3/uL (ref 0.7–4.0)
Lymphocytes Relative: 13 %
MCH: 30.6 pg (ref 26.0–34.0)
MCHC: 33 g/dL (ref 30.0–36.0)
MCV: 92.6 fL (ref 78.0–100.0)
MONO ABS: 0.2 10*3/uL (ref 0.1–1.0)
MONOS PCT: 3 %
NEUTROS PCT: 84 %
Neutro Abs: 5.6 10*3/uL (ref 1.7–7.7)
Platelets: 246 10*3/uL (ref 150–400)
RBC: 4.45 MIL/uL (ref 3.87–5.11)
RDW: 12.8 % (ref 11.5–15.5)
WBC: 6.7 10*3/uL (ref 4.0–10.5)

## 2017-08-16 LAB — COMPREHENSIVE METABOLIC PANEL
ALK PHOS: 84 U/L (ref 38–126)
ALT: 22 U/L (ref 14–54)
ANION GAP: 8 (ref 5–15)
AST: 23 U/L (ref 15–41)
Albumin: 4.1 g/dL (ref 3.5–5.0)
BUN: 8 mg/dL (ref 6–20)
CALCIUM: 9.1 mg/dL (ref 8.9–10.3)
CO2: 22 mmol/L (ref 22–32)
Chloride: 108 mmol/L (ref 101–111)
Creatinine, Ser: 0.45 mg/dL (ref 0.44–1.00)
GFR calc non Af Amer: >60 mL/min (ref >60)
Glucose, Bld: 118 mg/dL — ABNORMAL HIGH (ref 65–99)
POTASSIUM: 3.5 mmol/L (ref 3.5–5.1)
SODIUM: 138 mmol/L (ref 135–145)
TOTAL PROTEIN: 7.3 g/dL (ref 6.5–8.1)
Total Bilirubin: 0.6 mg/dL (ref 0.3–1.2)

## 2017-08-16 LAB — CARBAMAZEPINE LEVEL, TOTAL
Carbamazepine Lvl: 15.3 ug/mL (ref 4.0–12.0)
Carbamazepine Lvl: 12.6 ug/mL — ABNORMAL HIGH (ref 4.0–12.0)

## 2017-08-16 LAB — ETHANOL: Alcohol, Ethyl (B): <10 mg/dL (ref <10)

## 2017-08-16 LAB — AMMONIA: Ammonia: 40 umol/L — ABNORMAL HIGH (ref 9–35)

## 2017-08-16 LAB — SALICYLATE LEVEL

## 2017-08-16 LAB — ACETAMINOPHEN LEVEL

## 2017-08-16 LAB — PREGNANCY, URINE: Preg Test, Ur: NEGATIVE

## 2017-08-16 MED ORDER — ONDANSETRON HCL 4 MG/2ML IJ SOLN
4.0000 mg | Freq: Four times a day (QID) | INTRAMUSCULAR | Status: DC | PRN
  Administered 2017-08-16: 4 mg via INTRAVENOUS
  Filled 2017-08-16: qty 2

## 2017-08-16 MED ORDER — HYDROCHLOROTHIAZIDE 25 MG PO TABS
25.0000 mg | ORAL_TABLET | Freq: Every day | ORAL | Status: DC
  Administered 2017-08-17: 25 mg via ORAL
  Filled 2017-08-16: qty 1

## 2017-08-16 MED ORDER — OLOPATADINE HCL 0.1 % OP SOLN
1.0000 [drp] | Freq: Two times a day (BID) | OPHTHALMIC | Status: DC
  Administered 2017-08-17: 1 [drp] via OPHTHALMIC
  Filled 2017-08-16: qty 5

## 2017-08-16 MED ORDER — BUSPIRONE HCL 5 MG PO TABS
10.0000 mg | ORAL_TABLET | Freq: Three times a day (TID) | ORAL | Status: DC
  Administered 2017-08-17: 10 mg via ORAL
  Filled 2017-08-16: qty 2

## 2017-08-16 MED ORDER — VITAMIN D 1000 UNITS PO TABS
5000.0000 [IU] | ORAL_TABLET | Freq: Every day | ORAL | Status: DC
  Administered 2017-08-17: 5000 [IU] via ORAL
  Filled 2017-08-16: qty 5

## 2017-08-16 MED ORDER — ESCITALOPRAM OXALATE 10 MG PO TABS
20.0000 mg | ORAL_TABLET | Freq: Every day | ORAL | Status: DC
  Administered 2017-08-17: 20 mg via ORAL
  Filled 2017-08-16: qty 2

## 2017-08-16 MED ORDER — VITAMIN D3 125 MCG (5000 UT) PO CAPS: 5000.0000 [IU] | ORAL_CAPSULE | Freq: Every day | ORAL | Status: DC

## 2017-08-16 MED ORDER — ALBUTEROL SULFATE (2.5 MG/3ML) 0.083% IN NEBU
3.0000 mL | INHALATION_SOLUTION | Freq: Four times a day (QID) | RESPIRATORY_TRACT | Status: DC | PRN
  Administered 2017-08-17: 3 mL via RESPIRATORY_TRACT
  Filled 2017-08-16: qty 3

## 2017-08-16 MED ORDER — ATENOLOL 25 MG PO TABS
25.0000 mg | ORAL_TABLET | Freq: Every day | ORAL | Status: DC
  Administered 2017-08-17: 25 mg via ORAL
  Filled 2017-08-16: qty 1

## 2017-08-16 MED ORDER — POTASSIUM CHLORIDE CRYS ER 20 MEQ PO TBCR: 40.0000 meq | EXTENDED_RELEASE_TABLET | Freq: Once | ORAL | Status: DC

## 2017-08-16 MED ORDER — POTASSIUM CHLORIDE IN NACL 20-0.9 MEQ/L-% IV SOLN
INTRAVENOUS | Status: DC
  Administered 2017-08-16 – 2017-08-17 (×2): via INTRAVENOUS

## 2017-08-16 MED ORDER — SODIUM CHLORIDE 0.9 % IV BOLUS (SEPSIS)
1000.0000 mL | Freq: Once | INTRAVENOUS | Status: AC
  Administered 2017-08-16: 1000 mL via INTRAVENOUS

## 2017-08-16 MED ORDER — ENOXAPARIN SODIUM 40 MG/0.4ML ~~LOC~~ SOLN: 40.0000 mg | SUBCUTANEOUS | Status: DC

## 2017-08-16 MED ORDER — PANTOPRAZOLE SODIUM 40 MG PO TBEC
40.0000 mg | DELAYED_RELEASE_TABLET | Freq: Every day | ORAL | Status: DC
  Administered 2017-08-17: 40 mg via ORAL
  Filled 2017-08-16: qty 1

## 2017-08-16 NOTE — ED Notes (Signed)
CRITICAL VALUE ALERT  Critical Value:  Tegretol   Date & Time Notied:  08/16/2017 1800  Provider Notified: Lita Mains   Orders Received/Actions taken:

## 2017-08-16 NOTE — H&P (Signed)
History and Physical  Deborah Barnes KVQ:[id number] DOB: 1976/01/21 DOA: 08/16/2017  Referring physician: Dr. Lita Mains, ER Physician. PCP: Terald Sleeper, PA-C  Outpatient Specialists:    Patient coming from: Home.  Chief Complaint: Drug Overdose  HPI: Patient is a 41 year old Caucasian Female with past medical history significant for Bipolar disorder, Depression, anxiety, PTSD amongst other medical history. According to the patient, she was released from the prison about 3 weeks ago for probation violation. Patient reports difficulty with life, without wanting to into details at the moment. Patient presented with overdose of 30 200mg  Tegretol tablets. Patient was said to have left a suicide note. Patient is known to a Teacher, music, but hasn't seen any Psychiatrist since patient came out of prison. Patient had nausea and vomiting, but has not had any since in the ED. Tegretol level was 15.3. Ammonia noted to be 40, and UA reveals SG of 1.026. No headache, no neck pain, no fever. No urinary symptoms and no other constitutional symptoms reported. ED physician discussed with poison control and monitoring of Tegretol level has been advised. Patient will be admitted for further assessment and management.  ED Course: Hydration and supportive care.  Pertinent labs: See above. Potassium is 3.5 EKG: Independently reviewed.  Imaging: independently reviewed.   Review of Systems: As in HPI. 12 point review of system was done.    Past Medical History:  Diagnosis Date  . Asthma   . Back pain   . Bipolar 1 disorder ([place])   . Bulging disc   . COPD (chronic obstructive pulmonary disease) ([place])   . Degenerative disc disease   . Depression with anxiety   . GERD (gastroesophageal reflux disease)   . Hypercholesterolemia   . Hypertension   . PTSD (post-traumatic stress disorder)   . Sciatica   . Shortness of breath     Past Surgical History:  Procedure Laterality Date  . BIOPSY N/A 09/25/2014   Procedure: BIOPSY;  Surgeon: Daneil Dolin, MD;  Location: [facility];  Service: Endoscopy;  Laterality: N/A;  Gastric, Ascending Colon, Descending/Sigmoid Colon   . CARPAL TUNNEL RELEASE Bilateral   . CHOLECYSTECTOMY N/A 04/26/2013   Procedure: LAPAROSCOPIC CHOLECYSTECTOMY;  Surgeon: Jamesetta So, MD;  Location: [facility];  Service: General;  Laterality: N/A;  . COLONOSCOPY WITH PROPOFOL N/A 09/25/2014   RMR: Pancolonic diverticulosis. Multiple colonic polyps removed as described above. Status post segmental bisopsy.  . ESOPHAGOGASTRODUODENOSCOPY (EGD) WITH PROPOFOL N/A 09/25/2014   RMR: Normal esophagus. Abnormal gastric mucosa status post biopsy. Hiatal hernia.   Fatima Blank HERNIA REPAIR N/A 10/04/2013   Procedure: HERNIA REPAIR INCISIONAL WITH MESH;  Surgeon: Jamesetta So, MD;  Location: [facility];  Service: General;  Laterality: N/A;  . INSERTION OF MESH N/A 10/04/2013   Procedure: INSERTION OF MESH;  Surgeon: Jamesetta So, MD;  Location: [facility];  Service: General;  Laterality: N/A;  . POLYPECTOMY N/A 09/25/2014   Procedure: POLYPECTOMY;  Surgeon: Daneil Dolin, MD;  Location: [facility];  Service: Endoscopy;  Laterality: N/A;  Cecal, Ascending Colon   . SHOULDER SURGERY    . TUBAL LIGATION       reports that she has been smoking cigarettes.  She has a 20.00 pack-year smoking history. she has never used smokeless tobacco. She reports that she drinks about 0.6 oz of alcohol per week. She reports that she uses drugs. Drug: Marijuana.  Allergies  Allergen Reactions  . Clarithromycin Diarrhea  . Darvocet [Propoxyphene N-Acetaminophen] Rash  Family History  Problem Relation Age of Onset  . Diabetes Father   . Colon cancer Maternal Grandmother      Prior to Admission medications   Medication Sig Start Date End Date Taking? Authorizing Provider  atenolol (TENORMIN) 25 MG tablet TAKE ONE (1) TABLET EACH DAY 02/03/17  Yes Terald Sleeper, PA-C  busPIRone (BUSPAR) 10 MG tablet Take 10 mg by mouth 3  (three) times daily.   Yes [provider]  carbamazepine (TEGRETOL) 200 MG tablet Take 200 mg by mouth 2 (two) times daily.   Yes [provider]  escitalopram (LEXAPRO) 20 MG tablet TAKE ONE (1) TABLET EACH DAY 03/27/17  Yes Terald Sleeper, PA-C  hydrOXYzine (VISTARIL) 50 MG capsule Take 100 mg by mouth at bedtime.   Yes [provider]  lamoTRIgine (LAMICTAL) 100 MG tablet Take 100 mg by mouth every 12 (twelve) hours. For BIPOLAR    Yes [provider]  pantoprazole (PROTONIX) 40 MG tablet TAKE ONE (1) TABLET EACH DAY 01/12/17  Yes Terald Sleeper, PA-C  albuterol (PROVENTIL HFA;VENTOLIN HFA) 108 (90 BASE) MCG/ACT inhaler Inhale 1-2 puffs into the lungs every 6 (six) hours as needed for wheezing or shortness of breath.     [provider]  Cholecalciferol (VITAMIN D3) 5000 units CAPS Take 5,000 Units by mouth daily.    [provider]  cyclobenzaprine (FLEXERIL) 10 MG tablet TAKE ONE (1) TABLET THREE (3) TIMES EACH DAY 03/27/17   Terald Sleeper, PA-C  gabapentin (NEURONTIN) 300 MG capsule TAKE TWO CAPSULES BY MOUTH TWICE DAILY. 12/12/16   Terald Sleeper, PA-C  hydrochlorothiazide (HYDRODIURIL) 25 MG tablet TAKE ONE (1) TABLET EACH DAY 05/06/16   Terald Sleeper, PA-C  HYDROcodone-acetaminophen (NORCO) 10-325 MG tablet Take 1 tablet by mouth every 8 (eight) hours as needed for severe pain. 04/14/17   Terald Sleeper, PA-C  ibuprofen (ADVIL,MOTRIN) 800 MG tablet Take 1 tablet (800 mg total) by mouth 3 (three) times daily. 03/10/17   Evalee Jefferson, PA-C  olopatadine (PATANOL) 0.1 % ophthalmic solution Place 1 drop into both eyes 2 (two) times daily.    [provider]  potassium chloride SA (K-DUR,KLOR-CON) 20 MEQ tablet Take 1 tablet (20 mEq total) by mouth 2 (two) times daily. Patient not taking: Reported on 8/65/7846 9/62/95   Delora Fuel, MD  promethazine (PHENERGAN) 25 MG tablet Take 1 tablet (25 mg total) by mouth every 6 (six) hours as needed  for nausea or vomiting. 01/29/17   Evalee Jefferson, PA-C  triamcinolone cream (KENALOG) 0.1 % Apply 1 application topically 2 (two) times daily as needed (for itching/irritation).     [provider]    Physical Exam: Vitals:   08/16/17 1700 08/16/17 1730 08/16/17 1800 08/16/17 1830  BP: 131/86 120/75 123/73 119/78  Pulse: 71 69 75 77  Resp: 18 15 15 16   Temp:      TempSrc:      SpO2: 97% 96% 98% 95%  Weight:      Height:        Constitutional:  . Appears depressed, with depressive affect. Eyes:  . No pallor. No jaundice.  ENMT:  . external ears, nose appear normal Neck:  . Neck is supple. No JVD Respiratory:  . CTA bilaterally..  . Respiratory effort normal. No retractions or accessory muscle use Cardiovascular:  . S1S2 . No LE extremity edema   Abdomen:  . Abdomen is soft and non tender. Organs are difficult to assess. Neurologic:  .  Awake and alert. . Moves all limbs.  Wt Readings from Last 3 Encounters:  08/16/17 81.6 kg (180 lb)  04/14/17 86.5 kg (190 lb 12.8 oz)  03/17/17 88.9 kg (196 lb)    I have personally reviewed following labs and imaging studies  Labs on Admission:  CBC: Recent Labs  Lab 08/16/17 1623  WBC 6.7  NEUTROABS 5.6  HGB 13.6  HCT 41.2  MCV 92.6  PLT 932   Basic Metabolic Panel: Recent Labs  Lab 08/16/17 1623  NA 138  K 3.5  CL 108  CO2 22  GLUCOSE 118*  BUN 8  CREATININE 0.45  CALCIUM 9.1   Liver Function Tests: Recent Labs  Lab 08/16/17 1623  AST 23  ALT 22  ALKPHOS 84  BILITOT 0.6  PROT 7.3  ALBUMIN 4.1   No results for input(s): LIPASE, AMYLASE in the last 168 hours. Recent Labs  Lab 08/16/17 1623  AMMONIA 40*   Coagulation Profile: No results for input(s): INR, PROTIME in the last 168 hours. Cardiac Enzymes: No results for input(s): CKTOTAL, CKMB, CKMBINDEX, TROPONINI in the last 168 hours. BNP (last 3 results) No results for input(s): PROBNP in the last 8760 hours. HbA1C: No results for  input(s): HGBA1C in the last 72 hours. CBG: No results for input(s): GLUCAP in the last 168 hours. Lipid Profile: No results for input(s): CHOL, HDL, LDLCALC, TRIG, CHOLHDL, LDLDIRECT in the last 72 hours. Thyroid Function Tests: No results for input(s): TSH, T4TOTAL, FREET4, T3FREE, THYROIDAB in the last 72 hours. Anemia Panel: No results for input(s): VITAMINB12, FOLATE, FERRITIN, TIBC, IRON, RETICCTPCT in the last 72 hours. Urine analysis:    Component Value Date/Time   COLORURINE AMBER (A) 08/16/2017 1600   APPEARANCEUR CLOUDY (A) 08/16/2017 1600   LABSPEC 1.026 08/16/2017 1600   PHURINE 6.0 08/16/2017 1600   GLUCOSEU NEGATIVE 08/16/2017 1600   HGBUR NEGATIVE 08/16/2017 1600   BILIRUBINUR SMALL (A) 08/16/2017 1600   KETONESUR NEGATIVE 08/16/2017 1600   PROTEINUR 30 (A) 08/16/2017 1600   UROBILINOGEN 0.2 01/20/2014 1212   NITRITE NEGATIVE 08/16/2017 1600   LEUKOCYTESUR NEGATIVE 08/16/2017 1600   Sepsis Labs: @LABRCNTIP (procalcitonin:4,lacticidven:4) )No results found for this or any previous visit (from the past 240 hour(s)).    Radiological Exams on Admission: No results found.  EKG: Independently reviewed.   Active Problems:   Drug overdose   Assessment/Plan 1. Drug overdose (Tegretol) 2. Suicidal behavior 3. Depression 4. History of Bipolar 5. Hypertension 6. Hyperlipidemia 7. Hypokalemia 8. Volume depletion 9. Hyperammonemia, likely related to Tegretol overdose    Observe patient on Telemetry floor  Serial Tegretol level  IVF  Monitor renal function and electrolytes  Psychiatry consult  Optimize BP  Continue antidepressants  Further management will depend on Hospital course.  DVT prophylaxis:Lovenox Code Status: Full Family Communication: None available Disposition Plan: Depends on Hospital course. Psychiatry to see patient   Consults called: Psychiatry   Admission status: Observation    Time spent: 60 minutes  Dana Allan,  MD  [facility] Pager #: [phone] 7PM-7AM contact night coverage as above   08/16/2017, 6:42 PM

## 2017-08-16 NOTE — ED Notes (Signed)
Pt meds counted and documented on Receipt for Patient's Home Meds Stored by Pharmacy form, called [facility] to notify meds counted/verified and ready

## 2017-08-16 NOTE — Progress Notes (Signed)
Dr. Baltazar Najjar paged to see if nausea medication could be ordered d/t pt vomiting upon transfer to room.

## 2017-08-16 NOTE — ED Notes (Signed)
This Rn has contacted Poison control and communicated with patti regarding patient and overdose, instructions were given collect tegretol level and tylenol level, and CMP, and to repeat tegretol level in 4-6 hours. Instructions at this time is to monitor patient and administer Iv fluids. Dr Lita Mains stating he also contacted poison control.

## 2017-08-16 NOTE — ED Notes (Signed)
Report to Ebony Hail, RN 300

## 2017-08-16 NOTE — ED Triage Notes (Signed)
Pt presents s/p intentional overdose on carbamazepine. Pt stating stating she administered 30 pills last night d/t hearing she would lose her home.

## 2017-08-16 NOTE — ED Notes (Signed)
Received call from CPS state they were supposed to meet with pt today, but will come and see pt tomorrow
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
## 2017-08-16 NOTE — Progress Notes (Signed)
Rn woke pt up to give medications. Pt refusing at this time d/t her being nauseated. Pt stated, "I just stopped throwing up. I don't want to take any medications at this time." Will continue to monitor

## 2017-08-16 NOTE — Progress Notes (Signed)
Pts belongings placed in medication room with label on it.  Pt called husband with cordless phone, Rn heard pt state that "you know why I did it. I can't live without my child" Sitter in room with pt. Will continue to monitor No more throwing up/dry heaving or c/o nausea after Zofran given.

## 2017-08-16 NOTE — ED Notes (Signed)
hospitalist at bedside

## 2017-08-17 ENCOUNTER — Encounter (HOSPITAL_COMMUNITY): Payer: Self-pay | Admitting: Emergency Medicine

## 2017-08-17 DIAGNOSIS — T50902A Poisoning by unspecified drugs, medicaments and biological substances, intentional self-harm, initial encounter: Secondary | ICD-10-CM | POA: Diagnosis not present

## 2017-08-17 LAB — RENAL FUNCTION PANEL
Albumin: 3.4 g/dL — ABNORMAL LOW (ref 3.5–5.0)
Anion gap: 6 (ref 5–15)
BUN: 7 mg/dL (ref 6–20)
CO2: 25 mmol/L (ref 22–32)
Calcium: 8.4 mg/dL — ABNORMAL LOW (ref 8.9–10.3)
Chloride: 106 mmol/L (ref 101–111)
Creatinine, Ser: 0.51 mg/dL (ref 0.44–1.00)
GFR calc Af Amer: >60 mL/min (ref >60)
GFR calc non Af Amer: >60 mL/min (ref >60)
Glucose, Bld: 91 mg/dL (ref 65–99)
Phosphorus: 2.9 mg/dL (ref 2.5–4.6)
Potassium: 3.3 mmol/L — ABNORMAL LOW (ref 3.5–5.1)
Sodium: 137 mmol/L (ref 135–145)

## 2017-08-17 LAB — CARBAMAZEPINE LEVEL, TOTAL
Carbamazepine Lvl: 9.7 ug/mL (ref 4.0–12.0)
Carbamazepine Lvl: 8.4 ug/mL (ref 4.0–12.0)
Carbamazepine Lvl: 6.2 ug/mL (ref 4.0–12.0)

## 2017-08-17 MED ORDER — HYDROCHLOROTHIAZIDE 25 MG PO TABS: 25.0000 mg | ORAL_TABLET | Freq: Every day | ORAL | 0 refills | Status: DC

## 2017-08-17 MED ORDER — IBUPROFEN 600 MG PO TABS
600.0000 mg | ORAL_TABLET | Freq: Four times a day (QID) | ORAL | Status: DC | PRN
  Administered 2017-08-17: 600 mg via ORAL
  Filled 2017-08-17: qty 1

## 2017-08-17 MED ORDER — GABAPENTIN 300 MG PO CAPS
600.0000 mg | ORAL_CAPSULE | Freq: Two times a day (BID) | ORAL | Status: DC
  Administered 2017-08-17: 600 mg via ORAL
  Filled 2017-08-17: qty 2

## 2017-08-17 NOTE — Progress Notes (Signed)
Patient meets criteria for inpatient treatment. CSW faxed referrals to the following inpatient facilities for review:  [facility], [facility], [facility], [facility], [place], [facility], [facility], [place]    TTS will continue to seek bed placement.   Radonna Ricker MSW, [place] Disposition [phone]

## 2017-08-17 NOTE — Progress Notes (Signed)
Epaged Dr. Adair Patter concerning pain and need for tylenol.

## 2017-08-17 NOTE — Discharge Summary (Signed)
Physician Discharge Summary  Deborah Barnes NOM:[id number] DOB: 08-31-1976 DOA: 08/16/2017  PCP: Terald Sleeper, PA-C  Admit date: 08/16/2017 Discharge date: 08/17/2017  Admitted From: Home Disposition: [facility]  Recommendations for Outpatient Follow-up:  1. Follow up with PCP in 1-2 weeks 2. Occasion adjustment per behavioral health physician 3. Please obtain BMP/CBC in one week   Home Health: None Equipment/Devices: None  Discharge Condition: Stable CODE STATUS: Full code Diet recommendation: Regular diet  Brief/Interim Summary: Patient is a 41 year old Caucasian Female with past medical history significant for Bipolar disorder, Depression, anxiety, PTSD amongst other medical history. According to the patient, she was released from the prison about 3 weeks ago for probation violation. Patient reports difficulty with life, without wanting to into details at the moment. Patient presented with overdose of 30 200mg  Tegretol tablets. Patient was said to have left a suicide note. Patient is known to a Teacher, music, but hasn't seen any Psychiatrist since patient came out of prison. Patient had nausea and vomiting, but has not had any since in the ED. Tegretol level was 15.3. Ammonia noted to be 40, and UA reveals SG of 1.026. No headache, no neck pain, no fever. No urinary symptoms and no other constitutional symptoms reported. ED physician discussed with poison control and monitoring of Tegretol level has been advised. Patient will be admitted for further assessment and management.  Patient Tegretol level decreased to normal range on 08/17/2017.  She was stable for discharge after being evaluated by behavioral health and was recommended for inpatient psychiatric hospitalization.  Social work assisted in placing patient in an inpatient psychiatric facility.    Discharge Diagnoses:  Active Problems:   Drug overdose    Discharge Instructions  Discharge Instructions     Call MD for:  difficulty breathing, headache or visual disturbances   Complete by:  As directed    Call MD for:  extreme fatigue   Complete by:  As directed    Call MD for:  persistant dizziness or light-headedness   Complete by:  As directed    Call MD for:  persistant nausea and vomiting   Complete by:  As directed    Call MD for:  severe uncontrolled pain   Complete by:  As directed    Call MD for:  temperature >100.4   Complete by:  As directed    Diet general   Complete by:  As directed    Discharge instructions   Complete by:  As directed    Medically cleared for discharge Medication adjustment per behavioural health   Increase activity slowly   Complete by:  As directed      Allergies as of 08/17/2017      Reactions   Clarithromycin Diarrhea   Darvocet [propoxyphene N-acetaminophen] Rash      Medication List    STOP taking these medications   carbamazepine 200 MG tablet Commonly known as:  TEGRETOL     TAKE these medications   albuterol 108 (90 Base) MCG/ACT inhaler Commonly known as:  PROVENTIL HFA;VENTOLIN HFA Inhale 1-2 puffs into the lungs every 6 (six) hours as needed for wheezing or shortness of breath.   atenolol 25 MG tablet Commonly known as:  TENORMIN TAKE ONE (1) TABLET EACH DAY   busPIRone 10 MG tablet Commonly known as:  BUSPAR Take 10 mg by mouth 3 (three) times daily.   escitalopram 20 MG tablet Commonly known as:  LEXAPRO TAKE ONE (1) TABLET EACH DAY  gabapentin 300 MG capsule Commonly known as:  NEURONTIN TAKE TWO CAPSULES BY MOUTH TWICE DAILY.   hydrochlorothiazide 25 MG tablet Commonly known as:  HYDRODIURIL Take 1 tablet (25 mg total) by mouth daily. Start taking on:  08/18/2017 What changed:  See the new instructions.   hydrOXYzine 50 MG capsule Commonly known as:  VISTARIL Take 100 mg by mouth at bedtime.   lamoTRIgine 100 MG tablet Commonly known as:  LAMICTAL Take 100 mg by mouth every 12 (twelve) hours. For  BIPOLAR   olopatadine 0.1 % ophthalmic solution Commonly known as:  PATANOL Place 1 drop into both eyes 2 (two) times daily.   pantoprazole 40 MG tablet Commonly known as:  PROTONIX TAKE ONE (1) TABLET EACH DAY   triamcinolone cream 0.1 % Commonly known as:  KENALOG Apply 1 application topically 2 (two) times daily as needed (for itching/irritation).       Allergies  Allergen Reactions  . Clarithromycin Diarrhea  . Darvocet [Propoxyphene N-Acetaminophen] Rash    Consultations:  Psychiatry    Procedures/Studies:  No results found.     Subjective: Patient seen this morning on rounds.  She voices some pain on her lip at site of lesion.  She also voices that she would like Vaseline for her lips due to dryness.  She denies any other pain.  Discharge Exam: Vitals:   08/17/17 0900 08/17/17 0926  BP:  134/78  Pulse:  78  Resp:  16  Temp: 98 F (36.7 C) 98.4 F (36.9 C)  SpO2:     Vitals:   08/16/17 2038 08/17/17 0531 08/17/17 0900 08/17/17 0926  BP: (!) 158/94 127/76  134/78  Pulse: 75 77  78  Resp: 18 20  16   Temp: 98.4 F (36.9 C) 98.6 F (37 C) 98 F (36.7 C) 98.4 F (36.9 C)  TempSrc: Oral Oral  Oral  SpO2: 100% 98%    Weight: 83.6 kg (184 lb 4.9 oz)     Height: 5\' 5"  (1.651 m)       General: Pt is alert, awake, not in acute distress Cardiovascular: RRR, S1/S2 +, no rubs, no gallops Respiratory: CTA bilaterally, no wheezing, no rhonchi Abdominal: Soft, NT, ND, bowel sounds + Extremities: no edema, no cyanosis    The results of significant diagnostics from this hospitalization (including imaging, microbiology, ancillary and laboratory) are listed below for reference.     Microbiology: No results found for this or any previous visit (from the past 240 hour(s)).   Labs: BNP (last 3 results) No results for input(s): BNP in the last 8760 hours. Basic Metabolic Panel: Recent Labs  Lab 08/16/17 1623 08/17/17 0700  NA 138 137  K 3.5 3.3*  CL  108 106  CO2 22 25  GLUCOSE 118* 91  BUN 8 7  CREATININE 0.45 0.51  CALCIUM 9.1 8.4*  PHOS  --  2.9   Liver Function Tests: Recent Labs  Lab 08/16/17 1623 08/17/17 0700  AST 23  --   ALT 22  --   ALKPHOS 84  --   BILITOT 0.6  --   PROT 7.3  --   ALBUMIN 4.1 3.4*   No results for input(s): LIPASE, AMYLASE in the last 168 hours. Recent Labs  Lab 08/16/17 1623  AMMONIA 40*   CBC: Recent Labs  Lab 08/16/17 1623  WBC 6.7  NEUTROABS 5.6  HGB 13.6  HCT 41.2  MCV 92.6  PLT 246   Cardiac Enzymes: No results for input(s): CKTOTAL, CKMB,  CKMBINDEX, TROPONINI in the last 168 hours. BNP: Invalid input(s): POCBNP CBG: No results for input(s): GLUCAP in the last 168 hours. D-Dimer No results for input(s): DDIMER in the last 72 hours. Hgb A1c No results for input(s): HGBA1C in the last 72 hours. Lipid Profile No results for input(s): CHOL, HDL, LDLCALC, TRIG, CHOLHDL, LDLDIRECT in the last 72 hours. Thyroid function studies No results for input(s): TSH, T4TOTAL, T3FREE, THYROIDAB in the last 72 hours.  Invalid input(s): FREET3 Anemia work up No results for input(s): VITAMINB12, FOLATE, FERRITIN, TIBC, IRON, RETICCTPCT in the last 72 hours. Urinalysis    Component Value Date/Time   COLORURINE AMBER (A) 08/16/2017 1600   APPEARANCEUR CLOUDY (A) 08/16/2017 1600   LABSPEC 1.026 08/16/2017 1600   PHURINE 6.0 08/16/2017 1600   GLUCOSEU NEGATIVE 08/16/2017 1600   HGBUR NEGATIVE 08/16/2017 1600   BILIRUBINUR SMALL (A) 08/16/2017 1600   KETONESUR NEGATIVE 08/16/2017 1600   PROTEINUR 30 (A) 08/16/2017 1600   UROBILINOGEN 0.2 01/20/2014 1212   NITRITE NEGATIVE 08/16/2017 1600   LEUKOCYTESUR NEGATIVE 08/16/2017 1600   Sepsis Labs Invalid input(s): PROCALCITONIN,  WBC,  LACTICIDVEN Microbiology No results found for this or any previous visit (from the past 240 hour(s)).   Time coordinating discharge: 35 minutes  SIGNED:   Loretha Stapler, MD  [facility] 08/17/2017, 1:14 PM Pager [phone] If 7PM-7AM, please contact night-coverage [email or web address] Password [email or web address]

## 2017-08-17 NOTE — BHH Counselor (Signed)
08/17/17- Pt accepted to [facility]. Accepting- Dr. Dareen Piano Report number: [phone] Accepted to the [facility].  Lorenza Cambridge, [facility] Triage Specialist

## 2017-08-17 NOTE — Progress Notes (Signed)
Staff called from [facility] and will call back if they have placement
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
## 2017-08-17 NOTE — Progress Notes (Signed)
Patient is to be discharged to [facility] in stable condition. Patient agreeable to transfer and [facility] made aware. Report called. Pt transported by [employer].  Celestia Khat, RN

## 2017-08-17 NOTE — Progress Notes (Signed)
Social worker visited this morning and discussed issues including son's living arrangements and where she will go after discharge.  Tearful during discussion but denies intent or plan for suicide.  Sitter at bedside

## 2017-08-18 LAB — HIV ANTIBODY (ROUTINE TESTING W REFLEX): HIV Screen 4th Generation wRfx: NONREACTIVE

## 2017-08-19 DIAGNOSIS — B1920 Unspecified viral hepatitis C without hepatic coma: Secondary | ICD-10-CM

## 2017-08-19 HISTORY — DX: Unspecified viral hepatitis C without hepatic coma: B19.20

## 2017-09-03 ENCOUNTER — Emergency Department (HOSPITAL_COMMUNITY): Payer: Medicaid Other

## 2017-09-03 ENCOUNTER — Other Ambulatory Visit: Payer: Self-pay

## 2017-09-03 ENCOUNTER — Encounter (HOSPITAL_COMMUNITY): Payer: Self-pay | Admitting: Emergency Medicine

## 2017-09-03 ENCOUNTER — Emergency Department (HOSPITAL_COMMUNITY)
Admission: EM | Admit: 2017-09-03 | Discharge: 2017-09-04 | Disposition: A | Discharge status: Other Acute Inpatient Hospital | Payer: Medicaid Other | Attending: Emergency Medicine | Admitting: Emergency Medicine

## 2017-09-03 DIAGNOSIS — F17210 Nicotine dependence, cigarettes, uncomplicated: Secondary | ICD-10-CM | POA: Diagnosis not present

## 2017-09-03 DIAGNOSIS — R45851 Suicidal ideations: Secondary | ICD-10-CM | POA: Diagnosis not present

## 2017-09-03 DIAGNOSIS — I10 Essential (primary) hypertension: Secondary | ICD-10-CM | POA: Insufficient documentation

## 2017-09-03 DIAGNOSIS — Z79899 Other long term (current) drug therapy: Secondary | ICD-10-CM | POA: Insufficient documentation

## 2017-09-03 DIAGNOSIS — E876 Hypokalemia: Secondary | ICD-10-CM | POA: Insufficient documentation

## 2017-09-03 DIAGNOSIS — Y939 Activity, unspecified: Secondary | ICD-10-CM | POA: Insufficient documentation

## 2017-09-03 DIAGNOSIS — S79911A Unspecified injury of right hip, initial encounter: Secondary | ICD-10-CM | POA: Diagnosis not present

## 2017-09-03 DIAGNOSIS — F1920 Other psychoactive substance dependence, uncomplicated: Secondary | ICD-10-CM | POA: Diagnosis not present

## 2017-09-03 DIAGNOSIS — J45909 Unspecified asthma, uncomplicated: Secondary | ICD-10-CM | POA: Insufficient documentation

## 2017-09-03 DIAGNOSIS — Y999 Unspecified external cause status: Secondary | ICD-10-CM | POA: Diagnosis not present

## 2017-09-03 DIAGNOSIS — Y929 Unspecified place or not applicable: Secondary | ICD-10-CM | POA: Diagnosis not present

## 2017-09-03 DIAGNOSIS — J449 Chronic obstructive pulmonary disease, unspecified: Secondary | ICD-10-CM | POA: Diagnosis not present

## 2017-09-03 LAB — COMPREHENSIVE METABOLIC PANEL
ALBUMIN: 4 g/dL (ref 3.5–5.0)
ALK PHOS: 67 U/L (ref 38–126)
ALT: 21 U/L (ref 14–54)
AST: 24 U/L (ref 15–41)
Anion gap: 9 (ref 5–15)
BILIRUBIN TOTAL: 0.5 mg/dL (ref 0.3–1.2)
BUN: 9 mg/dL (ref 6–20)
CALCIUM: 9.6 mg/dL (ref 8.9–10.3)
CO2: 24 mmol/L (ref 22–32)
Chloride: 110 mmol/L (ref 101–111)
Creatinine, Ser: 0.54 mg/dL (ref 0.44–1.00)
GFR calc Af Amer: >60 mL/min (ref >60)
GFR calc non Af Amer: >60 mL/min (ref >60)
GLUCOSE: 86 mg/dL (ref 65–99)
POTASSIUM: 3.3 mmol/L — AB (ref 3.5–5.1)
SODIUM: 143 mmol/L (ref 135–145)
TOTAL PROTEIN: 7.3 g/dL (ref 6.5–8.1)

## 2017-09-03 LAB — CBC WITH DIFFERENTIAL/PLATELET
BASOS ABS: 0 10*3/uL (ref 0.0–0.1)
BASOS PCT: 0 %
EOS ABS: 0.1 10*3/uL (ref 0.0–0.7)
EOS PCT: 2 %
HCT: 38.3 % (ref 36.0–46.0)
Hemoglobin: 12.4 g/dL (ref 12.0–15.0)
LYMPHS PCT: 30 %
Lymphs Abs: 1.6 10*3/uL (ref 0.7–4.0)
MCH: 30.4 pg (ref 26.0–34.0)
MCHC: 32.4 g/dL (ref 30.0–36.0)
MCV: 93.9 fL (ref 78.0–100.0)
MONO ABS: 0.4 10*3/uL (ref 0.1–1.0)
Monocytes Relative: 7 %
Neutro Abs: 3.3 10*3/uL (ref 1.7–7.7)
Neutrophils Relative %: 61 %
PLATELETS: 246 10*3/uL (ref 150–400)
RBC: 4.08 MIL/uL (ref 3.87–5.11)
RDW: 13.3 % (ref 11.5–15.5)
WBC: 5.3 10*3/uL (ref 4.0–10.5)

## 2017-09-03 LAB — RAPID URINE DRUG SCREEN, HOSP PERFORMED
Amphetamines: NOT DETECTED
BARBITURATES: NOT DETECTED
BENZODIAZEPINES: NOT DETECTED
COCAINE: POSITIVE — AB
Opiates: NOT DETECTED
TETRAHYDROCANNABINOL: POSITIVE — AB

## 2017-09-03 LAB — SALICYLATE LEVEL: Salicylate Lvl: <7 mg/dL (ref 2.8–30.0)

## 2017-09-03 LAB — ACETAMINOPHEN LEVEL

## 2017-09-03 LAB — ETHANOL: Alcohol, Ethyl (B): <10 mg/dL (ref <10)

## 2017-09-03 LAB — HCG, QUANTITATIVE, PREGNANCY: hCG, Beta Chain, Quant, S: <1 m[IU]/mL (ref <5)

## 2017-09-03 MED ORDER — LAMOTRIGINE 25 MG PO TABS
100.0000 mg | ORAL_TABLET | Freq: Two times a day (BID) | ORAL | Status: DC
  Administered 2017-09-03: 100 mg via ORAL
  Filled 2017-09-03: qty 4

## 2017-09-03 MED ORDER — ESCITALOPRAM OXALATE 10 MG PO TABS
20.0000 mg | ORAL_TABLET | Freq: Every day | ORAL | Status: DC
  Administered 2017-09-03: 20 mg via ORAL
  Filled 2017-09-03: qty 2
  Filled 2017-09-03 (×2): qty 1

## 2017-09-03 MED ORDER — HYDROCHLOROTHIAZIDE 25 MG PO TABS
25.0000 mg | ORAL_TABLET | Freq: Every day | ORAL | Status: DC
  Administered 2017-09-03: 25 mg via ORAL
  Filled 2017-09-03: qty 1

## 2017-09-03 MED ORDER — IBUPROFEN 400 MG PO TABS: 600.0000 mg | ORAL_TABLET | Freq: Three times a day (TID) | ORAL | Status: DC | PRN

## 2017-09-03 MED ORDER — PANTOPRAZOLE SODIUM 40 MG PO TBEC
40.0000 mg | DELAYED_RELEASE_TABLET | Freq: Every day | ORAL | Status: DC
  Administered 2017-09-03: 40 mg via ORAL
  Filled 2017-09-03: qty 1

## 2017-09-03 MED ORDER — IBUPROFEN 400 MG PO TABS
400.0000 mg | ORAL_TABLET | Freq: Once | ORAL | Status: AC
  Administered 2017-09-04: 400 mg via ORAL
  Filled 2017-09-03: qty 1

## 2017-09-03 MED ORDER — IBUPROFEN 400 MG PO TABS: 400.0000 mg | ORAL_TABLET | Freq: Three times a day (TID) | ORAL | Status: DC | PRN

## 2017-09-03 MED ORDER — GABAPENTIN 300 MG PO CAPS
600.0000 mg | ORAL_CAPSULE | Freq: Two times a day (BID) | ORAL | Status: DC
  Administered 2017-09-03: 600 mg via ORAL
  Filled 2017-09-03: qty 2

## 2017-09-03 MED ORDER — NICOTINE 21 MG/24HR TD PT24: 21.0000 mg | MEDICATED_PATCH | Freq: Every day | TRANSDERMAL | Status: DC

## 2017-09-03 MED ORDER — ALBUTEROL SULFATE HFA 108 (90 BASE) MCG/ACT IN AERS: 1.0000 | INHALATION_SPRAY | Freq: Four times a day (QID) | RESPIRATORY_TRACT | Status: DC | PRN

## 2017-09-03 MED ORDER — BUSPIRONE HCL 5 MG PO TABS
10.0000 mg | ORAL_TABLET | Freq: Three times a day (TID) | ORAL | Status: DC
  Filled 2017-09-03 (×2): qty 1
  Filled 2017-09-03: qty 2

## 2017-09-03 MED ORDER — ACETAMINOPHEN 500 MG PO TABS
1000.0000 mg | ORAL_TABLET | Freq: Once | ORAL | Status: AC
  Administered 2017-09-03: 1000 mg via ORAL
  Filled 2017-09-03: qty 2

## 2017-09-03 MED ORDER — ATENOLOL 25 MG PO TABS
25.0000 mg | ORAL_TABLET | Freq: Every day | ORAL | Status: DC
  Administered 2017-09-03: 25 mg via ORAL
  Filled 2017-09-03: qty 1

## 2017-09-03 MED ORDER — HYDROXYZINE PAMOATE 50 MG PO CAPS
100.0000 mg | ORAL_CAPSULE | Freq: Every day | ORAL | Status: DC
  Administered 2017-09-03: 100 mg via ORAL
  Filled 2017-09-03: qty 2

## 2017-09-03 NOTE — ED Notes (Signed)
Lab in drawing blood at this time

## 2017-09-03 NOTE — ED Notes (Signed)
Patient transported to CT 
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
## 2017-09-03 NOTE — ED Notes (Signed)
[employer] called to collect sharps box.
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
## 2017-09-04 ENCOUNTER — Inpatient Hospital Stay (HOSPITAL_COMMUNITY)
Admission: AD | Admit: 2017-09-04 | Discharge: 2017-09-15 | DRG: 885 | Disposition: A | Discharge status: Home / Self Care | Payer: Medicaid Other | Source: Intra-hospital | Attending: Psychiatry | Admitting: Psychiatry

## 2017-09-04 ENCOUNTER — Other Ambulatory Visit: Payer: Self-pay

## 2017-09-04 ENCOUNTER — Encounter (HOSPITAL_COMMUNITY): Payer: Self-pay

## 2017-09-04 DIAGNOSIS — F5102 Adjustment insomnia: Secondary | ICD-10-CM | POA: Diagnosis not present

## 2017-09-04 DIAGNOSIS — F4310 Post-traumatic stress disorder, unspecified: Secondary | ICD-10-CM | POA: Diagnosis present

## 2017-09-04 DIAGNOSIS — G894 Chronic pain syndrome: Secondary | ICD-10-CM | POA: Diagnosis not present

## 2017-09-04 DIAGNOSIS — F17210 Nicotine dependence, cigarettes, uncomplicated: Secondary | ICD-10-CM | POA: Diagnosis present

## 2017-09-04 DIAGNOSIS — Z9049 Acquired absence of other specified parts of digestive tract: Secondary | ICD-10-CM

## 2017-09-04 DIAGNOSIS — Z833 Family history of diabetes mellitus: Secondary | ICD-10-CM

## 2017-09-04 DIAGNOSIS — J3089 Other allergic rhinitis: Secondary | ICD-10-CM | POA: Diagnosis present

## 2017-09-04 DIAGNOSIS — F4010 Social phobia, unspecified: Secondary | ICD-10-CM | POA: Diagnosis present

## 2017-09-04 DIAGNOSIS — Z9851 Tubal ligation status: Secondary | ICD-10-CM

## 2017-09-04 DIAGNOSIS — F1190 Opioid use, unspecified, uncomplicated: Secondary | ICD-10-CM | POA: Diagnosis not present

## 2017-09-04 DIAGNOSIS — J309 Allergic rhinitis, unspecified: Secondary | ICD-10-CM | POA: Diagnosis not present

## 2017-09-04 DIAGNOSIS — R450 Nervousness: Secondary | ICD-10-CM | POA: Diagnosis not present

## 2017-09-04 DIAGNOSIS — Z885 Allergy status to narcotic agent status: Secondary | ICD-10-CM

## 2017-09-04 DIAGNOSIS — F1210 Cannabis abuse, uncomplicated: Secondary | ICD-10-CM | POA: Diagnosis present

## 2017-09-04 DIAGNOSIS — F1410 Cocaine abuse, uncomplicated: Secondary | ICD-10-CM | POA: Diagnosis present

## 2017-09-04 DIAGNOSIS — F314 Bipolar disorder, current episode depressed, severe, without psychotic features: Principal | ICD-10-CM | POA: Diagnosis present

## 2017-09-04 DIAGNOSIS — Z86010 Personal history of colonic polyps: Secondary | ICD-10-CM | POA: Diagnosis not present

## 2017-09-04 DIAGNOSIS — F1910 Other psychoactive substance abuse, uncomplicated: Secondary | ICD-10-CM | POA: Diagnosis not present

## 2017-09-04 DIAGNOSIS — K580 Irritable bowel syndrome with diarrhea: Secondary | ICD-10-CM | POA: Diagnosis present

## 2017-09-04 DIAGNOSIS — Z915 Personal history of self-harm: Secondary | ICD-10-CM

## 2017-09-04 DIAGNOSIS — G4700 Insomnia, unspecified: Secondary | ICD-10-CM | POA: Diagnosis present

## 2017-09-04 DIAGNOSIS — Z800 Family history of malignant neoplasm of digestive organs: Secondary | ICD-10-CM | POA: Diagnosis not present

## 2017-09-04 DIAGNOSIS — I10 Essential (primary) hypertension: Secondary | ICD-10-CM | POA: Diagnosis present

## 2017-09-04 DIAGNOSIS — R5381 Other malaise: Secondary | ICD-10-CM | POA: Diagnosis not present

## 2017-09-04 DIAGNOSIS — Z590 Homelessness: Secondary | ICD-10-CM

## 2017-09-04 DIAGNOSIS — K219 Gastro-esophageal reflux disease without esophagitis: Secondary | ICD-10-CM | POA: Diagnosis present

## 2017-09-04 DIAGNOSIS — G8929 Other chronic pain: Secondary | ICD-10-CM | POA: Diagnosis present

## 2017-09-04 DIAGNOSIS — F1120 Opioid dependence, uncomplicated: Secondary | ICD-10-CM | POA: Diagnosis present

## 2017-09-04 DIAGNOSIS — F1490 Cocaine use, unspecified, uncomplicated: Secondary | ICD-10-CM | POA: Diagnosis not present

## 2017-09-04 DIAGNOSIS — F1290 Cannabis use, unspecified, uncomplicated: Secondary | ICD-10-CM | POA: Diagnosis not present

## 2017-09-04 DIAGNOSIS — R3911 Hesitancy of micturition: Secondary | ICD-10-CM | POA: Diagnosis not present

## 2017-09-04 DIAGNOSIS — R5383 Other fatigue: Secondary | ICD-10-CM

## 2017-09-04 DIAGNOSIS — M2550 Pain in unspecified joint: Secondary | ICD-10-CM | POA: Diagnosis not present

## 2017-09-04 DIAGNOSIS — R4587 Impulsiveness: Secondary | ICD-10-CM | POA: Diagnosis not present

## 2017-09-04 DIAGNOSIS — K649 Unspecified hemorrhoids: Secondary | ICD-10-CM | POA: Diagnosis present

## 2017-09-04 DIAGNOSIS — Z7952 Long term (current) use of systemic steroids: Secondary | ICD-10-CM

## 2017-09-04 DIAGNOSIS — F419 Anxiety disorder, unspecified: Secondary | ICD-10-CM

## 2017-09-04 DIAGNOSIS — L089 Local infection of the skin and subcutaneous tissue, unspecified: Secondary | ICD-10-CM | POA: Diagnosis present

## 2017-09-04 DIAGNOSIS — E7800 Pure hypercholesterolemia, unspecified: Secondary | ICD-10-CM | POA: Diagnosis present

## 2017-09-04 DIAGNOSIS — F418 Other specified anxiety disorders: Secondary | ICD-10-CM | POA: Diagnosis present

## 2017-09-04 DIAGNOSIS — M5441 Lumbago with sciatica, right side: Secondary | ICD-10-CM | POA: Diagnosis present

## 2017-09-04 DIAGNOSIS — J449 Chronic obstructive pulmonary disease, unspecified: Secondary | ICD-10-CM | POA: Diagnosis present

## 2017-09-04 DIAGNOSIS — Z79899 Other long term (current) drug therapy: Secondary | ICD-10-CM

## 2017-09-04 DIAGNOSIS — R45851 Suicidal ideations: Secondary | ICD-10-CM | POA: Diagnosis present

## 2017-09-04 DIAGNOSIS — M25551 Pain in right hip: Secondary | ICD-10-CM | POA: Diagnosis present

## 2017-09-04 DIAGNOSIS — S79911A Unspecified injury of right hip, initial encounter: Secondary | ICD-10-CM | POA: Diagnosis not present

## 2017-09-04 DIAGNOSIS — Z881 Allergy status to other antibiotic agents status: Secondary | ICD-10-CM | POA: Diagnosis not present

## 2017-09-04 MED ORDER — BUSPIRONE HCL 10 MG PO TABS
10.0000 mg | ORAL_TABLET | Freq: Three times a day (TID) | ORAL | Status: DC
  Filled 2017-09-04: qty 1
  Filled 2017-09-04: qty 2
  Filled 2017-09-04 (×2): qty 1

## 2017-09-04 MED ORDER — GABAPENTIN 300 MG PO CAPS
600.0000 mg | ORAL_CAPSULE | Freq: Two times a day (BID) | ORAL | Status: DC
  Administered 2017-09-04: 600 mg via ORAL
  Filled 2017-09-04 (×5): qty 2

## 2017-09-04 MED ORDER — IBUPROFEN 400 MG PO TABS
400.0000 mg | ORAL_TABLET | Freq: Three times a day (TID) | ORAL | Status: DC | PRN
  Administered 2017-09-04 – 2017-09-06 (×5): 400 mg via ORAL
  Filled 2017-09-04 (×5): qty 1

## 2017-09-04 MED ORDER — POTASSIUM CHLORIDE CRYS ER 20 MEQ PO TBCR
40.0000 meq | EXTENDED_RELEASE_TABLET | Freq: Once | ORAL | Status: AC
  Administered 2017-09-04: 40 meq via ORAL
  Filled 2017-09-04: qty 2

## 2017-09-04 MED ORDER — ALBUTEROL SULFATE HFA 108 (90 BASE) MCG/ACT IN AERS: 1.0000 | INHALATION_SPRAY | Freq: Four times a day (QID) | RESPIRATORY_TRACT | Status: DC | PRN

## 2017-09-04 MED ORDER — HYDROXYZINE HCL 25 MG PO TABS
25.0000 mg | ORAL_TABLET | Freq: Four times a day (QID) | ORAL | Status: DC | PRN
  Administered 2017-09-04 – 2017-09-05 (×3): 25 mg via ORAL
  Filled 2017-09-04 (×3): qty 1

## 2017-09-04 MED ORDER — LAMOTRIGINE 25 MG PO TABS
50.0000 mg | ORAL_TABLET | Freq: Every day | ORAL | Status: DC
  Administered 2017-09-05 – 2017-09-15 (×11): 50 mg via ORAL
  Filled 2017-09-04 (×12): qty 2

## 2017-09-04 MED ORDER — LAMOTRIGINE 100 MG PO TABS
100.0000 mg | ORAL_TABLET | Freq: Two times a day (BID) | ORAL | Status: DC
  Administered 2017-09-04: 100 mg via ORAL
  Filled 2017-09-04 (×6): qty 1

## 2017-09-04 MED ORDER — GABAPENTIN 400 MG PO CAPS
400.0000 mg | ORAL_CAPSULE | Freq: Three times a day (TID) | ORAL | Status: DC
  Administered 2017-09-04 – 2017-09-06 (×7): 400 mg via ORAL
  Filled 2017-09-04 (×12): qty 1

## 2017-09-04 MED ORDER — HYDROXYZINE HCL 25 MG PO TABS: 100.0000 mg | ORAL_TABLET | Freq: Every day | ORAL | Status: DC

## 2017-09-04 MED ORDER — ALUM & MAG HYDROXIDE-SIMETH 200-200-20 MG/5ML PO SUSP
30.0000 mL | ORAL | Status: DC | PRN
  Administered 2017-09-04 – 2017-09-12 (×4): 30 mL via ORAL
  Filled 2017-09-04 (×4): qty 30

## 2017-09-04 MED ORDER — LAMOTRIGINE 100 MG PO TABS
100.0000 mg | ORAL_TABLET | Freq: Every day | ORAL | Status: DC
  Filled 2017-09-04: qty 1

## 2017-09-04 MED ORDER — MAGNESIUM HYDROXIDE 400 MG/5ML PO SUSP
30.0000 mL | Freq: Every day | ORAL | Status: DC | PRN
  Administered 2017-09-07 – 2017-09-08 (×2): 30 mL via ORAL
  Filled 2017-09-04 (×2): qty 30

## 2017-09-04 MED ORDER — ESCITALOPRAM OXALATE 20 MG PO TABS
20.0000 mg | ORAL_TABLET | Freq: Every day | ORAL | Status: DC
Start: 2017-09-04 — End: 2017-09-15
  Administered 2017-09-04 – 2017-09-15 (×12): 20 mg via ORAL
  Filled 2017-09-04 (×13): qty 1

## 2017-09-04 MED ORDER — PANTOPRAZOLE SODIUM 40 MG PO TBEC
40.0000 mg | DELAYED_RELEASE_TABLET | Freq: Every day | ORAL | Status: DC
Start: 2017-09-04 — End: 2017-09-15
  Administered 2017-09-04 – 2017-09-15 (×12): 40 mg via ORAL
  Filled 2017-09-04 (×14): qty 1

## 2017-09-04 MED ORDER — NICOTINE 21 MG/24HR TD PT24
21.0000 mg | MEDICATED_PATCH | Freq: Every day | TRANSDERMAL | Status: DC
  Administered 2017-09-04 – 2017-09-15 (×12): 21 mg via TRANSDERMAL
  Filled 2017-09-04 (×15): qty 1

## 2017-09-04 MED ORDER — HYDROCHLOROTHIAZIDE 25 MG PO TABS
25.0000 mg | ORAL_TABLET | Freq: Every day | ORAL | Status: DC
  Administered 2017-09-04 – 2017-09-15 (×12): 25 mg via ORAL
  Filled 2017-09-04 (×14): qty 1

## 2017-09-04 MED ORDER — ATENOLOL 25 MG PO TABS
25.0000 mg | ORAL_TABLET | Freq: Every day | ORAL | Status: DC
  Administered 2017-09-04 – 2017-09-15 (×12): 25 mg via ORAL
  Filled 2017-09-04 (×14): qty 1

## 2017-09-04 MED ORDER — TRAZODONE HCL 50 MG PO TABS
50.0000 mg | ORAL_TABLET | Freq: Every evening | ORAL | Status: DC | PRN
  Administered 2017-09-04 – 2017-09-10 (×7): 50 mg via ORAL
  Filled 2017-09-04 (×5): qty 1

## 2017-09-04 MED ORDER — ENSURE ENLIVE PO LIQD: 237.0000 mL | Freq: Two times a day (BID) | ORAL | Status: DC
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
## 2017-09-04 NOTE — ED Notes (Signed)
[facility] called for transport. 

## 2017-09-04 NOTE — ED Notes (Signed)
TTS in progress 

## 2017-09-04 NOTE — Progress Notes (Signed)
D   Pt complained of feeling very anxious   She said she had a bad phone call which caused her to feel that way   She is visible on the milieu and interacts well with her peers   She is cooperative and compliant with her treatment A   Verbal support given   Medications administered and effectiveness monitored   Q 15 min checks   Encourage coping skills and relaxation breathing exercises  R   Pt is safe at present time
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
## 2017-09-04 NOTE — Progress Notes (Signed)
Admission Note: Patient is a 41 year old female admitted to the unit with diagnoses of depression, anxiety and suicidal ideation.  Patient currently denies suicidal thoughts and verbally contracts for safety.  Patient presents with sad, flat affect and depressed mood.  Reports not having a place to go after discharge is one of her main stressor.  Admission plan of care reviewed and consent signed.  Skin assessment completed.  Skin is dry and intact.  Personal belonging searched.  No contraband found.  Patient ambulates with walker due to back pain and hip problem.  Oriented to the unit, staff and roommate.  Routine safety checks initiated.  Patient is safe on the unit.

## 2017-09-04 NOTE — BHH Group Notes (Signed)
[facility] Group Therapy 09/04/2017 1:15pm  Type of Therapy: Mental Health Association Presentation  Participation Level: Active  Participation Quality: Attentive  Affect: Appropriate  Cognitive: Oriented  Insight: Developing/Improving  Engagement in Therapy: Engaged  Modes of Intervention: Discussion, Education and Socialization  Summary of Progress/Problems: [place] ([place]) Speaker came to talk about his personal journey with mental health. The pt processed ways by which to relate to the speaker. [place] speaker provided handouts and educational information pertaining to groups and services offered by the [facility]. Pt was engaged in speaker's presentation and was receptive to resources provided.    [employer], LCSW 09/04/2017 2:51 PM

## 2017-09-05 DIAGNOSIS — F1910 Other psychoactive substance abuse, uncomplicated: Secondary | ICD-10-CM

## 2017-09-05 DIAGNOSIS — F17210 Nicotine dependence, cigarettes, uncomplicated: Secondary | ICD-10-CM

## 2017-09-05 DIAGNOSIS — F1410 Cocaine abuse, uncomplicated: Secondary | ICD-10-CM

## 2017-09-05 DIAGNOSIS — F1210 Cannabis abuse, uncomplicated: Secondary | ICD-10-CM

## 2017-09-05 LAB — LIPID PANEL
CHOL/HDL RATIO: 2.9 ratio
Cholesterol: 160 mg/dL (ref 0–200)
HDL: 56 mg/dL (ref >40)
LDL Cholesterol: 77 mg/dL (ref 0–99)
Triglycerides: 137 mg/dL (ref <150)
VLDL: 27 mg/dL (ref 0–40)

## 2017-09-05 LAB — TSH: TSH: 2.082 u[IU]/mL (ref 0.350–4.500)

## 2017-09-05 MED ORDER — ENSURE ENLIVE PO LIQD
237.0000 mL | Freq: Two times a day (BID) | ORAL | Status: DC
  Administered 2017-09-07 – 2017-09-14 (×6): 237 mL via ORAL

## 2017-09-05 MED ORDER — ONDANSETRON 4 MG PO TBDP
4.0000 mg | ORAL_TABLET | Freq: Three times a day (TID) | ORAL | Status: DC | PRN
  Administered 2017-09-05 – 2017-09-14 (×8): 4 mg via ORAL
  Filled 2017-09-05 (×9): qty 1

## 2017-09-05 MED ORDER — HYDROXYZINE HCL 50 MG PO TABS
50.0000 mg | ORAL_TABLET | Freq: Three times a day (TID) | ORAL | Status: DC | PRN
Start: 2017-09-05 — End: 2017-09-07
  Administered 2017-09-05 – 2017-09-07 (×5): 50 mg via ORAL
  Filled 2017-09-05 (×5): qty 1

## 2017-09-05 NOTE — Progress Notes (Signed)
DAR NOTE: Patient presents with anxious affect and depressed mood. Pt has been visible in the day area interacting with peers. Pt complained of back pain, nausea and vomiting. Pt report good night sleep, good appetite, low energy and poor concentration. Denies auditory and visual hallucinations.  Rates depression at 7, hopelessness at 7, and anxiety at 9.  Maintained on routine safety checks.  Medications given as prescribed.  Support and encouragement offered as needed.  Attended group and participated.  States goal for today is " try not to over think and not cry."  Patient observed socializing with peers in the dayroom.  Offered no complaint.

## 2017-09-05 NOTE — Progress Notes (Signed)
NUTRITION ASSESSMENT  Pt identified as at risk on the Malnutrition Screen Tool  INTERVENTION: 1. Supplements: Ensure Enlive po BID, each supplement provides 350 kcal and 20 grams of protein  NUTRITION DIAGNOSIS: Unintentional weight loss related to sub-optimal intake as evidenced by pt report.   Goal: Pt to meet >/= 90% of their estimated nutrition needs.  Monitor:  PO intake  Assessment:  Pt admitted with bipolar disorder and use of cocaine, opiates and cannabis PTA. Pt reports poor appetite. Pt ate 100% of a meal in the ED on 12/17.  Per chart review, pt has lost 30 lb since 5/13 (14% wt loss x 5 months, significant for time frame). RD will order ensure supplements.   Height: Ht Readings from Last 1 Encounters:  09/04/17 5' 4.17" (1.63 m)    Weight: Wt Readings from Last 1 Encounters:  09/04/17 186 lb 8 oz (84.6 kg)    Weight Hx: Wt Readings from Last 10 Encounters:  09/04/17 186 lb 8 oz (84.6 kg)  09/03/17 185 lb (83.9 kg)  08/16/17 184 lb 4.9 oz (83.6 kg)  04/14/17 190 lb 12.8 oz (86.5 kg)  03/17/17 196 lb (88.9 kg)  03/10/17 195 lb (88.5 kg)  01/29/17 216 lb (98 kg)  01/28/17 216 lb (98 kg)  12/06/16 219 lb (99.3 kg)  09/29/16 219 lb (99.3 kg)    BMI:  Body mass index is 31.84 kg/m. Pt meets criteria for obesity based on current BMI.  Estimated Nutritional Needs: Kcal: 25-30 kcal/kg Protein: > 1 gram protein/kg Fluid: 1 ml/kcal  Diet Order: Diet regular Room service appropriate? Yes; Fluid consistency: Thin Pt is also offered choice of unit snacks mid-morning and mid-afternoon.  Pt is eating as desired.   Lab results and medications reviewed.   Clayton Bibles, MS, RD, [place] Dietitian Pager: [phone] After Hours Pager: [phone]

## 2017-09-05 NOTE — Progress Notes (Signed)
  DATA ACTION RESPONSE  Objective- Pt. is visible in the dayroom, seen writing in journal.Presents with a flat/anxious affect and mood. Pt appears to be preoccupied with somatic c/o's and was hoping to talk to the MD tomorrow regarding adjustment of meds.  Subjective- Denies having any SI/HI/AVH at this time. Rates pain 8/10; Hip. Is cooperative and remains safe on the unit.  1:1 interaction in private to establish rapport. Encouragement, education, & support given from staff.  PRN Trazodone and Vistaril requested and will re-eval accordingly.   Safety maintained with Q 15 checks. Continue with POC.
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
## 2017-09-05 NOTE — BHH Group Notes (Signed)
Pt attended and participated in wrap up group, and rated their day a 3/10. Pt had no goal, because they just got to [facility]. A positive noted by the pt was that they got to talk to their son.

## 2017-09-05 NOTE — Progress Notes (Signed)
Recreation Therapy Notes  Animal-Assisted Activity (AAA) Program Checklist/Progress Notes Patient Eligibility Criteria Checklist & Daily Group note for Rec TxIntervention  Date: 12.18.2018 Time: 2:45pm Location: [address]   AAA/T Program Assumption of Risk Form signed by Patient/ or Parent Legal Guardian Yes  Patient is free of allergies or sever asthma Yes  Patient reports no fear of animals Yes  Patient reports no history of cruelty to animals Yes  Patient understands his/her participation is voluntary Yes  Patient washes hands before animal contact Yes  Patient washes hands after animal contact Yes  Behavioral Response: Appropriate   Education:Hand Washing, Appropriate Animal Interaction   Education Outcome: Acknowledges education.   Clinical Observations/Feedback: Patient attended session and interacted appropriately with therapy dog and peers.   Laureen Ochs Abhiraj Dozal, LRT/CTRS       Eythan Jayne L 09/05/2017 3:10 PM

## 2017-09-06 LAB — HEMOGLOBIN A1C
HEMOGLOBIN A1C: 5.1 % (ref 4.8–5.6)
MEAN PLASMA GLUCOSE: 100 mg/dL

## 2017-09-06 MED ORDER — LIDOCAINE 5 % EX PTCH
1.0000 | MEDICATED_PATCH | CUTANEOUS | Status: DC
  Administered 2017-09-06 – 2017-09-07 (×2): 1 via TRANSDERMAL
  Filled 2017-09-06 (×4): qty 1

## 2017-09-06 MED ORDER — GABAPENTIN 400 MG PO CAPS
400.0000 mg | ORAL_CAPSULE | Freq: Four times a day (QID) | ORAL | Status: DC
  Administered 2017-09-06 – 2017-09-15 (×35): 400 mg via ORAL
  Filled 2017-09-06 (×39): qty 1

## 2017-09-06 MED ORDER — IBUPROFEN 800 MG PO TABS
800.0000 mg | ORAL_TABLET | Freq: Two times a day (BID) | ORAL | Status: DC | PRN
  Administered 2017-09-06 – 2017-09-15 (×13): 800 mg via ORAL
  Filled 2017-09-06 (×15): qty 1

## 2017-09-06 MED ORDER — LORATADINE 10 MG PO TABS
10.0000 mg | ORAL_TABLET | Freq: Every day | ORAL | Status: DC
  Administered 2017-09-06 – 2017-09-15 (×10): 10 mg via ORAL
  Filled 2017-09-06 (×11): qty 1

## 2017-09-06 NOTE — Progress Notes (Signed)
DAR NOTE: Pt present with calm  affect and pleasant mood in the unit. Pt has been visible in the milieu with peers. Complained of nausea and back pain. Took all her meds as scheduled. As per self inventory, pt had a good night sleep, fair appetite, low energy, and poor concentration. Pt rate depression at 8, hopeless ness at 6, and anxiety at 9. Pt's safety ensured with 15 minute and environmental checks. Pt currently denies SI/HI and A/V hallucinations. Pt verbally agrees to seek staff if SI/HI or A/VH occurs and to consult with staff before acting on these thoughts. Will continue POC.
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
## 2017-09-06 NOTE — Progress Notes (Signed)
DATA ACTION RESPONSE  Objective- Pt. is visible in the dayroom, seen talking on phone.Presents with a flat/anxious affect and mood.Pt appears to be preoccupied with somatic c/o's. Pt states she is satisfied regarding adjustment of meds today.  Subjective- Denies having any SI/HI/AVH at this time. Rates pain 9/10; Hip. Is cooperative and remains safe on the unit.  1:1 interaction in private to establish rapport. Encouragement, education, & support given from staff. PRN Trazodone and Vistaril requested and will re-eval accordingly.   Safety maintained with Q 15 checks. Continue with POC.

## 2017-09-06 NOTE — Progress Notes (Signed)
Recreation Therapy Notes  Date: 09/06/17 Time: 0930 Location: [address] Dayroom  Group Topic: Stress Management  Goal Area(s) Addresses:  Patient will verbalize importance of using healthy stress management.  Patient will identify positive emotions associated with healthy stress management.   Behavioral Response: Engaged  Intervention: Stress Management  Activity : Guided Imagery.  LRT introduced the stress management technique of guided imagery.  Patients were to listen and follow along as LRT read script to fully engage in the technique.  Education:  Stress Management, Discharge Planning.   Education Outcome: Acknowledges edcuation/In group clarification offered/Needs additional education  Clinical Observations/Feedback: Pt attended group.    Victorino Sparrow, LRT/CTRS         Victorino Sparrow A 09/06/2017 12:29 PM

## 2017-09-06 NOTE — [facility] Group Notes (Signed)
Pt attended and participated in wrap up group and rated their day a 2/10. Pt goal was to not to get so upset and they did not do a good job of it. Pt was informed that her son does want to see her, and that's a positive for the pt.
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
## 2017-09-06 NOTE — [facility] Suicide Risk Assessment (Signed)
[place] INPATIENT:  Family/Significant Other Suicide Prevention Education  Suicide Prevention Education:  Contact Attempts: Zachery Dakins (pt's cousin) [phone] has been identified by the patient as the family member/significant other with whom the patient will be residing, and identified as the person(s) who will aid the patient in the event of a mental health crisis.  With written consent from the patient, two attempts were made to provide suicide prevention education, prior to and/or following the patient's discharge.  We were unsuccessful in providing suicide prevention education.  A suicide education pamphlet was given to the patient to share with family/significant other.  Date and time of first attempt: 09/06/17 at 3:27PM (voicemail left requesting call back at her earliest convenience).  Date and time of second attempt: 09/07/17 at 11:04AM   [place] 09/06/2017, 3:27 PM

## 2017-09-07 MED ORDER — HYDROXYZINE HCL 50 MG PO TABS
50.0000 mg | ORAL_TABLET | Freq: Four times a day (QID) | ORAL | Status: DC | PRN
  Administered 2017-09-07 – 2017-09-14 (×16): 50 mg via ORAL
  Filled 2017-09-07 (×15): qty 1

## 2017-09-07 NOTE — Progress Notes (Addendum)
D:L  Patient's self inventory sheet, patient sleeps good, sleep medication helpful.  Poor appetite, low energy level, poor concentration.  Rated depression and anxiety 9, hopeless 10.  Withdrawals, cravings, nausea, irritability.  SI, no plan, contracts for safety.  Physical problems, pain, headaches, pain medication not helpful.  Goal is to get MD to take off tylenol allergy from record, not allergic to acetaminophen.  Anxiety med is not working, will discuss with MD.  Does have discharge plans.  Depression. A:  Medications administered per MD orders.   Emotional support and encouragement given patient. R:  SI, no plan, contracts for safety.  Denied HI.  Denied A/V hallucinations.  Safety maintained with 15 minute checks. Patient stated she is worried about her sons.  Children at her son's school say ugly statements to him about his dad's committing suicide.  Son does not have many friends.  Patient feels her son needs counseling services.

## 2017-09-07 NOTE — Progress Notes (Signed)
D: Pt  denies /SIHI/ AVH. Pt is pleasant and cooperative. Pt seen on the unit interacting with peers . Pt stated she needed   Nystatin cream for her "heat rash "  And hemorrhoid cream .   A: Pt was offered support and encouragement. Pt was given scheduled medications. Pt was encourage to attend groups. Q 15 minute checks were done for safety.    R:Pt attends groups and interacts well with peers and staff. Pt is taking medication. Pt receptive to treatment and safety maintained on unit.

## 2017-09-07 NOTE — [facility] Group Notes (Signed)
The focus of this group is to educate the patient on the purpose and policies of crisis stabilization and provide a format to answer questions about their admission.  The group details unit policies and expectations of patients while admitted.    Patient attended a portion of the 0900 nurse education orientation program this morning.  Patient actively participated and had appropriate affect.  Patient is alert.  Patient had appropriate insight and appropriate engagement.  Today patient will work on 3 goals for discharge.
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
## 2017-09-07 NOTE — [facility] Group Notes (Signed)
[facility] Group Therapy 09/07/2017 1:15pm  Type of Therapy: Mental Health Association Presentation  Participation Level: Active  Participation Quality: Attentive  Affect: Appropriate  Cognitive: Oriented  Insight: Developing/Improving  Engagement in Therapy: Engaged  Modes of Intervention: Discussion, Education and Socialization  Summary of Progress/Problems: Deborah (Craig) Speaker came to talk about his personal journey with mental health. The pt processed ways by which to relate to the speaker. [place] speaker provided handouts and educational information pertaining to groups and services offered by the [facility]. Pt was engaged in speaker's presentation and was receptive to resources provided.    [employer], LCSW 09/07/2017 2:52 PM

## 2017-09-07 NOTE — Plan of Care (Signed)
Nurse discussed anxiety, depression, coping skills with patient. 
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
## 2017-09-07 NOTE — Progress Notes (Signed)
[place] Group Notes:  (Nursing/MHT/Case Management/Adjunct)  Date:  09/07/2017  Time:  2045  Type of Therapy:  wrap up group  Participation Level:  Active  Participation Quality:  Appropriate, Attentive and Supportive  Affect:  Anxious  Cognitive:  Appropriate  Insight:  Improving  Engagement in Group:  Supportive  Modes of Intervention:  Clarification, Education and Support  Summary of Progress/Problems:  Deborah Barnes 09/07/2017, 9:59 PM

## 2017-09-07 NOTE — Plan of Care (Signed)
  Self-Concept: Level of anxiety will decrease 09/07/2017 2236 - Progressing by [place] Crosby, RN Note Pt stated she was doing a little better this evening.

## 2017-09-08 DIAGNOSIS — J309 Allergic rhinitis, unspecified: Secondary | ICD-10-CM

## 2017-09-08 DIAGNOSIS — I10 Essential (primary) hypertension: Secondary | ICD-10-CM

## 2017-09-08 DIAGNOSIS — G894 Chronic pain syndrome: Secondary | ICD-10-CM

## 2017-09-08 MED ORDER — FLUTICASONE PROPIONATE 50 MCG/ACT NA SUSP
2.0000 | Freq: Every day | NASAL | Status: DC
  Administered 2017-09-08 – 2017-09-15 (×7): 2 via NASAL
  Filled 2017-09-08: qty 16

## 2017-09-08 MED ORDER — NYSTATIN 100000 UNIT/GM EX POWD
Freq: Two times a day (BID) | CUTANEOUS | Status: DC | PRN
  Administered 2017-09-08 – 2017-09-14 (×10): via TOPICAL
  Filled 2017-09-08: qty 15

## 2017-09-08 MED ORDER — PHENYLEPH-SHARK LIV OIL-MO-PET 0.25-3-14-71.9 % RE OINT
TOPICAL_OINTMENT | Freq: Two times a day (BID) | RECTAL | Status: DC | PRN
  Administered 2017-09-09 – 2017-09-10 (×3): via RECTAL
  Filled 2017-09-08: qty 28.4

## 2017-09-08 MED ORDER — LIDOCAINE 5 % EX PTCH
1.0000 | MEDICATED_PATCH | CUTANEOUS | Status: DC
  Administered 2017-09-09 – 2017-09-15 (×7): 1 via TRANSDERMAL
  Filled 2017-09-08 (×8): qty 1

## 2017-09-08 NOTE — Progress Notes (Signed)
D.  Pt pleasant but anxious upon approach, states she is trying not to wear the ordered Lidocaine patch until morning so that she can wear it during the day and remove it at night.  Pt was positive for the evening AA group, observed engaged in appropriate interaction with peers on the unit.  Pt denies SI/HI/AVH at this time.  A.  Support and encouragement offered, medication given as ordered  R.  Pt remains safe on the unit, will continue to monitor.

## 2017-09-08 NOTE — Progress Notes (Signed)
Patient attended the evening A.A.meeting without incident.  
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
## 2017-09-08 NOTE — Progress Notes (Addendum)
Recreation Therapy Notes  Date: 09/08/17 Time: 0930 Location: [address] Dayroom  Group Topic: Stress Management  Goal Area(s) Addresses:  Patient will verbalize importance of using healthy stress management.  Patient will identify positive emotions associated with healthy stress management.   Intervention: Stress Management  Activity :  Progressive Muscle Relaxation.  LRT read a script to lead patients through the stress management technique of progressive muscle relaxation.  Patients were to follow along to engage in the activity as LRT read the script.  Education:  Stress Management, Discharge Planning.   Education Outcome: Acknowledges edcuation/In group clarification offered/Needs additional education  Clinical Observations/Feedback: Pt did not attend group.    Victorino Sparrow, LRT/CTRS          Ria Comment, Zaiah Credeur A 09/08/2017 11:18 AM

## 2017-09-08 NOTE — Progress Notes (Signed)
Nursing Note 09/08/2017 0700-1300  Data Reports sleeping fair with PRN sleep med.  Rates depression 8/10, hopelessness 7/10, and anxiety 9/10. Affect depressed.  Denies HI, SI, AVH.  Patient presents with multiple somatic complaints managed with PRNs- back pain, nausea, anxiety, congestion, constipation.  Walks slowly with walker, gait steady.   Action Spoke with patient 1:1, nurse offered support to patient throughout shift.  Continues to be monitored on 15 minute checks for safety.  Response Remains safe on unit.

## 2017-09-09 DIAGNOSIS — F5102 Adjustment insomnia: Secondary | ICD-10-CM

## 2017-09-09 MED ORDER — MENTHOL 3 MG MT LOZG: 1.0000 | LOZENGE | OROMUCOSAL | Status: DC | PRN

## 2017-09-09 MED ORDER — MIRTAZAPINE 7.5 MG PO TABS
7.5000 mg | ORAL_TABLET | Freq: Every day | ORAL | Status: DC
  Administered 2017-09-09 – 2017-09-14 (×6): 7.5 mg via ORAL
  Filled 2017-09-09 (×7): qty 1

## 2017-09-09 NOTE — [facility] Group Notes (Signed)
LCSW Group Therapy Note  Date/Time:  09/09/2017   1:15-2:15pm  Type of Therapy and Topic:  Group Therapy:  Fears and Unhealthy/Healthy Coping Skills  Participation Level:  Active   Description of Group:  The focus of this group was to discuss some of the prevalent fears that patients experience, and to identify the commonalities among group members.  A fun exercise was used to initiate the discussion, followed by writing on the white board a group-generated list of unhealthy coping and healthy coping techniques to deal with each fear.    Therapeutic Goals: 1. Patient will be able to distinguish between healthy and unhealthy coping skills 2. Patient will identify and describe 3 fears they experience 3. Patient will identify one positive coping strategy for each fear they experience 4. Patient will respond empathetically to peers' statements regarding fears they experience  Summary of Patient Progress:  The patient expressed a great deal of hopelessness, cried a lot during group, receiving much support from other patients.  Therapeutic Modalities Cognitive Behavioral Therapy Motivational Interviewing  Selmer Dominion, LCSW
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
## 2017-09-09 NOTE — Progress Notes (Signed)
D. Pt presents with an anxious affect and congruient behavior. Pt has been calm and cooperative- pleasant during interactions.Pt observed attending group and interacting appropriately with peers in milieu. Per pt's self inventory, pt rates her depression, hopelessness and anxiety a 03/24/09 respectively. Patient given lidocaine patch in am for chronic back pain and cepacol for sore throat. Pt currently denies SI/HI and AVH. A. Labs and vitals monitored. Pt compliant with medications. Pt supported emotionally and encouraged to express concerns and ask questions.   R. Pt remains safe with 15 minute checks. Will continue POC.

## 2017-09-09 NOTE — Progress Notes (Signed)
D.  Pt pleasant but pained on approach, states her chronic back pain is a 10/10 tonight.  Pt was positive for evening AA group and has been observed interacting appropriately with peers on the unit.  Pt denies SI/HI/AVH at this time. A.  Support and encouragement offered, medication given as ordered for pain (see pain flow sheet).  R.  Pt remains safe on the unit, will continue to monitor.

## 2017-09-10 MED ORDER — MUSCLE RUB 10-15 % EX CREA
TOPICAL_CREAM | CUTANEOUS | Status: DC | PRN
  Administered 2017-09-12: 10:00:00 via TOPICAL
  Filled 2017-09-10: qty 85
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
## 2017-09-10 NOTE — Progress Notes (Signed)
Nursing Progress Note: 7p-7a D: Pt currently presents with a anxious/pleasant affect and behavior. Pt states "I go to [facility] here soon . I'm nervous about it. I have never been homeless. It helps to have a place to go after this." Interacting appropriately with the milieu. Pt reports good sleep during the previous night with current medication regimen. Pt did attend wrap-up group.  A: Pt provided with medications per providers orders. Pt's labs and vitals were monitored throughout the night. Pt supported emotionally and encouraged to express concerns and questions. Pt educated on medications.  R: Pt's safety ensured with 15 minute and environmental checks. Pt currently denies SI, HI, and AVH. Pt verbally contracts to seek staff if SI,HI, or AVH occurs and to consult with staff before acting on any harmful thoughts. Will continue to monitor.

## 2017-09-10 NOTE — Plan of Care (Signed)
Nurse discussed depression, anxiety, coping skills with patient.  

## 2017-09-10 NOTE — [facility] Group Notes (Signed)

## 2017-09-10 NOTE — Progress Notes (Addendum)
D.  Pt observed in milieu interacting appropriately and in nurse led group this am. Pt has been calm and cooperative and pleasant during interactions. Pt's only complaint this am has been chronic back pain which was slightly relieved with prn med and lidocaine patch. Per pt's self inventory, pt rates her depression, hopelessness and anxiety a 6/5/5, respectively.  Pt currently denies SI/HI and AVH. Pt reports that her goal today is to "try and stay calm". A. Labs and vitals monitored. Pt compliant with medications. Pt supported emotionally and encouraged to express concerns and ask questions.   R. Pt remains safe with 15 minute checks. Will continue POC.

## 2017-09-10 NOTE — [facility] Group Notes (Signed)
[facility] LCSW Group Therapy Note  Date/Time:  09/10/2017 1:30-2:30pm  Type of Therapy and Topic:  Group Therapy:  Healthy and Unhealthy Supports  Participation Level:  Active   Description of Group:  Patients in this group were introduced to the idea of adding a variety of healthy supports to address the various needs in their lives.  Because of one patient's resistance and insistence that drugs help rather than harm, the Stages of Change were introduced and ideas proposed about appropriate supports for each stage.  Therapeutic Goals:   1)  discuss importance of adding supports to stay well once out of the hospital  2)  compare healthy versus unhealthy supports and identify some examples of each  3)  Describe Stages of Change  3)  generate ideas and descriptions of healthy supports that can be added for each Stage  5)  encourage active participation in and adherence to discharge plan    Summary of Patient Progress:  The patient shared well in group and gave feedback to others.   She was very different than yesterday in group, much more open to the idea of staying in the hospital to receive help, taking care of her own serious needs and not focusing on others right now so much.  She was one of the patients who actively addressed inappropriate comments by another patient about how suicide does not hurt anyone except the person who does it, and was also able to apologize later for "snapping" at the person.   Therapeutic Modalities:   Motivational Interviewing Brief Solution-Focused Therapy  Selmer Dominion, LCSW

## 2017-09-11 MED ORDER — TRAZODONE HCL 100 MG PO TABS
100.0000 mg | ORAL_TABLET | Freq: Every evening | ORAL | Status: DC | PRN
  Administered 2017-09-11 – 2017-09-14 (×4): 100 mg via ORAL
  Filled 2017-09-11 (×3): qty 1

## 2017-09-11 NOTE — Plan of Care (Signed)
  Progressing Education: Utilization of techniques to improve thought processes will improve 09/11/2017 0948 - Progressing by Joice Lofts, RN Knowledge of the prescribed therapeutic regimen will improve 09/11/2017 0948 - Progressing by Joice Lofts, RN Self-Concept: Ability to disclose and discuss suicidal ideas will improve 09/11/2017 0948 - Progressing by Joice Lofts, RN Ability to verbalize positive feelings about self will improve 09/11/2017 0948 - Progressing by Joice Lofts, RN Education: Emotional status will improve 09/11/2017 0948 - Progressing by Joice Lofts, RN Mental status will improve 09/11/2017 0948 - Progressing by Joice Lofts, RN Verbalization of understanding the information provided will improve 09/11/2017 0948 - Progressing by Joice Lofts, RN

## 2017-09-11 NOTE — Progress Notes (Signed)
Nursing Progress Note: 7p-7a D: Pt currently presents with a depressed/anxious affect and behavior. Pt states "Today has been horrible. I'm away from my kids. I feel like they hate me for being here on Christmas." Interacting minimally with the milieu. Pt reports good sleep during the previous night with current medication regimen. Pt did attend wrap-up group.  A: Pt provided with medications per providers orders. Pt's labs and vitals were monitored throughout the night. Pt supported emotionally and encouraged to express concerns and questions. Pt educated on medications.  R: Pt's safety ensured with 15 minute and environmental checks. Pt currently denies SI, HI, and AVH. Pt verbally contracts to seek staff if SI,HI, or AVH occurs and to consult with staff before acting on any harmful thoughts. Will continue to monitor.
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
## 2017-09-11 NOTE — Progress Notes (Signed)
D: Patient is pleasant upon approach; her mood is depressed.  She continues to report passive SI without a specific plan.  She reports poor sleep, however, her appetite is good.  Her energy is low and her concentration is poor.  She rates her depression and anxiety as a 10; hopelessness as an 8.  Patient has lower back pain and right leg pain from nerve damage.  She ambulates with a walker.  Her goal today is to "get through Christmas Eve without getting too much more depressed."  She is attending groups and appears engaged.  A: Continue to monitor medication management and MD orders.  Safety checks continued every 15 minutes per protocol.  Offer support and encouragement as needed.  R: Patient is receptive to staff; her behavior is appropriate.

## 2017-09-11 NOTE — Progress Notes (Signed)
Recreation Therapy Notes  Date: 09/11/17 Time: 0930 Location: [address] Group Room  Group Topic: Stress Management  Goal Area(s) Addresses:  Patient will verbalize importance of using healthy stress management.  Patient will identify positive emotions associated with healthy stress management.   Intervention: Stress Management  Activity :  Meditation.  LRT introduced the stress management technique of meditation.  LRT played a meditation from the Calm app.  Patients were to listen and follow along as meditation was played.  Education: Stress Management, Discharge Planning.   Education Outcome: Acknowledges edcuation/In group clarification offered/Needs additional education  Clinical Observations/Feedback: Pt did not attend group.   Victorino Sparrow, LRT/CTRS          Victorino Sparrow A 09/11/2017 12:06 PM

## 2017-09-12 NOTE — Progress Notes (Signed)
The patient did not attend group since she elected to remain in her bedroom.
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
## 2017-09-12 NOTE — Progress Notes (Signed)
D Patient is observed sitting in the dayroom this am..after returning to the Adult Unit from eating breakfast in the cafe'. She avoids making eye contact. She is depressed, flat and irritable . A SHe completed her daily assessment and on this she wrote she deneid having SI today and she rated her depression, hopelessness and anxeity " 10/10./10", respectively. R Safety is in place and poc cont.

## 2017-09-12 NOTE — Progress Notes (Signed)
Nursing Progress Note: 7p-7a D: Pt currently presents with a sad/depressed/flat affect and behavior. Pt states "I had a better day. I'm looking forward to actually seeing my sons on Thursday. My biggest concern is that they won't want to live with me. I can't get them iPhones." Interacting appropriately with the milieu. Pt reports good sleep during the previous night with current medication regimen. Pt did attend wrap-up group.  A: Pt provided with medications per providers orders. Pt's labs and vitals were monitored throughout the night. Pt supported emotionally and encouraged to express concerns and questions. Pt educated on medications.  R: Pt's safety ensured with 15 minute and environmental checks. Pt currently denies SI, HI, and AVH. Pt verbally contracts to seek staff if SI,HI, or AVH occurs and to consult with staff before acting on any harmful thoughts. Will continue to monitor.
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
## 2017-09-13 NOTE — [facility] Group Notes (Signed)
[facility] Group Notes:  (Nursing/MHT/Case Management/Adjunct)  Date:  09/13/2017  Time:  5:00 PM  Type of Therapy:  Psychoeducational Skills  Participation Level:  Active  Participation Quality:  Appropriate and Attentive  Affect:  Appropriate  Cognitive:  Alert and Appropriate  Insight:  Appropriate  Engagement in Group:  Engaged  Modes of Intervention:  Discussion, Education and Exploration  Summary of Progress/Problems: Today's group focused on crisis. Patients were asked to tell what brought them into the hospital, what are some barriers that they are facing, and what changes can they make when they leave here.  Patient attended and participated in group.  Patient was engaged during group and offered feedback to others.  Donne Hazel P 09/13/2017, 5:00 PM

## 2017-09-13 NOTE — Progress Notes (Signed)
Psychoeducational Group Note  Date:  09/13/2017 Time:  2323  Group Topic/Focus:  Wrap-Up Group:   The focus of this group is to help patients review their daily goal of treatment and discuss progress on daily workbooks.  Participation Level: Did Not Attend  Participation Quality:  Not Applicable  Affect:  Not Applicable  Cognitive:  Not Applicable  Insight:  Not Applicable  Engagement in Group: Not Applicable  Additional Comments:  The patient did not attend group this evening since she remained in her bedroom.   Archie Balboa S 09/13/2017, 11:23 PM

## 2017-09-13 NOTE — Progress Notes (Signed)
Pt presents with a flat affect and an anxious mood. Pt reports decreased anxiety and depression. Pt denies SI. Pt reports fair sleep last night. Pt denies any withdrawal symptoms. Pt verbalized readiness to d/c on Friday to [facility]. Pt reports tolerating her meds well and denies any side effects to meds. Medications administered as ordered per MD. Verbal support provided. Pt encouraged to attend groups. 15 minute checks performed for safety. Pt compliant with tx plan.
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
## 2017-09-13 NOTE — Progress Notes (Signed)
Recreation Therapy Notes  Date: 09/13/17 Time: 0930 Location: [address] Dayroom  Group Topic: Stress Management  Goal Area(s) Addresses:  Patient will verbalize importance of using healthy stress management.  Patient will identify positive emotions associated with healthy stress management.   Behavioral Response: Engaged  Intervention: Stress Management  Activity :  Meditation.  LRT read a meditation to help patients visualize the strength and endurance mountains have and how they can use that same strength to get through the things they are faced with.  Education:  Stress Management, Discharge Planning.   Education Outcome: Acknowledges edcuation/In group clarification offered/Needs additional education  Clinical Observations/Feedback: Pt attended group.    Victorino Sparrow, LRT/CTRS         Victorino Sparrow A 09/13/2017 11:16 AM

## 2017-09-14 DIAGNOSIS — M2550 Pain in unspecified joint: Secondary | ICD-10-CM

## 2017-09-14 LAB — URINALYSIS, ROUTINE W REFLEX MICROSCOPIC
BILIRUBIN URINE: NEGATIVE
Glucose, UA: NEGATIVE mg/dL
HGB URINE DIPSTICK: NEGATIVE
Ketones, ur: NEGATIVE mg/dL
Leukocytes, UA: NEGATIVE
NITRITE: NEGATIVE
PROTEIN: NEGATIVE mg/dL
Specific Gravity, Urine: 1.005 (ref 1.005–1.030)
pH: 5 (ref 5.0–8.0)

## 2017-09-14 MED ORDER — LIDOCAINE 5 % EX PTCH: 1.0000 | MEDICATED_PATCH | CUTANEOUS | 0 refills | Status: DC

## 2017-09-14 MED ORDER — HYDROCHLOROTHIAZIDE 25 MG PO TABS: 25.0000 mg | ORAL_TABLET | Freq: Every day | ORAL | 0 refills | Status: DC

## 2017-09-14 MED ORDER — HYDROXYZINE HCL 50 MG PO TABS: 50.0000 mg | ORAL_TABLET | Freq: Four times a day (QID) | ORAL | 0 refills | Status: DC | PRN

## 2017-09-14 MED ORDER — TRAZODONE HCL 100 MG PO TABS: 100.0000 mg | ORAL_TABLET | Freq: Every evening | ORAL | 0 refills | Status: DC | PRN

## 2017-09-14 MED ORDER — LORATADINE 10 MG PO TABS
10.0000 mg | ORAL_TABLET | Freq: Every day | ORAL | 0 refills | Status: DC
Start: 2017-09-15 — End: 2017-11-06

## 2017-09-14 MED ORDER — MIRTAZAPINE 7.5 MG PO TABS: 7.5000 mg | ORAL_TABLET | Freq: Every day | ORAL | 0 refills | Status: DC

## 2017-09-14 MED ORDER — ATENOLOL 25 MG PO TABS: 25.0000 mg | ORAL_TABLET | Freq: Every day | ORAL | 0 refills | Status: DC

## 2017-09-14 MED ORDER — MUSCLE RUB 10-15 % EX CREA: 1.0000 "application " | TOPICAL_CREAM | CUTANEOUS | 0 refills | Status: DC | PRN

## 2017-09-14 MED ORDER — ESCITALOPRAM OXALATE 20 MG PO TABS: 20.0000 mg | ORAL_TABLET | Freq: Every day | ORAL | 0 refills | Status: DC

## 2017-09-14 MED ORDER — PANTOPRAZOLE SODIUM 40 MG PO TBEC: 40.0000 mg | DELAYED_RELEASE_TABLET | Freq: Every day | ORAL | 0 refills | Status: DC

## 2017-09-14 MED ORDER — NYSTATIN 100000 UNIT/GM EX POWD: Freq: Two times a day (BID) | CUTANEOUS | 0 refills | Status: DC | PRN

## 2017-09-14 MED ORDER — PHENYLEPH-SHARK LIV OIL-MO-PET 0.25-3-14-71.9 % RE OINT: TOPICAL_OINTMENT | Freq: Two times a day (BID) | RECTAL | 0 refills | Status: DC | PRN

## 2017-09-14 MED ORDER — GABAPENTIN 400 MG PO CAPS: 400.0000 mg | ORAL_CAPSULE | Freq: Four times a day (QID) | ORAL | 0 refills | Status: DC

## 2017-09-14 MED ORDER — LAMOTRIGINE 25 MG PO TABS: 50.0000 mg | ORAL_TABLET | Freq: Every day | ORAL | 0 refills | Status: DC

## 2017-09-14 MED ORDER — ALBUTEROL SULFATE HFA 108 (90 BASE) MCG/ACT IN AERS: 1.0000 | INHALATION_SPRAY | Freq: Four times a day (QID) | RESPIRATORY_TRACT | Status: DC | PRN

## 2017-09-14 MED ORDER — NICOTINE 21 MG/24HR TD PT24: 21.0000 mg | MEDICATED_PATCH | Freq: Every day | TRANSDERMAL | 0 refills | Status: DC

## 2017-09-14 MED ORDER — FLUTICASONE PROPIONATE 50 MCG/ACT NA SUSP: 2.0000 | Freq: Every day | NASAL | 0 refills | Status: DC

## 2017-09-14 NOTE — Progress Notes (Signed)
Pt has been in her room most of the evening in bed, but did come out to get her night meds and use the phone.  She is still using a walker because she says she is "light-headed and feels unsteady".  She denies SI/HI/AVH.  She is aware that she is probably discharging to [facility] on Friday.  She voices no needs or concerns.  Support and encouragement offered.  Discharge plans are in process.  Safety maintained with q15 minute checks.
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
## 2017-09-14 NOTE — Progress Notes (Signed)
D    Pt reports she is hopeful about her discharge tomorrow to [facility] but anxious at the same time   She said I have social phobia and new situations make me nervous   She said she has her things in her room packed and an outfit laid out  A   Verbal support given    Medications administered and effectiveness monitored    Q 15 min checks    R    Pt is safe at present
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
## 2017-09-14 NOTE — Progress Notes (Signed)
CSW met with pt this morning to confirm that she still wants to go to [facility] in the morning of Friday, 12/28. Pt states that yes, she wants to go for further treatment at [facility]. CSW faxed IVC paperwork to [facility] and contacted [employer] (Education officer, environmental) to notify him of need for transport tomorrow morning.   Maxie Better, MSW, LCSW Clinical Social Worker 09/14/2017 10:57 AM

## 2017-09-14 NOTE — Progress Notes (Signed)
  [facility] Adult Case Management Discharge Plan :  Will you be returning to the same living situation after discharge:  No. Pt accepted to [facility] for Friday, 09/15/17 At discharge, do you have transportation home?: Yes,  PT being IVCed for safe transport to [facility] on Friday, 09/15/17 in the AM. Do you have the ability to pay for your medications: Yes,  [place] [employer]  Release of information consent forms completed and submitted to medical records by CSW.  Patient to Follow up at: [facility].   Why:  You have been accepted for treatment at [facility] for Friday, 09/15/17. Admitting MD: Dr. Ileene Patrick. You will be IVCed for safet transport and [employer] will transport you to the facility. Thank you.  Contact information: [place] [address] [phone]           Next level of care provider has access to [facility] and Suicide Prevention discussed: Yes,  SPE completed with pt; contact attempts made with pt's cousin. SPI pamphlet and Mobile Crisis information provided to pt.   Have you used any form of tobacco in the last 30 days? (Cigarettes, Smokeless Tobacco, Cigars, and/or Pipes): Yes  Has patient been referred to the Quitline?: Patient refused referral  Patient has been referred for addiction treatment: Yes  [employer], LCSW 09/14/2017, 9:11 AM

## 2017-09-14 NOTE — Progress Notes (Signed)
Adult Psychoeducational Group Note  Date:  09/14/2017 Time:  10:04 PM  Group Topic/Focus:  Wrap-Up Group:   The focus of this group is to help patients review their daily goal of treatment and discuss progress on daily workbooks.  Participation Level:  Active  Participation Quality:  Appropriate and Attentive  Affect:  Appropriate  Cognitive:  Alert, Appropriate and Oriented  Insight: Appropriate  Engagement in Group:  Engaged  Modes of Intervention:  Discussion and Education  Additional Comments:  Pt attended and participated in group. Pt stated her goal today was to prepare for her discharge. Pt reported completing her goal and stated that her day was very good until visitation when none of her family came to visit.   [name] 09/14/2017, 10:04 PM

## 2017-09-14 NOTE — Progress Notes (Signed)
[employer] will arrive around 8:15AM to pick up patient for transport per [place], MSW, LCSW Clinical Social Worker 09/14/2017 3:37 PM

## 2017-09-14 NOTE — Progress Notes (Signed)
Patient ID: Deborah Barnes, female   DOB: [birth year], 41 y.o.   MRN: [id number]  D: Patient denies SI/HI and auditory and visual hallucinations.Patient has a depressed sullen look. Her complaints are nausea and pain in her back. She continues to use her walker today.  A: Patient given emotional support from RN. Patient given medications per MD orders. Patient encouraged to attend groups and unit activities. Patient encouraged to come to staff with any questions or concerns.  R: Patient remains cooperative and appropriate. Will continue to monitor patient for safety.

## 2017-09-15 NOTE — Progress Notes (Signed)
Recreation Therapy Notes  Date: 09/15/17 Time: 0930 Location: [address] Dayroom  Group Topic: Stress Management  Goal Area(s) Addresses:  Patient will verbalize importance of using healthy stress management.  Patient will identify positive emotions associated with healthy stress management.   Intervention: Stress Management  Activity :  Body Scan Meditation.  LRT introduced the stress management technique of meditation.  LRT played a meditation from the Calm app that guided them through a body scan to allow them to become aware of any sensations, tensions or uneasy feelings they may be experiencing.  Education:  Stress Management, Discharge Planning.   Education Outcome: Acknowledges edcuation/In group clarification offered/Needs additional education  Clinical Observations/Feedback: Pt did not attend group.    Deborah Barnes, LRT/CTRS         Ria Comment, Antonieta Slaven A 09/15/2017 10:57 AM
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
## 2017-09-15 NOTE — Progress Notes (Signed)
CSW faxed SRA and discharge summary to [facility].  Maxie Better, MSW, LCSW Clinical Social Worker 09/15/2017 10:21 AM

## 2017-09-15 NOTE — Progress Notes (Signed)
Discharge note: Pt received both written and verbal discharge instructions. Pt verbalized understanding of discharge instructions. Pt agreed to f/u  appt at [facility] this morning. Pt agreed to med regimen. Pt received d/c forms, prescriptions and  belongings from room and locker. Pt safely discharged to the lobby to be transported by the [employer].
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
## 2017-11-06 ENCOUNTER — Ambulatory Visit: Payer: Medicaid Other | Admitting: Physician Assistant

## 2017-11-06 ENCOUNTER — Encounter: Payer: Self-pay | Admitting: Physician Assistant

## 2017-11-06 VITALS — BP 130/86 | HR 73 | Temp 98.7°F | Ht 64.17 in | Wt 211.4 lb

## 2017-11-06 DIAGNOSIS — J4520 Mild intermittent asthma, uncomplicated: Secondary | ICD-10-CM | POA: Diagnosis not present

## 2017-11-06 DIAGNOSIS — Z9189 Other specified personal risk factors, not elsewhere classified: Secondary | ICD-10-CM | POA: Diagnosis not present

## 2017-11-06 DIAGNOSIS — K580 Irritable bowel syndrome with diarrhea: Secondary | ICD-10-CM | POA: Diagnosis not present

## 2017-11-06 DIAGNOSIS — M1612 Unilateral primary osteoarthritis, left hip: Secondary | ICD-10-CM | POA: Diagnosis not present

## 2017-11-06 DIAGNOSIS — F314 Bipolar disorder, current episode depressed, severe, without psychotic features: Secondary | ICD-10-CM | POA: Diagnosis not present

## 2017-11-06 DIAGNOSIS — K219 Gastro-esophageal reflux disease without esophagitis: Secondary | ICD-10-CM

## 2017-11-06 MED ORDER — IBUPROFEN 600 MG PO TABS: 600.0000 mg | ORAL_TABLET | Freq: Three times a day (TID) | ORAL | 5 refills | Status: DC | PRN

## 2017-11-06 MED ORDER — ATENOLOL 25 MG PO TABS: 25.0000 mg | ORAL_TABLET | Freq: Every day | ORAL | 11 refills | Status: DC

## 2017-11-06 MED ORDER — FLUTICASONE PROPIONATE 50 MCG/ACT NA SUSP: 2.0000 | Freq: Every day | NASAL | 11 refills | Status: DC

## 2017-11-06 MED ORDER — CITALOPRAM HYDROBROMIDE 20 MG PO TABS: 20.0000 mg | ORAL_TABLET | Freq: Every day | ORAL | 11 refills | Status: DC

## 2017-11-06 MED ORDER — ALBUTEROL SULFATE HFA 108 (90 BASE) MCG/ACT IN AERS: 1.0000 | INHALATION_SPRAY | Freq: Four times a day (QID) | RESPIRATORY_TRACT | 11 refills | Status: DC | PRN

## 2017-11-06 MED ORDER — PANTOPRAZOLE SODIUM 40 MG PO TBEC: 40.0000 mg | DELAYED_RELEASE_TABLET | Freq: Every day | ORAL | 11 refills | Status: DC

## 2017-11-06 MED ORDER — LORATADINE 10 MG PO TABS: 10.0000 mg | ORAL_TABLET | Freq: Every day | ORAL | 11 refills | Status: DC

## 2017-11-06 MED ORDER — HYDROCHLOROTHIAZIDE 25 MG PO TABS: 25.0000 mg | ORAL_TABLET | Freq: Every day | ORAL | 3 refills | Status: DC

## 2017-11-06 MED ORDER — HYDROCODONE-ACETAMINOPHEN 10-325 MG PO TABS: 1.0000 | ORAL_TABLET | Freq: Four times a day (QID) | ORAL | 0 refills | Status: DC | PRN
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
## 2017-11-06 NOTE — Patient Instructions (Signed)
In a few days you may receive a survey in the mail or online from [employer] regarding your visit with us today. Please take a moment to fill this out. Your feedback is very important to our whole office. It can help us better understand your needs as well as improve your experience and satisfaction. Thank you for taking your time to complete it. We care about you.  Athens Lebeau, PA-C  

## 2017-11-13 ENCOUNTER — Other Ambulatory Visit: Payer: Self-pay | Admitting: Physician Assistant

## 2017-12-01 ENCOUNTER — Other Ambulatory Visit: Payer: Self-pay | Admitting: Physician Assistant

## 2017-12-01 DIAGNOSIS — M1612 Unilateral primary osteoarthritis, left hip: Secondary | ICD-10-CM

## 2018-01-01 ENCOUNTER — Encounter: Payer: Self-pay | Admitting: *Deleted

## 2018-01-04 ENCOUNTER — Telehealth: Payer: Self-pay | Admitting: Physician Assistant

## 2018-01-04 NOTE — Telephone Encounter (Signed)
ALSO Call [phone]

## 2018-01-04 NOTE — Telephone Encounter (Signed)
Patient was calling to ask for a refill on her hydrocodone. Patient was advised that she would have to be seen. She was notified that we can not fill a controlled substance outside of an office visit.
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
## 2018-01-25 ENCOUNTER — Encounter: Payer: Self-pay | Admitting: Physician Assistant

## 2018-01-25 ENCOUNTER — Ambulatory Visit: Payer: Medicaid Other | Admitting: Physician Assistant

## 2018-01-25 VITALS — BP 114/71 | HR 70 | Temp 97.9°F | Ht 64.25 in | Wt 214.2 lb

## 2018-01-25 DIAGNOSIS — Z86010 Personal history of colonic polyps: Secondary | ICD-10-CM

## 2018-01-25 DIAGNOSIS — R350 Frequency of micturition: Secondary | ICD-10-CM

## 2018-01-25 DIAGNOSIS — J449 Chronic obstructive pulmonary disease, unspecified: Secondary | ICD-10-CM

## 2018-01-25 DIAGNOSIS — Z8639 Personal history of other endocrine, nutritional and metabolic disease: Secondary | ICD-10-CM

## 2018-01-25 DIAGNOSIS — E669 Obesity, unspecified: Secondary | ICD-10-CM

## 2018-01-25 DIAGNOSIS — M1612 Unilateral primary osteoarthritis, left hip: Secondary | ICD-10-CM

## 2018-01-25 DIAGNOSIS — Z8619 Personal history of other infectious and parasitic diseases: Secondary | ICD-10-CM

## 2018-01-25 DIAGNOSIS — E041 Nontoxic single thyroid nodule: Secondary | ICD-10-CM

## 2018-01-25 DIAGNOSIS — F39 Unspecified mood [affective] disorder: Secondary | ICD-10-CM

## 2018-01-25 DIAGNOSIS — F17219 Nicotine dependence, cigarettes, with unspecified nicotine-induced disorders: Secondary | ICD-10-CM

## 2018-01-25 DIAGNOSIS — Z7689 Persons encountering health services in other specified circumstances: Secondary | ICD-10-CM

## 2018-01-25 DIAGNOSIS — F1910 Other psychoactive substance abuse, uncomplicated: Secondary | ICD-10-CM

## 2018-01-25 LAB — POCT URINALYSIS DIPSTICK
Bilirubin, UA: NEGATIVE
GLUCOSE UA: NEGATIVE
KETONES UA: NEGATIVE
LEUKOCYTES UA: NEGATIVE
NITRITE UA: NEGATIVE
PH UA: 7 (ref 5.0–8.0)
Protein, UA: NEGATIVE
RBC UA: NEGATIVE
Spec Grav, UA: <=1.005 — AB (ref 1.010–1.025)
Urobilinogen, UA: 0.2 E.U./dL
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
## 2018-01-26 ENCOUNTER — Other Ambulatory Visit (HOSPITAL_COMMUNITY)
Admission: RE | Admit: 2018-01-26 | Discharge: 2018-01-26 | Disposition: A | Discharge status: Home / Self Care | Payer: Self-pay | Source: Ambulatory Visit | Attending: Physician Assistant | Admitting: Physician Assistant

## 2018-01-26 DIAGNOSIS — Z8639 Personal history of other endocrine, nutritional and metabolic disease: Secondary | ICD-10-CM | POA: Insufficient documentation

## 2018-01-26 DIAGNOSIS — Z8619 Personal history of other infectious and parasitic diseases: Secondary | ICD-10-CM | POA: Insufficient documentation

## 2018-01-26 LAB — COMPREHENSIVE METABOLIC PANEL
ALK PHOS: 86 U/L (ref 38–126)
ALT: 237 U/L — AB (ref 14–54)
AST: 98 U/L — AB (ref 15–41)
Albumin: 3.8 g/dL (ref 3.5–5.0)
Anion gap: 7 (ref 5–15)
BILIRUBIN TOTAL: 0.7 mg/dL (ref 0.3–1.2)
BUN: 9 mg/dL (ref 6–20)
CALCIUM: 9 mg/dL (ref 8.9–10.3)
CO2: 28 mmol/L (ref 22–32)
CREATININE: 0.57 mg/dL (ref 0.44–1.00)
Chloride: 104 mmol/L (ref 101–111)
GFR calc Af Amer: >60 mL/min (ref >60)
Glucose, Bld: 79 mg/dL (ref 65–99)
Potassium: 4.2 mmol/L (ref 3.5–5.1)
Sodium: 139 mmol/L (ref 135–145)
TOTAL PROTEIN: 7.4 g/dL (ref 6.5–8.1)

## 2018-01-27 LAB — HCV RNA QUANT
HCV QUANT LOG: 5.279 {Log_IU}/mL (ref >1.70)
HCV QUANT: 190000 [IU]/mL (ref >50)

## 2018-01-29 ENCOUNTER — Encounter: Payer: Self-pay | Admitting: Physician Assistant

## 2018-01-29 ENCOUNTER — Other Ambulatory Visit: Payer: Self-pay | Admitting: Physician Assistant

## 2018-01-29 MED ORDER — METOPROLOL TARTRATE 50 MG PO TABS: 50.0000 mg | ORAL_TABLET | Freq: Two times a day (BID) | ORAL | 1 refills | Status: DC

## 2018-01-31 ENCOUNTER — Telehealth: Payer: Self-pay

## 2018-01-31 NOTE — Telephone Encounter (Signed)
Called to follow up with client after her free clinic appointment to establish care. Called [facility]. Left message for client to return call.  Mayra Reel RN [employer] [phone]

## 2018-02-06 ENCOUNTER — Telehealth: Payer: Self-pay

## 2018-02-06 NOTE — Telephone Encounter (Signed)
Called to follow up with client, who is now residing at [facility]. She states her appointment with the [facility] went well and she is doing well adjusting at [facility]. No further needs at present. Will follow as needed.

## 2018-02-26 ENCOUNTER — Ambulatory Visit: Payer: Medicaid Other | Admitting: Physician Assistant

## 2018-02-26 ENCOUNTER — Encounter: Payer: Self-pay | Admitting: Physician Assistant

## 2018-02-26 VITALS — BP 110/66 | HR 73 | Temp 98.8°F | Ht 64.25 in | Wt 228.8 lb

## 2018-02-26 DIAGNOSIS — M545 Low back pain: Secondary | ICD-10-CM

## 2018-02-26 DIAGNOSIS — B182 Chronic viral hepatitis C: Secondary | ICD-10-CM

## 2018-02-26 DIAGNOSIS — G8929 Other chronic pain: Secondary | ICD-10-CM

## 2018-02-26 DIAGNOSIS — M25551 Pain in right hip: Secondary | ICD-10-CM

## 2018-02-26 DIAGNOSIS — F39 Unspecified mood [affective] disorder: Secondary | ICD-10-CM

## 2018-02-26 DIAGNOSIS — H00014 Hordeolum externum left upper eyelid: Secondary | ICD-10-CM

## 2018-02-26 DIAGNOSIS — E041 Nontoxic single thyroid nodule: Secondary | ICD-10-CM

## 2018-02-26 DIAGNOSIS — K219 Gastro-esophageal reflux disease without esophagitis: Secondary | ICD-10-CM

## 2018-02-26 DIAGNOSIS — E669 Obesity, unspecified: Secondary | ICD-10-CM

## 2018-02-26 DIAGNOSIS — R945 Abnormal results of liver function studies: Secondary | ICD-10-CM

## 2018-02-26 DIAGNOSIS — R7989 Other specified abnormal findings of blood chemistry: Secondary | ICD-10-CM

## 2018-02-26 DIAGNOSIS — F17219 Nicotine dependence, cigarettes, with unspecified nicotine-induced disorders: Secondary | ICD-10-CM

## 2018-02-26 DIAGNOSIS — I10 Essential (primary) hypertension: Secondary | ICD-10-CM

## 2018-02-26 DIAGNOSIS — R350 Frequency of micturition: Secondary | ICD-10-CM

## 2018-02-26 MED ORDER — OMEPRAZOLE 40 MG PO CPDR: 40.0000 mg | DELAYED_RELEASE_CAPSULE | Freq: Every day | ORAL | 1 refills | Status: DC

## 2018-02-26 NOTE — Patient Instructions (Signed)

## 2018-02-26 NOTE — Progress Notes (Signed)
BP 110/66 (BP Location: Left Arm, Patient Position: Sitting, Cuff Size: Normal)   Pulse 73   Temp 98.8 F (37.1 C)   Ht 5' 4.25" (1.632 m)   Wt 228 lb 12 oz (103.8 kg)   SpO2 98%   BMI 38.96 kg/m    Subjective:    Patient ID: Deborah Barnes, female    DOB: [birth year], 42 y.o.   MRN: [id number]  HPI: Deborah Barnes is a 42 y.o. female presenting on 02/26/2018 for Follow-up   HPI  Pt seen here 1 month ago to establish care.  At that time, she was hoping to move into [facility] house.  She is now at [facility] and is doing well there.   She was referred to endocrinology for thyroid nodule.  She has appointment there next week.   Her hctz was discontinue due to urinary frequency and she was started on atenolol.  Pt aware that it is not available through [facility] so she will be switched to metoprolol when she gets her meds from [facility].  She received the metoprolol and is doing well on it.  Pt is continuing with [facility]  Pt has turned in her [facility] application  Pt takes the NSAIDS for hip and back pain.   Pt c/o heartburn.   Pt still with R hip and back pain.  Pt had CT hip in December which showed degenerative changes and linear lucencies .  She urinates more than once/hour.  No burning or pain.   She drinks mostly sweet tea.    Pt complains of bump on eyelid that is mildly sore and she wonders if it is a stye.  She does not wear contact lenses and she is having no changes in vision.   Relevant past medical, surgical, family and social history reviewed and updated as indicated. Interim medical history since our last visit reviewed. Allergies and medications reviewed and updated.   Current Outpatient Medications:  .  albuterol (PROVENTIL HFA;VENTOLIN HFA) 108 (90 Base) MCG/ACT inhaler, Inhale 1-2 puffs into the lungs every 6 (six) hours as needed for wheezing or shortness of breath., Disp: 8 g, Rfl: 11 .  citalopram (CELEXA) 20 MG tablet, Take 1 tablet (20  mg total) by mouth daily., Disp: 30 tablet, Rfl: 11 .  ibuprofen (ADVIL,MOTRIN) 200 MG tablet, Take 200 mg by mouth every 8 (eight) hours as needed., Disp: , Rfl:  .  loratadine (CLARITIN) 10 MG tablet, Take 1 tablet (10 mg total) by mouth daily. For allergies, Disp: 30 tablet, Rfl: 11 .  metoprolol tartrate (LOPRESSOR) 50 MG tablet, Take 1 tablet (50 mg total) by mouth 2 (two) times daily., Disp: 180 tablet, Rfl: 1 .  Naproxen Sodium (ALEVE PO), Take 2 tablets by mouth daily., Disp: , Rfl:  .  Simethicone (GAS RELIEF PO), Take 1 tablet by mouth daily., Disp: , Rfl:  .  traZODone (DESYREL) 100 MG tablet, Take 1 tablet (100 mg total) by mouth at bedtime as needed for sleep., Disp: 30 tablet, Rfl: 0   Review of Systems  Constitutional: Positive for diaphoresis and fatigue. Negative for appetite change, chills, fever and unexpected weight change.  HENT: Positive for dental problem, facial swelling, sneezing and trouble swallowing. Negative for congestion, drooling, ear pain, hearing loss, mouth sores, sore throat and voice change.   Eyes: Positive for pain, discharge, redness, itching and visual disturbance.  Respiratory: Positive for choking. Negative for cough, shortness of breath and wheezing.   Cardiovascular: Positive  for leg swelling. Negative for chest pain and palpitations.  Gastrointestinal: Positive for abdominal pain, constipation, diarrhea and vomiting. Negative for blood in stool.  Endocrine: Positive for heat intolerance. Negative for cold intolerance and polydipsia.  Genitourinary: Negative for decreased urine volume, dysuria and hematuria.  Musculoskeletal: Positive for arthralgias, back pain and gait problem.  Skin: Negative for rash.  Allergic/Immunologic: Positive for environmental allergies.  Neurological: Positive for light-headedness and headaches. Negative for seizures and syncope.  Hematological: Negative for adenopathy.  Psychiatric/Behavioral: Positive for dysphoric  mood. Negative for agitation and suicidal ideas. The patient is nervous/anxious.     Per HPI unless specifically indicated above     Objective:    BP 110/66 (BP Location: Left Arm, Patient Position: Sitting, Cuff Size: Normal)   Pulse 73   Temp 98.8 F (37.1 C)   Ht 5' 4.25" (1.632 m)   Wt 228 lb 12 oz (103.8 kg)   SpO2 98%   BMI 38.96 kg/m   Wt Readings from Last 3 Encounters:  02/26/18 228 lb 12 oz (103.8 kg)  01/25/18 214 lb 4 oz (97.2 kg)  01/24/18 214 lb 12.8 oz (97.4 kg)    Physical Exam  Constitutional: She is oriented to person, place, and time. She appears well-developed and well-nourished.  HENT:  Head: Normocephalic and atraumatic.  Mouth/Throat: Oropharynx is clear and moist. No oropharyngeal exudate.  Eyes: Pupils are equal, round, and reactive to light. Conjunctivae and EOM are normal. Right eye exhibits no discharge and no hordeolum. Left eye exhibits hordeolum. Left eye exhibits no discharge.  Stye upper L eyelid.  Nontender. No discharge  Neck: Neck supple. No thyromegaly present.  Cardiovascular: Normal rate and regular rhythm.  Pulmonary/Chest: Effort normal and breath sounds normal.  Abdominal: Soft. Bowel sounds are normal. She exhibits no mass. There is no hepatosplenomegaly. There is no tenderness.  Musculoskeletal: She exhibits no edema.  Lymphadenopathy:    She has no cervical adenopathy.  Neurological: She is alert and oriented to person, place, and time. Gait normal.  Skin: Skin is warm and dry.  Psychiatric: She has a normal mood and affect. Her behavior is normal.  Vitals reviewed.   Results for orders placed or performed during the hospital encounter of 01/26/18  Comprehensive metabolic panel  Result Value Ref Range   Sodium 139 135 - 145 mmol/L   Potassium 4.2 3.5 - 5.1 mmol/L   Chloride 104 101 - 111 mmol/L   CO2 28 22 - 32 mmol/L   Glucose, Bld 79 65 - 99 mg/dL   BUN 9 6 - 20 mg/dL   Creatinine, Ser 0.57 0.44 - 1.00 mg/dL   Calcium  9.0 8.9 - 10.3 mg/dL   Total Protein 7.4 6.5 - 8.1 g/dL   Albumin 3.8 3.5 - 5.0 g/dL   AST 98 (H) 15 - 41 U/L   ALT 237 (H) 14 - 54 U/L   Alkaline Phosphatase 86 38 - 126 U/L   Total Bilirubin 0.7 0.3 - 1.2 mg/dL   GFR calc non Af Amer >60 >60 mL/min   GFR calc Af Amer >60 >60 mL/min   Anion gap 7 5 - 15  HCV RNA quant  Result Value Ref Range   HCV Quantitative 190,000 >50 IU/mL   HCV Quantitative Log 5.279 >1.70 log10 IU/mL   Test Information Comment       Assessment & Plan:   Encounter Diagnoses  Name Primary?  . Chronic hepatitis C without hepatic coma ([place]) Yes  . Elevated  LFTs   . Right hip pain   . Chronic midline low back pain, with sciatica presence unspecified   . Essential hypertension   . Thyroid nodule   . Gastroesophageal reflux disease, esophagitis presence not specified   . Urinary frequency   . Hordeolum externum of left upper eyelid   . Mood disorder ([place])   . Cigarette nicotine dependence with nicotine-induced disorder   . Obesity, unspecified classification, unspecified obesity type, unspecified whether serious comorbidity present     -reviewed labs with pt -pt to continue metoprolol.  While her urinary frequency persists, her bp is great -refer to GI for hepatitis C -pt to go to endocrinology as scheduled -add omeprazole from [facility] -refer orthopedist- R hip and back pain -discussed with pt that her stomach will continue to be irritated and painful due to her daily use of NSAIDS.  She says she has to have it due to her joint pain.  Counseled pt to use with food and skip doses when she doesn't need it.   -pt to keep log of what she drinks and when she goes to the bathroom to urinate due to her persistent complaints of urinary frequency  -warm compresses to stye -follow up 1 month to review urinary frequency

## 2018-02-27 ENCOUNTER — Encounter: Payer: Self-pay | Admitting: Gastroenterology

## 2018-03-06 ENCOUNTER — Ambulatory Visit (INDEPENDENT_AMBULATORY_CARE_PROVIDER_SITE_OTHER): Payer: Self-pay | Admitting: "Endocrinology

## 2018-03-06 ENCOUNTER — Encounter: Payer: Self-pay | Admitting: "Endocrinology

## 2018-03-06 ENCOUNTER — Other Ambulatory Visit (HOSPITAL_COMMUNITY)
Admission: RE | Admit: 2018-03-06 | Discharge: 2018-03-06 | Disposition: A | Discharge status: Home / Self Care | Payer: Medicaid Other | Source: Ambulatory Visit | Attending: "Endocrinology | Admitting: "Endocrinology

## 2018-03-06 VITALS — BP 115/77 | HR 76 | Ht 64.25 in | Wt 225.0 lb

## 2018-03-06 DIAGNOSIS — E049 Nontoxic goiter, unspecified: Secondary | ICD-10-CM | POA: Insufficient documentation

## 2018-03-06 LAB — T4, FREE: FREE T4: 0.79 ng/dL — AB (ref 0.82–1.77)

## 2018-03-06 LAB — TSH: TSH: 1.189 u[IU]/mL (ref 0.350–4.500)

## 2018-03-06 NOTE — Progress Notes (Signed)
Endocrinology Consult Note                                            03/06/2018, 3:09 PM   Subjective:    Patient ID: Deborah Barnes, female    DOB: [birth year], PCP Soyla Dryer, PA-C   Past Medical History:  Diagnosis Date  . Asthma   . Back pain   . Bipolar 1 disorder ([place])   . Bulging disc   . COPD (chronic obstructive pulmonary disease) ([place])   . Degenerative disc disease   . Depression   . Depression with anxiety   . GERD (gastroesophageal reflux disease)   . Hepatitis C 08/2017  . Hypercholesterolemia   . Hypertension   . PTSD (post-traumatic stress disorder)   . Sciatica   . Shortness of breath    Past Surgical History:  Procedure Laterality Date  . BIOPSY N/A 09/25/2014   Procedure: BIOPSY;  Surgeon: Daneil Dolin, MD;  Location: [facility];  Service: Endoscopy;  Laterality: N/A;  Gastric, Ascending Colon, Descending/Sigmoid Colon   . CARPAL TUNNEL RELEASE Bilateral   . CHOLECYSTECTOMY N/A 04/26/2013   Procedure: LAPAROSCOPIC CHOLECYSTECTOMY;  Surgeon: Jamesetta So, MD;  Location: [facility];  Service: General;  Laterality: N/A;  . COLONOSCOPY WITH PROPOFOL N/A 09/25/2014   RMR: Pancolonic diverticulosis. Multiple colonic polyps removed as described above. Status post segmental bisopsy.  . ESOPHAGOGASTRODUODENOSCOPY (EGD) WITH PROPOFOL N/A 09/25/2014   RMR: Normal esophagus. Abnormal gastric mucosa status post biopsy. Hiatal hernia.   Fatima Blank HERNIA REPAIR N/A 10/04/2013   Procedure: HERNIA REPAIR INCISIONAL WITH MESH;  Surgeon: Jamesetta So, MD;  Location: [facility];  Service: General;  Laterality: N/A;  . INSERTION OF MESH N/A 10/04/2013   Procedure: INSERTION OF MESH;  Surgeon: Jamesetta So, MD;  Location: [facility];  Service: General;  Laterality: N/A;  . POLYPECTOMY N/A 09/25/2014   Procedure: POLYPECTOMY;  Surgeon: Daneil Dolin, MD;  Location: [facility];  Service: Endoscopy;  Laterality: N/A;  Cecal, Ascending Colon   . SHOULDER SURGERY    . TUBAL LIGATION      Social History   Socioeconomic History  . Marital status: Divorced    Spouse name: Not on file  . Number of children: Not on file  . Years of education: Not on file  . Highest education level: Not on file  Occupational History  . Not on file  Social Needs  . Financial resource strain: Not on file  . Food insecurity:    Worry: Not on file    Inability: Not on file  . Transportation needs:    Medical: Not on file    Non-medical: Not on file  Tobacco Use  . Smoking status: Current Every Day Smoker    Packs/day: 0.75    Years: 27.00    Pack years: 20.25    Types: Cigarettes  . Smokeless tobacco: Never Used  Substance and Sexual Activity  . Alcohol use: No    Alcohol/week: 0.6 oz    Types: 1 Cans of beer per week    Frequency: Never    Comment: hx binge drinker. none since 12/16/17  . Drug use: Not Currently    Types: Marijuana, Cocaine, Methamphetamines, Heroin    Comment: no cocaine, heroin, meth since 11-27-2017. last marijuana use 01-21-18  . Sexual activity: Yes  Birth control/protection: Surgical  Lifestyle  . Physical activity:    Days per week: Not on file    Minutes per session: Not on file  . Stress: Not on file  Relationships  . Social connections:    Talks on phone: Not on file    Gets together: Not on file    Attends religious service: Not on file    Active member of club or organization: Not on file    Attends meetings of clubs or organizations: Not on file    Relationship status: Not on file  Other Topics Concern  . Not on file  Social History Narrative  . Not on file   Outpatient Encounter Medications as of 03/06/2018  Medication Sig  . albuterol (PROVENTIL HFA;VENTOLIN HFA) 108 (90 Base) MCG/ACT inhaler Inhale 1-2 puffs into the lungs every 6 (six) hours as needed for wheezing or shortness of breath.  . citalopram (CELEXA) 20 MG tablet Take 1 tablet (20 mg total) by mouth daily.  Marland Kitchen ibuprofen (ADVIL,MOTRIN) 200 MG tablet Take 200 mg by mouth  every 8 (eight) hours as needed.  . loratadine (CLARITIN) 10 MG tablet Take 1 tablet (10 mg total) by mouth daily. For allergies  . metoprolol tartrate (LOPRESSOR) 50 MG tablet Take 1 tablet (50 mg total) by mouth 2 (two) times daily.  . Naproxen Sodium (ALEVE PO) Take 2 tablets by mouth daily.  Marland Kitchen omeprazole (PRILOSEC) 40 MG capsule Take 1 capsule (40 mg total) by mouth daily.  . Simethicone (GAS RELIEF PO) Take 1 tablet by mouth daily.  . traZODone (DESYREL) 100 MG tablet Take 1 tablet (100 mg total) by mouth at bedtime as needed for sleep.   No facility-administered encounter medications on file as of 03/06/2018.    ALLERGIES: Allergies  Allergen Reactions  . Clarithromycin Diarrhea and Other (See Comments)    Diarrhea and abd pain  . Propoxyphene Itching    VACCINATION STATUS: Immunization History  Administered Date(s) Administered  . Pneumococcal Polysaccharide-23 04/26/2013    HPI Deborah Barnes is 42 y.o. female who presents today with a medical history as above. she is being seen in consultation for incidental thyroid nodule during CT scan of the head and neck  requested by Soyla Dryer, PA-C.   -She denies any prior history of thyroid dysfunction.  Denies family history of thyroid malignancy.  Denies dysphagia, odynophagia, voice change.  She is a chronic heavy smoker. -She denies heat/cold intolerance.  Review of Systems  Constitutional: no weight gain/loss, no fatigue, no subjective hyperthermia, no subjective hypothermia Eyes: no blurry vision, no xerophthalmia ENT: no sore throat, no nodules palpated in throat, no dysphagia/odynophagia, no hoarseness Cardiovascular: no Chest Pain, no Shortness of Breath, no palpitations, no leg swelling Respiratory: no cough, no SOB Gastrointestinal: no Nausea/Vomiting/Diarhhea Musculoskeletal:  + Walks with a cane due to use arthritis, + muscle/joint aches Skin: no rashes Neurological: no tremors, no numbness, no tingling,  no dizziness Psychiatric: no depression, no anxiety  Objective:    BP 115/77   Pulse 76   Ht 5' 4.25" (1.632 m)   Wt 225 lb (102.1 kg)   BMI 38.32 kg/m   Wt Readings from Last 3 Encounters:  03/06/18 225 lb (102.1 kg)  02/26/18 228 lb 12 oz (103.8 kg)  01/25/18 214 lb 4 oz (97.2 kg)    Physical Exam   Constitutional:  + Obese for height, not in acute distress, normal state of mind Eyes: PERRLA, EOMI, no exophthalmos ENT: moist mucous membranes,  no thyromegaly, no cervical lymphadenopathy Cardiovascular: normal precordial activity, Regular Rate and Rhythm, no Murmur/Rubs/Gallops Respiratory:  adequate breathing efforts, no gross chest deformity, Clear to auscultation bilaterally Gastrointestinal: abdomen soft, Non -tender, No distension, Bowel Sounds present Musculoskeletal: + Walks with a cane, strength intact in all four extremities Skin: moist, warm, no rashes Neurological: no tremor with outstretched hands, Deep tendon reflexes normal in all four extremities.  CMP ( most recent) CMP     Component Value Date/Time   NA 139 01/26/2018 0956   K 4.2 01/26/2018 0956   CL 104 01/26/2018 0956   CO2 28 01/26/2018 0956   GLUCOSE 79 01/26/2018 0956   BUN 9 01/26/2018 0956   CREATININE 0.57 01/26/2018 0956   CALCIUM 9.0 01/26/2018 0956   PROT 7.4 01/26/2018 0956   ALBUMIN 3.8 01/26/2018 0956   AST 98 (H) 01/26/2018 0956   ALT 237 (H) 01/26/2018 0956   ALKPHOS 86 01/26/2018 0956   BILITOT 0.7 01/26/2018 0956   GFRNONAA >60 01/26/2018 0956   GFRAA >60 01/26/2018 0956     Diabetic Labs (most recent): Lab Results  Component Value Date   HGBA1C 5.1 09/05/2017     Lipid Panel ( most recent) Lipid Panel     Component Value Date/Time   CHOL 160 09/05/2017 0621   TRIG 137 09/05/2017 0621   HDL 56 09/05/2017 0621   CHOLHDL 2.9 09/05/2017 0621   VLDL 27 09/05/2017 0621   LDLCALC 77 09/05/2017 0621      Lab Results  Component Value Date   TSH 2.082 09/05/2017     December 18, 2017 CT head: 13 mm nodule on the right lobe of the thyroid.    Assessment & Plan:   1. Nodular goiter - Deborah Barnes  is being seen at a kind request of Soyla Dryer, PA-C. - I have reviewed her available thyroid records and clinically evaluated the patient. 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  - she will need a dedicated thyroid ultrasound and full profile thyroid function tests  towards confirming the diagnosis.  -she will return in 2 week to review her repeat labs.   If her  labs are suggestive of significant nodular lesions, she will be considered for fine-needle aspiration.  - I did not initiate any new prescriptions today. -She is advised for smoking cessation. - I advised her  to maintain close follow up with Soyla Dryer, PA-C for primary care needs.  Follow up plan: Return in about 2 weeks (around 03/20/2018) for labs today, Thyroid / Neck Ultrasound.   Glade Lloyd, MD [facility] [address] [place],  [address] Phone: [phone]  Fax: [phone]     03/06/2018, 3:09 PM  This note was partially dictated with voice recognition software. Similar sounding words can be transcribed inadequately or may not  be corrected upon review.

## 2018-03-07 LAB — THYROID PEROXIDASE ANTIBODY: THYROID PEROXIDASE ANTIBODY: 31 [IU]/mL (ref 0–34)

## 2018-03-07 LAB — THYROGLOBULIN ANTIBODY: Thyroglobulin Antibody: <1 IU/mL (ref 0.0–0.9)

## 2018-03-07 LAB — T3, FREE: T3 FREE: 3.2 pg/mL (ref 2.0–4.4)

## 2018-03-19 ENCOUNTER — Encounter: Payer: Self-pay | Admitting: *Deleted

## 2018-03-19 ENCOUNTER — Ambulatory Visit (INDEPENDENT_AMBULATORY_CARE_PROVIDER_SITE_OTHER): Payer: Self-pay | Admitting: Gastroenterology

## 2018-03-19 ENCOUNTER — Encounter: Payer: Self-pay | Admitting: Gastroenterology

## 2018-03-19 ENCOUNTER — Telehealth: Payer: Self-pay | Admitting: *Deleted

## 2018-03-19 VITALS — BP 112/74 | HR 68 | Temp 98.6°F | Ht 64.0 in | Wt 233.4 lb

## 2018-03-19 DIAGNOSIS — B182 Chronic viral hepatitis C: Secondary | ICD-10-CM
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
## 2018-03-19 NOTE — Patient Instructions (Signed)
Please go to the lab when you are able.   We have also ordered special imaging of your liver.  I will try to get the results from January 2019. This way, we won't have to wait another 4 months prior to treatment.  I am very proud of you! Keep up the good work!  It was a pleasure to see you today. I strive to create trusting relationships with patients to provide genuine, compassionate, and quality care. I value your feedback. If you receive a survey regarding your visit,  I greatly appreciate you taking time to fill this out.   Annitta Needs, PhD, ANP-BC [facility]

## 2018-03-19 NOTE — Telephone Encounter (Signed)
Checked [employer] website for PA on [place] complete with electrography. Received message "NOTE: This [facility] member does not require prior authorization for OUTPATIENT Radiology through [place] or [place] [facility] at this time."

## 2018-03-20 NOTE — Progress Notes (Signed)
CC'ED TO PCP 

## 2018-03-20 NOTE — Assessment & Plan Note (Signed)
42 year old female with history of chronic Hep C. It is difficult to determine exactly when she contracted this, but she tells me she was informed of a positive Hep C antibody in Jan 2019. IF this is the case, then it has been approximately 6 months and has not cleared virus on own. If we are able to document positive hep C antibody from Jan 2019 (requesting from inpatient treatment program), then we can consider pursuing treatment in the near future. Need to assess Hep B status. HIV on file negative. She is in a treatment facility with good support network [facility]), and she is attending therapy multiple times a week. Highly motivated individual. It is encouraging she will be at the [facility] for at most a year, which is her plan. Obtain updated labs today. Pursue elastography. Further recommendations to follow pending review of labs.

## 2018-03-21 ENCOUNTER — Ambulatory Visit (INDEPENDENT_AMBULATORY_CARE_PROVIDER_SITE_OTHER): Payer: Medicaid Other | Admitting: "Endocrinology

## 2018-03-21 ENCOUNTER — Encounter: Payer: Self-pay | Admitting: "Endocrinology

## 2018-03-21 VITALS — BP 119/78 | HR 66 | Ht 64.0 in | Wt 232.0 lb

## 2018-03-21 DIAGNOSIS — E049 Nontoxic goiter, unspecified: Secondary | ICD-10-CM

## 2018-03-21 DIAGNOSIS — E039 Hypothyroidism, unspecified: Secondary | ICD-10-CM | POA: Insufficient documentation

## 2018-03-21 MED ORDER — LEVOTHYROXINE SODIUM 25 MCG PO TABS: 25.0000 ug | ORAL_TABLET | Freq: Every day | ORAL | 3 refills | Status: DC

## 2018-03-21 NOTE — Progress Notes (Signed)
Endocrinology follow-up note                                            03/21/2018, 4:57 PM   Subjective:    Patient ID: Meryl Crutch, female    DOB: [birth year], PCP Soyla Dryer, PA-C   Past Medical History:  Diagnosis Date  . Asthma   . Back pain   . Bulging disc   . COPD (chronic obstructive pulmonary disease) ([place])   . Degenerative disc disease   . Depression   . Depression with anxiety   . GERD (gastroesophageal reflux disease)   . Hepatitis C 08/2017  . Hypercholesterolemia   . Hypertension   . PTSD (post-traumatic stress disorder)   . Sciatica   . Shortness of breath    Past Surgical History:  Procedure Laterality Date  . BIOPSY N/A 09/25/2014   Procedure: BIOPSY;  Surgeon: Daneil Dolin, MD;  Location: [facility];  Service: Endoscopy;  Laterality: N/A;  Gastric, Ascending Colon, Descending/Sigmoid Colon   . CARPAL TUNNEL RELEASE Bilateral   . CHOLECYSTECTOMY N/A 04/26/2013   Procedure: LAPAROSCOPIC CHOLECYSTECTOMY;  Surgeon: Jamesetta So, MD;  Location: [facility];  Service: General;  Laterality: N/A;  . COLONOSCOPY WITH PROPOFOL N/A 09/25/2014   RMR: Pancolonic diverticulosis. multiple tubular adenomas, segmental biopsies negative. surveillance in 5 years  . ESOPHAGOGASTRODUODENOSCOPY (EGD) WITH PROPOFOL N/A 09/25/2014   RMR: Normal esophagus. Abnormal gastric mucosa status post biopsy (negative H.pylori). Hiatal hernia.   Fatima Blank HERNIA REPAIR N/A 10/04/2013   Procedure: HERNIA REPAIR INCISIONAL WITH MESH;  Surgeon: Jamesetta So, MD;  Location: [facility];  Service: General;  Laterality: N/A;  . INSERTION OF MESH N/A 10/04/2013   Procedure: INSERTION OF MESH;  Surgeon: Jamesetta So, MD;  Location: [facility];  Service: General;  Laterality: N/A;  . POLYPECTOMY N/A 09/25/2014   Procedure: POLYPECTOMY;  Surgeon: Daneil Dolin, MD;  Location: [facility];  Service: Endoscopy;  Laterality: N/A;  Cecal, Ascending Colon   . SHOULDER SURGERY    . TUBAL LIGATION      Social History   Socioeconomic History  . Marital status: Divorced    Spouse name: Not on file  . Number of children: Not on file  . Years of education: Not on file  . Highest education level: Not on file  Occupational History  . Not on file  Social Needs  . Financial resource strain: Not on file  . Food insecurity:    Worry: Not on file    Inability: Not on file  . Transportation needs:    Medical: Not on file    Non-medical: Not on file  Tobacco Use  . Smoking status: Current Every Day Smoker    Packs/day: 0.75    Years: 27.00    Pack years: 20.25    Types: Cigarettes  . Smokeless tobacco: Never Used  Substance and Sexual Activity  . Alcohol use: No    Alcohol/week: 0.6 oz    Types: 1 Cans of beer per week    Frequency: Never    Comment: hx binge drinker. none since 12/16/17  . Drug use: Not Currently    Types: Marijuana, Cocaine, Methamphetamines, Heroin    Comment: no cocaine, heroin, meth since 11-27-2017. last marijuana use 01-21-18  . Sexual activity: Yes    Birth control/protection: Surgical  Lifestyle  . Physical activity:    Days per week: Not on file    Minutes per session: Not on file  . Stress: Not on file  Relationships  . Social connections:    Talks on phone: Not on file    Gets together: Not on file    Attends religious service: Not on file    Active member of club or organization: Not on file    Attends meetings of clubs or organizations: Not on file    Relationship status: Not on file  Other Topics Concern  . Not on file  Social History Narrative  . Not on file   Outpatient Encounter Medications as of 03/21/2018  Medication Sig  . albuterol (PROVENTIL HFA;VENTOLIN HFA) 108 (90 Base) MCG/ACT inhaler Inhale 1-2 puffs into the lungs every 6 (six) hours as needed for wheezing or shortness of breath.  . citalopram (CELEXA) 20 MG tablet Take 1 tablet (20 mg total) by mouth daily.  Marland Kitchen ibuprofen (ADVIL,MOTRIN) 200 MG tablet Take 200 mg by mouth every  8 (eight) hours as needed.  Marland Kitchen levothyroxine (SYNTHROID, LEVOTHROID) 25 MCG tablet Take 1 tablet (25 mcg total) by mouth daily before breakfast.  . loratadine (CLARITIN) 10 MG tablet Take 1 tablet (10 mg total) by mouth daily. For allergies  . metoprolol tartrate (LOPRESSOR) 50 MG tablet Take 1 tablet (50 mg total) by mouth 2 (two) times daily.  . Naproxen Sodium (ALEVE PO) Take 2 tablets by mouth as needed.   Marland Kitchen omeprazole (PRILOSEC) 40 MG capsule Take 1 capsule (40 mg total) by mouth daily.  . Simethicone (GAS RELIEF PO) Take 1 tablet by mouth as needed.   . traZODone (DESYREL) 100 MG tablet Take 1 tablet (100 mg total) by mouth at bedtime as needed for sleep. (Patient taking differently: Take 100 mg by mouth at bedtime. )   No facility-administered encounter medications on file as of 03/21/2018.    ALLERGIES: Allergies  Allergen Reactions  . Clarithromycin Diarrhea and Other (See Comments)    Diarrhea and abd pain  . Propoxyphene Itching    VACCINATION STATUS: Immunization History  Administered Date(s) Administered  . Pneumococcal Polysaccharide-23 04/26/2013    HPI Lanaya Z Eckardt is 42 y.o. female who presents today with a medical history as above. she is here to follow-up  with thyroid function tests after she was seen in consultation for incidental thyroid nodule during CT scan of the head and neck  requested by Soyla Dryer, PA-C.  -She has not done her thyroid ultrasound yet.  -She denies any prior history of thyroid dysfunction.  Denies family history of thyroid malignancy.  Denies dysphagia, odynophagia, voice change.  She is a chronic heavy smoker.  He has been progressively gaining weight. -She denies heat/cold intolerance.  Review of Systems  Constitutional: + Progressive weight gain, + fatigue, no subjective hyperthermia, no subjective hypothermia Eyes: no blurry vision, no xerophthalmia ENT: no sore throat, no nodules palpated in throat, no dysphagia/odynophagia,  no hoarseness Cardiovascular: no Chest Pain, no Shortness of Breath, no palpitations, no leg swelling Respiratory: no cough, no SOB Gastrointestinal: no Nausea/Vomiting/Diarhhea Musculoskeletal:  + Walks with a cane due to use arthritis, + muscle/joint aches Skin: no rashes Neurological: no tremors, no numbness, no tingling, no dizziness Psychiatric: no depression, no anxiety  Objective:    BP 119/78   Pulse 66   Ht 5\' 4"  (1.626 m)   Wt 232 lb (105.2 kg)   LMP 03/12/2018 (Approximate)   BMI 39.82  kg/m   Wt Readings from Last 3 Encounters:  03/21/18 232 lb (105.2 kg)  03/19/18 233 lb 6.4 oz (105.9 kg)  03/06/18 225 lb (102.1 kg)    Physical Exam   Constitutional:  + Obese for height, not in acute distress, normal state of mind Eyes: PERRLA, EOMI, no exophthalmos ENT: moist mucous membranes, + palpable thyroid, no cervical lymphadenopathy Cardiovascular: normal precordial activity, Regular Rate and Rhythm, no Murmur/Rubs/Gallops Respiratory:  adequate breathing efforts, no gross chest deformity, Clear to auscultation bilaterally Gastrointestinal: abdomen soft, Non -tender, No distension, Bowel Sounds present Musculoskeletal: + Walks with a cane, strength intact in all four extremities Skin: moist, warm, no rashes Neurological: no tremor with outstretched hands, Deep tendon reflexes normal in all four extremities.  CMP ( most recent) CMP     Component Value Date/Time   NA 139 01/26/2018 0956   K 4.2 01/26/2018 0956   CL 104 01/26/2018 0956   CO2 28 01/26/2018 0956   GLUCOSE 79 01/26/2018 0956   BUN 9 01/26/2018 0956   CREATININE 0.57 01/26/2018 0956   CALCIUM 9.0 01/26/2018 0956   PROT 7.4 01/26/2018 0956   ALBUMIN 3.8 01/26/2018 0956   AST 98 (H) 01/26/2018 0956   ALT 237 (H) 01/26/2018 0956   ALKPHOS 86 01/26/2018 0956   BILITOT 0.7 01/26/2018 0956   GFRNONAA >60 01/26/2018 0956   GFRAA >60 01/26/2018 0956     Diabetic Labs (most recent): Lab Results   Component Value Date   HGBA1C 5.1 09/05/2017     Lipid Panel ( most recent) Lipid Panel     Component Value Date/Time   CHOL 160 09/05/2017 0621   TRIG 137 09/05/2017 0621   HDL 56 09/05/2017 0621   CHOLHDL 2.9 09/05/2017 0621   VLDL 27 09/05/2017 0621   LDLCALC 77 09/05/2017 0621      Lab Results  Component Value Date   TSH 1.189 03/06/2018   TSH 2.082 09/05/2017   FREET4 0.79 (L) 03/06/2018    December 18, 2017 CT head: 13 mm nodule on the right lobe of the thyroid.    Assessment & Plan:   1. Nodular goiter 2.  Hypothyroidism  Her thyroid function tests are suggestive of mild hypothyroidism.  She would benefit from early initiation of thyroid hormone supplements.  I discussed and initiated levothyroxine 25 mcg p.o. every morning.  - We discussed about correct intake of levothyroxine, at fasting, with water, separated by at least 30 minutes from breakfast, and separated by more than 4 hours from calcium, iron, multivitamins, acid reflux medications (PPIs). -Patient is made aware of the fact that thyroid hormone replacement is needed for life, dose to be adjusted by periodic monitoring of thyroid function tests.   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   If her thyroid ultrasound is suggestive of significant nodular lesions, she will be considered for fine-needle aspiration.  -She is advised for smoking cessation. - I advised her  to maintain close follow up with Soyla Dryer, PA-C for primary care needs.  Follow up plan: Return in about 8 weeks (around 05/16/2018).   Glade Lloyd, MD [facility] [address] [place], [place] [address] Phone: [phone]  Fax: [phone]     03/21/2018, 4:57 PM  This note was partially dictated with voice recognition software. Similar  sounding words can  be transcribed inadequately or may not  be corrected upon review.

## 2018-03-26 ENCOUNTER — Other Ambulatory Visit (HOSPITAL_COMMUNITY)
Admission: RE | Admit: 2018-03-26 | Discharge: 2018-03-26 | Disposition: A | Discharge status: Home / Self Care | Payer: Medicaid Other | Source: Ambulatory Visit | Attending: Gastroenterology | Admitting: Gastroenterology

## 2018-03-26 DIAGNOSIS — B182 Chronic viral hepatitis C: Secondary | ICD-10-CM | POA: Insufficient documentation

## 2018-03-26 LAB — PROTIME-INR
INR: 0.99
PROTHROMBIN TIME: 13 s (ref 11.4–15.2)

## 2018-03-26 LAB — CBC WITH DIFFERENTIAL/PLATELET
Basophils Absolute: 0 10*3/uL (ref 0.0–0.1)
Basophils Relative: 0 %
Eosinophils Absolute: 0.2 10*3/uL (ref 0.0–0.7)
Eosinophils Relative: 3 %
HCT: 37.3 % (ref 36.0–46.0)
HEMOGLOBIN: 12.1 g/dL (ref 12.0–15.0)
LYMPHS ABS: 2.4 10*3/uL (ref 0.7–4.0)
Lymphocytes Relative: 34 %
MCH: 30.8 pg (ref 26.0–34.0)
MCHC: 32.4 g/dL (ref 30.0–36.0)
MCV: 94.9 fL (ref 78.0–100.0)
MONOS PCT: 7 %
Monocytes Absolute: 0.5 10*3/uL (ref 0.1–1.0)
NEUTROS ABS: 4 10*3/uL (ref 1.7–7.7)
NEUTROS PCT: 56 %
Platelets: 261 10*3/uL (ref 150–400)
RBC: 3.93 MIL/uL (ref 3.87–5.11)
RDW: 13.3 % (ref 11.5–15.5)
WBC: 7.1 10*3/uL (ref 4.0–10.5)

## 2018-03-26 LAB — BASIC METABOLIC PANEL
ANION GAP: 6 (ref 5–15)
BUN: 8 mg/dL (ref 6–20)
CALCIUM: 8.7 mg/dL — AB (ref 8.9–10.3)
CHLORIDE: 107 mmol/L (ref 98–111)
CO2: 25 mmol/L (ref 22–32)
Creatinine, Ser: 0.72 mg/dL (ref 0.44–1.00)
GFR calc Af Amer: >60 mL/min (ref >60)
GFR calc non Af Amer: >60 mL/min (ref >60)
Glucose, Bld: 101 mg/dL — ABNORMAL HIGH (ref 70–99)
Potassium: 4.1 mmol/L (ref 3.5–5.1)
SODIUM: 138 mmol/L (ref 135–145)

## 2018-03-26 LAB — HEPATIC FUNCTION PANEL
ALK PHOS: 70 U/L (ref 38–126)
ALT: 104 U/L — AB (ref 0–44)
AST: 67 U/L — ABNORMAL HIGH (ref 15–41)
Albumin: 3.5 g/dL (ref 3.5–5.0)
BILIRUBIN TOTAL: 0.4 mg/dL (ref 0.3–1.2)
Bilirubin, Direct: <0.1 mg/dL (ref 0.0–0.2)
TOTAL PROTEIN: 6.8 g/dL (ref 6.5–8.1)

## 2018-03-27 ENCOUNTER — Ambulatory Visit: Payer: Medicaid Other | Admitting: Physician Assistant

## 2018-03-27 ENCOUNTER — Encounter: Payer: Self-pay | Admitting: Physician Assistant

## 2018-03-27 VITALS — BP 113/74 | HR 75 | Temp 97.0°F | Ht 64.0 in | Wt 236.0 lb

## 2018-03-27 DIAGNOSIS — E041 Nontoxic single thyroid nodule: Secondary | ICD-10-CM

## 2018-03-27 DIAGNOSIS — G8929 Other chronic pain: Secondary | ICD-10-CM

## 2018-03-27 DIAGNOSIS — K219 Gastro-esophageal reflux disease without esophagitis: Secondary | ICD-10-CM

## 2018-03-27 DIAGNOSIS — B182 Chronic viral hepatitis C: Secondary | ICD-10-CM

## 2018-03-27 DIAGNOSIS — F39 Unspecified mood [affective] disorder: Secondary | ICD-10-CM

## 2018-03-27 DIAGNOSIS — M25551 Pain in right hip: Secondary | ICD-10-CM

## 2018-03-27 DIAGNOSIS — F17219 Nicotine dependence, cigarettes, with unspecified nicotine-induced disorders: Secondary | ICD-10-CM

## 2018-03-27 DIAGNOSIS — S93402A Sprain of unspecified ligament of left ankle, initial encounter: Secondary | ICD-10-CM

## 2018-03-27 DIAGNOSIS — M545 Low back pain: Secondary | ICD-10-CM

## 2018-03-27 DIAGNOSIS — R350 Frequency of micturition: Secondary | ICD-10-CM

## 2018-03-27 DIAGNOSIS — I10 Essential (primary) hypertension: Secondary | ICD-10-CM

## 2018-03-27 LAB — HEPATITIS B SURFACE ANTIGEN: Hepatitis B Surface Ag: NEGATIVE

## 2018-03-27 LAB — POCT URINALYSIS DIPSTICK
BILIRUBIN UA: NEGATIVE
Blood, UA: NEGATIVE
Glucose, UA: NEGATIVE
Ketones, UA: NEGATIVE
LEUKOCYTES UA: NEGATIVE
NITRITE UA: NEGATIVE
PH UA: 6 (ref 5.0–8.0)
PROTEIN UA: NEGATIVE
Spec Grav, UA: 1.01 (ref 1.010–1.025)
UROBILINOGEN UA: 0.2 U/dL

## 2018-03-27 LAB — HEPATITIS A ANTIBODY, TOTAL: Hep A Total Ab: NEGATIVE

## 2018-03-27 LAB — HEPATITIS B CORE ANTIBODY, TOTAL: Hep B Core Total Ab: NEGATIVE

## 2018-03-27 LAB — HEPATITIS B SURFACE ANTIBODY, QUANTITATIVE: Hepatitis B-Post: <3.1 m[IU]/mL — ABNORMAL LOW (ref >9.9)

## 2018-03-27 NOTE — Patient Instructions (Addendum)
Financial counselor- [phone]  ______________________________________________     Ankle Sprain An ankle sprain is a stretch or tear in one of the tough, fiber-like tissues (ligaments) in the ankle. The ligaments in your ankle help to hold the bones of the ankle together. What are the causes? This condition is often caused by stepping on or falling on the outer edge of the foot. What increases the risk? This condition is more likely to develop in people who play sports. What are the signs or symptoms? Symptoms of this condition include:  Pain in your ankle.  Swelling.  Bruising. Bruising may develop right after you sprain your ankle or 1-2 days later.  Trouble standing or walking, especially when you turn or change directions.  How is this diagnosed? This condition is diagnosed with a physical exam. During the exam, your health care provider will press on certain parts of your foot and ankle and try to move them in certain ways. X-rays may be taken to see how severe the sprain is and to check for broken bones. How is this treated? This condition may be treated with:  A brace. This is used to keep the ankle from moving until it heals.  An elastic bandage. This is used to support the ankle.  Crutches.  Pain medicine.  Surgery. This may be needed if the sprain is severe.  Physical therapy. This may help to improve the range of motion in the ankle.  Follow these instructions at home:  Rest your ankle.  Take over-the-counter and prescription medicines only as told by your health care provider.  For 2-3 days, keep your ankle raised (elevated) above the level of your heart as much as possible.  If directed, apply ice to the area: ? Put ice in a plastic bag. ? Place a towel between your skin and the bag. ? Leave the ice on for 20 minutes, 2-3 times a day.  If you were given a brace: ? Wear it as directed. ? Remove it to shower or bathe. ? Try not to move your ankle  much, but wiggle your toes from time to time. This helps to prevent swelling.  If you were given an elastic bandage (dressing): ? Remove it to shower or bathe. ? Try not to move your ankle much, but wiggle your toes from time to time. This helps to prevent swelling. ? Adjust the dressing to make it more comfortable if it feels too tight. ? Loosen the dressing if you have numbness or tingling in your foot, or if your foot becomes cold and blue.  If you have crutches, use them as told by your health care provider. Continue to use them until you can walk without feeling pain in your ankle. Contact a health care provider if:  You have rapidly increasing bruising or swelling.  Your pain is not relieved with medicine. Get help right away if:  Your toes or foot becomes numb or blue.  You have severe pain that gets worse. This information is not intended to replace advice given to you by your health care provider. Make sure you discuss any questions you have with your health care provider. Document Released: 09/05/2005 Document Revised: 10/13/2016 Document Reviewed: 04/07/2015 Elsevier Interactive Patient Education  [employer].
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
## 2018-03-28 ENCOUNTER — Ambulatory Visit (HOSPITAL_COMMUNITY): Payer: Medicaid Other

## 2018-03-28 ENCOUNTER — Ambulatory Visit: Payer: Medicaid Other | Admitting: Physician Assistant

## 2018-03-28 ENCOUNTER — Ambulatory Visit (HOSPITAL_COMMUNITY)
Admission: RE | Admit: 2018-03-28 | Discharge: 2018-03-28 | Disposition: A | Discharge status: Home / Self Care | Payer: Self-pay | Source: Ambulatory Visit | Attending: "Endocrinology | Admitting: "Endocrinology

## 2018-03-28 ENCOUNTER — Ambulatory Visit (HOSPITAL_COMMUNITY)
Admission: RE | Admit: 2018-03-28 | Discharge: 2018-03-28 | Disposition: A | Discharge status: Home / Self Care | Payer: Self-pay | Source: Ambulatory Visit | Attending: Gastroenterology | Admitting: Gastroenterology

## 2018-03-28 DIAGNOSIS — B182 Chronic viral hepatitis C: Secondary | ICD-10-CM | POA: Insufficient documentation

## 2018-03-28 DIAGNOSIS — E049 Nontoxic goiter, unspecified: Secondary | ICD-10-CM | POA: Insufficient documentation

## 2018-03-29 LAB — HEPATITIS C GENOTYPE

## 2018-03-29 LAB — HCV RNA QUANT RFLX ULTRA OR GENOTYP
HCV RNA Qnt(log copy/mL): 5.591 log10 IU/mL
HEPATITIS C QUANTITATION: 390000 [IU]/mL

## 2018-04-02 NOTE — Progress Notes (Signed)
Elastography score F3/F4. May be elevated due to chronic Hep C, but we will need to pursue serial ultrasounds. Additional information under result note with labs.

## 2018-04-02 NOTE — Progress Notes (Signed)
I have documentation of a positive Hep C antibody in Dec 2018 from outside facility, so we do not need to wait an additional four months for this. She has chronic Hep C genotype 1a. Metavir F3/F4. No evidence of chronic Hep B. She needs Hep A and B vaccinations. We can submit for Harvoni. She will need this for 12 weeks. She is on a PPI, so we will need to discuss dosing of this when the medication comes in.

## 2018-04-04 ENCOUNTER — Telehealth: Payer: Self-pay | Admitting: Internal Medicine

## 2018-04-04 NOTE — Telephone Encounter (Signed)
Pt was returning a call to AM. I told her AM was at lunch and I could take a message for her to call her back. Pt said that she would call back before 5 today instead.

## 2018-04-04 NOTE — Progress Notes (Signed)
Left message for a return call.  Forwarding to [facility] to submit for the Ali Molina.

## 2018-04-05 ENCOUNTER — Telehealth: Payer: Self-pay | Admitting: *Deleted

## 2018-04-05 DIAGNOSIS — B182 Chronic viral hepatitis C: Secondary | ICD-10-CM

## 2018-04-05 NOTE — Telephone Encounter (Signed)
LMOVM. Patient needs to fill out beneficiary form I have for her before I can submit HEP C paperwork to [employer].

## 2018-04-05 NOTE — Progress Notes (Signed)
LMOM to return call on mobile ( many rings and no answer on the home number).

## 2018-04-05 NOTE — Telephone Encounter (Signed)
See previous notes. I have left Vm for a return call today.

## 2018-04-05 NOTE — Telephone Encounter (Signed)
Patient stopped by the office and she picked up the beneficiary form. She reports she will return this back to me soon.

## 2018-04-05 NOTE — Progress Notes (Signed)
Called mobile and [facility] for a return call. ( Many rings on home number and no answer.)

## 2018-04-06 NOTE — Progress Notes (Signed)
See phone note of 04/05/2018.

## 2018-04-09 NOTE — Telephone Encounter (Signed)
Patient assistance forms received. Patient is going to come by and pick up form to fill out and return back to me.

## 2018-04-09 NOTE — Telephone Encounter (Signed)
Form returned. Forms faxed to [employer].

## 2018-04-09 NOTE — Telephone Encounter (Signed)
Todd from [employer] called. Patient medicaid plan does not cover any of the Hep C plans. She has no prescription coverage. He is going to send over patient assistance forms.

## 2018-04-10 NOTE — Telephone Encounter (Signed)
Form also placed on AB desk to fill out her portion

## 2018-04-14 ENCOUNTER — Encounter (HOSPITAL_COMMUNITY): Payer: Self-pay | Admitting: Emergency Medicine

## 2018-04-14 ENCOUNTER — Other Ambulatory Visit: Payer: Self-pay

## 2018-04-14 ENCOUNTER — Emergency Department (HOSPITAL_COMMUNITY)
Admission: EM | Admit: 2018-04-14 | Discharge: 2018-04-14 | Disposition: A | Discharge status: Home / Self Care | Payer: Medicaid Other | Attending: Emergency Medicine | Admitting: Emergency Medicine

## 2018-04-14 DIAGNOSIS — I10 Essential (primary) hypertension: Secondary | ICD-10-CM | POA: Insufficient documentation

## 2018-04-14 DIAGNOSIS — E039 Hypothyroidism, unspecified: Secondary | ICD-10-CM | POA: Insufficient documentation

## 2018-04-14 DIAGNOSIS — J4520 Mild intermittent asthma, uncomplicated: Secondary | ICD-10-CM | POA: Insufficient documentation

## 2018-04-14 DIAGNOSIS — Z79899 Other long term (current) drug therapy: Secondary | ICD-10-CM | POA: Insufficient documentation

## 2018-04-14 DIAGNOSIS — R1031 Right lower quadrant pain: Secondary | ICD-10-CM | POA: Insufficient documentation

## 2018-04-14 DIAGNOSIS — R109 Unspecified abdominal pain: Secondary | ICD-10-CM

## 2018-04-14 DIAGNOSIS — F17210 Nicotine dependence, cigarettes, uncomplicated: Secondary | ICD-10-CM | POA: Insufficient documentation

## 2018-04-14 HISTORY — DX: Disorder of thyroid, unspecified: E07.9

## 2018-04-14 LAB — URINALYSIS, ROUTINE W REFLEX MICROSCOPIC
Bacteria, UA: NONE SEEN
Bilirubin Urine: NEGATIVE
GLUCOSE, UA: NEGATIVE mg/dL
Ketones, ur: NEGATIVE mg/dL
Leukocytes, UA: NEGATIVE
Nitrite: NEGATIVE
Protein, ur: NEGATIVE mg/dL
Specific Gravity, Urine: 1.01 (ref 1.005–1.030)
pH: 5 (ref 5.0–8.0)

## 2018-04-14 LAB — PREGNANCY, URINE: Preg Test, Ur: NEGATIVE

## 2018-04-14 MED ORDER — KETOROLAC TROMETHAMINE 30 MG/ML IJ SOLN
30.0000 mg | Freq: Once | INTRAMUSCULAR | Status: AC
  Administered 2018-04-14: 30 mg via INTRAMUSCULAR
  Filled 2018-04-14: qty 1

## 2018-04-14 NOTE — Discharge Instructions (Addendum)
There is no evidence of urinary tract infection on your urinalysis today. There are a few red blood cells, however this is most consistent with the fact that you are currently menstruating. Please continue with your normal pain medicines of Tylenol and ibuprofen Please return for evaluation if you have worsening symptoms, especially increased pain, fever, or began vomiting again. Please recheck with your doctor next week.

## 2018-04-14 NOTE — ED Triage Notes (Signed)
N/V yesterday- eating fine now  0 emesis since midnight  Complains of onset to R sided back pain with radiation to R abd/groin area
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
## 2018-04-18 ENCOUNTER — Encounter: Payer: Self-pay | Admitting: Physician Assistant

## 2018-04-18 ENCOUNTER — Ambulatory Visit: Payer: Medicaid Other | Admitting: Physician Assistant

## 2018-04-18 VITALS — BP 113/70 | HR 73

## 2018-04-18 DIAGNOSIS — K432 Incisional hernia without obstruction or gangrene: Secondary | ICD-10-CM

## 2018-04-18 NOTE — Progress Notes (Signed)
BP 113/70   Pulse 73   LMP 04/14/2018    Subjective:    Patient ID: Deborah Barnes, female    DOB: 1976/07/07, 42 y.o.   MRN: [id number]  HPI: Deborah Barnes is a 42 y.o. female presenting on 04/18/2018 for No chief complaint on file.   HPI   pt says she got a hernia after her gallbladder was removed.  She says it is bothering her a lot more lately.  Pt had surgery for this incisional hernia in January 2015 by dr Arnoldo Morale.   Relevant past medical, surgical, family and social history reviewed and updated as indicated. Interim medical history since our last visit reviewed. Allergies and medications reviewed and updated.   Current Outpatient Medications:  .  albuterol (PROVENTIL HFA;VENTOLIN HFA) 108 (90 Base) MCG/ACT inhaler, Inhale 1-2 puffs into the lungs every 6 (six) hours as needed for wheezing or shortness of breath., Disp: 8 g, Rfl: 11 .  citalopram (CELEXA) 20 MG tablet, Take 1 tablet (20 mg total) by mouth daily., Disp: 30 tablet, Rfl: 11 .  ibuprofen (ADVIL,MOTRIN) 200 MG tablet, Take 800 mg by mouth every 8 (eight) hours as needed. , Disp: , Rfl:  .  levothyroxine (SYNTHROID, LEVOTHROID) 25 MCG tablet, Take 1 tablet (25 mcg total) by mouth daily before breakfast., Disp: 30 tablet, Rfl: 3 .  loratadine (CLARITIN) 10 MG tablet, Take 1 tablet (10 mg total) by mouth daily. For allergies, Disp: 30 tablet, Rfl: 11 .  metoprolol tartrate (LOPRESSOR) 50 MG tablet, Take 1 tablet (50 mg total) by mouth 2 (two) times daily., Disp: 180 tablet, Rfl: 1 .  Naproxen Sodium (ALEVE PO), Take 2 tablets by mouth as needed. , Disp: , Rfl:  .  omeprazole (PRILOSEC) 40 MG capsule, Take 1 capsule (40 mg total) by mouth daily., Disp: 90 capsule, Rfl: 1 .  Simethicone (GAS RELIEF PO), Take 1 tablet by mouth as needed. , Disp: , Rfl:  .  traZODone (DESYREL) 100 MG tablet, Take 1 tablet (100 mg total) by mouth at bedtime as needed for sleep. (Patient taking differently: Take 100 mg by mouth at  bedtime. ), Disp: 30 tablet, Rfl: 0  Review of Systems  Constitutional: Positive for fatigue. Negative for appetite change, chills, diaphoresis, fever and unexpected weight change.  HENT: Positive for dental problem. Negative for congestion, drooling, ear pain, facial swelling, hearing loss, mouth sores, sneezing, sore throat, trouble swallowing and voice change.   Eyes: Positive for discharge and itching. Negative for pain, redness and visual disturbance.  Respiratory: Negative for cough, choking, shortness of breath and wheezing.   Cardiovascular: Negative for chest pain, palpitations and leg swelling.  Gastrointestinal: Positive for abdominal pain and constipation. Negative for blood in stool, diarrhea and vomiting.  Endocrine: Positive for heat intolerance. Negative for cold intolerance and polydipsia.  Genitourinary: Negative for decreased urine volume, dysuria and hematuria.  Musculoskeletal: Positive for arthralgias, back pain and gait problem.  Skin: Negative for rash.  Allergic/Immunologic: Positive for environmental allergies.  Neurological: Positive for headaches. Negative for seizures, syncope and light-headedness.  Hematological: Positive for adenopathy.  Psychiatric/Behavioral: Positive for dysphoric mood. Negative for agitation and suicidal ideas. The patient is nervous/anxious.     Per HPI unless specifically indicated above     Objective:    BP 113/70   Pulse 73   LMP 04/14/2018   Wt Readings from Last 3 Encounters:  04/14/18 236 lb (107 kg)  03/27/18 236 lb (107 kg)  03/21/18 232  lb (105.2 kg)    Physical Exam  Constitutional: She is oriented to person, place, and time. She appears well-developed and well-nourished.  HENT:  Head: Normocephalic and atraumatic.  Neck: Neck supple.  Cardiovascular: Normal rate and regular rhythm.  Pulmonary/Chest: Effort normal and breath sounds normal.  Abdominal: Soft. Bowel sounds are normal. She exhibits no mass. There is no  hepatosplenomegaly. There is no tenderness.  Unable to palpate hernia  Musculoskeletal: She exhibits no edema.  Lymphadenopathy:    She has no cervical adenopathy.  Neurological: She is alert and oriented to person, place, and time.  Skin: Skin is warm and dry.  Psychiatric: She has a normal mood and affect. Her behavior is normal.  Vitals reviewed.        Assessment & Plan:    Encounter Diagnosis  Name Primary?  . Incisional hernia, without obstruction or gangrene Yes    -Refer back to surgery for incisional hernia -Pt to check on her [facility] [employer] [facility] application that she has already submitted -pt to follow up here as scheduled

## 2018-04-23 NOTE — Telephone Encounter (Signed)
Forms have been faxed 

## 2018-04-24 NOTE — Telephone Encounter (Signed)
Patient Deborah Barnes is scheduled to be delivered on Thursday. Vicente Barnes please advise on what instructions to provide to patient. Thanks

## 2018-04-25 MED ORDER — LEDIPASVIR-SOFOSBUVIR 90-400 MG PO TABS: 1.0000 | ORAL_TABLET | Freq: Every day | ORAL | 2 refills | Status: DC

## 2018-04-25 NOTE — Telephone Encounter (Signed)
LMOVM for pt. Medication has been received

## 2018-04-25 NOTE — Telephone Encounter (Signed)
Deborah Barnes: is it coming to our office?

## 2018-04-25 NOTE — Telephone Encounter (Signed)
Great. We should have a handout that we provide with the medication and can fill out as below:   1. If possible, do not take omeprazole. IF she needs to take omeprazole, only take 20 mg daily ON AN EMPTY STOMACH AT THE EXACT SAME TIME AS HARVONI.  2. Do not take any OTC medications. Call us first.  3. Do not take Tums, Mylanta, pepcid, zantac, etc without calling us first.  4. Will need CBC, CMP, INR, Hep C RNA in 4 weeks.  5. Needs appt in 4 weeks.

## 2018-04-25 NOTE — Telephone Encounter (Signed)
Yes it is!

## 2018-04-26 NOTE — Telephone Encounter (Signed)
LMOVM

## 2018-04-27 NOTE — Telephone Encounter (Signed)
LMOVM for pt 

## 2018-04-30 NOTE — Telephone Encounter (Signed)
Spoke with patient and she is going to come by the office tomorrow

## 2018-04-30 NOTE — Telephone Encounter (Signed)
LMOVM

## 2018-05-01 NOTE — Telephone Encounter (Addendum)
Patient came by the office and has picked up harvoni with instructions. She is already scheduled for a f/u in September. Lab orders also given to patient. She is aware these need to be done 4 weeks after starting medication.

## 2018-05-08 ENCOUNTER — Encounter: Payer: Self-pay | Admitting: Physician Assistant

## 2018-05-08 ENCOUNTER — Ambulatory Visit: Payer: Medicaid Other | Admitting: Physician Assistant

## 2018-05-08 VITALS — BP 102/65 | Temp 96.5°F | Ht 64.0 in

## 2018-05-08 DIAGNOSIS — K0889 Other specified disorders of teeth and supporting structures: Secondary | ICD-10-CM

## 2018-05-08 MED ORDER — AMOXICILLIN 500 MG PO CAPS: 500.0000 mg | ORAL_CAPSULE | Freq: Three times a day (TID) | ORAL | 0 refills | Status: DC

## 2018-05-08 NOTE — Progress Notes (Signed)
   BP 102/65   Temp (!) 96.5 F (35.8 C)   Ht 5\' 4"  (1.626 m)   LMP 04/14/2018   BMI 40.51 kg/m    Subjective:    Patient ID: Deborah Barnes, female    DOB: [birth year], 42 y.o.   MRN: [id number]  HPI: Deborah Barnes is a 42 y.o. female presenting on 05/08/2018 for Dental Pain   HPI Pt states dental pain for several months.  Says tooth broke several months ago.  States some facial swelling last week but that has gone down some.   Relevant past medical, surgical, family and social history reviewed and updated as indicated. Interim medical history since our last visit reviewed. Allergies and medications reviewed and updated.   Current Outpatient Medications:  .  albuterol (PROVENTIL HFA;VENTOLIN HFA) 108 (90 Base) MCG/ACT inhaler, Inhale 1-2 puffs into the lungs every 6 (six) hours as needed for wheezing or shortness of breath., Disp: 8 g, Rfl: 11 .  citalopram (CELEXA) 20 MG tablet, Take 1 tablet (20 mg total) by mouth daily., Disp: 30 tablet, Rfl: 11 .  ibuprofen (ADVIL,MOTRIN) 200 MG tablet, Take 800 mg by mouth every 8 (eight) hours as needed. , Disp: , Rfl:  .  levothyroxine (SYNTHROID, LEVOTHROID) 25 MCG tablet, Take 1 tablet (25 mcg total) by mouth daily before breakfast., Disp: 30 tablet, Rfl: 3 .  loratadine (CLARITIN) 10 MG tablet, Take 1 tablet (10 mg total) by mouth daily. For allergies, Disp: 30 tablet, Rfl: 11 .  metoprolol tartrate (LOPRESSOR) 50 MG tablet, Take 1 tablet (50 mg total) by mouth 2 (two) times daily., Disp: 180 tablet, Rfl: 1 .  Naproxen Sodium (ALEVE PO), Take 2 tablets by mouth as needed. , Disp: , Rfl:  .  omeprazole (PRILOSEC) 40 MG capsule, Take 1 capsule (40 mg total) by mouth daily., Disp: 90 capsule, Rfl: 1 .  Simethicone (GAS RELIEF PO), Take 1 tablet by mouth as needed. , Disp: , Rfl:  .  traZODone (DESYREL) 100 MG tablet, Take 1 tablet (100 mg total) by mouth at bedtime as needed for sleep. (Patient taking differently: Take 100 mg by mouth at  bedtime. ), Disp: 30 tablet, Rfl: 0 .  Ledipasvir-Sofosbuvir (HARVONI) 90-400 MG TABS, Take 1 tablet by mouth daily. (Patient not taking: Reported on 05/08/2018), Disp: 30 tablet, Rfl: 2   Review of Systems  Per HPI unless specifically indicated above     Objective:    BP 102/65   Temp (!) 96.5 F (35.8 C)   Ht 5\' 4"  (1.626 m)   LMP 04/14/2018   BMI 40.51 kg/m   Wt Readings from Last 3 Encounters:  04/14/18 236 lb (107 kg)  03/27/18 236 lb (107 kg)  03/21/18 232 lb (105.2 kg)    Physical Exam  Constitutional: She appears well-developed and well-nourished.  HENT:  Head: Normocephalic and atraumatic.  Mouth/Throat: Uvula is midline. No trismus in the jaw. Abnormal dentition. Dental caries present. No dental abscesses or uvula swelling.  No facial swelling  Pulmonary/Chest: Effort normal. No respiratory distress.  Neurological: She is alert.  Skin: Skin is warm and dry.  Psychiatric: She has a normal mood and affect. Her behavior is normal.  Nursing note reviewed.     Assessment & Plan:    Encounter Diagnosis  Name Primary?  Deborah Barnes Yes    -rx amoxil -dental list -follow up as scheduled.  RTO sooner prn

## 2018-05-10 ENCOUNTER — Telehealth: Payer: Self-pay | Admitting: Internal Medicine

## 2018-05-10 NOTE — Telephone Encounter (Signed)
[phone]  PLEASE CALL PATIENT, SHE HAS QUESTIONS ABOUT LOWERING HER OMEPRAZOLE SO SHE CAN START HER HARVONI

## 2018-05-10 NOTE — Telephone Encounter (Signed)
Lmom, waiting on a return call.  

## 2018-05-11 ENCOUNTER — Other Ambulatory Visit (HOSPITAL_COMMUNITY)
Admission: RE | Admit: 2018-05-11 | Discharge: 2018-05-11 | Disposition: A | Discharge status: Home / Self Care | Payer: Medicaid Other | Source: Ambulatory Visit | Attending: "Endocrinology | Admitting: "Endocrinology

## 2018-05-11 ENCOUNTER — Telehealth: Payer: Self-pay | Admitting: Internal Medicine

## 2018-05-11 DIAGNOSIS — E039 Hypothyroidism, unspecified: Secondary | ICD-10-CM | POA: Insufficient documentation

## 2018-05-11 LAB — T4, FREE: FREE T4: 0.93 ng/dL (ref 0.82–1.77)

## 2018-05-11 LAB — TSH: TSH: 0.8 u[IU]/mL (ref 0.350–4.500)

## 2018-05-11 NOTE — Telephone Encounter (Signed)
Spoke with pt. She hasn't been able to start [place] yet. Pt was asked to reduce Omeprazole to 20 mgs daily and her [facility] is [facility] for 40 mg. The [facility] wants AB to take over her Omeprazole 20 mg rx and send it to [place].

## 2018-05-11 NOTE — Telephone Encounter (Signed)
Pt called this morning to say that she had left a message for the nurse to call her. She said to call [phone]

## 2018-05-14 MED ORDER — OMEPRAZOLE 20 MG PO CPDR: 20.0000 mg | DELAYED_RELEASE_CAPSULE | Freq: Every day | ORAL | 3 refills | Status: DC

## 2018-05-14 NOTE — Telephone Encounter (Signed)
See other phone notes.

## 2018-05-14 NOTE — Telephone Encounter (Signed)
Pt is aware and  is stopping by today to have AB fill out a form adding Omeprazole 20 mg on it. Sheet of updated medication is needed for pts facilities she lives in.

## 2018-05-14 NOTE — Telephone Encounter (Signed)
I sent in omeprazole 20 mg. Needs to take at exact time as Harvoni if she absolutely needs it.

## 2018-05-14 NOTE — Addendum Note (Signed)
Addended by: Annitta Needs on: 05/14/2018 10:51 AM   Modules accepted: Orders

## 2018-05-14 NOTE — Telephone Encounter (Signed)
AB, Pts pharmacy called and said they just sent pt Omeprazole 40 mg on 04/30/18 which was given by Soyla Dryer, MD. They are a non profit pharmacy and want to know if the 40 will be ok since it was just given to pt? Please advise.

## 2018-05-15 NOTE — Telephone Encounter (Signed)
Called pts pharmacy and they are changing medication to Omeprazole 20 mg. They are aware pt can resume Omeprazole 40 mg after Hep C treatment.

## 2018-05-15 NOTE — Telephone Encounter (Signed)
Unfortunately, no. She needs to only take prilosec 20 mg daily at same time as Harvoni. This is how it was done in the studies. Concern about efficacy is raised if higher doses PPI are taken with Hep C regimens.   Another option is this: avoid PPI altogether if she is able to during treatment regimen. She will be able to resume the 40 mg after treatment is complete.

## 2018-05-15 NOTE — Telephone Encounter (Signed)
Lmom, pts form is ready for pickup.

## 2018-05-16 ENCOUNTER — Telehealth: Payer: Self-pay | Admitting: *Deleted

## 2018-05-16 NOTE — Telephone Encounter (Signed)
[facility] called to schedule next delivery for harvoni. Scheduled delivery is for 04/22/18. Reviewing patient chart she has not started treatment yet.   I called patient and confirmed she has not started the medications and she is unsure when she will start the medication. Looks like she was also started on an ABX 05/08/18 TID #30 tablets.   Per Philis Fendt when patient is due for next refill to call them to schedule shipment. FYI to AB

## 2018-05-17 ENCOUNTER — Ambulatory Visit (INDEPENDENT_AMBULATORY_CARE_PROVIDER_SITE_OTHER): Payer: Self-pay | Admitting: "Endocrinology

## 2018-05-17 ENCOUNTER — Encounter: Payer: Self-pay | Admitting: "Endocrinology

## 2018-05-17 VITALS — BP 108/71 | HR 68 | Ht 64.0 in | Wt 244.0 lb

## 2018-05-17 DIAGNOSIS — E039 Hypothyroidism, unspecified: Secondary | ICD-10-CM

## 2018-05-17 MED ORDER — LEVOTHYROXINE SODIUM 50 MCG PO TABS: 50.0000 ug | ORAL_TABLET | Freq: Every day | ORAL | 6 refills | Status: DC

## 2018-05-17 NOTE — Progress Notes (Signed)
Endocrinology follow-up note                                            05/17/2018, 4:51 PM   Subjective:    Patient ID: Deborah Barnes, female    DOB: 09/18/1976, PCP Soyla Dryer, PA-C   Past Medical History:  Diagnosis Date  . Asthma   . Back pain   . Bulging disc   . COPD (chronic obstructive pulmonary disease) ([place])   . Degenerative disc disease   . Depression   . Depression with anxiety   . GERD (gastroesophageal reflux disease)   . Hepatitis C 08/2017  . Hypercholesterolemia   . Hypertension   . PTSD (post-traumatic stress disorder)   . Sciatica   . Shortness of breath   . Thyroid disease    Past Surgical History:  Procedure Laterality Date  . BIOPSY N/A 09/25/2014   Procedure: BIOPSY;  Surgeon: Daneil Dolin, MD;  Location: [facility];  Service: Endoscopy;  Laterality: N/A;  Gastric, Ascending Colon, Descending/Sigmoid Colon   . CARPAL TUNNEL RELEASE Bilateral   . CHOLECYSTECTOMY N/A 04/26/2013   Procedure: LAPAROSCOPIC CHOLECYSTECTOMY;  Surgeon: Jamesetta So, MD;  Location: [facility];  Service: General;  Laterality: N/A;  . COLONOSCOPY WITH PROPOFOL N/A 09/25/2014   RMR: Pancolonic diverticulosis. multiple tubular adenomas, segmental biopsies negative. surveillance in 5 years  . ESOPHAGOGASTRODUODENOSCOPY (EGD) WITH PROPOFOL N/A 09/25/2014   RMR: Normal esophagus. Abnormal gastric mucosa status post biopsy (negative H.pylori). Hiatal hernia.   Fatima Blank HERNIA REPAIR N/A 10/04/2013   Procedure: HERNIA REPAIR INCISIONAL WITH MESH;  Surgeon: Jamesetta So, MD;  Location: [facility];  Service: General;  Laterality: N/A;  . INSERTION OF MESH N/A 10/04/2013   Procedure: INSERTION OF MESH;  Surgeon: Jamesetta So, MD;  Location: [facility];  Service: General;  Laterality: N/A;  . POLYPECTOMY N/A 09/25/2014   Procedure: POLYPECTOMY;  Surgeon: Daneil Dolin, MD;  Location: [facility];  Service: Endoscopy;  Laterality: N/A;  Cecal, Ascending Colon   . SHOULDER SURGERY    .  TUBAL LIGATION     Social History   Socioeconomic History  . Marital status: Divorced    Spouse name: Not on file  . Number of children: Not on file  . Years of education: Not on file  . Highest education level: Not on file  Occupational History  . Not on file  Social Needs  . Financial resource strain: Not on file  . Food insecurity:    Worry: Not on file    Inability: Not on file  . Transportation needs:    Medical: Not on file    Non-medical: Not on file  Tobacco Use  . Smoking status: Current Every Day Smoker    Packs/day: 0.75    Years: 27.00    Pack years: 20.25    Types: Cigarettes  . Smokeless tobacco: Never Used  Substance and Sexual Activity  . Alcohol use: Not Currently    Alcohol/week: 1.0 standard drinks    Types: 1 Cans of beer per week    Frequency: Never    Comment: hx binge drinker. none since 12/16/17  . Drug use: Not Currently    Types: Marijuana, Cocaine, Methamphetamines, Heroin    Comment: no cocaine, heroin, meth since 11-27-2017. last marijuana use 01-21-18  . Sexual activity:  Yes    Birth control/protection: Surgical  Lifestyle  . Physical activity:    Days per week: Not on file    Minutes per session: Not on file  . Stress: Not on file  Relationships  . Social connections:    Talks on phone: Not on file    Gets together: Not on file    Attends religious service: Not on file    Active member of club or organization: Not on file    Attends meetings of clubs or organizations: Not on file    Relationship status: Not on file  Other Topics Concern  . Not on file  Social History Narrative  . Not on file   Outpatient Encounter Medications as of 05/17/2018  Medication Sig  . albuterol (PROVENTIL HFA;VENTOLIN HFA) 108 (90 Base) MCG/ACT inhaler Inhale 1-2 puffs into the lungs every 6 (six) hours as needed for wheezing or shortness of breath.  Marland Kitchen amoxicillin (AMOXIL) 500 MG capsule Take 1 capsule (500 mg total) by mouth 3 (three) times daily.  .  citalopram (CELEXA) 20 MG tablet Take 1 tablet (20 mg total) by mouth daily.  Mar[place] Kitchen ibuprofen (ADVIL,MOTRIN) 200 MG tablet Take 800 mg by mouth every 8 (eight) hours as needed.   . Ledipasvir-Sofosbuvir (HARVONI) 90-400 MG TABS Take 1 tablet by mouth daily. (Patient not taking: Reported on 05/08/2018)  . levothyroxine (SYNTHROID, LEVOTHROID) 50 MCG tablet Take 1 tablet (50 mcg total) by mouth daily before breakfast.  . loratadine (CLARITIN) 10 MG tablet Take 1 tablet (10 mg total) by mouth daily. For allergies  . metoprolol tartrate (LOPRESSOR) 50 MG tablet Take 1 tablet (50 mg total) by mouth 2 (two) times daily.  . Naproxen Sodium (ALEVE PO) Take 2 tablets by mouth as needed.   [place] Kitchen omeprazole (PRILOSEC) 20 MG capsule Take 1 capsule (20 mg total) by mouth daily. 30 minutes before breakfast  . Simethicone (GAS RELIEF PO) Take 1 tablet by mouth as needed.   . traZODone (DESYREL) 100 MG tablet Take 1 tablet (100 mg total) by mouth at bedtime as needed for sleep. (Patient taking differently: Take 100 mg by mouth at bedtime. )  . [DISCONTINUED] levothyroxine (SYNTHROID, LEVOTHROID) 25 MCG tablet Take 1 tablet (25 mcg total) by mouth daily before breakfast.   No facility-administered encounter medications on file as of 05/17/2018.    ALLERGIES: Allergies  Allergen Reactions  . Clarithromycin Diarrhea and Other (See Comments)    Diarrhea and abd pain  . Propoxyphene Itching    VACCINATION STATUS: Immunization History  Administered Date(s) Administered  . Pneumococcal Polysaccharide-23 04/26/2013    HPI Deborah Barnes is 42 y.o. female who presents today with a medical history as above. she is here with new set of thyroid function test after she was seen for hypothyroidism.   -Complains of progressive weight gain.  She denies palpitations, tremors, heat intolerance. - Denies family history of thyroid malignancy.  Denies dysphagia, odynophagia, voice change.  She is a chronic heavy smoker.   -She  denies heat/cold intolerance.  She admits to dietary indiscretions.  Review of Systems  Constitutional: + Progressive weight gain , + fatigue, no subjective hyperthermia, no subjective hypothermia Eyes: no blurry vision, no xerophthalmia ENT: no sore throat, no nodules palpated in throat, no dysphagia/odynophagia, no hoarseness  Gastrointestinal: no Nausea/Vomiting/Diarhhea Musculoskeletal:  + Walks with a cane due to diffuse  arthritis, + muscle/joint aches Skin: no rashes Neurological: no tremors, no numbness, no tingling, no dizziness Psychiatric: no depression, no  anxiety  Objective:    BP 108/71   Pulse 68   Ht 5\' 4"  (1.626 m)   Wt 244 lb (110.7 kg)   BMI 41.88 kg/m   Wt Readings from Last 3 Encounters:  05/17/18 244 lb (110.7 kg)  04/14/18 236 lb (107 kg)  03/27/18 236 lb (107 kg)    Physical Exam   Constitutional:  + Obese for height, not in acute distress, normal state of mind Eyes: PERRLA, EOMI, no exophthalmos ENT: moist mucous membranes, + palpable thyroid, no cervical lymphadenopathy  Musculoskeletal: + Walks with a cane, strength intact in all four extremities Skin: moist, warm, no rashes Neurological: no tremor with outstretched hands, Deep tendon reflexes normal in all four extremities.  CMP ( most recent) CMP     Component Value Date/Time   NA 138 03/26/2018 1332   K 4.1 03/26/2018 1332   CL 107 03/26/2018 1332   CO2 25 03/26/2018 1332   GLUCOSE 101 (H) 03/26/2018 1332   BUN 8 03/26/2018 1332   CREATININE 0.72 03/26/2018 1332   CALCIUM 8.7 (L) 03/26/2018 1332   PROT 6.8 03/26/2018 1332   ALBUMIN 3.5 03/26/2018 1332   AST 67 (H) 03/26/2018 1332   ALT 104 (H) 03/26/2018 1332   ALKPHOS 70 03/26/2018 1332   BILITOT 0.4 03/26/2018 1332   GFRNONAA >60 03/26/2018 1332   GFRAA >60 03/26/2018 1332     Diabetic Labs (most recent): Lab Results  Component Value Date   HGBA1C 5.1 09/05/2017     Lipid Panel ( most recent) Lipid Panel      Component Value Date/Time   CHOL 160 09/05/2017 0621   TRIG 137 09/05/2017 0621   HDL 56 09/05/2017 0621   CHOLHDL 2.9 09/05/2017 0621   VLDL 27 09/05/2017 0621   LDLCALC 77 09/05/2017 0621      Lab Results  Component Value Date   TSH 0.800 05/11/2018   TSH 1.189 03/06/2018   TSH 2.082 09/05/2017   FREET4 0.93 05/11/2018   FREET4 0.79 (L) 03/06/2018    December 18, 2017 CT head: 13 mm nodule on the right lobe of the thyroid.   March 28, 2018 thyroid ultrasound IMPRESSION: Right thyroid nodule does not meet criteria for biopsy or surveillance, as designated by the newly established ACR TI-RADS criteria.  Assessment & Plan:   1. Nodular goiter 2.  Hypothyroidism  Her thyroid function tests are improving.  However, she would benefit from higher dose of thyroid hormone.   -I discussed and increased her levothyroxine to 50 mcg p.o. nightly.    - We discussed about correct intake of levothyroxine, at fasting, with water, separated by at least 30 minutes from breakfast, and separated by more than 4 hours from calcium, iron, multivitamins, acid reflux medications (PPIs). -Patient is made aware of the fact that thyroid hormone replacement is needed for life, dose to be adjusted by periodic monitoring of thyroid function tests.  -Based on her ultrasound findings, she would not require fine-needle aspiration at this time.  She may need repeat thyroid ultrasound in 1-2 years.  -She is advised for smoking cessation. - I advised her  to maintain close follow up with Soyla Dryer, PA-C for primary care needs.  Follow up plan: Return in about 4 months (around 09/16/2018) for Follow up with Pre-visit Labs.   Glade Lloyd, MD [facility] [address] [place], [place] [address] Phone: [phone]  Fax: [phone]     05/17/2018, 4:51 PM  This note was partially dictated with voice recognition software. Similar sounding  words can be transcribed inadequately or may not  be corrected upon review.

## 2018-05-17 NOTE — Patient Instructions (Signed)
                                       Advice for Weight Management  -For most of us the best way to lose weight is by diet management. Generally speaking, diet management means consuming less calories intentionally which over time brings about progressive weight loss.  This can be achieved more effectively by restricting carbohydrate consumption to the minimum possible.  More importantly, our carbohydrates sources should be unprocessed or minimally processed complex starch food items.   Sometimes, it is important to balance nutrition by increasing protein intake (animal or plant source), fruits, and vegetables.  -Sticking to a routine mealtime to eat 3 meals a day and avoiding unnecessary snacks is shown to have a big role in weight control.  -It is better to avoid simple carbohydrates including: Cakes, Sweet Desserts, Ice Cream, Soda (diet and regular), Sweet Tea, Candies, Chips, Cookies, Store Bought Juices, Alcohol in Excess of  1-2 drinks a day, Artificial Sweeteners, Doughnuts, Coffee Creamers, "Sugar-free" Products, etc, etc.  This is not a complete list.....    -Consulting with certified diabetes educators is proven to provide you with the most accurate and current information on diet.  Also, you may be  interested in discussing diet options/exchanges , we can schedule a visit with Penny Crumpton, RDN, CDE for individualized nutrition education.  -Exercise: If you are able: 30 -60 minutes a day ,4 days a week, or 150 minutes a week.  The longer the better.  Combine stretch, strength, and aerobic activities.  If you were told in the past that you have high risk for cardiovascular diseases, you may seek evaluation by your heart doctor prior to initiating moderate to intense exercise programs.     

## 2018-05-18 NOTE — Telephone Encounter (Signed)
Patient called back and is aware.

## 2018-05-18 NOTE — Telephone Encounter (Signed)
lmovm

## 2018-05-18 NOTE — Telephone Encounter (Signed)
Let's have her complete antibiotics she was prescribed. She is already scheduled to see me 9/16. We can have her start on that day and then see her 4 weeks thereafter.

## 2018-05-28 ENCOUNTER — Ambulatory Visit: Payer: Medicaid Other | Admitting: Physician Assistant

## 2018-05-28 ENCOUNTER — Encounter: Payer: Self-pay | Admitting: Physician Assistant

## 2018-05-28 VITALS — BP 138/84 | HR 79 | Temp 97.7°F | Ht 64.0 in | Wt 245.5 lb

## 2018-05-28 DIAGNOSIS — E041 Nontoxic single thyroid nodule: Secondary | ICD-10-CM

## 2018-05-28 DIAGNOSIS — B182 Chronic viral hepatitis C: Secondary | ICD-10-CM

## 2018-05-28 DIAGNOSIS — F1911 Other psychoactive substance abuse, in remission: Secondary | ICD-10-CM

## 2018-05-28 DIAGNOSIS — M25551 Pain in right hip: Secondary | ICD-10-CM

## 2018-05-28 DIAGNOSIS — I10 Essential (primary) hypertension: Secondary | ICD-10-CM

## 2018-05-28 DIAGNOSIS — K219 Gastro-esophageal reflux disease without esophagitis: Secondary | ICD-10-CM

## 2018-05-28 DIAGNOSIS — G8929 Other chronic pain: Secondary | ICD-10-CM

## 2018-05-28 DIAGNOSIS — J449 Chronic obstructive pulmonary disease, unspecified: Secondary | ICD-10-CM

## 2018-05-28 DIAGNOSIS — R002 Palpitations: Secondary | ICD-10-CM

## 2018-05-28 DIAGNOSIS — F39 Unspecified mood [affective] disorder: Secondary | ICD-10-CM

## 2018-05-28 DIAGNOSIS — M545 Low back pain: Secondary | ICD-10-CM

## 2018-05-28 MED ORDER — ALBUTEROL SULFATE HFA 108 (90 BASE) MCG/ACT IN AERS: 1.0000 | INHALATION_SPRAY | Freq: Four times a day (QID) | RESPIRATORY_TRACT | 0 refills | Status: DC | PRN

## 2018-05-28 MED ORDER — LEVOTHYROXINE SODIUM 25 MCG PO TABS: 25.0000 ug | ORAL_TABLET | Freq: Every day | ORAL | 0 refills | Status: DC

## 2018-05-28 NOTE — Progress Notes (Signed)
BP 138/84   Pulse 79   Temp 97.7 F (36.5 C)   Ht 5\' 4"  (1.626 m)   Wt 245 lb 8 oz (111.4 kg)   SpO2 98%   BMI 42.14 kg/m    Subjective:    Patient ID: Deborah Barnes, female    DOB: [birth year], 42 y.o.   MRN: [id number]  HPI: Deborah Barnes is a 42 y.o. female presenting on 05/28/2018 for No chief complaint on file.   HPI   Pt is still at [place].  She is wore down and a bit stressed.    Se got approved for [facility]  She has appt later this week with orthopedist for pain- hip and back  She has f/u appt with endocrine for thyroid nodule  She has f/u with GI for hepatitis  She is still going to [facility]  She says the humidity has been bothering her breathing.  She continues to smoke.   Relevant past medical, surgical, family and social history reviewed and updated as indicated. Interim medical history since our last visit reviewed. Allergies and medications reviewed and updated.   Current Outpatient Medications:  .  acetaminophen (TYLENOL) 500 MG tablet, Take 500 mg by mouth every 8 (eight) hours as needed., Disp: , Rfl:  .  albuterol (PROVENTIL HFA;VENTOLIN HFA) 108 (90 Base) MCG/ACT inhaler, Inhale 1-2 puffs into the lungs every 6 (six) hours as needed for wheezing or shortness of breath., Disp: 8 g, Rfl: 11 .  citalopram (CELEXA) 20 MG tablet, Take 1 tablet (20 mg total) by mouth daily., Disp: 30 tablet, Rfl: 11 .  ibuprofen (ADVIL,MOTRIN) 200 MG tablet, Take 800 mg by mouth every 8 (eight) hours as needed. , Disp: , Rfl:  .  levothyroxine (SYNTHROID, LEVOTHROID) 50 MCG tablet, Take 1 tablet (50 mcg total) by mouth daily before breakfast., Disp: 30 tablet, Rfl: 6 .  loratadine (CLARITIN) 10 MG tablet, Take 1 tablet (10 mg total) by mouth daily. For allergies, Disp: 30 tablet, Rfl: 11 .  metoprolol tartrate (LOPRESSOR) 50 MG tablet, Take 1 tablet (50 mg total) by mouth 2 (two) times daily., Disp: 180 tablet, Rfl: 1 .  Naproxen Sodium (ALEVE PO), Take  2 tablets by mouth as needed. , Disp: , Rfl:  .  omeprazole (PRILOSEC) 40 MG capsule, Take 40 mg by mouth daily., Disp: , Rfl:  .  Simethicone (GAS RELIEF PO), Take 1 tablet by mouth as needed. , Disp: , Rfl:  .  traZODone (DESYREL) 100 MG tablet, Take 1 tablet (100 mg total) by mouth at bedtime as needed for sleep. (Patient taking differently: Take 100 mg by mouth at bedtime. ), Disp: 30 tablet, Rfl: 0 .  Ledipasvir-Sofosbuvir (HARVONI) 90-400 MG TABS, Take 1 tablet by mouth daily. (Patient not taking: Reported on 05/08/2018), Disp: 30 tablet, Rfl: 2 .  omeprazole (PRILOSEC) 20 MG capsule, Take 1 capsule (20 mg total) by mouth daily. 30 minutes before breakfast (Patient not taking: Reported on 05/28/2018), Disp: 90 capsule, Rfl: 3   Review of Systems  Constitutional: Positive for diaphoresis and fatigue. Negative for appetite change, chills, fever and unexpected weight change.  HENT: Positive for congestion, dental problem, mouth sores, sneezing and trouble swallowing. Negative for drooling, ear pain, facial swelling, hearing loss, sore throat and voice change.   Eyes: Positive for discharge, redness, itching and visual disturbance. Negative for pain.  Respiratory: Positive for cough, choking, shortness of breath and wheezing.   Cardiovascular: Positive for palpitations and  leg swelling. Negative for chest pain.  Gastrointestinal: Positive for constipation. Negative for abdominal pain, blood in stool, diarrhea and vomiting.  Endocrine: Positive for heat intolerance. Negative for cold intolerance and polydipsia.  Genitourinary: Negative for decreased urine volume, dysuria and hematuria.  Musculoskeletal: Positive for arthralgias, back pain and gait problem.  Skin: Negative for rash.  Allergic/Immunologic: Positive for environmental allergies.  Neurological: Negative for seizures, syncope, light-headedness and headaches.  Hematological: Negative for adenopathy.  Psychiatric/Behavioral: Positive for  agitation and dysphoric mood. Negative for suicidal ideas. The patient is nervous/anxious.     Per HPI unless specifically indicated above     Objective:    BP 138/84   Pulse 79   Temp 97.7 F (36.5 C)   Ht 5\' 4"  (1.626 m)   Wt 245 lb 8 oz (111.4 kg)   SpO2 98%   BMI 42.14 kg/m   Wt Readings from Last 3 Encounters:  05/28/18 245 lb 8 oz (111.4 kg)  05/17/18 244 lb (110.7 kg)  04/14/18 236 lb (107 kg)    Physical Exam  Constitutional: She is oriented to person, place, and time. She appears well-developed and well-nourished.  HENT:  Head: Normocephalic and atraumatic.  Neck: Neck supple.  Cardiovascular: Normal rate and regular rhythm.  Pulmonary/Chest: Effort normal and breath sounds normal.  Abdominal: Soft. Bowel sounds are normal. She exhibits no mass. There is no hepatosplenomegaly. There is no tenderness.  Musculoskeletal: She exhibits no edema.  Lymphadenopathy:    She has no cervical adenopathy.  Neurological: She is alert and oriented to person, place, and time.  Skin: Skin is warm and dry.  Psychiatric: She has a normal mood and affect. Her behavior is normal.  Vitals reviewed.    EKG- NSR at 62 bpm no st -t changes  Results for orders placed or performed during the hospital encounter of 05/11/18  TSH  Result Value Ref Range   TSH 0.800 0.350 - 4.500 uIU/mL  T4, free  Result Value Ref Range   Free T4 0.93 0.82 - 1.77 ng/dL      Assessment & Plan:    Encounter Diagnoses  Name Primary?  . Essential hypertension Yes  . Palpitations   . Thyroid nodule   . Chronic hepatitis C without hepatic coma ([place])   . Gastroesophageal reflux disease, esophagitis presence not specified   . Chronic obstructive pulmonary disease, unspecified COPD type ([place])   . Mood disorder ([place])   . Chronic midline low back pain, with sciatica presence unspecified   . Right hip pain   . Substance abuse in remission ([place])     -Go back to levothyroxine 25 due to palpitations and  labs therapeutic on 48mcg  -pt will continue Same meds for bp as it has been well controlled and even on the low side until today.  Pt thinks it's high today because she is stressed -pt has orthopedics appointment later this week -pt has GI follow up next week -will Recheck bp in 1 month

## 2018-05-30 ENCOUNTER — Encounter: Payer: Self-pay | Admitting: Orthopaedic Surgery

## 2018-05-30 ENCOUNTER — Ambulatory Visit (INDEPENDENT_AMBULATORY_CARE_PROVIDER_SITE_OTHER): Payer: Self-pay | Admitting: Orthopaedic Surgery

## 2018-05-30 VITALS — BP 113/77 | HR 73 | Ht 64.0 in | Wt 241.0 lb

## 2018-05-30 DIAGNOSIS — M1611 Unilateral primary osteoarthritis, right hip: Secondary | ICD-10-CM

## 2018-05-30 DIAGNOSIS — Z6841 Body Mass Index (BMI) 40.0 and over, adult: Secondary | ICD-10-CM

## 2018-05-30 DIAGNOSIS — M25561 Pain in right knee: Secondary | ICD-10-CM

## 2018-05-30 DIAGNOSIS — G8929 Other chronic pain: Secondary | ICD-10-CM
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
## 2018-06-04 ENCOUNTER — Encounter: Payer: Self-pay | Admitting: Gastroenterology

## 2018-06-04 ENCOUNTER — Telehealth: Payer: Self-pay | Admitting: Gastroenterology

## 2018-06-04 ENCOUNTER — Ambulatory Visit (INDEPENDENT_AMBULATORY_CARE_PROVIDER_SITE_OTHER): Payer: Self-pay | Admitting: Gastroenterology

## 2018-06-04 VITALS — BP 120/78 | HR 65 | Temp 97.9°F | Ht 64.0 in | Wt 237.2 lb

## 2018-06-04 DIAGNOSIS — B182 Chronic viral hepatitis C: Secondary | ICD-10-CM

## 2018-06-04 NOTE — Patient Instructions (Signed)
You can start Harvoni today, making sure to take omeprazole 20 milligrams with it at the same time.   Don't take any over the counter medications, herbal supplements, antacids, etc without calling us.  I will see you back in 4 weeks! You can do your bloodwork around that time and we can review at the visit. After that, I will see you in 2 months!  I enjoyed seeing you again today! As you know, I value our relationship and want to provide genuine, compassionate, and quality care. I welcome your feedback. If you receive a survey regarding your visit,  I greatly appreciate you taking time to fill this out. See you next time!  Annitta Needs, PhD, ANP-BC [facility]

## 2018-06-04 NOTE — Telephone Encounter (Signed)
Please let Philis Fendt know that patient is starting first bottle of Harvoni today, 9/16. She will need to get back on track for shipments. She has the first bottle already.

## 2018-06-04 NOTE — Progress Notes (Signed)
Referring Provider: Soyla Dryer, PA-C Primary Care Physician:  Soyla Dryer, PA-C Primary GI: Dr. Gala Romney   Chief Complaint  Patient presents with  . Hepatitis    has not started tx yet    HPI:   Deborah Barnes is a 42 y.o. female presenting today with a history of chronic Hep C genotype 1a, Metavir F3/F4. No evidence of chronic Hep B. Needs Hep A and B vaccinations. Will need Harvoni for 12 weeks.    Needs to take omeprazole 20 mg daily instead of 40. This has been changed, and she is aware. Looking into Hep A/B vaccinations from health department. Will start [place] today, 9/16. No abdominal pain, N/V. No rectal bleeding. Fully understands dosing regimen and importance of calling prior to any medication changes or questions.   Past Medical History:  Diagnosis Date  . Asthma   . Back pain   . Bulging disc   . COPD (chronic obstructive pulmonary disease) ([place])   . Degenerative disc disease   . Depression   . Depression with anxiety   . GERD (gastroesophageal reflux disease)   . Hepatitis C 08/2017  . Hypercholesterolemia   . Hypertension   . PTSD (post-traumatic stress disorder)   . Sciatica   . Shortness of breath   . Thyroid disease     Past Surgical History:  Procedure Laterality Date  . BIOPSY N/A 09/25/2014   Procedure: BIOPSY;  Surgeon: Daneil Dolin, MD;  Location: [facility];  Service: Endoscopy;  Laterality: N/A;  Gastric, Ascending Colon, Descending/Sigmoid Colon   . CARPAL TUNNEL RELEASE Bilateral   . CHOLECYSTECTOMY N/A 04/26/2013   Procedure: LAPAROSCOPIC CHOLECYSTECTOMY;  Surgeon: Jamesetta So, MD;  Location: [facility];  Service: General;  Laterality: N/A;  . COLONOSCOPY WITH PROPOFOL N/A 09/25/2014   RMR: Pancolonic diverticulosis. multiple tubular adenomas, segmental biopsies negative. surveillance in 5 years  . ESOPHAGOGASTRODUODENOSCOPY (EGD) WITH PROPOFOL N/A 09/25/2014   RMR: Normal esophagus. Abnormal gastric mucosa status post biopsy (negative  H.pylori). Hiatal hernia.   Fatima Blank HERNIA REPAIR N/A 10/04/2013   Procedure: HERNIA REPAIR INCISIONAL WITH MESH;  Surgeon: Jamesetta So, MD;  Location: [facility];  Service: General;  Laterality: N/A;  . INSERTION OF MESH N/A 10/04/2013   Procedure: INSERTION OF MESH;  Surgeon: Jamesetta So, MD;  Location: [facility];  Service: General;  Laterality: N/A;  . POLYPECTOMY N/A 09/25/2014   Procedure: POLYPECTOMY;  Surgeon: Daneil Dolin, MD;  Location: [facility];  Service: Endoscopy;  Laterality: N/A;  Cecal, Ascending Colon   . SHOULDER SURGERY    . TUBAL LIGATION      Current Outpatient Medications  Medication Sig Dispense Refill  . acetaminophen (TYLENOL) 500 MG tablet Take 500 mg by mouth every 8 (eight) hours as needed.    Marland Kitchen albuterol (PROVENTIL HFA;VENTOLIN HFA) 108 (90 Base) MCG/ACT inhaler Inhale 1-2 puffs into the lungs every 6 (six) hours as needed for wheezing or shortness of breath. 3 Inhaler 0  . citalopram (CELEXA) 20 MG tablet Take 1 tablet (20 mg total) by mouth daily. 30 tablet 11  . ibuprofen (ADVIL,MOTRIN) 200 MG tablet Take 800 mg by mouth every 8 (eight) hours as needed.     Marland Kitchen levothyroxine (SYNTHROID, LEVOTHROID) 25 MCG tablet Take 1 tablet (25 mcg total) by mouth daily before breakfast. 90 tablet 0  . loratadine (CLARITIN) 10 MG tablet Take 1 tablet (10 mg total) by mouth daily. For allergies 30 tablet 11  . metoprolol  tartrate (LOPRESSOR) 50 MG tablet Take 1 tablet (50 mg total) by mouth 2 (two) times daily. 180 tablet 1  . Naproxen Sodium (ALEVE PO) Take 2 tablets by mouth as needed.     Marland Kitchen omeprazole (PRILOSEC) 40 MG capsule Take 40 mg by mouth daily.    . Simethicone (GAS RELIEF PO) Take 1 tablet by mouth as needed.     . traZODone (DESYREL) 100 MG tablet Take 1 tablet (100 mg total) by mouth at bedtime as needed for sleep. (Patient taking differently: Take 100 mg by mouth at bedtime. ) 30 tablet 0  . Ledipasvir-Sofosbuvir (HARVONI) 90-400 MG TABS Take 1 tablet by mouth  daily. (Patient not taking: Reported on 05/08/2018) 30 tablet 2  . omeprazole (PRILOSEC) 20 MG capsule Take 1 capsule (20 mg total) by mouth daily. 30 minutes before breakfast (Patient not taking: Reported on 06/04/2018) 90 capsule 3   No current facility-administered medications for this visit.     Allergies as of 06/04/2018 - Review Complete 06/04/2018  Allergen Reaction Noted  . Clarithromycin Diarrhea and Other (See Comments) 02/22/2012  . Propoxyphene Itching 01/25/2018    Family History  Problem Relation Age of Onset  . Diabetes Father   . Colon cancer Maternal Grandmother   . Heart disease Paternal Grandmother   . Cancer Paternal Grandmother        esophageal  . Hypertension Paternal Grandmother   . GER disease Paternal Grandmother   . Osteoporosis Paternal Grandmother     Social History   Socioeconomic History  . Marital status: Divorced    Spouse name: Not on file  . Number of children: Not on file  . Years of education: Not on file  . Highest education level: Not on file  Occupational History  . Not on file  Social Needs  . Financial resource strain: Not on file  . Food insecurity:    Worry: Not on file    Inability: Not on file  . Transportation needs:    Medical: Not on file    Non-medical: Not on file  Tobacco Use  . Smoking status: Current Every Day Smoker    Packs/day: 0.50    Years: 27.00    Pack years: 13.50    Types: Cigarettes  . Smokeless tobacco: Never Used  Substance and Sexual Activity  . Alcohol use: Not Currently    Alcohol/week: 1.0 standard drinks    Types: 1 Cans of beer per week    Frequency: Never    Comment: hx binge drinker. none since 12/16/17  . Drug use: Not Currently    Types: Marijuana, Cocaine, Methamphetamines, Heroin    Comment: no cocaine, heroin, meth since 11-27-2017. last marijuana use 01-21-18  . Sexual activity: Yes    Birth control/protection: Surgical  Lifestyle  . Physical activity:    Days per week: Not on file     Minutes per session: Not on file  . Stress: Not on file  Relationships  . Social connections:    Talks on phone: Not on file    Gets together: Not on file    Attends religious service: Not on file    Active member of club or organization: Not on file    Attends meetings of clubs or organizations: Not on file    Relationship status: Not on file  Other Topics Concern  . Not on file  Social History Narrative  . Not on file    Review of Systems: Gen: Denies fever, chills, anorexia.  Denies fatigue, weakness, weight loss.  CV: Denies chest pain, palpitations, syncope, peripheral edema, and claudication. Resp: Denies dyspnea at rest, cough, wheezing, coughing up blood, and pleurisy. GI: see HPI  Derm: Denies rash, itching, dry skin Psych: Denies depression, anxiety, memory loss, confusion. No homicidal or suicidal ideation.  Heme: Denies bruising, bleeding, and enlarged lymph nodes.  Physical Exam: BP 120/78   Pulse 65   Temp 97.9 F (36.6 C) (Oral)   Ht 5\' 4"  (1.626 m)   Wt 237 lb 3.2 oz (107.6 kg)   LMP 05/14/2018   BMI 40.72 kg/m  General:   Alert and oriented. No distress noted. Pleasant and cooperative.  Head:  Normocephalic and atraumatic. Eyes:  Conjuctiva clear without scleral icterus. Mouth:  Oral mucosa pink and moist.  Abdomen:  +BS, soft, non-tender and non-distended. No rebound or guarding. No HSM or masses noted. Msk:  Symmetrical without gross deformities. Normal posture. Extremities:  Without edema. Neurologic:  Alert and  oriented x4 Psych:  Alert and cooperative. Normal mood and affect.  Lab Results  Component Value Date   ALT 104 (H) 03/26/2018   AST 67 (H) 03/26/2018   ALKPHOS 70 03/26/2018   BILITOT 0.4 03/26/2018   Lab Results  Component Value Date   WBC 7.1 03/26/2018   HGB 12.1 03/26/2018   HCT 37.3 03/26/2018   MCV 94.9 03/26/2018   PLT 261 03/26/2018   Lab Results  Component Value Date   CREATININE 0.72 03/26/2018   BUN 8  03/26/2018   NA 138 03/26/2018   K 4.1 03/26/2018   CL 107 03/26/2018   CO2 25 03/26/2018

## 2018-06-04 NOTE — Telephone Encounter (Signed)
Called [facility] and spoke with Sillia. She is aware patient start date is today and will get this updated in the system.

## 2018-06-06 NOTE — Progress Notes (Signed)
CC'D TO PCP °

## 2018-06-06 NOTE — Assessment & Plan Note (Signed)
42 year old female with history of chronic Hep C genotype 1a, Metavir F3/F4. First dose of Harvoni to start today, 9/16. Fully aware of dosing regimen, need to call with any medication changes. We have decreased omeprazole to 20 mg daily to take at same time of Harvoni. Will see her back in 4 weeks and complete labs at that time. [place] RUQ due in Jan 2020.

## 2018-06-21 NOTE — Telephone Encounter (Signed)
Spoke with Silia with [facility]. 2nd shipment is scheduled for delivery on 06/25/18.

## 2018-06-25 ENCOUNTER — Encounter: Payer: Self-pay | Admitting: Physician Assistant

## 2018-06-25 ENCOUNTER — Ambulatory Visit: Payer: Medicaid Other | Admitting: Physician Assistant

## 2018-06-25 VITALS — BP 117/77 | HR 65 | Temp 97.9°F | Ht 64.0 in | Wt 247.5 lb

## 2018-06-25 DIAGNOSIS — F39 Unspecified mood [affective] disorder: Secondary | ICD-10-CM

## 2018-06-25 DIAGNOSIS — K219 Gastro-esophageal reflux disease without esophagitis: Secondary | ICD-10-CM

## 2018-06-25 DIAGNOSIS — J449 Chronic obstructive pulmonary disease, unspecified: Secondary | ICD-10-CM

## 2018-06-25 DIAGNOSIS — F1911 Other psychoactive substance abuse, in remission: Secondary | ICD-10-CM

## 2018-06-25 DIAGNOSIS — B182 Chronic viral hepatitis C: Secondary | ICD-10-CM

## 2018-06-25 DIAGNOSIS — M25551 Pain in right hip: Secondary | ICD-10-CM

## 2018-06-25 DIAGNOSIS — K432 Incisional hernia without obstruction or gangrene: Secondary | ICD-10-CM

## 2018-06-25 DIAGNOSIS — E669 Obesity, unspecified: Secondary | ICD-10-CM

## 2018-06-25 DIAGNOSIS — E041 Nontoxic single thyroid nodule: Secondary | ICD-10-CM

## 2018-06-25 DIAGNOSIS — I10 Essential (primary) hypertension: Secondary | ICD-10-CM

## 2018-06-25 NOTE — Telephone Encounter (Signed)
Patient picked up 2nd shipment of medication

## 2018-06-25 NOTE — Telephone Encounter (Signed)
2nd shipment has been received. Called patient cell # [email or web address]. Called home # but line rings multiple times, NA and no VM.

## 2018-06-25 NOTE — Telephone Encounter (Signed)
Spoke with patient and is aware. Patient will come by today to pick up

## 2018-06-25 NOTE — Progress Notes (Signed)
BP 117/77 (BP Location: Right Arm, Patient Position: Sitting, Cuff Size: Normal)   Pulse 65   Temp 97.9 F (36.6 C)   Ht 5\' 4"  (1.626 m)   Wt 247 lb 8 oz (112.3 kg)   SpO2 94%   BMI 42.48 kg/m    Subjective:    Patient ID: Deborah Barnes, female    DOB: April 08, 1976, 42 y.o.   MRN: [id number]  HPI: Deborah Barnes is a 42 y.o. female presenting on 06/25/2018 for Hypertension and Palpitations   HPI  Pt is still at [place].  She is feeling well.  She says she has had no more palpitations since her levothyroxine returned to 29mcg daily  Pt is seeing GI for hepatitis and orthopedics for her hip.  She has not heard from general surgery yet (she was referred for hernia)  Relevant past medical, surgical, family and social history reviewed and updated as indicated. Interim medical history since our last visit reviewed. Allergies and medications reviewed and updated.   Current Outpatient Medications:  .  acetaminophen (TYLENOL) 500 MG tablet, Take 500 mg by mouth every 8 (eight) hours as needed., Disp: , Rfl:  .  albuterol (PROVENTIL HFA;VENTOLIN HFA) 108 (90 Base) MCG/ACT inhaler, Inhale 1-2 puffs into the lungs every 6 (six) hours as needed for wheezing or shortness of breath., Disp: 3 Inhaler, Rfl: 0 .  citalopram (CELEXA) 20 MG tablet, Take 1 tablet (20 mg total) by mouth daily., Disp: 30 tablet, Rfl: 11 .  ibuprofen (ADVIL,MOTRIN) 200 MG tablet, Take 800 mg by mouth every 8 (eight) hours as needed. , Disp: , Rfl:  .  Ledipasvir-Sofosbuvir (HARVONI) 90-400 MG TABS, Take 1 tablet by mouth daily., Disp: 30 tablet, Rfl: 2 .  levothyroxine (SYNTHROID, LEVOTHROID) 25 MCG tablet, Take 1 tablet (25 mcg total) by mouth daily before breakfast., Disp: 90 tablet, Rfl: 0 .  loratadine (CLARITIN) 10 MG tablet, Take 1 tablet (10 mg total) by mouth daily. For allergies, Disp: 30 tablet, Rfl: 11 .  metoprolol tartrate (LOPRESSOR) 50 MG tablet, Take 1 tablet (50 mg total) by mouth 2 (two)  times daily., Disp: 180 tablet, Rfl: 1 .  Naproxen Sodium (ALEVE PO), Take 2 tablets by mouth as needed. , Disp: , Rfl:  .  omeprazole (PRILOSEC) 20 MG capsule, Take 1 capsule (20 mg total) by mouth daily. 30 minutes before breakfast, Disp: 90 capsule, Rfl: 3 .  Simethicone (GAS RELIEF PO), Take 1 tablet by mouth as needed. , Disp: , Rfl:  .  traZODone (DESYREL) 100 MG tablet, Take 1 tablet (100 mg total) by mouth at bedtime as needed for sleep. (Patient taking differently: Take 100 mg by mouth at bedtime. ), Disp: 30 tablet, Rfl: 0 .  omeprazole (PRILOSEC) 40 MG capsule, Take 40 mg by mouth daily., Disp: , Rfl:   Review of Systems  Constitutional: Positive for diaphoresis and fatigue. Negative for appetite change, chills, fever and unexpected weight change.  HENT: Positive for dental problem, hearing loss and sneezing. Negative for congestion, drooling, ear pain, facial swelling, mouth sores, sore throat, trouble swallowing and voice change.   Eyes: Positive for discharge and visual disturbance. Negative for pain, redness and itching.  Respiratory: Positive for choking. Negative for cough, shortness of breath and wheezing.   Cardiovascular: Negative for chest pain, palpitations and leg swelling.  Gastrointestinal: Positive for abdominal pain, constipation and diarrhea. Negative for blood in stool and vomiting.  Endocrine: Positive for heat intolerance. Negative for cold intolerance  and polydipsia.  Genitourinary: Negative for decreased urine volume, dysuria and hematuria.  Musculoskeletal: Positive for arthralgias, back pain and gait problem.  Skin: Negative for rash.  Allergic/Immunologic: Positive for environmental allergies.  Neurological: Positive for headaches. Negative for seizures, syncope and light-headedness.  Hematological: Negative for adenopathy.  Psychiatric/Behavioral: Positive for agitation and dysphoric mood. Negative for suicidal ideas. The patient is nervous/anxious.      Per HPI unless specifically indicated above     Objective:    BP 117/77 (BP Location: Right Arm, Patient Position: Sitting, Cuff Size: Normal)   Pulse 65   Temp 97.9 F (36.6 C)   Ht 5\' 4"  (1.626 m)   Wt 247 lb 8 oz (112.3 kg)   SpO2 94%   BMI 42.48 kg/m   Wt Readings from Last 3 Encounters:  06/25/18 247 lb 8 oz (112.3 kg)  06/04/18 237 lb 3.2 oz (107.6 kg)  05/30/18 241 lb (109.3 kg)    Physical Exam  Constitutional: She is oriented to person, place, and time. She appears well-developed and well-nourished.  HENT:  Head: Normocephalic and atraumatic.  Neck: Neck supple.  Cardiovascular: Normal rate and regular rhythm.  Pulmonary/Chest: Effort normal and breath sounds normal.  Abdominal: Soft. Bowel sounds are normal. She exhibits no mass. There is no hepatosplenomegaly. There is no tenderness.  Musculoskeletal: She exhibits no edema.  Lymphadenopathy:    She has no cervical adenopathy.  Neurological: She is alert and oriented to person, place, and time.  Skin: Skin is warm and dry.  Psychiatric: She has a normal mood and affect. Her behavior is normal.  Vitals reviewed.       Assessment & Plan:    Encounter Diagnoses  Name Primary?  . Essential hypertension Yes  . Chronic hepatitis C without hepatic coma ([place])   . Thyroid nodule   . Incisional hernia, without obstruction or gangrene   . Right hip pain   . Chronic obstructive pulmonary disease, unspecified COPD type ([place])   . Gastroesophageal reflux disease, esophagitis presence not specified   . Substance abuse in remission ([place])   . Mood disorder ([place])   . Obesity, unspecified classification, unspecified obesity type, unspecified whether serious comorbidity present     -pt to continue current therapy -will have nurse check on surgery referral -pt to follow up 3 months.  RTO sooner prn

## 2018-07-02 ENCOUNTER — Telehealth: Payer: Self-pay

## 2018-07-02 ENCOUNTER — Other Ambulatory Visit (HOSPITAL_COMMUNITY)
Admission: RE | Admit: 2018-07-02 | Discharge: 2018-07-02 | Disposition: A | Discharge status: Home / Self Care | Payer: Medicaid Other | Source: Ambulatory Visit | Attending: Gastroenterology | Admitting: Gastroenterology

## 2018-07-02 DIAGNOSIS — B182 Chronic viral hepatitis C: Secondary | ICD-10-CM | POA: Insufficient documentation

## 2018-07-02 LAB — COMPREHENSIVE METABOLIC PANEL
ALK PHOS: 61 U/L (ref 38–126)
ALT: 13 U/L (ref 0–44)
ANION GAP: 5 (ref 5–15)
AST: 18 U/L (ref 15–41)
Albumin: 3.8 g/dL (ref 3.5–5.0)
BILIRUBIN TOTAL: 0.5 mg/dL (ref 0.3–1.2)
BUN: 12 mg/dL (ref 6–20)
CALCIUM: 8.9 mg/dL (ref 8.9–10.3)
CO2: 23 mmol/L (ref 22–32)
CREATININE: 0.78 mg/dL (ref 0.44–1.00)
Chloride: 106 mmol/L (ref 98–111)
GFR calc non Af Amer: >60 mL/min (ref >60)
Glucose, Bld: 102 mg/dL — ABNORMAL HIGH (ref 70–99)
Potassium: 3.9 mmol/L (ref 3.5–5.1)
Sodium: 134 mmol/L — ABNORMAL LOW (ref 135–145)
TOTAL PROTEIN: 7.3 g/dL (ref 6.5–8.1)

## 2018-07-02 LAB — CBC WITH DIFFERENTIAL/PLATELET
Abs Immature Granulocytes: 0.02 10*3/uL (ref 0.00–0.07)
BASOS ABS: 0 10*3/uL (ref 0.0–0.1)
Basophils Relative: 0 %
EOS ABS: 0.4 10*3/uL (ref 0.0–0.5)
EOS PCT: 4 %
HEMATOCRIT: 40.1 % (ref 36.0–46.0)
Hemoglobin: 13.1 g/dL (ref 12.0–15.0)
Immature Granulocytes: 0 %
Lymphocytes Relative: 33 %
Lymphs Abs: 3 10*3/uL (ref 0.7–4.0)
MCH: 31 pg (ref 26.0–34.0)
MCHC: 32.7 g/dL (ref 30.0–36.0)
MCV: 95 fL (ref 80.0–100.0)
Monocytes Absolute: 0.5 10*3/uL (ref 0.1–1.0)
Monocytes Relative: 6 %
NRBC: 0 % (ref 0.0–0.2)
Neutro Abs: 5 10*3/uL (ref 1.7–7.7)
Neutrophils Relative %: 57 %
Platelets: 279 10*3/uL (ref 150–400)
RBC: 4.22 MIL/uL (ref 3.87–5.11)
RDW: 12.9 % (ref 11.5–15.5)
WBC: 9 10*3/uL (ref 4.0–10.5)

## 2018-07-02 LAB — PROTIME-INR
INR: 0.9
PROTHROMBIN TIME: 12.1 s (ref 11.4–15.2)

## 2018-07-02 NOTE — Telephone Encounter (Signed)
Pt called asking for lab orders for her apt tomorrow with [facility]. Discussed with [facility] lab orders that were placed 04/2018 were faxed to [facility].

## 2018-07-03 ENCOUNTER — Encounter: Payer: Self-pay | Admitting: Gastroenterology

## 2018-07-03 ENCOUNTER — Ambulatory Visit (INDEPENDENT_AMBULATORY_CARE_PROVIDER_SITE_OTHER): Payer: Self-pay | Admitting: Gastroenterology

## 2018-07-03 VITALS — BP 115/75 | HR 66 | Temp 98.3°F | Ht 64.0 in | Wt 243.8 lb

## 2018-07-03 DIAGNOSIS — B182 Chronic viral hepatitis C: Secondary | ICD-10-CM

## 2018-07-03 LAB — HCV RNA QUANT: HCV Quantitative: NOT DETECTED IU/mL (ref >50)

## 2018-07-03 NOTE — Patient Instructions (Signed)
Continue Harvoni daily, taking omeprazole 20 milligrams at same time.   We will see you in 2 months!  I would like for you to complete the Harvoni treatment before pursuing hip surgery.  I enjoyed seeing you again today! As you know, I value our relationship and want to provide genuine, compassionate, and quality care. I welcome your feedback. If you receive a survey regarding your visit,  I greatly appreciate you taking time to fill this out. See you next time!  Annitta Needs, PhD, ANP-BC [facility]

## 2018-07-03 NOTE — Progress Notes (Signed)
Referring Provider: Soyla Dryer, PA-C Primary Care Physician:  Soyla Dryer, PA-C  Primary GI: Dr. Gala Romney    Chief Complaint  Patient presents with  . Hepatitis C    taking Harvoni; done labs yesterday    HPI:   Deborah Barnes is a 42 y.o. female presenting today with a history of chronic Hep C genotype 1a, Metavir F3/F4. No evidence of chronic Hep B. Needs Hep A and B vaccinations. She has completed one month of Harvoni. First dose on 9/16. Labs completed yesterday and still pending.   No abdominal pain. No N/V. Felt fatigued at first but better. In process of evaluation for hip replacement. Discussed would be best to wait until Harvoni is completed so no issues with dosing. Continues Prilosec 20 mg at same time as Harvoni, and she notes good GERD control.   Past Medical History:  Diagnosis Date  . Asthma   . Back pain   . Bulging disc   . COPD (chronic obstructive pulmonary disease) ([place])   . Degenerative disc disease   . Depression   . Depression with anxiety   . GERD (gastroesophageal reflux disease)   . Hepatitis C 08/2017  . Hypercholesterolemia   . Hypertension   . PTSD (post-traumatic stress disorder)   . Sciatica   . Shortness of breath   . Thyroid disease     Past Surgical History:  Procedure Laterality Date  . BIOPSY N/A 09/25/2014   Procedure: BIOPSY;  Surgeon: Daneil Dolin, MD;  Location: [facility];  Service: Endoscopy;  Laterality: N/A;  Gastric, Ascending Colon, Descending/Sigmoid Colon   . CARPAL TUNNEL RELEASE Bilateral   . CHOLECYSTECTOMY N/A 04/26/2013   Procedure: LAPAROSCOPIC CHOLECYSTECTOMY;  Surgeon: Jamesetta So, MD;  Location: [facility];  Service: General;  Laterality: N/A;  . COLONOSCOPY WITH PROPOFOL N/A 09/25/2014   RMR: Pancolonic diverticulosis. multiple tubular adenomas, segmental biopsies negative. surveillance in 5 years  . ESOPHAGOGASTRODUODENOSCOPY (EGD) WITH PROPOFOL N/A 09/25/2014   RMR: Normal esophagus. Abnormal gastric mucosa  status post biopsy (negative H.pylori). Hiatal hernia.   Fatima Blank HERNIA REPAIR N/A 10/04/2013   Procedure: HERNIA REPAIR INCISIONAL WITH MESH;  Surgeon: Jamesetta So, MD;  Location: [facility];  Service: General;  Laterality: N/A;  . INSERTION OF MESH N/A 10/04/2013   Procedure: INSERTION OF MESH;  Surgeon: Jamesetta So, MD;  Location: [facility];  Service: General;  Laterality: N/A;  . POLYPECTOMY N/A 09/25/2014   Procedure: POLYPECTOMY;  Surgeon: Daneil Dolin, MD;  Location: [facility];  Service: Endoscopy;  Laterality: N/A;  Cecal, Ascending Colon   . SHOULDER SURGERY    . TUBAL LIGATION      Current Outpatient Medications  Medication Sig Dispense Refill  . acetaminophen (TYLENOL) 500 MG tablet Take 500 mg by mouth every 8 (eight) hours as needed.    Marland Kitchen albuterol (PROVENTIL HFA;VENTOLIN HFA) 108 (90 Base) MCG/ACT inhaler Inhale 1-2 puffs into the lungs every 6 (six) hours as needed for wheezing or shortness of breath. 3 Inhaler 0  . citalopram (CELEXA) 20 MG tablet Take 1 tablet (20 mg total) by mouth daily. 30 tablet 11  . ibuprofen (ADVIL,MOTRIN) 200 MG tablet Take 800 mg by mouth every 8 (eight) hours as needed.     . Ledipasvir-Sofosbuvir (HARVONI) 90-400 MG TABS Take 1 tablet by mouth daily. 30 tablet 2  . levothyroxine (SYNTHROID, LEVOTHROID) 25 MCG tablet Take 1 tablet (25 mcg total) by mouth daily before breakfast. 90 tablet 0  .  loratadine (CLARITIN) 10 MG tablet Take 1 tablet (10 mg total) by mouth daily. For allergies 30 tablet 11  . metoprolol tartrate (LOPRESSOR) 50 MG tablet Take 1 tablet (50 mg total) by mouth 2 (two) times daily. 180 tablet 1  . Naproxen Sodium (ALEVE PO) Take 2 tablets by mouth as needed.     Marland Kitchen omeprazole (PRILOSEC) 20 MG capsule Take 1 capsule (20 mg total) by mouth daily. 30 minutes before breakfast 90 capsule 3  . Simethicone (GAS RELIEF PO) Take 1 tablet by mouth as needed.     . traZODone (DESYREL) 100 MG tablet Take 1 tablet (100 mg total) by mouth at  bedtime as needed for sleep. (Patient taking differently: Take 100 mg by mouth at bedtime. ) 30 tablet 0   No current facility-administered medications for this visit.     Allergies as of 07/03/2018 - Review Complete 07/03/2018  Allergen Reaction Noted  . Clarithromycin Diarrhea and Other (See Comments) 02/22/2012  . Propoxyphene Itching 01/25/2018    Family History  Problem Relation Age of Onset  . Diabetes Father   . Colon cancer Maternal Grandmother   . Heart disease Paternal Grandmother   . Cancer Paternal Grandmother        esophageal  . Hypertension Paternal Grandmother   . GER disease Paternal Grandmother   . Osteoporosis Paternal Grandmother     Social History   Socioeconomic History  . Marital status: Divorced    Spouse name: Not on file  . Number of children: Not on file  . Years of education: Not on file  . Highest education level: Not on file  Occupational History  . Not on file  Social Needs  . Financial resource strain: Not on file  . Food insecurity:    Worry: Not on file    Inability: Not on file  . Transportation needs:    Medical: Not on file    Non-medical: Not on file  Tobacco Use  . Smoking status: Current Every Day Smoker    Packs/day: 0.50    Years: 27.00    Pack years: 13.50    Types: Cigarettes  . Smokeless tobacco: Never Used  Substance and Sexual Activity  . Alcohol use: Not Currently    Alcohol/week: 1.0 standard drinks    Types: 1 Cans of beer per week    Frequency: Never    Comment: hx binge drinker. none since 12/16/17  . Drug use: Not Currently    Types: Marijuana, Cocaine, Methamphetamines, Heroin    Comment: no cocaine, heroin, meth since 11-27-2017. last marijuana use 01-21-18  . Sexual activity: Yes    Birth control/protection: Surgical  Lifestyle  . Physical activity:    Days per week: Not on file    Minutes per session: Not on file  . Stress: Not on file  Relationships  . Social connections:    Talks on phone: Not on  file    Gets together: Not on file    Attends religious service: Not on file    Active member of club or organization: Not on file    Attends meetings of clubs or organizations: Not on file    Relationship status: Not on file  Other Topics Concern  . Not on file  Social History Narrative  . Not on file    Review of Systems: Gen: Denies fever, chills, anorexia. Denies fatigue, weakness, weight loss.  CV: Denies chest pain, palpitations, syncope, peripheral edema, and claudication. Resp: Denies dyspnea at rest,  cough, wheezing, coughing up blood, and pleurisy. GI: see HPI  Derm: Denies rash, itching, dry skin Psych: Denies depression, anxiety, memory loss, confusion. No homicidal or suicidal ideation.  Heme: Denies bruising, bleeding, and enlarged lymph nodes.  Physical Exam: BP 115/75   Pulse 66   Temp 98.3 F (36.8 C) (Oral)   Ht 5\' 4"  (1.626 m)   Wt 243 lb 12.8 oz (110.6 kg)   LMP 06/04/2018 (Approximate)   BMI 41.85 kg/m  General:   Alert and oriented. No distress noted. Pleasant and cooperative.  Head:  Normocephalic and atraumatic. Eyes:  Conjuctiva clear without scleral icterus. Mouth:  Oral mucosa pink and moist.  Abdomen:  +BS, soft, non-tender and non-distended. No rebound or guarding. No HSM or masses noted. Msk:  Symmetrical without gross deformities. Normal posture. Extremities:  Without edema. Neurologic:  Alert and  oriented x4 Psych:  Alert and cooperative. Normal mood and affect.  Lab Results  Component Value Date   WBC 9.0 07/02/2018   HGB 13.1 07/02/2018   HCT 40.1 07/02/2018   MCV 95.0 07/02/2018   PLT 279 07/02/2018   Lab Results  Component Value Date   ALT 13 07/02/2018   AST 18 07/02/2018   ALKPHOS 61 07/02/2018   BILITOT 0.5 07/02/2018   Lab Results  Component Value Date   CREATININE 0.78 07/02/2018   BUN 12 07/02/2018   NA 134 (L) 07/02/2018   K 3.9 07/02/2018   CL 106 07/02/2018   CO2 23 07/02/2018

## 2018-07-04 ENCOUNTER — Encounter: Payer: Self-pay | Admitting: Orthopedic Surgery

## 2018-07-04 ENCOUNTER — Encounter

## 2018-07-04 ENCOUNTER — Telehealth: Payer: Self-pay | Admitting: Radiology

## 2018-07-04 ENCOUNTER — Ambulatory Visit (INDEPENDENT_AMBULATORY_CARE_PROVIDER_SITE_OTHER): Payer: Self-pay | Admitting: Orthopedic Surgery

## 2018-07-04 VITALS — BP 122/84 | HR 74 | Ht 64.0 in | Wt 244.0 lb

## 2018-07-04 DIAGNOSIS — M1611 Unilateral primary osteoarthritis, right hip: Secondary | ICD-10-CM

## 2018-07-04 NOTE — Telephone Encounter (Signed)
Wed Thurs or Friday for Hip injection at [facility]

## 2018-07-04 NOTE — Patient Instructions (Addendum)
For your BMI to be under 40, your weight should be 230 lbs  We will send referral to [facility] they may be able to help with your

## 2018-07-04 NOTE — Progress Notes (Signed)
In office consult preop surgery   Chief Complaint  Patient presents with  . Hip Pain    right     42 yo female with > 5 yrs severe dull aching right hip pain in the groin and anterior thigh but no history of trauma.  Past Medical History: No date: Asthma No date: Back pain No date: Bulging disc No date: COPD (chronic obstructive pulmonary disease) (HCC) No date: Degenerative disc disease No date: Depression No date: Depression with anxiety No date: GERD (gastroesophageal reflux disease) 08/2017: Hepatitis C No date: Hypercholesterolemia No date: Hypertension No date: PTSD (post-traumatic stress disorder) No date: Sciatica No date: Shortness of breath No date: Thyroid disease  Complains of severe pain, she is at a local home for women with substance abuse issues cannot have any oral opioids.  Currently taking Tylenol without relief uses a cane which is also not providing good relief    Review of Systems  Constitutional: Negative for fever.  Respiratory: Negative for shortness of breath.   Cardiovascular: Negative for chest pain.  Skin: Negative.   Neurological: Negative for tingling and sensory change.     Past Medical History:  Diagnosis Date  . Asthma   . Back pain   . Bulging disc   . COPD (chronic obstructive pulmonary disease) ([place])   . Degenerative disc disease   . Depression   . Depression with anxiety   . GERD (gastroesophageal reflux disease)   . Hepatitis C 08/2017  . Hypercholesterolemia   . Hypertension   . PTSD (post-traumatic stress disorder)   . Sciatica   . Shortness of breath   . Thyroid disease     Past Surgical History:  Procedure Laterality Date  . BIOPSY N/A 09/25/2014   Procedure: BIOPSY;  Surgeon: Daneil Dolin, MD;  Location: [facility];  Service: Endoscopy;  Laterality: N/A;  Gastric, Ascending Colon, Descending/Sigmoid Colon   . CARPAL TUNNEL RELEASE Bilateral   . CHOLECYSTECTOMY N/A 04/26/2013   Procedure: LAPAROSCOPIC  CHOLECYSTECTOMY;  Surgeon: Jamesetta So, MD;  Location: [facility];  Service: General;  Laterality: N/A;  . COLONOSCOPY WITH PROPOFOL N/A 09/25/2014   RMR: Pancolonic diverticulosis. multiple tubular adenomas, segmental biopsies negative. surveillance in 5 years  . ESOPHAGOGASTRODUODENOSCOPY (EGD) WITH PROPOFOL N/A 09/25/2014   RMR: Normal esophagus. Abnormal gastric mucosa status post biopsy (negative H.pylori). Hiatal hernia.   Fatima Blank HERNIA REPAIR N/A 10/04/2013   Procedure: HERNIA REPAIR INCISIONAL WITH MESH;  Surgeon: Jamesetta So, MD;  Location: [facility];  Service: General;  Laterality: N/A;  . INSERTION OF MESH N/A 10/04/2013   Procedure: INSERTION OF MESH;  Surgeon: Jamesetta So, MD;  Location: [facility];  Service: General;  Laterality: N/A;  . POLYPECTOMY N/A 09/25/2014   Procedure: POLYPECTOMY;  Surgeon: Daneil Dolin, MD;  Location: [facility];  Service: Endoscopy;  Laterality: N/A;  Cecal, Ascending Colon   . SHOULDER SURGERY    . TUBAL LIGATION      Family History  Problem Relation Age of Onset  . Diabetes Father   . Colon cancer Maternal Grandmother   . Heart disease Paternal Grandmother   . Cancer Paternal Grandmother        esophageal  . Hypertension Paternal Grandmother   . GER disease Paternal Grandmother   . Osteoporosis Paternal Grandmother    Social History   Tobacco Use  . Smoking status: Current Every Day Smoker    Packs/day: 0.50    Years: 27.00    Pack  years: 13.50    Types: Cigarettes  . Smokeless tobacco: Never Used  Substance Use Topics  . Alcohol use: Not Currently    Alcohol/week: 1.0 standard drinks    Types: 1 Cans of beer per week    Frequency: Never    Comment: hx binge drinker. none since 12/16/17  . Drug use: Not Currently    Types: Marijuana, Cocaine, Methamphetamines, Heroin    Comment: no cocaine, heroin, meth since 11-27-2017. last marijuana use 01-21-18    Allergies  Allergen Reactions  . Clarithromycin Diarrhea and Other (See Comments)     Diarrhea and abd pain  . Propoxyphene Itching    Current Meds  Medication Sig  . acetaminophen (TYLENOL) 500 MG tablet Take 500 mg by mouth every 8 (eight) hours as needed.  Marland Kitchen albuterol (PROVENTIL HFA;VENTOLIN HFA) 108 (90 Base) MCG/ACT inhaler Inhale 1-2 puffs into the lungs every 6 (six) hours as needed for wheezing or shortness of breath.  . citalopram (CELEXA) 20 MG tablet Take 1 tablet (20 mg total) by mouth daily.  Marland Kitchen ibuprofen (ADVIL,MOTRIN) 200 MG tablet Take 800 mg by mouth every 8 (eight) hours as needed.   . Ledipasvir-Sofosbuvir (HARVONI) 90-400 MG TABS Take 1 tablet by mouth daily.  Marland Kitchen levothyroxine (SYNTHROID, LEVOTHROID) 25 MCG tablet Take 1 tablet (25 mcg total) by mouth daily before breakfast.  . loratadine (CLARITIN) 10 MG tablet Take 1 tablet (10 mg total) by mouth daily. For allergies  . metoprolol tartrate (LOPRESSOR) 50 MG tablet Take 1 tablet (50 mg total) by mouth 2 (two) times daily.  . Naproxen Sodium (ALEVE PO) Take 2 tablets by mouth as needed.   Marland Kitchen omeprazole (PRILOSEC) 20 MG capsule Take 1 capsule (20 mg total) by mouth daily. 30 minutes before breakfast  . Simethicone (GAS RELIEF PO) Take 1 tablet by mouth as needed.   . traZODone (DESYREL) 100 MG tablet Take 1 tablet (100 mg total) by mouth at bedtime as needed for sleep.    BP 122/84   Pulse 74   Ht 5\' 4"  (1.626 m)   Wt 244 lb (110.7 kg)   LMP 06/04/2018 (Approximate)   BMI 41.88 kg/m   Physical Exam  Constitutional: She is oriented to person, place, and time. She appears well-developed and well-nourished.  Neurological: She is alert and oriented to person, place, and time.  Psychiatric: She has a normal mood and affect. Judgment normal.  Vitals reviewed.   Right Hip Exam   Tenderness  The patient is experiencing tenderness in the anterior.  Range of Motion  Flexion: 100   Muscle Strength  The patient has normal right hip strength.  Tests  FABER: negative  Other  Erythema:  absent Sensation: normal Pulse: present  Comments:  HIP STABILITY NORMAL    Left Hip Exam  Left hip exam is normal.  Tenderness  The patient is experiencing no tenderness.   Range of Motion  The patient has normal left hip ROM. Flexion: 120  External rotation: 70  Internal rotation: 15   Muscle Strength  The patient has normal left hip strength.   Tests  FABER: negative  Other  Erythema: absent Sensation: normal Pulse: present  Comments:  HIP STABILITY NORMAL         MEDICAL DECISION SECTION  Independent interpretation Xrays were done previously on September 03, 2017 she has advanced aware of the right hip [place] to disease   Encounter Diagnosis  Name Primary?  . Primary osteoarthritis of right hip Yes  PLAN: (Rx., injectx, surgery, frx, mri/ct) Unfortunately she is not a good surgical candidate for several reasons 1 she has a BMI greater than 40, 2 she is going to have a postop issue with pain management, 3 she is high infection risk, for she is too young.  We offered her injection nutritionist referral to 1 of the medical centers but unfortunately I cannot do her hip replacement surgery  No orders of the defined types were placed in this encounter.   Arther Abbott, MD  07/04/2018 2:05 PM

## 2018-07-05 NOTE — Telephone Encounter (Signed)
I called patient about the hip injection appointment   No answer

## 2018-07-05 NOTE — Telephone Encounter (Signed)
I called her again to advise still no answer They only do these on Mondays or Tuesdays she is sch for Monday Oct 21st at 9:30 arrive 15 minutes early

## 2018-07-05 NOTE — Telephone Encounter (Signed)
Called to schedule hip injection Right Monday 9:30 am

## 2018-07-09 ENCOUNTER — Encounter (HOSPITAL_COMMUNITY): Payer: Self-pay

## 2018-07-09 ENCOUNTER — Ambulatory Visit (HOSPITAL_COMMUNITY)
Admission: RE | Admit: 2018-07-09 | Discharge: 2018-07-09 | Disposition: A | Discharge status: Home / Self Care | Payer: Self-pay | Source: Ambulatory Visit | Attending: Orthopedic Surgery | Admitting: Orthopedic Surgery

## 2018-07-09 DIAGNOSIS — M1611 Unilateral primary osteoarthritis, right hip: Secondary | ICD-10-CM | POA: Insufficient documentation

## 2018-07-09 MED ORDER — LIDOCAINE HCL (PF) 1 % IJ SOLN
INTRAMUSCULAR | Status: AC
  Administered 2018-07-09: 9 mL
  Filled 2018-07-09: qty 10

## 2018-07-09 MED ORDER — POVIDONE-IODINE 10 % EX SOLN
CUTANEOUS | Status: AC
  Filled 2018-07-09: qty 15

## 2018-07-09 MED ORDER — METHYLPREDNISOLONE ACETATE 40 MG/ML IJ SUSP
INTRAMUSCULAR | Status: AC
  Administered 2018-07-09: 40 mg
  Filled 2018-07-09: qty 1

## 2018-07-09 MED ORDER — IOPAMIDOL (ISOVUE-300) INJECTION 61%
INTRAVENOUS | Status: AC
  Administered 2018-07-09: 2 mL
  Filled 2018-07-09: qty 50

## 2018-07-09 NOTE — Assessment & Plan Note (Signed)
Hep C genotype 1a, Metavir F3/F4, completing one month of therapy thus far. No concerning side effects, taking doses correctly, continues with Prilosec 20 mg at same time as Harvoni. Return in 2 months. Follow-up on pending labs from today. [place] RUQ will be due in Jan 2020.

## 2018-07-09 NOTE — Progress Notes (Signed)
CC'D TO PCP °

## 2018-07-09 NOTE — Progress Notes (Signed)
Deborah Barnes: your viral load is undetectable! Continue taking Harvoni. We will recheck this again at end of treatment and then 3 months after treatment. Sent in Deborah Barnes.

## 2018-07-09 NOTE — Procedures (Signed)
Preprocedure Dx: Osteoarthritis of right hip Postprocedure Dx: Osteoarthritis of right hip Procedure  Fluoroscopically guided therapeutic RIGHT hip joint injection Radiologist:  Thornton Papas Anesthesia:  6 ml of 1% lidocaine Injectate:  40 mg Depo=Medrol, 3 ml 1% lidocaine Fluoro time:  1 minutes 0 seconds EBL:   None Complications: None

## 2018-07-10 ENCOUNTER — Telehealth: Payer: Self-pay | Admitting: *Deleted

## 2018-07-10 NOTE — Telephone Encounter (Signed)
Spoke with [employer]. Last shipment for harvoni is scheduled for Thursday 07/12/18.

## 2018-07-11 ENCOUNTER — Other Ambulatory Visit: Payer: Self-pay | Admitting: Physician Assistant

## 2018-07-12 NOTE — Telephone Encounter (Signed)
Patient's medication has arrived. LMOVM

## 2018-07-13 NOTE — Telephone Encounter (Signed)
Patient picked up 3rd shipment of medication

## 2018-07-13 NOTE — Telephone Encounter (Signed)
Patient aware and will come by the office to pick up medication

## 2018-07-17 ENCOUNTER — Ambulatory Visit: Payer: Medicaid Other | Admitting: Physician Assistant

## 2018-07-17 ENCOUNTER — Encounter: Payer: Self-pay | Admitting: Physician Assistant

## 2018-07-17 VITALS — BP 140/79 | HR 75 | Ht 64.0 in | Wt 249.0 lb

## 2018-07-17 DIAGNOSIS — K649 Unspecified hemorrhoids: Secondary | ICD-10-CM

## 2018-07-17 MED ORDER — HYDROCORTISONE 2.5 % EX CREA: TOPICAL_CREAM | CUTANEOUS | 0 refills | Status: DC

## 2018-07-17 NOTE — Progress Notes (Signed)
BP 140/79 (BP Location: Right Arm, Patient Position: Sitting, Cuff Size: Normal)   Pulse 75   Ht 5\' 4"  (1.626 m)   Wt 249 lb (112.9 kg)   LMP 07/09/2018   SpO2 95%   BMI 42.74 kg/m    Subjective:    Patient ID: Deborah Barnes, female    DOB: 05-15-1976, 42 y.o.   MRN: [id number]  HPI: Deborah Barnes is a 42 y.o. female presenting on 07/17/2018 for Hemorrhoids   HPI  Chief Complaint  Patient presents with  . Hemorrhoids    Pt has been cleaning her hemorrhoids with wipes and using OTC preparation H ointment.  She says she has had hemorrhoids in the past. She denies constipation.   Relevant past medical, surgical, family and social history reviewed and updated as indicated. Interim medical history since our last visit reviewed. Allergies and medications reviewed and updated.   Current Outpatient Medications:  .  acetaminophen (TYLENOL) 500 MG tablet, Take 500 mg by mouth every 8 (eight) hours as needed., Disp: , Rfl:  .  albuterol (PROVENTIL HFA;VENTOLIN HFA) 108 (90 Base) MCG/ACT inhaler, Inhale 1-2 puffs into the lungs every 6 (six) hours as needed for wheezing or shortness of breath., Disp: 3 Inhaler, Rfl: 0 .  citalopram (CELEXA) 20 MG tablet, Take 1 tablet (20 mg total) by mouth daily., Disp: 30 tablet, Rfl: 11 .  ibuprofen (ADVIL,MOTRIN) 200 MG tablet, Take 800 mg by mouth every 8 (eight) hours as needed. , Disp: , Rfl:  .  Ledipasvir-Sofosbuvir (HARVONI) 90-400 MG TABS, Take 1 tablet by mouth daily., Disp: 30 tablet, Rfl: 2 .  levothyroxine (SYNTHROID, LEVOTHROID) 25 MCG tablet, Take 1 tablet (25 mcg total) by mouth daily before breakfast., Disp: 90 tablet, Rfl: 0 .  loratadine (CLARITIN) 10 MG tablet, Take 1 tablet (10 mg total) by mouth daily. For allergies, Disp: 30 tablet, Rfl: 11 .  metoprolol tartrate (LOPRESSOR) 50 MG tablet, TAKE 1 Tablet  BY MOUTH TWICE DAILY, Disp: 180 tablet, Rfl: 1 .  Naproxen Sodium (ALEVE PO), Take 2 tablets by mouth as needed. , Disp: ,  Rfl:  .  omeprazole (PRILOSEC) 20 MG capsule, Take 1 capsule (20 mg total) by mouth daily. 30 minutes before breakfast, Disp: 90 capsule, Rfl: 3 .  Simethicone (GAS RELIEF PO), Take 1 tablet by mouth as needed. , Disp: , Rfl:  .  traZODone (DESYREL) 100 MG tablet, Take 1 tablet (100 mg total) by mouth at bedtime as needed for sleep., Disp: 30 tablet, Rfl: 0     Review of Systems  Constitutional: Positive for diaphoresis and fatigue. Negative for appetite change, chills, fever and unexpected weight change.  HENT: Positive for congestion, dental problem, hearing loss and sneezing. Negative for drooling, ear pain, facial swelling, mouth sores, sore throat, trouble swallowing and voice change.   Eyes: Positive for pain, discharge, redness, itching and visual disturbance.  Respiratory: Positive for choking, shortness of breath and wheezing. Negative for cough.   Cardiovascular: Positive for palpitations. Negative for chest pain and leg swelling.  Gastrointestinal: Positive for constipation and diarrhea. Negative for abdominal pain, blood in stool and vomiting.  Endocrine: Positive for heat intolerance. Negative for cold intolerance and polydipsia.  Genitourinary: Negative for decreased urine volume, dysuria and hematuria.  Musculoskeletal: Positive for arthralgias, back pain and gait problem.  Skin: Negative for rash.  Allergic/Immunologic: Positive for environmental allergies.  Neurological: Negative for seizures, syncope, light-headedness and headaches.  Hematological: Negative for adenopathy.  Psychiatric/Behavioral: Positive  for dysphoric mood. Negative for agitation and suicidal ideas. The patient is nervous/anxious.     Per HPI unless specifically indicated above     Objective:    BP 140/79 (BP Location: Right Arm, Patient Position: Sitting, Cuff Size: Normal)   Pulse 75   Ht 5\' 4"  (1.626 m)   Wt 249 lb (112.9 kg)   LMP 07/09/2018   SpO2 95%   BMI 42.74 kg/m   Wt Readings from  Last 3 Encounters:  07/17/18 249 lb (112.9 kg)  07/04/18 244 lb (110.7 kg)  07/03/18 243 lb 12.8 oz (110.6 kg)    Physical Exam  Constitutional: She is oriented to person, place, and time. She appears well-developed and well-nourished.  HENT:  Head: Normocephalic and atraumatic.  Pulmonary/Chest: Effort normal. No respiratory distress.  Genitourinary: Rectal exam shows external hemorrhoid. Rectal exam shows no fissure and no tenderness.  Genitourinary Comments: None thrombosed (nurse Berenice chaperone)  Neurological: She is alert and oriented to person, place, and time.  Skin: Skin is warm and dry.  Psychiatric: She has a normal mood and affect. Her behavior is normal.  Nursing note and vitals reviewed.       Assessment & Plan:    Encounter Diagnosis  Name Primary?  . Hemorrhoids, unspecified hemorrhoid type Yes    -rx 2.5% hydrocortisone cream to be dispensed with rectal cream applicator.  Pt unable to afford anusol HC. -recommended fiber, stool softners, sitz baths, cleaning with tucks/witch hazel.  Gave pt reading information.   -pt to follow up as scheduled. RTO sooner prn

## 2018-07-17 NOTE — Patient Instructions (Signed)
Hemorrhoids Hemorrhoids are swollen veins in and around the rectum or anus. There are two types of hemorrhoids:  Internal hemorrhoids. These occur in the veins that are just inside the rectum. They may poke through to the outside and become irritated and painful.  External hemorrhoids. These occur in the veins that are outside of the anus and can be felt as a painful swelling or hard lump near the anus.  Most hemorrhoids do not cause serious problems, and they can be managed with home treatments such as diet and lifestyle changes. If home treatments do not help your symptoms, procedures can be done to shrink or remove the hemorrhoids. What are the causes? This condition is caused by increased pressure in the anal area. This pressure may result from various things, including:  Constipation.  Straining to have a bowel movement.  Diarrhea.  Pregnancy.  Obesity.  Sitting for long periods of time.  Heavy lifting or other activity that causes you to strain.  Anal sex.  What are the signs or symptoms? Symptoms of this condition include:  Pain.  Anal itching or irritation.  Rectal bleeding.  Leakage of stool (feces).  Anal swelling.  One or more lumps around the anus.  How is this diagnosed? This condition can often be diagnosed through a visual exam. Other exams or tests may also be done, such as:  Examination of the rectal area with a gloved hand (digital rectal exam).  Examination of the anal canal using a small tube (anoscope).  A blood test, if you have lost a significant amount of blood.  A test to look inside the colon (sigmoidoscopy or colonoscopy).  How is this treated? This condition can usually be treated at home. However, various procedures may be done if dietary changes, lifestyle changes, and other home treatments do not help your symptoms. These procedures can help make the hemorrhoids smaller or remove them completely. Some of these procedures involve  surgery, and others do not. Common procedures include:  Rubber band ligation. Rubber bands are placed at the base of the hemorrhoids to cut off the blood supply to them.  Sclerotherapy. Medicine is injected into the hemorrhoids to shrink them.  Infrared coagulation. A type of light energy is used to get rid of the hemorrhoids.  Hemorrhoidectomy surgery. The hemorrhoids are surgically removed, and the veins that supply them are tied off.  Stapled hemorrhoidopexy surgery. A circular stapling device is used to remove the hemorrhoids and use staples to cut off the blood supply to them.  Follow these instructions at home: Eating and drinking  Eat foods that have a lot of fiber in them, such as whole grains, beans, nuts, fruits, and vegetables. Ask your health care provider about taking products that have added fiber (fiber supplements).  Drink enough fluid to keep your urine clear or pale yellow. Managing pain and swelling  Take warm sitz baths for 20 minutes, 3-4 times a day to ease pain and discomfort.  If directed, apply ice to the affected area. Using ice packs between sitz baths may be helpful. ? Put ice in a plastic bag. ? Place a towel between your skin and the bag. ? Leave the ice on for 20 minutes, 2-3 times a day. General instructions  Take over-the-counter and prescription medicines only as told by your health care provider.  Use medicated creams or suppositories as told.  Exercise regularly.  Go to the bathroom when you have the urge to have a bowel movement. Do not wait.    Avoid straining to have bowel movements.  Keep the anal area dry and clean. Use wet toilet paper or moist towelettes after a bowel movement.  Do not sit on the toilet for long periods of time. This increases blood pooling and pain. Contact a health care provider if:  You have increasing pain and swelling that are not controlled by treatment or medicine.  You have uncontrolled bleeding.  You  have difficulty having a bowel movement, or you are unable to have a bowel movement.  You have pain or inflammation outside the area of the hemorrhoids. This information is not intended to replace advice given to you by your health care provider. Make sure you discuss any questions you have with your health care provider. Document Released: 09/02/2000 Document Revised: 02/03/2016 Document Reviewed: 05/20/2015 [employer] Interactive Patient Education  2018 [employer]  

## 2018-07-18 ENCOUNTER — Other Ambulatory Visit: Payer: Self-pay | Admitting: Physician Assistant

## 2018-07-19 ENCOUNTER — Encounter: Payer: Self-pay | Admitting: General Surgery

## 2018-07-19 ENCOUNTER — Ambulatory Visit (INDEPENDENT_AMBULATORY_CARE_PROVIDER_SITE_OTHER): Payer: Self-pay | Admitting: General Surgery

## 2018-07-19 VITALS — BP 135/88 | HR 61 | Temp 96.9°F | Resp 18 | Wt 248.6 lb

## 2018-07-19 DIAGNOSIS — R1901 Right upper quadrant abdominal swelling, mass and lump: Secondary | ICD-10-CM

## 2018-07-19 NOTE — Progress Notes (Signed)
Deborah Barnes [id number]; 1976/01/27   HPI Patient is a 42 year old white female who was [facility] treatment of a possible recurrent hernia.  Patient is status post an incisional herniorrhaphy with mesh at an epigastric trocar site in 2015.  She states she has noted some swelling in the right upper portion of her abdomen.  She denies any nausea or vomiting.  She denies any pain.  She has gained weight since her surgery. Past Medical History:  Diagnosis Date  . Asthma   . Back pain   . Bulging disc   . COPD (chronic obstructive pulmonary disease) ([place])   . Degenerative disc disease   . Depression   . Depression with anxiety   . GERD (gastroesophageal reflux disease)   . Hepatitis C 08/2017  . Hypercholesterolemia   . Hypertension   . PTSD (post-traumatic stress disorder)   . Sciatica   . Shortness of breath   . Thyroid disease     Past Surgical History:  Procedure Laterality Date  . BIOPSY N/A 09/25/2014   Procedure: BIOPSY;  Surgeon: Daneil Dolin, MD;  Location: [facility];  Service: Endoscopy;  Laterality: N/A;  Gastric, Ascending Colon, Descending/Sigmoid Colon   . CARPAL TUNNEL RELEASE Bilateral   . CHOLECYSTECTOMY N/A 04/26/2013   Procedure: LAPAROSCOPIC CHOLECYSTECTOMY;  Surgeon: Jamesetta So, MD;  Location: [facility];  Service: General;  Laterality: N/A;  . COLONOSCOPY WITH PROPOFOL N/A 09/25/2014   RMR: Pancolonic diverticulosis. multiple tubular adenomas, segmental biopsies negative. surveillance in 5 years  . ESOPHAGOGASTRODUODENOSCOPY (EGD) WITH PROPOFOL N/A 09/25/2014   RMR: Normal esophagus. Abnormal gastric mucosa status post biopsy (negative H.pylori). Hiatal hernia.   Fatima Blank HERNIA REPAIR N/A 10/04/2013   Procedure: HERNIA REPAIR INCISIONAL WITH MESH;  Surgeon: Jamesetta So, MD;  Location: [facility];  Service: General;  Laterality: N/A;  . INSERTION OF MESH N/A 10/04/2013   Procedure: INSERTION OF MESH;  Surgeon: Jamesetta So, MD;  Location: [facility];  Service: General;  Laterality: N/A;  . POLYPECTOMY N/A 09/25/2014   Procedure: POLYPECTOMY;  Surgeon: Daneil Dolin, MD;  Location: [facility];  Service: Endoscopy;  Laterality: N/A;  Cecal, Ascending Colon   . SHOULDER SURGERY    . TUBAL LIGATION      Family History  Problem Relation Age of Onset  . Diabetes Father   . Colon cancer Maternal Grandmother   . Heart disease Paternal Grandmother   . Cancer Paternal Grandmother        esophageal  . Hypertension Paternal Grandmother   . GER disease Paternal Grandmother   . Osteoporosis Paternal Grandmother     Current Outpatient Medications on File Prior to Visit  Medication Sig Dispense Refill  . acetaminophen (TYLENOL) 500 MG tablet Take 500 mg by mouth every 8 (eight) hours as needed.    Marland Kitchen albuterol (PROVENTIL HFA;VENTOLIN HFA) 108 (90 Base) MCG/ACT inhaler Inhale 1-2 puffs into the lungs every 6 (six) hours as needed for wheezing or shortness of breath. 3 Inhaler 0  . citalopram (CELEXA) 20 MG tablet Take 1 tablet (20 mg total) by mouth daily. 30 tablet 11  . hydrocortisone 2.5 % cream Apply rectally twice daily.  (please dispense with rectal cream applicator) 30 g 0  . ibuprofen (ADVIL,MOTRIN) 200 MG tablet Take 800 mg by mouth every 8 (eight) hours as needed.     . Ledipasvir-Sofosbuvir (HARVONI) 90-400 MG TABS Take 1 tablet by mouth daily. [place]  tablet 2  . levothyroxine (SYNTHROID, LEVOTHROID) 25 MCG tablet Take 1 tablet (25 mcg total) by mouth daily before breakfast. 90 tablet 0  . loratadine (CLARITIN) 10 MG tablet Take 1 tablet (10 mg total) by mouth daily. For allergies 30 tablet 11  . metoprolol tartrate (LOPRESSOR) 50 MG tablet TAKE 1 Tablet  BY MOUTH TWICE DAILY 180 tablet 1  . Naproxen Sodium (ALEVE PO) Take 2 tablets by mouth as needed.     Marland Kitchen omeprazole (PRILOSEC) 20 MG capsule Take 1 capsule (20 mg total) by mouth daily. 30 minutes before breakfast 90 capsule 3  . omeprazole (PRILOSEC) 40 MG  capsule TAKE 1 Capsule BY MOUTH ONCE DAILY 90 capsule 1  . Simethicone (GAS RELIEF PO) Take 1 tablet by mouth as needed.     . traZODone (DESYREL) 100 MG tablet Take 1 tablet (100 mg total) by mouth at bedtime as needed for sleep. 30 tablet 0   No current facility-administered medications on file prior to visit.     Allergies  Allergen Reactions  . Clarithromycin Diarrhea and Other (See Comments)    Diarrhea and abd pain  . Propoxyphene Itching    Social History   Substance and Sexual Activity  Alcohol Use Not Currently  . Alcohol/week: 1.0 standard drinks  . Types: 1 Cans of beer per week  . Frequency: Never   Comment: hx binge drinker. none since 12/16/17    Social History   Tobacco Use  Smoking Status Current Every Day Smoker  . Packs/day: 0.50  . Years: 27.00  . Pack years: 13.50  . Types: Cigarettes  Smokeless Tobacco Never Used    Review of Systems  Constitutional: Positive for malaise/fatigue.  HENT: Positive for sinus pain.   Eyes: Positive for blurred vision.  Respiratory: Positive for shortness of breath and wheezing.   Cardiovascular: Negative.   Gastrointestinal: Positive for abdominal pain and heartburn.  Genitourinary: Positive for frequency.  Musculoskeletal: Positive for back pain, joint pain and neck pain.  Skin: Negative.   Neurological: Negative.   Endo/Heme/Allergies: Negative.   Psychiatric/Behavioral: Positive for substance abuse.    Objective   Vitals:   07/19/18 0913  BP: 135/88  Pulse: 61  Resp: 18  Temp: (!) 96.9 F (36.1 C)    Physical Exam  Constitutional: She is oriented to person, place, and time. She appears well-developed and well-nourished. No distress.  HENT:  Head: Normocephalic and atraumatic.  Cardiovascular: Normal rate, regular rhythm and normal heart sounds. Exam reveals no gallop and no friction rub.  No murmur heard. Pulmonary/Chest: Effort normal and breath sounds normal. No stridor. No respiratory distress.  She has no wheezes. She has no rales.  Abdominal: Soft. Bowel sounds are normal. She exhibits no distension and no mass. There is no tenderness. There is no rebound and no guarding. No hernia.  No discrete hernia noted at previous repair site.  She does have some laxity of the muscle wall in the upper abdomen.  Neurological: She is alert and oriented to person, place, and time.  Skin: Skin is warm and dry.  Vitals reviewed.   Assessment  Abdominal swelling.  I reassured the patient that I do not appreciate a discrete hernia. Plan   She is relieved that she does not have a hernia.  I told her should she have any questions in the future, she can return to my office for evaluation.  Follow-up here as needed.

## 2018-07-19 NOTE — Patient Instructions (Signed)

## 2018-07-26 ENCOUNTER — Ambulatory Visit: Payer: Medicaid Other | Admitting: Physician Assistant

## 2018-07-26 ENCOUNTER — Encounter: Payer: Self-pay | Admitting: Physician Assistant

## 2018-07-26 VITALS — BP 150/80 | HR 79 | Temp 97.7°F | Ht 64.0 in | Wt 254.8 lb

## 2018-07-26 DIAGNOSIS — J069 Acute upper respiratory infection, unspecified: Secondary | ICD-10-CM

## 2018-07-26 MED ORDER — BENZONATATE 100 MG PO CAPS: ORAL_CAPSULE | ORAL | 3 refills | Status: DC

## 2018-07-26 NOTE — Progress Notes (Signed)
BP (!) 150/80   Pulse 79   Temp 97.7 F (36.5 C)   Ht 5\' 4"  (1.626 m)   Wt 254 lb 12 oz (115.6 kg)   LMP 07/09/2018   SpO2 96%   BMI 43.73 kg/m    Subjective:    Patient ID: Deborah Barnes, female    DOB: [birth year], 42 y.o.   MRN: [id number]  HPI: Deborah Barnes is a 42 y.o. female presenting on 07/26/2018 for Sore Throat (cough, sneezing, pt temp was 99 yesterday afternoon, chest congestion, some sunny nose, wheezing, sob. pt has taken tylenol and ibu no other otc meds.sx began about 2 days ago) and Stye (R eye. has been there over a month. pt states has gotten smaller)   HPI  Chief Complaint  Patient presents with  . Sore Throat    cough, sneezing, pt temp was 99 yesterday afternoon, chest congestion, some sunny nose, wheezing, sob. pt has taken tylenol and ibu no other otc meds.sx began about 2 days ago  . Stye    R eye. has been there over a month. pt states has gotten smaller    Relevant past medical, surgical, family and social history reviewed and updated as indicated. Interim medical history since our last visit reviewed. Allergies and medications reviewed and updated.    Current Outpatient Medications:  .  acetaminophen (TYLENOL) 500 MG tablet, Take 500 mg by mouth every 8 (eight) hours as needed., Disp: , Rfl:  .  albuterol (PROVENTIL HFA;VENTOLIN HFA) 108 (90 Base) MCG/ACT inhaler, Inhale 1-2 puffs into the lungs every 6 (six) hours as needed for wheezing or shortness of breath., Disp: 3 Inhaler, Rfl: 0 .  citalopram (CELEXA) 20 MG tablet, Take 1 tablet (20 mg total) by mouth daily., Disp: 30 tablet, Rfl: 11 .  hydrocortisone 2.5 % cream, Apply rectally twice daily.  (please dispense with rectal cream applicator), Disp: 30 g, Rfl: 0 .  ibuprofen (ADVIL,MOTRIN) 200 MG tablet, Take 800 mg by mouth every 8 (eight) hours as needed. , Disp: , Rfl:  .  Ledipasvir-Sofosbuvir (HARVONI) 90-400 MG TABS, Take 1 tablet by mouth daily., Disp: 30 tablet, Rfl: 2 .   levothyroxine (SYNTHROID, LEVOTHROID) 25 MCG tablet, Take 1 tablet (25 mcg total) by mouth daily before breakfast., Disp: 90 tablet, Rfl: 0 .  loratadine (CLARITIN) 10 MG tablet, Take 1 tablet (10 mg total) by mouth daily. For allergies, Disp: 30 tablet, Rfl: 11 .  metoprolol tartrate (LOPRESSOR) 50 MG tablet, TAKE 1 Tablet  BY MOUTH TWICE DAILY, Disp: 180 tablet, Rfl: 1 .  Naproxen Sodium (ALEVE PO), Take 2 tablets by mouth as needed. , Disp: , Rfl:  .  omeprazole (PRILOSEC) 20 MG capsule, Take 1 capsule (20 mg total) by mouth daily. 30 minutes before breakfast, Disp: 90 capsule, Rfl: 3 .  Simethicone (GAS RELIEF PO), Take 1 tablet by mouth as needed. , Disp: , Rfl:  .  traZODone (DESYREL) 100 MG tablet, Take 1 tablet (100 mg total) by mouth at bedtime as needed for sleep., Disp: 30 tablet, Rfl: 0 .  omeprazole (PRILOSEC) 40 MG capsule, TAKE 1 Capsule BY MOUTH ONCE DAILY (Patient not taking: Reported on 07/26/2018), Disp: 90 capsule, Rfl: 1  Review of Systems  Constitutional: Positive for chills, diaphoresis, fatigue and fever (temp 99). Negative for appetite change and unexpected weight change.  HENT: Positive for congestion, dental problem, ear pain, facial swelling, hearing loss, sneezing, sore throat, trouble swallowing and voice change. Negative for drooling  and mouth sores.   Eyes: Positive for pain, discharge, redness, itching and visual disturbance.  Respiratory: Positive for cough, shortness of breath and wheezing. Negative for choking.   Cardiovascular: Negative for chest pain, palpitations and leg swelling.  Gastrointestinal: Negative for abdominal pain, blood in stool, constipation, diarrhea and vomiting.  Endocrine: Positive for heat intolerance. Negative for cold intolerance and polydipsia.  Genitourinary: Negative for decreased urine volume, dysuria and hematuria.  Musculoskeletal: Positive for arthralgias, back pain and gait problem.  Skin: Negative for rash.   Allergic/Immunologic: Positive for environmental allergies.  Neurological: Positive for headaches. Negative for seizures, syncope and light-headedness.  Hematological: Positive for adenopathy.  Psychiatric/Behavioral: Positive for dysphoric mood. Negative for agitation and suicidal ideas. The patient is nervous/anxious.     Per HPI unless specifically indicated above     Objective:    BP (!) 150/80   Pulse 79   Temp 97.7 F (36.5 C)   Ht 5\' 4"  (1.626 m)   Wt 254 lb 12 oz (115.6 kg)   LMP 07/09/2018   SpO2 96%   BMI 43.73 kg/m   Wt Readings from Last 3 Encounters:  07/26/18 254 lb 12 oz (115.6 kg)  07/19/18 248 lb 9.6 oz (112.8 kg)  07/17/18 249 lb (112.9 kg)    Physical Exam  Constitutional: She is oriented to person, place, and time. She appears well-developed and well-nourished.  HENT:  Head: Normocephalic and atraumatic.  Right Ear: Hearing, tympanic membrane, external ear and ear canal normal.  Left Ear: Hearing, tympanic membrane, external ear and ear canal normal.  Nose: Nose normal.  Mouth/Throat: Uvula is midline and oropharynx is clear and moist. No oropharyngeal exudate.  Eyes: Conjunctivae and lids are normal. Right eye exhibits no hordeolum. Left eye exhibits no hordeolum.  Neck: Neck supple.  Cardiovascular: Normal rate and regular rhythm.  Pulmonary/Chest: Effort normal and breath sounds normal. She has no wheezes.  Lymphadenopathy:    She has no cervical adenopathy.  Neurological: She is alert and oriented to person, place, and time.  Skin: Skin is warm and dry.  Psychiatric: She has a normal mood and affect. Her behavior is normal.  Vitals reviewed.       Assessment & Plan:    Encounter Diagnosis  Name Primary?  Marland Kitchen Upper respiratory tract infection, unspecified type Yes   -Rest. Fluids.  Inhaler as needed. Avoid smoking -Warm salt water gargles -rx Tessalon -Tylenol or IBU prn -follow up as scheduled.  RTO sooner prn worsening or new  symptoms

## 2018-07-26 NOTE — Patient Instructions (Signed)
Adenovirus Infection, Adult Adenoviruses are common viruses that cause many different types of infections. The viruses usually affect the lungs, but they can also affect other parts of the body, including the eyes, stomach, bowels, bladder, and brain. The most common type of adenovirus infection is the common cold. Usually, adenovirus infections are not severe unless you have another health problem that makes it hard for your body to fight off infection. What are the causes? You can get this condition if you:  Touch a surface or object that has an adenovirus on it and then touch your mouth, nose, or eyes with unwashed hands.  Come into close physical contact with an infected person, such as by hugging or shaking hands.  Breathe in droplets that fly through the air when an infected person talks, coughs, or sneezes.  Have contact with infected stool.  Swim in a pool that does not have enough chlorine.  Adenoviruses can live outside the body for many weeks. They spread easily from person to person (are contagious). What increases the risk? This condition is more likely to develop in:  People who spend a lot of time in places where there are many people, such as schools, summer camps, daycare centers, community centers, and [employer] recruit training centers.  Elderly adults.  People with a weak body defense system (immune system).  People with a lung disease.  People with a heart condition.  What are the signs or symptoms? Adenovirus infections usually cause flu-like symptoms. Once the virus gets into the body, symptoms of this condition can take up to 14 days to develop. Symptoms may include:  Headache.  Stiff neck.  Sleepiness or fatigue.  Confusion or disorientation.  Fever.  Sore throat.  Cough.  Trouble breathing.  Runny nose or congestion.  Pink eye (conjunctivitis).  Bleeding into the covering of the eye.  Stomachache or diarrhea.  Nausea or  vomiting.  Blood in the urine or pain while urinating.  Ear pain or fullness.  How is this diagnosed? This condition may be diagnosed based on your symptoms and a physical exam. Your health care provider may order tests to make sure your symptoms are not caused by another type of problem. Tests can include:  Blood tests.  Urine tests.  Stool tests.  Chest X-ray.  Tissue or throat culture.  How is this treated? This condition goes away on its own with time. Treatment for this condition involves managing symptoms until the condition goes away. Your health care provider may recommend:  Rest.  Drinking more fluids.  Taking over-the-counter medicine to help relieve a sore throat, fever, or headache.  Follow these instructions at home:  Rest at home until your symptoms go away.  Drink enough fluid to keep your urine clear or pale yellow.  Take over-the-counter and prescription medicines only as told by your health care provider.  Keep all follow-up visits as told by your health care provider. This is important. How is this prevented? Adenoviruses are resistant to many cleaning products and can remain on surfaces for long periods of time. To help prevent infection:  Wash your hands often with soap and water.  Cover your nose or mouth when you sneeze or cough.  Do not touch your eyes, nose, or mouth with unwashed hands.  Clean commonly used objects often.  Do not swim in a pool that is not properly chlorinated.  Avoid close contact with people who are sick.  Do not go to school or work when you are  sick.  Contact a health care provider if:  Your symptoms do not improve after 10 days.  Your symptoms get worse.  You cannot eat or drink without vomiting. Get help right away if:  You have trouble breathing or you are breathing rapidly.  Your skin, lips, or fingernails look blue (cyanosis).  You have a rapid heart rate.  You become confused.  You lose  consciousness. This information is not intended to replace advice given to you by your health care provider. Make sure you discuss any questions you have with your health care provider. Document Released: 11/26/2002 Document Revised: 05/02/2016 Document Reviewed: 05/02/2016 Elsevier Interactive Patient Education  [employer].

## 2018-08-02 ENCOUNTER — Ambulatory Visit: Payer: Medicaid Other | Admitting: Physician Assistant

## 2018-08-02 ENCOUNTER — Encounter: Payer: Self-pay | Admitting: Physician Assistant

## 2018-08-02 ENCOUNTER — Telehealth: Payer: Self-pay | Admitting: Internal Medicine

## 2018-08-02 ENCOUNTER — Other Ambulatory Visit: Payer: Self-pay | Admitting: Physician Assistant

## 2018-08-02 VITALS — BP 137/86 | HR 69 | Temp 97.7°F | Ht 64.0 in | Wt 251.0 lb

## 2018-08-02 DIAGNOSIS — J449 Chronic obstructive pulmonary disease, unspecified: Secondary | ICD-10-CM

## 2018-08-02 DIAGNOSIS — K0889 Other specified disorders of teeth and supporting structures: Secondary | ICD-10-CM

## 2018-08-02 MED ORDER — AMOXICILLIN 500 MG PO CAPS: 500.0000 mg | ORAL_CAPSULE | Freq: Three times a day (TID) | ORAL | 0 refills | Status: DC

## 2018-08-02 MED ORDER — LORATADINE 10 MG PO TABS: 10.0000 mg | ORAL_TABLET | Freq: Every day | ORAL | 1 refills | Status: DC

## 2018-08-02 NOTE — Telephone Encounter (Signed)
Spoke with Pt. She was prescribed Amoxicillin for an infected tooth and didn't want Amoxicillin to interfere with her Harvoni. Pt wants to start medication tomorrow. Please advise in the absence of AB. Will call pt back at this number tomorrow [phone].

## 2018-08-02 NOTE — Telephone Encounter (Signed)
OK to take Amoxicillin. Will not interfere with Harvoni.

## 2018-08-02 NOTE — Telephone Encounter (Signed)
Pt called with questions about medications and if she needed to keep taking Harvoni. I transferred call to AM VM.

## 2018-08-02 NOTE — Progress Notes (Signed)
BP 137/86 (BP Location: Left Arm, Patient Position: Sitting, Cuff Size: Normal)   Pulse 69   Temp 97.7 F (36.5 C)   Ht 5\' 4"  (1.626 m)   Wt 251 lb (113.9 kg)   LMP 07/09/2018   SpO2 100%   BMI 43.08 kg/m    Subjective:    Patient ID: Deborah Barnes, female    DOB: June 13, 1976, 42 y.o.   MRN: [id number]  HPI: Deborah Barnes is a 42 y.o. female presenting on 08/02/2018 for Dental Pain   HPI   Chief Complaint  Patient presents with  . Dental Pain   States dental pain.  She has appointment to have the tooth removed in December.   Relevant past medical, surgical, family and social history reviewed and updated as indicated. Interim medical history since our last visit reviewed. Allergies and medications reviewed and updated.   Current Outpatient Medications:  .  acetaminophen (TYLENOL) 500 MG tablet, Take 500 mg by mouth every 8 (eight) hours as needed., Disp: , Rfl:  .  albuterol (PROVENTIL HFA;VENTOLIN HFA) 108 (90 Base) MCG/ACT inhaler, Inhale 1-2 puffs into the lungs every 6 (six) hours as needed for wheezing or shortness of breath., Disp: 3 Inhaler, Rfl: 0 .  benzonatate (TESSALON PERLES) 100 MG capsule, 1-2 po q 8 hour prn cough, Disp: 20 capsule, Rfl: 3 .  citalopram (CELEXA) 20 MG tablet, Take 1 tablet (20 mg total) by mouth daily., Disp: 30 tablet, Rfl: 11 .  hydrocortisone 2.5 % cream, Apply rectally twice daily.  (please dispense with rectal cream applicator), Disp: 30 g, Rfl: 0 .  ibuprofen (ADVIL,MOTRIN) 200 MG tablet, Take 800 mg by mouth every 8 (eight) hours as needed. , Disp: , Rfl:  .  Ledipasvir-Sofosbuvir (HARVONI) 90-400 MG TABS, Take 1 tablet by mouth daily., Disp: 30 tablet, Rfl: 2 .  levothyroxine (SYNTHROID, LEVOTHROID) 25 MCG tablet, Take 1 tablet (25 mcg total) by mouth daily before breakfast., Disp: 90 tablet, Rfl: 0 .  metoprolol tartrate (LOPRESSOR) 50 MG tablet, TAKE 1 Tablet  BY MOUTH TWICE DAILY, Disp: 180 tablet, Rfl: 1 .  Naproxen Sodium  (ALEVE PO), Take 2 tablets by mouth as needed. , Disp: , Rfl:  .  omeprazole (PRILOSEC) 20 MG capsule, Take 1 capsule (20 mg total) by mouth daily. 30 minutes before breakfast, Disp: 90 capsule, Rfl: 3 .  Simethicone (GAS RELIEF PO), Take 1 tablet by mouth as needed. , Disp: , Rfl:  .  traZODone (DESYREL) 100 MG tablet, Take 1 tablet (100 mg total) by mouth at bedtime as needed for sleep., Disp: 30 tablet, Rfl: 0 .  loratadine (CLARITIN) 10 MG tablet, Take 1 tablet (10 mg total) by mouth daily. For allergies (Patient not taking: Reported on 08/02/2018), Disp: 30 tablet, Rfl: 11 .  omeprazole (PRILOSEC) 40 MG capsule, TAKE 1 Capsule BY MOUTH ONCE DAILY (Patient not taking: Reported on 07/26/2018), Disp: 90 capsule, Rfl: 1   Review of Systems  Constitutional: Positive for appetite change, diaphoresis, fatigue and fever. Negative for chills and unexpected weight change.  HENT: Positive for congestion, dental problem, facial swelling, hearing loss, mouth sores, sneezing, sore throat, trouble swallowing and voice change. Negative for drooling and ear pain.   Eyes: Positive for discharge and itching. Negative for pain, redness and visual disturbance.  Respiratory: Positive for cough, shortness of breath and wheezing. Negative for choking.   Cardiovascular: Negative for chest pain, palpitations and leg swelling.  Gastrointestinal: Positive for diarrhea. Negative for abdominal  pain, blood in stool, constipation and vomiting.  Endocrine: Positive for heat intolerance and polydipsia. Negative for cold intolerance.  Genitourinary: Negative for decreased urine volume, dysuria and hematuria.  Musculoskeletal: Positive for arthralgias, back pain and gait problem.  Skin: Positive for rash.  Allergic/Immunologic: Positive for environmental allergies.  Neurological: Positive for light-headedness and headaches. Negative for seizures and syncope.  Hematological: Positive for adenopathy.  Psychiatric/Behavioral:  Positive for dysphoric mood. Negative for agitation and suicidal ideas. The patient is nervous/anxious.     Per HPI unless specifically indicated above     Objective:    BP 137/86 (BP Location: Left Arm, Patient Position: Sitting, Cuff Size: Normal)   Pulse 69   Temp 97.7 F (36.5 C)   Ht 5\' 4"  (1.626 m)   Wt 251 lb (113.9 kg)   LMP 07/09/2018   SpO2 100%   BMI 43.08 kg/m   Wt Readings from Last 3 Encounters:  08/02/18 251 lb (113.9 kg)  07/26/18 254 lb 12 oz (115.6 kg)  07/19/18 248 lb 9.6 oz (112.8 kg)    Physical Exam  Constitutional: She is oriented to person, place, and time. She appears well-developed and well-nourished.  HENT:  Head: Normocephalic and atraumatic.  Mouth/Throat: Uvula is midline and oropharynx is clear and moist. No trismus in the jaw. Abnormal dentition. Dental caries present. No dental abscesses or uvula swelling.  Pulmonary/Chest: Effort normal. No respiratory distress.  Neurological: She is alert and oriented to person, place, and time.  Skin: Skin is warm and dry.  Psychiatric: She has a normal mood and affect. Her behavior is normal.  Nursing note and vitals reviewed.       Assessment & Plan:    Encounter Diagnosis  Name Primary?  Paschal Dopp Yes    -rx amoxil -pt has appointment with dentist in December -pt to Follow up as scheduled

## 2018-08-03 NOTE — Telephone Encounter (Signed)
Lmom, pt is aware that it's ok to take Amoxicillin.

## 2018-08-06 ENCOUNTER — Telehealth: Payer: Self-pay | Admitting: Internal Medicine

## 2018-08-06 MED ORDER — OMEPRAZOLE 20 MG PO CPDR: 20.0000 mg | DELAYED_RELEASE_CAPSULE | Freq: Every day | ORAL | 3 refills | Status: DC

## 2018-08-06 NOTE — Telephone Encounter (Signed)
Pt has questions about her prescription of Omeprazole 40mg  and/or 20mg  and being on Harvoni. [facility] is needing another prescription per patient. Please call her at [phone]

## 2018-08-06 NOTE — Telephone Encounter (Signed)
Spoke with pt and the pharmacy sent her 40 mg of omeprazole instead of 20 mg. She would like to know if you can send a 30day supply of Omeprazole 20 mg so she can take the 20 mg while on the Harvoni? Please advise.

## 2018-08-06 NOTE — Telephone Encounter (Signed)
Done. I sent it to [facility].

## 2018-08-07 NOTE — Telephone Encounter (Signed)
Noted. Pt is aware medication would be sent to pharmacy.

## 2018-08-22 ENCOUNTER — Encounter: Payer: Self-pay | Admitting: Nutrition

## 2018-08-22 ENCOUNTER — Encounter: Payer: Medicaid Other | Attending: Physician Assistant | Admitting: Nutrition

## 2018-08-22 VITALS — Ht 64.0 in | Wt 253.0 lb

## 2018-08-22 DIAGNOSIS — E669 Obesity, unspecified: Secondary | ICD-10-CM

## 2018-08-22 NOTE — Patient Instructions (Signed)
Goals 1. Follow MY Plate  2. Eat three meals 3. Look into [employer]. Lose 1 lbs per week Increase fresh fruits and vegetables. Keep food journal

## 2018-08-22 NOTE — Progress Notes (Signed)
Medical Nutrition Therapy:  Appt start time: 1330 end time:  1430.   Assessment:  Primary concerns today: Obesity. Lives in a half way house.  Goes to AA and NA. 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 Eats 2 meals per day.  In the process of getting her GED. Wants to lose weight. Physical activity limited due to hip problems. Can't get hip replacement yet. Wants to try to go to [facility] for water aerobics for therapy. Limited financially. Willing to work on eating better and start exercising if she can into water aerobics.  Wt Readings from Last 3 Encounters:  08/22/18 253 lb (114.8 kg)  08/02/18 251 lb (113.9 kg)  07/26/18 254 lb 12 oz (115.6 kg)   Ht Readings from Last 3 Encounters:  08/22/18 5\' 4"  (1.626 m)  08/02/18 5\' 4"  (1.626 m)  07/26/18 5\' 4"  (1.626 m)   Body mass index is 43.43 kg/m.    CMP Latest Ref Rng & Units 07/02/2018 03/26/2018 01/26/2018  Glucose 70 - 99 mg/dL 102(H) 101(H) 79  BUN 6 - 20 mg/dL 12 8 9   Creatinine 0.44 - 1.00 mg/dL 0.78 0.72 0.57  Sodium 135 - 145 mmol/L 134(L) 138 139  Potassium 3.5 - 5.1 mmol/L 3.9 4.1 4.2  Chloride 98 - 111 mmol/L 106 107 104  CO2 22 - 32 mmol/L 23 25 28   Calcium 8.9 - 10.3 mg/dL 8.9 8.7(L) 9.0  Total Protein 6.5 - 8.1 g/dL 7.3 6.8 7.4  Total Bilirubin 0.3 - 1.2 mg/dL 0.5 0.4 0.7  Alkaline Phos 38 - 126 U/L 61 70 86  AST 15 - 41 U/L 18 67(H) 98(H)  ALT 0 - 44 U/L 13 104(H) 237(H)   Lipid Panel     Component Value Date/Time   CHOL 160 09/05/2017 0621   TRIG 137 09/05/2017 0621   HDL 56 09/05/2017 0621   CHOLHDL 2.9 09/05/2017 0621   VLDL 27 09/05/2017 0621   LDLCALC 77 09/05/2017 0621    Preferred Learning Style:    No preference indicated   Learning Readiness:  Ready  Change in progress   MEDICATIONS:    DIETARY INTAKE  24-hr recall:  B ( AM): skip or banana or pack of nabs Snk ( AM):   L ( PM): leftovers from dinner,  water Snk ( PM):  D ( PM): chicken vegetables souip,  Crackers,  Cheese raveloi, water Snk (  PM): 2 crackers pb nabs.  Beverages: water  Usual physical activity: ADL  Hip problems.  Estimated energy needs: 1200 calories 135 g carbohydrates 90 g protein 33 g fat  Progress Towards Goal(s):  In progress.   Nutritional Diagnosis:  NB-1.1 Food and nutrition-related knowledge deficit As related to Overeating.  As evidenced by BMI > 40..    Intervention:  Nutrition and weight loss  education provided on My Plate, CHO counting, meal planning, portion sizes, timing of meals, avoiding snacks between meals taking medications as prescribed, benefits of exercising 30 minutes per day and prevention of DM. Heart Healthy Eating . Goals 1. Follow MY Plate  2. Eat three meals 3. Look into [employer]. Lose 1 lbs per week Increase fresh fruits and vegetables. Keep food journal   Teaching Method Utilized:  Visual Auditory Hands on  Handouts given during visit include:  The Plate Method   Meal Plan Card   Barriers to learning/adherence to lifestyle change: none  Demonstrated degree of understanding via:  Teach Back   Monitoring/Evaluation:  Dietary intake, exercise, meal planing, and body weight  in 1 month(s).

## 2018-08-28 ENCOUNTER — Encounter: Payer: Self-pay | Admitting: Physician Assistant

## 2018-08-28 ENCOUNTER — Ambulatory Visit: Payer: Medicaid Other | Admitting: Physician Assistant

## 2018-08-28 VITALS — BP 120/86 | HR 66 | Temp 97.7°F | Ht 64.0 in | Wt 249.0 lb

## 2018-08-28 DIAGNOSIS — F17211 Nicotine dependence, cigarettes, in remission: Secondary | ICD-10-CM

## 2018-08-28 DIAGNOSIS — J069 Acute upper respiratory infection, unspecified: Secondary | ICD-10-CM

## 2018-08-28 DIAGNOSIS — J441 Chronic obstructive pulmonary disease with (acute) exacerbation: Secondary | ICD-10-CM

## 2018-08-28 MED ORDER — ALBUTEROL SULFATE (2.5 MG/3ML) 0.083% IN NEBU
2.5000 mg | INHALATION_SOLUTION | Freq: Once | RESPIRATORY_TRACT | Status: AC
  Administered 2018-08-28: 2.5 mg via RESPIRATORY_TRACT

## 2018-08-28 MED ORDER — METHYLPREDNISOLONE ACETATE 80 MG/ML IJ SUSP
80.0000 mg | Freq: Once | INTRAMUSCULAR | Status: AC
  Administered 2018-08-28: 80 mg via INTRAMUSCULAR

## 2018-08-28 NOTE — Progress Notes (Signed)
BP 120/86 (BP Location: Left Arm, Patient Position: Sitting, Cuff Size: Normal)   Pulse 66   Temp 97.7 F (36.5 C)   Ht 5\' 4"  (1.626 m)   Wt 249 lb (112.9 kg)   SpO2 98%   BMI 42.74 kg/m    Subjective:    Patient ID: Deborah Barnes, female    DOB: 1976/01/19, 42 y.o.   MRN: [id number]  HPI: Deborah Barnes is a 42 y.o. female presenting on 08/28/2018 for Cough (sx began on Friday 08-24-18. pt c/o cough, wheezing, runny nose, congestion, brown mucous, sore throat. pt has taking tussin for cough that helps a little)   HPI   Chief Complaint  Patient presents with  . Cough    sx began on Friday 08-24-18. pt c/o cough, wheezing, runny nose, congestion, brown mucous, sore throat. pt has taking tussin for cough that helps a little    Tmax 99.2  Pt is living in group home/[facility] house.   Relevant past medical, surgical, family and social history reviewed and updated as indicated. Interim medical history since our last visit reviewed. Allergies and medications reviewed and updated.   Current Outpatient Medications:  .  acetaminophen (TYLENOL) 500 MG tablet, Take 500 mg by mouth every 8 (eight) hours as needed., Disp: , Rfl:  .  citalopram (CELEXA) 20 MG tablet, Take 1 tablet (20 mg total) by mouth daily., Disp: 30 tablet, Rfl: 11 .  ibuprofen (ADVIL,MOTRIN) 200 MG tablet, Take 800 mg by mouth every 8 (eight) hours as needed. , Disp: , Rfl:  .  levothyroxine (SYNTHROID, LEVOTHROID) 25 MCG tablet, Take 1 tablet (25 mcg total) by mouth daily before breakfast., Disp: 90 tablet, Rfl: 0 .  loratadine (CLARITIN) 10 MG tablet, Take 1 tablet (10 mg total) by mouth daily. For allergies, Disp: 90 tablet, Rfl: 1 .  metoprolol tartrate (LOPRESSOR) 50 MG tablet, TAKE 1 Tablet  BY MOUTH TWICE DAILY, Disp: 180 tablet, Rfl: 1 .  Naproxen Sodium (ALEVE PO), Take 2 tablets by mouth as needed. , Disp: , Rfl:  .  omeprazole (PRILOSEC) 20 MG capsule, Take 1 capsule (20 mg total) by mouth daily. 30  minutes before breakfast, Disp: 90 capsule, Rfl: 3 .  PROVENTIL HFA 108 (90 Base) MCG/ACT inhaler, INHALE 1 TO 2 PUFFS BY MOUTH EVERY 6 HOURS AS NEEDED FOR COUGHING, WHEEZING, OR SHORTNESS OF BREATH, Disp: 20.1 g, Rfl: 1 .  Simethicone (GAS RELIEF PO), Take 1 tablet by mouth as needed. , Disp: , Rfl:  .  traZODone (DESYREL) 100 MG tablet, Take 1 tablet (100 mg total) by mouth at bedtime as needed for sleep., Disp: 30 tablet, Rfl: 0 .  hydrocortisone 2.5 % cream, Apply rectally twice daily.  (please dispense with rectal cream applicator) (Patient not taking: Reported on 08/28/2018), Disp: 30 g, Rfl: 0     Review of Systems  Constitutional: Positive for appetite change, chills, diaphoresis, fatigue and fever. Negative for unexpected weight change.  HENT: Positive for congestion, dental problem, sneezing, sore throat, trouble swallowing and voice change. Negative for drooling, ear pain, facial swelling, hearing loss and mouth sores.   Eyes: Positive for discharge. Negative for pain, redness, itching and visual disturbance.  Respiratory: Positive for cough, shortness of breath and wheezing. Negative for choking.   Cardiovascular: Negative for chest pain, palpitations and leg swelling.  Gastrointestinal: Positive for constipation. Negative for abdominal pain, blood in stool, diarrhea and vomiting.  Endocrine: Positive for cold intolerance and heat intolerance. Negative for  polydipsia.  Genitourinary: Negative for decreased urine volume, dysuria and hematuria.  Musculoskeletal: Positive for arthralgias, back pain and gait problem.  Skin: Negative for rash.  Allergic/Immunologic: Positive for environmental allergies.  Neurological: Positive for headaches. Negative for seizures, syncope and light-headedness.  Hematological: Positive for adenopathy.  Psychiatric/Behavioral: Positive for agitation and dysphoric mood. Negative for suicidal ideas. The patient is nervous/anxious.     Per HPI unless  specifically indicated above     Objective:    BP 120/86 (BP Location: Left Arm, Patient Position: Sitting, Cuff Size: Normal)   Pulse 66   Temp 97.7 F (36.5 C)   Ht 5\' 4"  (1.626 m)   Wt 249 lb (112.9 kg)   SpO2 98%   BMI 42.74 kg/m   Wt Readings from Last 3 Encounters:  08/28/18 249 lb (112.9 kg)  08/22/18 253 lb (114.8 kg)  08/02/18 251 lb (113.9 kg)    Physical Exam  Constitutional: She is oriented to person, place, and time. She appears well-developed and well-nourished.  HENT:  Head: Normocephalic and atraumatic.  Right Ear: Hearing, tympanic membrane, external ear and ear canal normal.  Left Ear: Hearing, tympanic membrane, external ear and ear canal normal.  Nose: Nose normal.  Mouth/Throat: Uvula is midline and oropharynx is clear and moist. No oropharyngeal exudate.  Neck: Neck supple.  Cardiovascular: Normal rate and regular rhythm.  Pulmonary/Chest: Effort normal. No respiratory distress. She has wheezes. She has no rales.  Lymphadenopathy:    She has no cervical adenopathy.  Neurological: She is alert and oriented to person, place, and time.  Skin: Skin is warm and dry.  Psychiatric: She has a normal mood and affect. Her behavior is normal.  Vitals reviewed.       Assessment & Plan:   Encounter Diagnoses  Name Primary?  Marland Kitchen Upper respiratory tract infection, unspecified type Yes  . COPD exacerbation ([place])   . Cigarette nicotine dependence in remission     -depomedrol in office (pt with limited funds/unable to purchase prednisone) -nebulizer in office helped pt. She is counseled to use MDI as needed -rx tessalon to use as needed -pt encouraged to continue to avoid smoking -pt to follow up as scheduled.  RTO sooner prn

## 2018-09-03 ENCOUNTER — Encounter: Payer: Self-pay | Admitting: Gastroenterology

## 2018-09-03 ENCOUNTER — Encounter: Payer: Self-pay | Admitting: *Deleted

## 2018-09-03 ENCOUNTER — Telehealth: Payer: Self-pay | Admitting: *Deleted

## 2018-09-03 ENCOUNTER — Ambulatory Visit (INDEPENDENT_AMBULATORY_CARE_PROVIDER_SITE_OTHER): Payer: Self-pay | Admitting: Gastroenterology

## 2018-09-03 VITALS — BP 112/74 | HR 71 | Temp 98.1°F | Ht 64.0 in | Wt 246.2 lb

## 2018-09-03 DIAGNOSIS — B182 Chronic viral hepatitis C: Secondary | ICD-10-CM

## 2018-09-03 MED ORDER — OMEPRAZOLE 40 MG PO CPDR: 40.0000 mg | DELAYED_RELEASE_CAPSULE | Freq: Every day | ORAL | 3 refills | Status: DC

## 2018-09-03 NOTE — Patient Instructions (Signed)
We will order an ultrasound in Jan 2020.  Please have labs done when you are able.  I have sent in omeprazole 40 milligrams to take daily. Let me know if you have any issues!  Have a Merry Christmas, and I will see you in 3 months!  I enjoyed seeing you again today! As you know, I value our relationship and want to provide genuine, compassionate, and quality care. I welcome your feedback. If you receive a survey regarding your visit,  I greatly appreciate you taking time to fill this out. See you next time!  Annitta Needs, PhD, ANP-BC [facility]

## 2018-09-03 NOTE — Telephone Encounter (Signed)
Checked [employer]'s website to see if PA is required for RUQ U/S. Received message "NOTE: This [facility] member does not require prior authorization for OUTPATIENT Radiology through [place] or [place] [facility] at this time".

## 2018-09-03 NOTE — Progress Notes (Signed)
Referring Provider: Soyla Dryer, PA-C Primary Care Physician:  Soyla Dryer, PA-C  Primary GI: Dr. Gala Romney   Chief Complaint  Patient presents with  . Hepatitis C    finished Harvoni    HPI:   Deborah Barnes is a 42 y.o. female presenting today with a history of chronic Hep C genotype 1a, Metavir F3/F4. No evidence of chronic Hep B. First dose of Harvoni on 9/16. She has completed Harvoni. Will need labs now and then again at 3 months post-treatment. RUQ ultrasound due in Jan 2020.   Feels less fatigued. No abdominal pain. Good appetite. Has some indigestion issues on Prilosec 20 mg. Requesting to resume 40 mg dosing as this worked better. Still at [place]. NO GI concerns currently.   Past Medical History:  Diagnosis Date  . Asthma   . Back pain   . Bulging disc   . COPD (chronic obstructive pulmonary disease) ([place])   . Degenerative disc disease   . Depression   . Depression with anxiety   . GERD (gastroesophageal reflux disease)   . Hepatitis C 08/2017  . Hypercholesterolemia   . Hypertension   . PTSD (post-traumatic stress disorder)   . Sciatica   . Shortness of breath   . Thyroid disease     Past Surgical History:  Procedure Laterality Date  . BIOPSY N/A 09/25/2014   Procedure: BIOPSY;  Surgeon: Daneil Dolin, MD;  Location: [facility];  Service: Endoscopy;  Laterality: N/A;  Gastric, Ascending Colon, Descending/Sigmoid Colon   . CARPAL TUNNEL RELEASE Bilateral   . CHOLECYSTECTOMY N/A 04/26/2013   Procedure: LAPAROSCOPIC CHOLECYSTECTOMY;  Surgeon: Jamesetta So, MD;  Location: [facility];  Service: General;  Laterality: N/A;  . COLONOSCOPY WITH PROPOFOL N/A 09/25/2014   RMR: Pancolonic diverticulosis. multiple tubular adenomas, segmental biopsies negative. surveillance in 5 years  . ESOPHAGOGASTRODUODENOSCOPY (EGD) WITH PROPOFOL N/A 09/25/2014   RMR: Normal esophagus. Abnormal gastric mucosa status post biopsy (negative H.pylori). Hiatal hernia.   Fatima Blank  HERNIA REPAIR N/A 10/04/2013   Procedure: HERNIA REPAIR INCISIONAL WITH MESH;  Surgeon: Jamesetta So, MD;  Location: [facility];  Service: General;  Laterality: N/A;  . INSERTION OF MESH N/A 10/04/2013   Procedure: INSERTION OF MESH;  Surgeon: Jamesetta So, MD;  Location: [facility];  Service: General;  Laterality: N/A;  . POLYPECTOMY N/A 09/25/2014   Procedure: POLYPECTOMY;  Surgeon: Daneil Dolin, MD;  Location: [facility];  Service: Endoscopy;  Laterality: N/A;  Cecal, Ascending Colon   . SHOULDER SURGERY    . TUBAL LIGATION      Current Outpatient Medications  Medication Sig Dispense Refill  . acetaminophen (TYLENOL) 500 MG tablet Take 500 mg by mouth every 8 (eight) hours as needed.    . citalopram (CELEXA) 20 MG tablet Take 1 tablet (20 mg total) by mouth daily. 30 tablet 11  . ibuprofen (ADVIL,MOTRIN) 200 MG tablet Take 800 mg by mouth every 8 (eight) hours as needed.     Marland Kitchen levothyroxine (SYNTHROID, LEVOTHROID) 25 MCG tablet Take 1 tablet (25 mcg total) by mouth daily before breakfast. 90 tablet 0  . loratadine (CLARITIN) 10 MG tablet Take 1 tablet (10 mg total) by mouth daily. For allergies 90 tablet 1  . metoprolol tartrate (LOPRESSOR) 50 MG tablet TAKE 1 Tablet  BY MOUTH TWICE DAILY 180 tablet 1  . Naproxen Sodium (ALEVE PO) Take 2 tablets by mouth as needed.     Marland Kitchen PROVENTIL HFA 108 (90  Base) MCG/ACT inhaler INHALE 1 TO 2 PUFFS BY MOUTH EVERY 6 HOURS AS NEEDED FOR COUGHING, WHEEZING, OR SHORTNESS OF BREATH 20.1 g 1  . Simethicone (GAS RELIEF PO) Take 1 tablet by mouth as needed.     . traZODone (DESYREL) 100 MG tablet Take 1 tablet (100 mg total) by mouth at bedtime as needed for sleep. 30 tablet 0  . omeprazole (PRILOSEC) 40 MG capsule Take 1 capsule (40 mg total) by mouth daily. 90 capsule 3   No current facility-administered medications for this visit.     Allergies as of 09/03/2018 - Review Complete 09/03/2018  Allergen Reaction Noted  . Clarithromycin Diarrhea and Other (See  Comments) 02/22/2012  . Propoxyphene Itching 01/25/2018    Family History  Problem Relation Age of Onset  . Diabetes Father   . Colon cancer Maternal Grandmother   . Heart disease Paternal Grandmother   . Cancer Paternal Grandmother        esophageal  . Hypertension Paternal Grandmother   . GER disease Paternal Grandmother   . Osteoporosis Paternal Grandmother     Social History   Socioeconomic History  . Marital status: Divorced    Spouse name: Not on file  . Number of children: Not on file  . Years of education: Not on file  . Highest education level: Not on file  Occupational History  . Not on file  Social Needs  . Financial resource strain: Not on file  . Food insecurity:    Worry: Not on file    Inability: Not on file  . Transportation needs:    Medical: Not on file    Non-medical: Not on file  Tobacco Use  . Smoking status: Former Smoker    Packs/day: 0.50    Years: 27.00    Pack years: 13.50    Last attempt to quit: 08/24/2018    Years since quitting: 0.0  . Smokeless tobacco: Never Used  Substance and Sexual Activity  . Alcohol use: Not Currently    Alcohol/week: 1.0 standard drinks    Types: 1 Cans of beer per week    Frequency: Never    Comment: hx binge drinker. none since 12/16/17  . Drug use: Not Currently    Types: Marijuana, Cocaine, Methamphetamines, Heroin    Comment: no cocaine, heroin, meth since 11-27-2017. last marijuana use 01-21-18  . Sexual activity: Yes    Birth control/protection: Surgical  Lifestyle  . Physical activity:    Days per week: Not on file    Minutes per session: Not on file  . Stress: Not on file  Relationships  . Social connections:    Talks on phone: Not on file    Gets together: Not on file    Attends religious service: Not on file    Active member of club or organization: Not on file    Attends meetings of clubs or organizations: Not on file    Relationship status: Not on file  Other Topics Concern  . Not on  file  Social History Narrative  . Not on file    Review of Systems: Gen: Denies fever, chills, anorexia. Denies fatigue, weakness, weight loss.  CV: Denies chest pain, palpitations, syncope, peripheral edema, and claudication. Resp: Denies dyspnea at rest, cough, wheezing, coughing up blood, and pleurisy. GI: see HPI  Derm: Denies rash, itching, dry skin Psych: Denies depression, anxiety, memory loss, confusion. No homicidal or suicidal ideation.  Heme: Denies bruising, bleeding, and enlarged lymph nodes.  Physical  Exam: BP 112/74   Pulse 71   Temp 98.1 F (36.7 C) (Oral)   Ht 5\' 4"  (1.626 m)   Wt 246 lb 3.2 oz (111.7 kg)   LMP 08/04/2018 (Approximate)   BMI 42.26 kg/m  General:   Alert and oriented. No distress noted. Pleasant and cooperative.  Head:  Normocephalic and atraumatic. Eyes:  Conjuctiva clear without scleral icterus. Mouth:  Oral mucosa pink and moist.  Abdomen:  +BS, soft, non-tender and non-distended. No rebound or guarding. No HSM or masses noted. Extremities:  Without edema. Neurologic:  Alert and  oriented x4 Psych:  Alert and cooperative. Normal mood and affect.

## 2018-09-09 NOTE — Assessment & Plan Note (Signed)
Pleasant 42 year old female with history of Hep C genotype 1a, Metavir F3/F4, completing course of Harvoni. Will check labs today and again at 3 months post-treatment. RUQ [place] due in Jan 2020. Return in 3 months. Prilosec 40 mg sent to pharmacy, as she has completed Harvoni treatment and would like to resume prior dosing due to inadequate GERD control on 20 mg.

## 2018-09-10 NOTE — Progress Notes (Signed)
CC'D TO PCP °

## 2018-09-20 ENCOUNTER — Ambulatory Visit: Payer: Medicaid Other | Admitting: "Endocrinology

## 2018-09-25 ENCOUNTER — Ambulatory Visit: Payer: Medicaid Other | Admitting: Physician Assistant

## 2018-09-25 ENCOUNTER — Ambulatory Visit (HOSPITAL_COMMUNITY): Payer: Medicaid Other

## 2018-10-04 ENCOUNTER — Other Ambulatory Visit: Payer: Self-pay | Admitting: Physician Assistant

## 2018-10-10 ENCOUNTER — Ambulatory Visit: Payer: Self-pay | Admitting: Nutrition

## 2018-11-01 ENCOUNTER — Ambulatory Visit (INDEPENDENT_AMBULATORY_CARE_PROVIDER_SITE_OTHER): Payer: Self-pay | Admitting: Family Medicine

## 2018-11-01 ENCOUNTER — Encounter: Payer: Self-pay | Admitting: Family Medicine

## 2018-11-01 VITALS — BP 119/79 | HR 65 | Temp 99.1°F | Resp 16 | Ht 64.0 in | Wt 250.0 lb

## 2018-11-01 DIAGNOSIS — I10 Essential (primary) hypertension: Secondary | ICD-10-CM

## 2018-11-01 DIAGNOSIS — E039 Hypothyroidism, unspecified: Secondary | ICD-10-CM

## 2018-11-01 DIAGNOSIS — J441 Chronic obstructive pulmonary disease with (acute) exacerbation: Secondary | ICD-10-CM

## 2018-11-01 DIAGNOSIS — B182 Chronic viral hepatitis C: Secondary | ICD-10-CM

## 2018-11-01 DIAGNOSIS — F39 Unspecified mood [affective] disorder: Secondary | ICD-10-CM

## 2018-11-01 DIAGNOSIS — Z23 Encounter for immunization: Secondary | ICD-10-CM

## 2018-11-01 LAB — POCT URINALYSIS DIPSTICK
Bilirubin, UA: NEGATIVE
Blood, UA: NEGATIVE
Glucose, UA: NEGATIVE
Ketones, UA: NEGATIVE
Leukocytes, UA: NEGATIVE
Nitrite, UA: NEGATIVE
Protein, UA: NEGATIVE
Spec Grav, UA: 1.02 (ref 1.010–1.025)
Urobilinogen, UA: 0.2 E.U./dL
pH, UA: 6 (ref 5.0–8.0)

## 2018-11-01 MED ORDER — LEVOTHYROXINE SODIUM 25 MCG PO TABS: 25.0000 ug | ORAL_TABLET | Freq: Every day | ORAL | 1 refills | Status: DC

## 2018-11-01 MED FILL — LEVOTHYROXINE 25 MCG TABLET: 25 | 30 days supply | Qty: 30 | Fill #0

## 2018-11-01 NOTE — Progress Notes (Signed)
Patient [facility]  New Patient Encounter Provider: Lanae Boast, [place]    EZM:[id number]  TKP:[id number]  DOB - October 18, 1975  SUBJECTIVE:   Deborah Barnes, is a 43 y.o. female who presents to establish care with this clinic.   Current problems/concerns:   Patient states that she has been out of synthroid for about 1 week. Patient states that she was on 19mcg without symptoms. Patient states that she was enrolled in treatment for Hep C. States that she completed the full course of harvoni and was supposed to follow up with labs and an ultrasound. She was unable to do so. Patient was being seen by [facility] endocrinology and [facility].  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  Patient with a history of incontinence x 5 years. Has never been on medications due to this. Patient states that she has to wear incontinence pads. Has hesitancy with urination. Does not feel that she is emptying her bladder fully.   Allergies  Allergen Reactions  . Clarithromycin Diarrhea and Other (See Comments)    Diarrhea and abd pain  . Propoxyphene Itching   Past Medical History:  Diagnosis Date  . Asthma   . Back pain   . Bulging disc   . COPD (chronic obstructive pulmonary disease) ([place])   . Degenerative disc disease   . Depression   . Depression with anxiety   . GERD (gastroesophageal reflux disease)   . Hepatitis C 08/2017  . Hypercholesterolemia   . Hypertension   . PTSD (post-traumatic stress disorder)   . Sciatica   . Shortness of breath   . Thyroid disease    Current Outpatient Medications on File Prior to Visit  Medication Sig Dispense Refill  . acetaminophen (TYLENOL) 500 MG tablet Take 500 mg by mouth every 8 (eight) hours as needed.    . citalopram (CELEXA) 20 MG tablet Take 1 tablet (20 mg total) by mouth daily. 30 tablet 11  . ibuprofen (ADVIL,MOTRIN) 200 MG tablet Take 800 mg by mouth every 8 (eight) hours as needed.      . loratadine (CLARITIN) 10 MG tablet Take 1 tablet (10 mg total) by mouth daily. For allergies 90 tablet 1  . Melatonin 3 MG TABS Take by mouth.    . metoprolol tartrate (LOPRESSOR) 50 MG tablet TAKE 1 Tablet  BY MOUTH TWICE DAILY 180 tablet 1  . Naproxen Sodium (ALEVE PO) Take 2 tablets by mouth as needed.     . nicotine (NICODERM CQ - DOSED IN MG/24 HOURS) 21 mg/24hr patch Place 21 mg onto the skin daily.    . nicotine polacrilex (NICORETTE) 4 MG gum Take 4 mg by mouth as needed for smoking cessation.    Marland Kitchen omeprazole (PRILOSEC) 40 MG capsule Take 1 capsule (40 mg total) by mouth daily. 90 capsule 3  . PROVENTIL HFA 108 (90 Base) MCG/ACT inhaler INHALE 1 TO 2 PUFFS BY MOUTH EVERY 6 HOURS AS NEEDED FOR COUGHING, WHEEZING, OR SHORTNESS OF BREATH 20.1 g 1  . Simethicone (GAS RELIEF PO) Take 1 tablet by mouth as needed.      No current facility-administered medications on file prior to visit.    Family History  Problem Relation Age of Onset  . Diabetes Father   . Colon cancer Maternal Grandmother   . Heart disease Paternal Grandmother   . Cancer Paternal Grandmother        esophageal  . Hypertension Paternal Grandmother   .  GER disease Paternal Grandmother   . Osteoporosis Paternal Grandmother    Social History   Socioeconomic History  . Marital status: Divorced    Spouse name: Not on file  . Number of children: Not on file  . Years of education: Not on file  . Highest education level: Not on file  Occupational History  . Not on file  Social Needs  . Financial resource strain: Not on file  . Food insecurity:    Worry: Not on file    Inability: Not on file  . Transportation needs:    Medical: Not on file    Non-medical: Not on file  Tobacco Use  . Smoking status: Former Smoker    Packs/day: 0.50    Years: 27.00    Pack years: 13.50    Last attempt to quit: 08/24/2018    Years since quitting: 0.1  . Smokeless tobacco: Never Used  Substance and Sexual Activity  .  Alcohol use: Not Currently    Alcohol/week: 1.0 standard drinks    Types: 1 Cans of beer per week    Frequency: Never    Comment: hx binge drinker. none since 12/16/17  . Drug use: Not Currently    Types: Marijuana, Cocaine, Methamphetamines, Heroin    Comment: no cocaine, heroin, meth since 11-27-2017. last marijuana use 01-21-18  . Sexual activity: Not Currently    Birth control/protection: Surgical  Lifestyle  . Physical activity:    Days per week: Not on file    Minutes per session: Not on file  . Stress: Not on file  Relationships  . Social connections:    Talks on phone: Not on file    Gets together: Not on file    Attends religious service: Not on file    Active member of club or organization: Not on file    Attends meetings of clubs or organizations: Not on file    Relationship status: Not on file  . Intimate partner violence:    Fear of current or ex partner: Not on file    Emotionally abused: Not on file    Physically abused: Not on file    Forced sexual activity: Not on file  Other Topics Concern  . Not on file  Social History Narrative  . Not on file    Review of Systems  Constitutional: Negative.   HENT: Negative.   Eyes: Negative.   Respiratory: Negative.   Cardiovascular: Negative.   Gastrointestinal: Negative.   Genitourinary: Negative.   Musculoskeletal: Positive for joint pain.  Skin: Negative.   Neurological: Negative.   Psychiatric/Behavioral: Negative for substance abuse (9 months sober. ).     OBJECTIVE:    BP 119/79 (BP Location: Left Arm, Patient Position: Sitting, Cuff Size: Large)   Pulse 65   Temp 99.1 F (37.3 C) (Oral)   Resp 16   Ht 5\' 4"  (1.626 m)   Wt 250 lb (113.4 kg)   LMP 10/09/2018   SpO2 100%   BMI 42.91 kg/m   Physical Exam  Constitutional: She is oriented to person, place, and time and well-developed, well-nourished, and in no distress. No distress.  HENT:  Head: Normocephalic and atraumatic.  Eyes: Pupils are  equal, round, and reactive to light. Conjunctivae and EOM are normal.  Neck: Normal range of motion. Neck supple.  Cardiovascular: Normal rate, regular rhythm, normal heart sounds and intact distal pulses. Exam reveals no gallop and no friction rub.  No murmur heard. Pulmonary/Chest: Effort normal and breath  sounds normal. No respiratory distress. She has no wheezes.  Abdominal: Soft. Bowel sounds are normal. There is no abdominal tenderness.  Musculoskeletal:     Right hip: She exhibits decreased range of motion, decreased strength, bony tenderness and crepitus.  Lymphadenopathy:    She has no cervical adenopathy.  Neurological: She is alert and oriented to person, place, and time. Gait normal.  Skin: Skin is warm and dry.  Psychiatric: Mood, memory, affect and judgment normal.  Nursing note and vitals reviewed.    ASSESSMENT/PLAN: 1. Hypertension, unspecified type No medication changes warranted at the present time.   - Urinalysis Dipstick - Comprehensive metabolic panel - CBC with Differential  2. Morbid obesity ([place]) The patient is asked to make an attempt to improve diet and exercise patterns to aid in medical management of this problem.   3. COPD exacerbation (HCC) Continue with inhaler PRN  4. Mood disorder ([place]) Continue with daymark.   5. Chronic hepatitis C without hepatic coma [facility]) Patient will apply for [facility] discount prior to having ultrasound.   6. Hypothyroidism, unspecified type Pending labs. Will adjust medications accordingly.    - levothyroxine (SYNTHROID, LEVOTHROID) 25 MCG tablet; Take 1 tablet (25 mcg total) by mouth daily before breakfast.  Dispense: 90 tablet; Refill: 1 - TSH    Return in about 8 weeks (around 12/27/2018) for Hypothyroidism/ HTN.  The patient was given clear instructions to go to ER or return to medical center if symptoms don't improve, worsen or new problems develop. The patient verbalized understanding. The patient was told to call  to get lab results if they haven't heard anything in the next week.     This note has been created with Surveyor, quantity. Any transcriptional errors are unintentional.   Ms. Andr L. Nathaneil Canary, Deborah Barnes [facility] [address] [place], [place] [address] [phone]

## 2018-11-01 NOTE — Patient Instructions (Signed)
Hypothyroidism  Hypothyroidism is when the thyroid gland does not make enough of certain hormones (it is underactive). The thyroid gland is a small gland located in the lower front part of the neck, just in front of the windpipe (trachea). This gland makes hormones that help control how the body uses food for energy (metabolism) as well as how the heart and brain function. These hormones also play a role in keeping your bones strong. When the thyroid is underactive, it produces too little of the hormones thyroxine (T4) and triiodothyronine (T3). What are the causes? This condition may be caused by:  Hashimoto's disease. This is a disease in which the body's disease-fighting system (immune system) attacks the thyroid gland. This is the most common cause.  Viral infections.  Pregnancy.  Certain medicines.  Birth defects.  Past radiation treatments to the head or neck for cancer.  Past treatment with radioactive iodine.  Past exposure to radiation in the environment.  Past surgical removal of part or all of the thyroid.  Problems with a gland in the center of the brain (pituitary gland).  Lack of enough iodine in the diet. What increases the risk? You are more likely to develop this condition if:  You are female.  You have a family history of thyroid conditions.  You use a medicine called lithium.  You take medicines that affect the immune system (immunosuppressants). What are the signs or symptoms? Symptoms of this condition include:  Feeling as though you have no energy (lethargy).  Not being able to tolerate cold.  Weight gain that is not explained by a change in diet or exercise habits.  Lack of appetite.  Dry skin.  Coarse hair.  Menstrual irregularity.  Slowing of thought processes.  Constipation.  Sadness or depression. How is this diagnosed? This condition may be diagnosed based on:  Your symptoms, your medical history, and a physical exam.  Blood  tests. You may also have imaging tests, such as an ultrasound or MRI. How is this treated? This condition is treated with medicine that replaces the thyroid hormones that your body does not make. After you begin treatment, it may take several weeks for symptoms to go away. Follow these instructions at home:  Take over-the-counter and prescription medicines only as told by your health care provider.  If you start taking any new medicines, tell your health care provider.  Keep all follow-up visits as told by your health care provider. This is important. ? As your condition improves, your dosage of thyroid hormone medicine may change. ? You will need to have blood tests regularly so that your health care provider can monitor your condition. Contact a health care provider if:  Your symptoms do not get better with treatment.  You are taking thyroid replacement medicine and you: ? Sweat a lot. ? Have tremors. ? Feel anxious. ? Lose weight rapidly. ? Cannot tolerate heat. ? Have emotional swings. ? Have diarrhea. ? Feel weak. Get help right away if you have:  Chest pain.  An irregular heartbeat.  A rapid heartbeat.  Difficulty breathing. Summary  Hypothyroidism is when the thyroid gland does not make enough of certain hormones (it is underactive).  When the thyroid is underactive, it produces too little of the hormones thyroxine (T4) and triiodothyronine (T3).  The most common cause is Hashimoto's disease, a disease in which the body's disease-fighting system (immune system) attacks the thyroid gland. The condition can also be caused by viral infections, medicine, pregnancy, or past   radiation treatment to the head or neck.  Symptoms may include weight gain, dry skin, constipation, feeling as though you do not have energy, and not being able to tolerate cold.  This condition is treated with medicine to replace the thyroid hormones that your body does not make. This information  is not intended to replace advice given to you by your health care provider. Make sure you discuss any questions you have with your health care provider. Document Released: 09/05/2005 Document Revised: 08/16/2017 Document Reviewed: 08/16/2017 Elsevier Interactive Patient Education  2019 Elsevier Inc.  

## 2018-11-06 ENCOUNTER — Telehealth: Payer: Self-pay

## 2018-11-06 DIAGNOSIS — E039 Hypothyroidism, unspecified: Secondary | ICD-10-CM

## 2018-11-07 MED ORDER — LEVOTHYROXINE SODIUM 25 MCG PO TABS: 25.0000 ug | ORAL_TABLET | Freq: Every day | ORAL | 1 refills | Status: DC

## 2018-11-07 NOTE — Telephone Encounter (Signed)
Refill has been sent to [facility]. Thanks!

## 2018-11-16 ENCOUNTER — Ambulatory Visit: Payer: Self-pay | Admitting: Family Medicine

## 2018-11-21 ENCOUNTER — Ambulatory Visit: Payer: Self-pay | Admitting: Family Medicine

## 2018-11-21 ENCOUNTER — Ambulatory Visit: Payer: Self-pay

## 2018-11-22 ENCOUNTER — Ambulatory Visit: Payer: Self-pay | Admitting: Family Medicine

## 2018-12-04 ENCOUNTER — Ambulatory Visit: Payer: Self-pay | Attending: Family Medicine

## 2018-12-04 ENCOUNTER — Telehealth: Payer: Self-pay | Admitting: Gastroenterology

## 2018-12-04 ENCOUNTER — Other Ambulatory Visit: Payer: Self-pay

## 2018-12-04 NOTE — Telephone Encounter (Signed)
Patient called and wanted to let you know she was not at [facility] anymore and she wants to let you know she has been clean for 10 months.  She is doing well and is at another facility

## 2018-12-05 ENCOUNTER — Ambulatory Visit: Payer: Self-pay | Admitting: Nurse Practitioner

## 2018-12-05 ENCOUNTER — Ambulatory Visit: Payer: Self-pay | Admitting: Gastroenterology

## 2018-12-07 NOTE — Telephone Encounter (Signed)
Noted.  Glad to hear this! 

## 2018-12-27 ENCOUNTER — Ambulatory Visit: Payer: Self-pay | Admitting: Family Medicine

## 2018-12-29 IMAGING — RF DG FLUORO GUIDE NDL PLC/BX
1 series · 1 of 1 positions shown · non-contrast
Comparison: none

CLINICAL DATA: Osteoarthritis RIGHT hip, RIGHT hip pain,
therapeutic injection

[Series 1: cp_standard · 0.19mm/px · 1 of 1 slices shown]
[im 1/1]
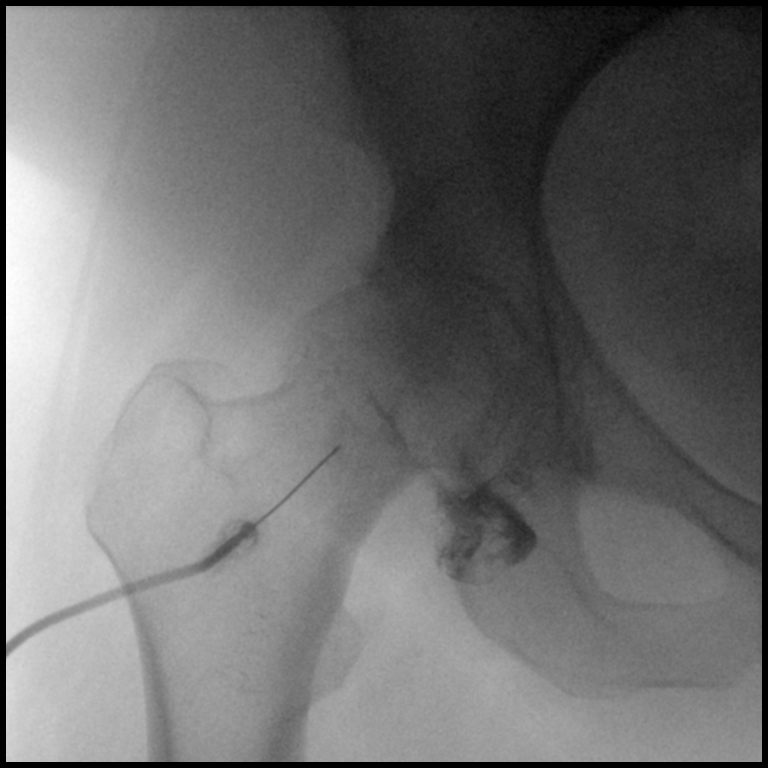

[1 of 1 positions shown; findings below may reference images not displayed]

EXAM:
RIGHT HIP INJECTION UNDER FLUOROSCOPY

FLUOROSCOPY TIME:  Fluoroscopy Time:  1 minutes 0 seconds

Radiation Exposure Index (if provided by the fluoroscopic device):
19.3 mGy

Number of Acquired Spot Images: 1

PROCEDURE:
Procedure, risks, benefits and alternatives explained to the
patient.

Patient's questions answered.

Written informed consent obtained.

Timeout protocol followed.

RIGHT hip joint localized by fluoroscopy.

Skin prepped and draped in usual sterile fashion.

Skin and soft tissues anesthetized with 6 mL of 1% lidocaine.

Under fluoroscopic guidance, 22 gauge spinal needle was advanced
into RIGHT hip joint.

Intra-articular position of the needle tip was confirmed with 2 mL
of [id number].

Patient was then injected with the requested 40 mg Depo-Medrol and 3
mL of 1% lidocaine.

Procedure tolerated well by patient without immediate complication.

Patient reports marked symptomatic improvement immediately following
the procedure.
IMPRESSION: Technically successful RIGHT hip injection under fluoroscopy.

## 2018-12-31 ENCOUNTER — Other Ambulatory Visit: Payer: Self-pay | Admitting: Physician Assistant

## 2019-01-11 ENCOUNTER — Other Ambulatory Visit: Payer: Self-pay | Admitting: *Deleted

## 2019-01-11 ENCOUNTER — Telehealth: Payer: Self-pay | Admitting: *Deleted

## 2019-01-11 ENCOUNTER — Ambulatory Visit (INDEPENDENT_AMBULATORY_CARE_PROVIDER_SITE_OTHER): Payer: Self-pay | Admitting: Gastroenterology

## 2019-01-11 ENCOUNTER — Encounter: Payer: Self-pay | Admitting: Gastroenterology

## 2019-01-11 ENCOUNTER — Other Ambulatory Visit: Payer: Self-pay

## 2019-01-11 DIAGNOSIS — B182 Chronic viral hepatitis C: Secondary | ICD-10-CM

## 2019-01-11 NOTE — Progress Notes (Signed)
Primary Care Physician:  Lanae Boast, FNP  Primary GI: Dr. Gala Romney   Virtual Visit via Telephone Note Due to COVID-19, visit is conducted virtually and was requested by patient.   I connected with Kerrigan Z Pfiester on 01/11/19 at 10:00 AM EDT by telephone and verified that I am speaking with the correct person using two identifiers.   I discussed the limitations, risks, security and privacy concerns of performing an evaluation and management service by telephone and the availability of in person appointments. I also discussed with the patient that there may be a patient responsible charge related to this service. The patient expressed understanding and agreed to proceed.  Chief Complaint  Patient presents with  . Hepatitis C    Was sick last week on stomach x 6 days,extreme diarrhea,nausea,pt put herself on BRAT diet     History of Present Illness: 43 year old female with history of chronic Hep C genotype 1a, Metavir F3/F4. No evidence of chronic Hep B. First dose of Harvoni on 9/16. She completed Harvoni in Dec 2019. Due for 3 month post-treatment labs. RUQ [place] due now.   Now at [facility]. Really likes it here. Thinks she had a flare up of IBS-D last week. Had nausea but no vomiting. Profuse diarrhea. Put herself on BRAT diet, which has improved symptoms. Now slowly introducing foods back and getting strength back.   Still with hip pain.   Past Medical History:  Diagnosis Date  . Asthma   . Back pain   . Bulging disc   . COPD (chronic obstructive pulmonary disease) ([place])   . Degenerative disc disease   . Depression   . Depression with anxiety   . GERD (gastroesophageal reflux disease)   . Hepatitis C 08/2017  . Hypercholesterolemia   . Hypertension   . PTSD (post-traumatic stress disorder)   . Sciatica   . Shortness of breath   . Thyroid disease      Past Surgical History:  Procedure Laterality Date  . BIOPSY N/A 09/25/2014   Procedure: BIOPSY;   Surgeon: Daneil Dolin, MD;  Location: [facility];  Service: Endoscopy;  Laterality: N/A;  Gastric, Ascending Colon, Descending/Sigmoid Colon   . CARPAL TUNNEL RELEASE Bilateral   . CHOLECYSTECTOMY N/A 04/26/2013   Procedure: LAPAROSCOPIC CHOLECYSTECTOMY;  Surgeon: Jamesetta So, MD;  Location: [facility];  Service: General;  Laterality: N/A;  . COLONOSCOPY WITH PROPOFOL N/A 09/25/2014   RMR: Pancolonic diverticulosis. multiple tubular adenomas, segmental biopsies negative. surveillance in 5 years  . ESOPHAGOGASTRODUODENOSCOPY (EGD) WITH PROPOFOL N/A 09/25/2014   RMR: Normal esophagus. Abnormal gastric mucosa status post biopsy (negative H.pylori). Hiatal hernia.   Fatima Blank HERNIA REPAIR N/A 10/04/2013   Procedure: HERNIA REPAIR INCISIONAL WITH MESH;  Surgeon: Jamesetta So, MD;  Location: [facility];  Service: General;  Laterality: N/A;  . INSERTION OF MESH N/A 10/04/2013   Procedure: INSERTION OF MESH;  Surgeon: Jamesetta So, MD;  Location: [facility];  Service: General;  Laterality: N/A;  . POLYPECTOMY N/A 09/25/2014   Procedure: POLYPECTOMY;  Surgeon: Daneil Dolin, MD;  Location: [facility];  Service: Endoscopy;  Laterality: N/A;  Cecal, Ascending Colon   . SHOULDER SURGERY    . TUBAL LIGATION       Current Meds  Medication Sig  . acetaminophen (TYLENOL) 500 MG tablet Take 500 mg by mouth every 8 (eight) hours as needed.  . citalopram (CELEXA) 20 MG tablet Take 1 tablet (20 mg total) by mouth  daily.  . ibuprofen (ADVIL,MOTRIN) 200 MG tablet Take 800 mg by mouth every 8 (eight) hours as needed.   Marland Kitchen levothyroxine (SYNTHROID, LEVOTHROID) 25 MCG tablet Take 1 tablet (25 mcg total) by mouth daily before breakfast.  . loratadine (CLARITIN) 10 MG tablet Take 1 tablet (10 mg total) by mouth daily. For allergies  . Melatonin 3 MG TABS Take by mouth at bedtime. 5 mg at bedtime  . metoprolol tartrate (LOPRESSOR) 50 MG tablet TAKE 1 Tablet  BY MOUTH TWICE DAILY  . Naproxen Sodium (ALEVE PO) Take 2 tablets by mouth  as needed.   . nicotine polacrilex (NICORETTE) 4 MG gum Take 4 mg by mouth as needed for smoking cessation.  Marland Kitchen omeprazole (PRILOSEC) 40 MG capsule Take 1 capsule (40 mg total) by mouth daily.  Marland Kitchen PROVENTIL HFA 108 (90 Base) MCG/ACT inhaler INHALE 1 TO 2 PUFFS BY MOUTH EVERY 6 HOURS AS NEEDED FOR COUGHING, WHEEZING, OR SHORTNESS OF BREATH  . Simethicone (GAS RELIEF PO) Take 1 tablet by mouth as needed.       Review of Systems: Gen: Denies fever, chills, anorexia. Denies fatigue, weakness, weight loss.  CV: Denies chest pain, palpitations, syncope, peripheral edema, and claudication. Resp: Denies dyspnea at rest, cough, wheezing, coughing up blood, and pleurisy. GI: see HPI Derm: Denies rash, itching, dry skin Psych: Denies depression, anxiety, memory loss, confusion. No homicidal or suicidal ideation.  Heme: Denies bruising, bleeding, and enlarged lymph nodes.  Observations/Objective: No distress. Unable to perform physical exam due to telephone encounter. No video available.   Assessment and Plan: Delightful 43 year old female completing Hep C treatment in Dec 2019, due for 3 month post-treatment labs now. RUQ [place] due as well. She is doing well from a GI standpoint and [facility].   Non-urgent RUQ [place] when COVID restrictions lifted. Labs to be done in May 2020. Return in 6 months.   Follow Up Instructions:    I discussed the assessment and treatment plan with the patient. The patient was provided an opportunity to ask questions and all were answered. The patient agreed with the plan and demonstrated an understanding of the instructions.   The patient was advised to call back or seek an in-person evaluation if the symptoms worsen or if the condition fails to improve as anticipated.  I provided 20 minutes of non-face-to-face time during this encounter.  Annitta Needs, PhD, ANP-BC [facility]

## 2019-01-11 NOTE — Progress Notes (Signed)
CC'ED TO PCP 

## 2019-01-11 NOTE — Telephone Encounter (Signed)
Checked evicore website for PA and received message "NOTE: This [facility] member does not require prior authorization for OUTPATIENT Radiology through [place] or [place] [facility] at this time."

## 2019-01-11 NOTE — Telephone Encounter (Signed)
RUQ u/s scheduled for 02/22/2019 at 10:30am, npo after midnight.  Patient aware. Confirmed address and letter mailed with appt info.

## 2019-01-11 NOTE — Patient Instructions (Signed)
I am glad you are doing so well!  This blood work can be done in May when [place] restrictions are lifted some. We will arrange a routine ultrasound in a few months to check your liver.  I would like to see you again in 6 months!  Please call if you need anything!  I enjoyed talking with you today! As you know, I value our relationship and want to provide genuine, compassionate, and quality care. I welcome your feedback. If you receive a survey regarding your visit,  I greatly appreciate you taking time to fill this out. See you next time!  Annitta Needs, PhD, ANP-BC [facility]

## 2019-01-29 ENCOUNTER — Other Ambulatory Visit: Payer: Self-pay | Admitting: Physician Assistant

## 2019-01-29 DIAGNOSIS — J449 Chronic obstructive pulmonary disease, unspecified: Secondary | ICD-10-CM

## 2019-02-13 ENCOUNTER — Telehealth: Payer: Self-pay | Admitting: Internal Medicine

## 2019-02-13 NOTE — Telephone Encounter (Signed)
Noted  

## 2019-02-13 NOTE — Telephone Encounter (Signed)
Pt wanted to let us know that due to her financial reasons she has put off having her labs and U/S done. She is working on getting assistance and once she does she will have labs and U/S done.

## 2019-02-19 ENCOUNTER — Ambulatory Visit: Payer: Self-pay | Admitting: Gastroenterology

## 2019-02-21 ENCOUNTER — Telehealth: Payer: Self-pay

## 2019-02-21 NOTE — Telephone Encounter (Signed)
Called to do COVID Screening for appointment tomorrow. No answer. Left a message to call back. Thanks! 

## 2019-02-22 ENCOUNTER — Encounter: Payer: Self-pay | Admitting: Family Medicine

## 2019-02-22 ENCOUNTER — Ambulatory Visit (HOSPITAL_COMMUNITY): Payer: Self-pay

## 2019-02-22 ENCOUNTER — Other Ambulatory Visit: Payer: Self-pay

## 2019-02-22 ENCOUNTER — Ambulatory Visit (INDEPENDENT_AMBULATORY_CARE_PROVIDER_SITE_OTHER): Payer: Self-pay | Admitting: Family Medicine

## 2019-02-22 VITALS — BP 130/94 | HR 80 | Temp 99.0°F | Resp 16 | Ht 64.0 in | Wt 262.0 lb

## 2019-02-22 DIAGNOSIS — R5383 Other fatigue: Secondary | ICD-10-CM

## 2019-02-22 DIAGNOSIS — J309 Allergic rhinitis, unspecified: Secondary | ICD-10-CM

## 2019-02-22 DIAGNOSIS — I10 Essential (primary) hypertension: Secondary | ICD-10-CM

## 2019-02-22 DIAGNOSIS — F39 Unspecified mood [affective] disorder: Secondary | ICD-10-CM

## 2019-02-22 DIAGNOSIS — M719 Bursopathy, unspecified: Secondary | ICD-10-CM

## 2019-02-22 LAB — POCT URINALYSIS DIPSTICK
Bilirubin, UA: NEGATIVE
Blood, UA: NEGATIVE
Glucose, UA: NEGATIVE
Ketones, UA: NEGATIVE
Leukocytes, UA: NEGATIVE
Nitrite, UA: NEGATIVE
Protein, UA: NEGATIVE
Spec Grav, UA: 1.025 (ref 1.010–1.025)
Urobilinogen, UA: 0.2 E.U./dL
pH, UA: 6 (ref 5.0–8.0)

## 2019-02-22 MED ORDER — CITALOPRAM HYDROBROMIDE 20 MG PO TABS: 20.0000 mg | ORAL_TABLET | Freq: Every day | ORAL | 5 refills | Status: DC

## 2019-02-22 MED ORDER — METOPROLOL TARTRATE 50 MG PO TABS: 50.0000 mg | ORAL_TABLET | Freq: Two times a day (BID) | ORAL | 1 refills | Status: DC

## 2019-02-22 MED ORDER — PREDNISONE 10 MG (21) PO TBPK: ORAL_TABLET | ORAL | 0 refills | Status: DC

## 2019-02-22 MED ORDER — LORATADINE 10 MG PO TABS: 10.0000 mg | ORAL_TABLET | Freq: Every day | ORAL | 1 refills | Status: DC

## 2019-02-22 MED FILL — CITALOPRAM HBR 20 MG TABLET: 20 | 30 days supply | Qty: 30 | Fill #0

## 2019-02-22 NOTE — Progress Notes (Signed)
[facility]   Progress Note: General Provider: Lanae Boast, FNP  SUBJECTIVE:   Deborah Barnes is a 43 y.o. female who  has a past medical history of Asthma, Back pain, Bulging disc, COPD (chronic obstructive pulmonary disease) ([place]), Degenerative disc disease, Depression, Depression with anxiety, GERD (gastroesophageal reflux disease), Hepatitis C (08/2017), Hypercholesterolemia, Hypertension, PTSD (post-traumatic stress disorder), Sciatica, Shortness of breath, and Thyroid disease.. Patient presents today for Hypertension and Fatigue (no energy ) Hypertension  This is a chronic problem. The current episode started more than 1 year ago. The problem is unchanged. The problem is controlled. Associated symptoms include malaise/fatigue. Risk factors for coronary artery disease include obesity and sedentary lifestyle. Past treatments include beta blockers. The current treatment provides moderate improvement. Compliance problems include exercise, diet and psychosocial issues.    She also states that she is having increased joint pain and is now ambulating with a walker that she received from a friend.   Review of Systems  Constitutional: Positive for malaise/fatigue.  HENT: Negative.   Eyes: Negative.   Respiratory: Negative.   Cardiovascular: Negative.   Gastrointestinal: Negative.   Genitourinary: Negative.   Musculoskeletal: Positive for joint pain and myalgias.  Skin: Negative.   Neurological: Negative.   Psychiatric/Behavioral: Negative.      OBJECTIVE: BP (!) 130/94 (BP Location: Right Arm, Patient Position: Sitting, Cuff Size: Normal)   Pulse 80   Temp 99 F (37.2 C) (Oral)   Resp 16   Ht 5\' 4"  (1.626 m)   Wt 262 lb (118.8 kg)   LMP 02/11/2019   SpO2 100%   BMI 44.97 kg/m   Wt Readings from Last 3 Encounters:  02/22/19 262 lb (118.8 kg)  11/01/18 250 lb (113.4 kg)  09/03/18 246 lb 3.2 oz (111.7 kg)     Physical  Exam Vitals signs and nursing note reviewed.  Constitutional:      General: She is not in acute distress.    Appearance: Normal appearance.  HENT:     Head: Normocephalic and atraumatic.  Eyes:     Extraocular Movements: Extraocular movements intact.     Conjunctiva/sclera: Conjunctivae normal.     Pupils: Pupils are equal, round, and reactive to light.  Cardiovascular:     Rate and Rhythm: Normal rate and regular rhythm.     Heart sounds: No murmur.  Pulmonary:     Effort: Pulmonary effort is normal.     Breath sounds: Normal breath sounds.  Musculoskeletal: Normal range of motion.  Skin:    General: Skin is warm and dry.  Neurological:     Mental Status: She is alert and oriented to person, place, and time.     Comments: Ambulating with walker  Psychiatric:        Mood and Affect: Mood normal.        Behavior: Behavior normal.        Thought Content: Thought content normal.        Judgment: Judgment normal.     ASSESSMENT/PLAN:  1. Hypertension, unspecified type - Urinalysis Dipstick - metoprolol tartrate (LOPRESSOR) 50 MG tablet; Take 1 tablet (50 mg total) by mouth 2 (two) times daily.  Dispense: 180 tablet; Refill: 1  2. Allergic rhinitis, unspecified seasonality, unspecified trigger - loratadine (CLARITIN) 10 MG tablet; Take 1 tablet (10 mg total) by mouth daily. For allergies  Dispense: 90 tablet; Refill: 1  3. Fatigue, unspecified type Labs ordered. Pending.  - Vitamin B12 -  Thyroid Panel With TSH - CBC with Differential - [facility]  4. Mood disorder (HCC) - citalopram (CELEXA) 20 MG tablet; Take 1 tablet (20 mg total) by mouth daily.  Dispense: 30 tablet; Refill: 5  5. Bursitis of multiple sites Will do a short dose of prednisone.  - predniSONE (STERAPRED UNI-PAK 21 TAB) 10 MG (21) TBPK tablet; Take as directed on pack  Dispense: 21 tablet; Refill: 0   Return in about 3 months (around 05/25/2019) for htn/fatigue.    The patient was given clear instructions  to go to ER or return to medical center if symptoms do not improve, worsen or new problems develop. The patient verbalized understanding and agreed with plan of care.   Ms. Doug Sou. Nathaneil Canary, FNP-BC [facility] [address] [place], [place] [address] [phone]

## 2019-02-22 NOTE — Patient Instructions (Signed)
Allergic Rhinitis, Adult Allergic rhinitis is a reaction to allergens in the air. Allergens are tiny specks (particles) in the air that cause your body to have an allergic reaction. This condition cannot be passed from person to person (is not contagious). Allergic rhinitis cannot be cured, but it can be controlled. There are two types of allergic rhinitis:  Seasonal. This type is also called hay fever. It happens only during certain times of the year.  Perennial. This type can happen at any time of the year. What are the causes? This condition may be caused by:  Pollen from grasses, trees, and weeds.  House dust mites.  Pet dander.  Mold. What are the signs or symptoms? Symptoms of this condition include:  Sneezing.  Runny or stuffy nose (nasal congestion).  A lot of mucus in the back of the throat (postnasal drip).  Itchy nose.  Tearing of the eyes.  Trouble sleeping.  Being sleepy during day. How is this treated? There is no cure for this condition. You should avoid things that trigger your symptoms (allergens). Treatment can help to relieve symptoms. This may include:  Medicines that block allergy symptoms, such as antihistamines. These may be given as a shot, nasal spray, or pill.  Shots that are given until your body becomes less sensitive to the allergen (desensitization).  Stronger medicines, if all other treatments have not worked. Follow these instructions at home: Avoiding allergens   Find out what you are allergic to. Common allergens include smoke, dust, and pollen.  Avoid them if you can. These are some of the things that you can do to avoid allergens: ? Replace carpet with wood, tile, or vinyl flooring. Carpet can trap dander and dust. ? Clean any mold found in the home. ? Do not smoke. Do not allow smoking in your home. ? Change your heating and air conditioning filter at least once a month. ? During allergy season:  Keep windows closed as much as  you can. If possible, use air conditioning when there is a lot of pollen in the air.  Use a special filter for allergies with your furnace and air conditioner.  Plan outdoor activities when pollen counts are lowest. This is usually during the early morning or evening hours.  If you do go outdoors when pollen count is high, wear a special mask for people with allergies.  When you come indoors, take a shower and change your clothes before sitting on furniture or bedding. General instructions  Do not use fans in your home.  Do not hang clothes outside to dry.  Wear sunglasses to keep pollen out of your eyes.  Wash your hands right away after you touch household pets.  Take over-the-counter and prescription medicines only as told by your doctor.  Keep all follow-up visits as told by your doctor. This is important. Contact a doctor if:  You have a fever.  You have a cough that does not go away (is persistent).  You start to make whistling sounds when you breathe (wheeze).  Your symptoms do not get better with treatment.  You have thick fluid coming from your nose.  You start to have nosebleeds. Get help right away if:  Your tongue or your lips are swollen.  You have trouble breathing.  You feel dizzy or you feel like you are going to pass out (faint).  You have cold sweats. Summary  Allergic rhinitis is a reaction to allergens in the air.  This condition may be  caused by allergens. These include pollen, dust mites, pet dander, and mold.  Symptoms include a runny, itchy nose, sneezing, or tearing eyes. You may also have trouble sleeping or feel sleepy during the day.  Treatment includes taking medicines and avoiding allergens. You may also get shots or take stronger medicines.  Get help if you have a fever or a cough that does not stop. Get help right away if you are short of breath. This information is not intended to replace advice given to you by your health care  provider. Make sure you discuss any questions you have with your health care provider. Document Released: 01/05/2011 Document Revised: 03/27/2018 Document Reviewed: 03/27/2018 Elsevier Interactive Patient Education  2019 [employer]. Fatigue If you have fatigue, you feel tired all the time and have a lack of energy or a lack of motivation. Fatigue may make it difficult to start or complete tasks because of exhaustion. In general, occasional or mild fatigue is often a normal response to activity or life. However, long-lasting (chronic) or extreme fatigue may be a symptom of a medical condition. Follow these instructions at home: General instructions  Watch your fatigue for any changes.  Go to bed and get up at the same time every day.  Avoid fatigue by pacing yourself during the day and getting enough sleep at night.  Maintain a healthy weight. Medicines  Take over-the-counter and prescription medicines only as told by your health care provider.  Take a multivitamin, if told by your health care provider.  Do not use herbal or dietary supplements unless they are approved by your health care provider. Activity   Exercise regularly, as told by your health care provider.  Use or practice techniques to help you relax, such as yoga, tai chi, meditation, or massage therapy. Eating and drinking   Avoid heavy meals in the evening.  Eat a well-balanced diet, which includes lean proteins, whole grains, plenty of fruits and vegetables, and low-fat dairy products.  Avoid consuming too much caffeine.  Avoid the use of alcohol.  Drink enough fluid to keep your urine pale yellow. Lifestyle  Change situations that cause you stress. Try to keep your work and personal schedule in balance.  Do not use any products that contain nicotine or tobacco, such as cigarettes and e-cigarettes. If you need help quitting, ask your health care provider.  Do not use drugs. Contact a health care  provider if:  Your fatigue does not get better.  You have a fever.  You suddenly lose or gain weight.  You have headaches.  You have trouble falling asleep or sleeping through the night.  You feel angry, guilty, anxious, or sad.  You are unable to have a bowel movement (constipation).  Your skin is dry.  You have swelling in your legs or another part of your body. Get help right away if:  You feel confused.  Your vision is blurry.  You feel faint or you pass out.  You have a severe headache.  You have severe pain in your abdomen, your back, or the area between your waist and hips (pelvis).  You have chest pain, shortness of breath, or an irregular or fast heartbeat.  You are unable to urinate, or you urinate less than normal.  You have abnormal bleeding, such as bleeding from the rectum, vagina, nose, lungs, or nipples.  You vomit blood.  You have thoughts about hurting yourself or others. If you ever feel like you may hurt yourself or others,  or have thoughts about taking your own life, get help right away. You can go to your nearest emergency department or call:  Your local emergency services (911 in the [place]  A suicide crisis helpline, such as the [place] at (812)442-8221. This is open 24 hours a day. Summary  If you have fatigue, you feel tired all the time and have a lack of energy or a lack of motivation.  Fatigue may make it difficult to start or complete tasks because of exhaustion.  Long-lasting (chronic) or extreme fatigue may be a symptom of a medical condition.  Exercise regularly, as told by your health care provider.  Change situations that cause you stress. Try to keep your work and personal schedule in balance. This information is not intended to replace advice given to you by your health care provider. Make sure you discuss any questions you have with your health care provider. Document Released: 07/03/2007  Document Revised: 05/31/2017 Document Reviewed: 05/31/2017 Elsevier Interactive Patient Education  [employer].

## 2019-02-23 LAB — CBC WITH DIFFERENTIAL/PLATELET
Basophils Absolute: 0 10*3/uL (ref 0.0–0.2)
Basos: 0 %
EOS (ABSOLUTE): 0.3 10*3/uL (ref 0.0–0.4)
Eos: 3 %
Hematocrit: 36 % (ref 34.0–46.6)
Hemoglobin: 12.3 g/dL (ref 11.1–15.9)
Immature Grans (Abs): 0 10*3/uL (ref 0.0–0.1)
Immature Granulocytes: 0 %
Lymphocytes Absolute: 2.7 10*3/uL (ref 0.7–3.1)
Lymphs: 29 %
MCH: 30.8 pg (ref 26.6–33.0)
MCHC: 34.2 g/dL (ref 31.5–35.7)
MCV: 90 fL (ref 79–97)
Monocytes Absolute: 0.6 10*3/uL (ref 0.1–0.9)
Monocytes: 6 %
Neutrophils Absolute: 5.7 10*3/uL (ref 1.4–7.0)
Neutrophils: 62 %
Platelets: 305 10*3/uL (ref 150–450)
RBC: 3.99 x10E6/uL (ref 3.77–5.28)
RDW: 12.4 % (ref 11.7–15.4)
WBC: 9.2 10*3/uL (ref 3.4–10.8)

## 2019-02-23 LAB — THYROID PANEL WITH TSH
Free Thyroxine Index: 1.4 (ref 1.2–4.9)
T3 Uptake Ratio: 21 % — ABNORMAL LOW (ref 24–39)
T4, Total: 6.8 ug/dL (ref 4.5–12.0)
TSH: 1.33 u[IU]/mL (ref 0.450–4.500)

## 2019-02-23 LAB — CREATININE KINASE MB: CK-MB Index: 1.3 ng/mL (ref 0.0–5.3)

## 2019-02-23 LAB — VITAMIN B12: Vitamin B-12: 408 pg/mL (ref 232–1245)

## 2019-03-04 NOTE — Progress Notes (Signed)
Labs are stable. Continue with current medications. Remember to keep your follow up appointments as scheduled.

## 2019-03-25 MED FILL — CITALOPRAM HBR 20 MG TABLET: 20 | 30 days supply | Qty: 30 | Fill #1

## 2019-03-26 ENCOUNTER — Other Ambulatory Visit: Payer: Self-pay

## 2019-03-26 ENCOUNTER — Emergency Department (HOSPITAL_COMMUNITY): Payer: Self-pay

## 2019-03-26 ENCOUNTER — Emergency Department (HOSPITAL_COMMUNITY)
Admission: EM | Admit: 2019-03-26 | Discharge: 2019-03-26 | Disposition: A | Discharge status: Home / Self Care | Payer: Self-pay | Attending: Emergency Medicine | Admitting: Emergency Medicine

## 2019-03-26 ENCOUNTER — Encounter (HOSPITAL_COMMUNITY): Payer: Self-pay

## 2019-03-26 DIAGNOSIS — E039 Hypothyroidism, unspecified: Secondary | ICD-10-CM | POA: Insufficient documentation

## 2019-03-26 DIAGNOSIS — I10 Essential (primary) hypertension: Secondary | ICD-10-CM | POA: Insufficient documentation

## 2019-03-26 DIAGNOSIS — J45909 Unspecified asthma, uncomplicated: Secondary | ICD-10-CM | POA: Insufficient documentation

## 2019-03-26 DIAGNOSIS — M1611 Unilateral primary osteoarthritis, right hip: Secondary | ICD-10-CM | POA: Insufficient documentation

## 2019-03-26 DIAGNOSIS — J449 Chronic obstructive pulmonary disease, unspecified: Secondary | ICD-10-CM | POA: Insufficient documentation

## 2019-03-26 DIAGNOSIS — Z79899 Other long term (current) drug therapy: Secondary | ICD-10-CM | POA: Insufficient documentation

## 2019-03-26 DIAGNOSIS — Z87891 Personal history of nicotine dependence: Secondary | ICD-10-CM | POA: Insufficient documentation

## 2019-03-26 LAB — URINALYSIS, ROUTINE W REFLEX MICROSCOPIC
Bilirubin Urine: NEGATIVE
Glucose, UA: NEGATIVE mg/dL
Hgb urine dipstick: NEGATIVE
Ketones, ur: NEGATIVE mg/dL
Leukocytes,Ua: NEGATIVE
Nitrite: NEGATIVE
Protein, ur: NEGATIVE mg/dL
Specific Gravity, Urine: 1.01 (ref 1.005–1.030)
pH: 6 (ref 5.0–8.0)

## 2019-03-26 LAB — CBC WITH DIFFERENTIAL/PLATELET
Abs Immature Granulocytes: 0.02 10*3/uL (ref 0.00–0.07)
Basophils Absolute: 0 10*3/uL (ref 0.0–0.1)
Basophils Relative: 0 %
Eosinophils Absolute: 0.4 10*3/uL (ref 0.0–0.5)
Eosinophils Relative: 4 %
HCT: 36.3 % (ref 36.0–46.0)
Hemoglobin: 12 g/dL (ref 12.0–15.0)
Immature Granulocytes: 0 %
Lymphocytes Relative: 30 %
Lymphs Abs: 2.8 10*3/uL (ref 0.7–4.0)
MCH: 30.8 pg (ref 26.0–34.0)
MCHC: 33.1 g/dL (ref 30.0–36.0)
MCV: 93.3 fL (ref 80.0–100.0)
Monocytes Absolute: 0.6 10*3/uL (ref 0.1–1.0)
Monocytes Relative: 7 %
Neutro Abs: 5.6 10*3/uL (ref 1.7–7.7)
Neutrophils Relative %: 59 %
Platelets: 284 10*3/uL (ref 150–400)
RBC: 3.89 MIL/uL (ref 3.87–5.11)
RDW: 13.1 % (ref 11.5–15.5)
WBC: 9.5 10*3/uL (ref 4.0–10.5)
nRBC: 0 % (ref 0.0–0.2)

## 2019-03-26 LAB — COMPREHENSIVE METABOLIC PANEL
ALT: 13 U/L (ref 0–44)
AST: 14 U/L — ABNORMAL LOW (ref 15–41)
Albumin: 3.9 g/dL (ref 3.5–5.0)
Alkaline Phosphatase: 60 U/L (ref 38–126)
Anion gap: 6 (ref 5–15)
BUN: 12 mg/dL (ref 6–20)
CO2: 25 mmol/L (ref 22–32)
Calcium: 8.8 mg/dL — ABNORMAL LOW (ref 8.9–10.3)
Chloride: 107 mmol/L (ref 98–111)
Creatinine, Ser: 0.58 mg/dL (ref 0.44–1.00)
GFR calc Af Amer: >60 mL/min (ref >60)
GFR calc non Af Amer: >60 mL/min (ref >60)
Glucose, Bld: 99 mg/dL (ref 70–99)
Potassium: 3.9 mmol/L (ref 3.5–5.1)
Sodium: 138 mmol/L (ref 135–145)
Total Bilirubin: 0.4 mg/dL (ref 0.3–1.2)
Total Protein: 7.6 g/dL (ref 6.5–8.1)

## 2019-03-26 LAB — SEDIMENTATION RATE: Sed Rate: 27 mm/hr — ABNORMAL HIGH (ref 0–22)

## 2019-03-26 LAB — HCG, QUANTITATIVE, PREGNANCY: hCG, Beta Chain, Quant, S: <1 m[IU]/mL (ref <5)

## 2019-03-26 LAB — C-REACTIVE PROTEIN: CRP: 1.9 mg/dL — ABNORMAL HIGH (ref <1.0)

## 2019-03-26 MED ORDER — SODIUM CHLORIDE 0.9 % IV BOLUS
1000.0000 mL | Freq: Once | INTRAVENOUS | Status: AC
  Administered 2019-03-26: 1000 mL via INTRAVENOUS

## 2019-03-26 MED ORDER — KETOROLAC TROMETHAMINE 30 MG/ML IJ SOLN
30.0000 mg | Freq: Once | INTRAMUSCULAR | Status: AC
  Administered 2019-03-26: 30 mg via INTRAVENOUS
  Filled 2019-03-26: qty 1

## 2019-03-26 MED ORDER — GADOBUTROL 1 MMOL/ML IV SOLN: 10.0000 mL | Freq: Once | INTRAVENOUS | Status: DC | PRN

## 2019-03-26 MED ORDER — LIDOCAINE 5 % EX PTCH: 1.0000 | MEDICATED_PATCH | CUTANEOUS | 0 refills | Status: DC

## 2019-03-26 NOTE — Discharge Instructions (Signed)
Use lidocaine patches as prescribed.  Follow-up with orthopedics.

## 2019-03-26 NOTE — ED Notes (Addendum)
Pt taken to bathroom in wheelchair to provide urine specimen
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
## 2019-03-26 NOTE — ED Notes (Signed)
Patient transported to MRI 

## 2019-03-26 NOTE — ED Notes (Signed)
Pt. Aware of urine specimen. Urine specimen was unsuccessful. Pt. Missed the cup. Will collect urine when pt voids. Nurse aware.

## 2019-03-26 NOTE — ED Triage Notes (Signed)
Patient c/o right hip pain and right groin swelling and pain.

## 2019-04-24 ENCOUNTER — Other Ambulatory Visit: Payer: Self-pay | Admitting: Family Medicine

## 2019-04-24 DIAGNOSIS — M719 Bursopathy, unspecified: Secondary | ICD-10-CM

## 2019-04-24 DIAGNOSIS — E039 Hypothyroidism, unspecified: Secondary | ICD-10-CM

## 2019-04-24 MED FILL — CITALOPRAM HBR 20 MG TABLET: 20 | 30 days supply | Qty: 30 | Fill #2

## 2019-05-01 ENCOUNTER — Encounter: Payer: Self-pay | Admitting: Family Medicine

## 2019-05-01 ENCOUNTER — Ambulatory Visit (INDEPENDENT_AMBULATORY_CARE_PROVIDER_SITE_OTHER): Payer: Self-pay | Admitting: Family Medicine

## 2019-05-01 ENCOUNTER — Other Ambulatory Visit: Payer: Self-pay

## 2019-05-01 VITALS — BP 112/63 | HR 70 | Temp 98.8°F | Resp 16 | Ht 64.0 in | Wt 271.0 lb

## 2019-05-01 DIAGNOSIS — M25519 Pain in unspecified shoulder: Secondary | ICD-10-CM

## 2019-05-01 DIAGNOSIS — J3489 Other specified disorders of nose and nasal sinuses: Secondary | ICD-10-CM

## 2019-05-01 DIAGNOSIS — E162 Hypoglycemia, unspecified: Secondary | ICD-10-CM

## 2019-05-01 DIAGNOSIS — G8929 Other chronic pain: Secondary | ICD-10-CM

## 2019-05-01 LAB — POCT GLYCOSYLATED HEMOGLOBIN (HGB A1C): Hemoglobin A1C: 5.8 % — AB (ref 4.0–5.6)

## 2019-05-01 LAB — GLUCOSE, POCT (MANUAL RESULT ENTRY): POC Glucose: 101 mg/dl — AB (ref 70–99)

## 2019-05-01 MED ORDER — DICLOFENAC SODIUM 1 % TD GEL: 4.0000 g | Freq: Four times a day (QID) | TRANSDERMAL | 2 refills | Status: DC

## 2019-05-01 MED ORDER — MUPIROCIN 2 % EX OINT: 1.0000 "application " | TOPICAL_OINTMENT | Freq: Two times a day (BID) | CUTANEOUS | 0 refills | Status: DC

## 2019-05-01 MED FILL — DICLOFENAC SODIUM 1% GEL: 1 | 6 days supply | Qty: 100 | Fill #0

## 2019-05-01 MED FILL — MUPIROCIN 2% OINTMENT: 2 | 11 days supply | Qty: 22 | Fill #0

## 2019-05-01 NOTE — Progress Notes (Signed)
[facility]   Progress Note: Sick Visit Provider: Lanae Boast, FNP  SUBJECTIVE:   Deborah Barnes is a 43 y.o. female who  has a past medical history of Asthma, Back pain, Bulging disc, COPD (chronic obstructive pulmonary disease) ([place]), Degenerative disc disease, Depression, Depression with anxiety, GERD (gastroesophageal reflux disease), Hepatitis C (08/2017), Hypercholesterolemia, Hypertension, PTSD (post-traumatic stress disorder), Sciatica, Shortness of breath, and Thyroid disease.. Patient presents today for Hypoglycemia (pt states sugars have been low in 50's and 60's ( not diabetic ) several episodes in the last month ), Shoulder Pain (wants rx for voltern ), and Nose Problem (wants nose checked because she smells a strange smell )  Patient states that she does not like to eat breakfast and has had low blood sugars. She states that she has now started eating more protein and has not had low blood sugars. She states that she would like to have a prescription for voltaren gel for her shoulder pain. Also states that she has a "stale" smell in her nose x 2 weeks.  Review of Systems  HENT:       Change in smell   Musculoskeletal: Positive for joint pain (bilateral shoulders).  All other systems reviewed and are negative.    OBJECTIVE: BP 112/63 (BP Location: Left Arm, Patient Position: Sitting, Cuff Size: Large)   Pulse 70   Temp 98.8 F (37.1 C) (Oral)   Resp 16   Ht 5\' 4"  (1.626 m)   Wt 271 lb (122.9 kg)   LMP 04/08/2019   SpO2 100%   BMI 46.52 kg/m   Wt Readings from Last 3 Encounters:  05/01/19 271 lb (122.9 kg)  03/26/19 240 lb (108.9 kg)  02/22/19 262 lb (118.8 kg)     Physical Exam Vitals signs and nursing note reviewed.  Constitutional:      General: She is not in acute distress.    Appearance: Normal appearance.  HENT:     Head: Normocephalic and atraumatic.     Nose:     Comments: Small lesion noted to the  medial portion of the left nare. No signs of infection noted.  Eyes:     Extraocular Movements: Extraocular movements intact.     Conjunctiva/sclera: Conjunctivae normal.     Pupils: Pupils are equal, round, and reactive to light.  Cardiovascular:     Rate and Rhythm: Normal rate and regular rhythm.     Heart sounds: No murmur.  Pulmonary:     Effort: Pulmonary effort is normal.     Breath sounds: Normal breath sounds.  Musculoskeletal: Normal range of motion.  Skin:    General: Skin is warm and dry.  Neurological:     Mental Status: She is alert and oriented to person, place, and time.     Gait: Gait abnormal (ambulating with walker).  Psychiatric:        Mood and Affect: Mood normal.        Behavior: Behavior normal.        Thought Content: Thought content normal.        Judgment: Judgment normal.     ASSESSMENT/PLAN:  1. Hypoglycemia Discussed eating small meals that are high in protein throughout the day.  - HgB A1c - Glucose (CBG)  2. Internal nasal lesion - mupirocin ointment (BACTROBAN) 2 %; Place 1 application into the nose 2 (two) times daily.  Dispense: 22 g; Refill: 0  3. Chronic shoulder pain, unspecified laterality -  diclofenac sodium (VOLTAREN) 1 % GEL; Apply 4 g topically 4 (four) times daily.  Dispense: 150 g; Refill: 2         The patient was given clear instructions to go to ER or return to medical center if symptoms do not improve, worsen or new problems develop. The patient verbalized understanding and agreed with plan of care.   Ms. Doug Sou. Nathaneil Canary, FNP-BC [facility] [address] [place], [place] [address] [phone]     This note has been created with Dragon speech recognition software and smart phrase technology. Any transcriptional errors are unintentional.

## 2019-05-01 NOTE — Patient Instructions (Signed)
Hypoglycemia Hypoglycemia is when the sugar (glucose) level in your blood is too low. Signs of low blood sugar may include:  Feeling: ? Hungry. ? Worried or nervous (anxious). ? Sweaty and clammy. ? Confused. ? Dizzy. ? Sleepy. ? Sick to your stomach (nauseous).  Having: ? A fast heartbeat. ? A headache. ? A change in your vision. ? Tingling or no feeling (numbness) around your mouth, lips, or tongue. ? Jerky movements that you cannot control (seizure).  Having trouble with: ? Moving (coordination). ? Sleeping. ? Passing out (fainting). ? Getting upset easily (irritability). Low blood sugar can happen to people who have diabetes and people who do not have diabetes. Low blood sugar can happen quickly, and it can be an emergency. Treating low blood sugar Low blood sugar is often treated by eating or drinking something sugary right away, such as:  Fruit juice, 4-6 oz (120-150 mL).  Regular soda (not diet soda), 4-6 oz (120-150 mL).  Low-fat milk, 4 oz (120 mL).  Several pieces of hard candy.  Sugar or honey, 1 Tbsp (15 mL). Treating low blood sugar if you have diabetes If you can think clearly and swallow safely, follow the 15:15 rule:  Take 15 grams of a fast-acting carb (carbohydrate). Talk with your doctor about how much you should take.  Always keep a source of fast-acting carb with you, such as: ? Sugar tablets (glucose pills). Take 3-4 pills. ? 6-8 pieces of hard candy. ? 4-6 oz (120-150 mL) of fruit juice. ? 4-6 oz (120-150 mL) of regular (not diet) soda. ? 1 Tbsp (15 mL) honey or sugar.  Check your blood sugar 15 minutes after you take the carb.  If your blood sugar is still at or below 70 mg/dL (3.9 mmol/L), take 15 grams of a carb again.  If your blood sugar does not go above 70 mg/dL (3.9 mmol/L) after 3 tries, get help right away.  After your blood sugar goes back to normal, eat a meal or a snack within 1 hour.  Treating very low blood sugar If your  blood sugar is at or below 54 mg/dL (3 mmol/L), you have very low blood sugar (severe hypoglycemia). This may also cause:  Passing out.  Jerky movements you cannot control (seizure).  Losing consciousness (coma). This is an emergency. Do not wait to see if the symptoms will go away. Get medical help right away. Call your local emergency services (911 in the [place] Do not drive yourself to the hospital. If you have very low blood sugar and you cannot eat or drink, you may need a glucagon shot (injection). A family member or friend should learn how to check your blood sugar and how to give you a glucagon shot. Ask your doctor if you need to have a glucagon shot kit at home. Follow these instructions at home: General instructions  Take over-the-counter and prescription medicines only as told by your doctor.  Stay aware of your blood sugar as told by your doctor.  Limit alcohol intake to no more than 1 drink a day for nonpregnant women and 2 drinks a day for men. One drink equals 12 oz of beer (355 mL), 5 oz of wine (148 mL), or 1 oz of hard liquor (44 mL).  Keep all follow-up visits as told by your doctor. This is important. If you have diabetes:   Follow your diabetes care plan as told by your doctor. Make sure you: ? Know the signs of low blood sugar. ?  Take your medicines as told. ? Follow your exercise and meal plan. ? Eat on time. Do not skip meals. ? Check your blood sugar as often as told by your doctor. Always check it before and after exercise. ? Follow your sick day plan when you cannot eat or drink normally. Make this plan ahead of time with your doctor.  Share your diabetes care plan with: ? Your work or school. ? People you live with.  Check your pee (urine) for ketones: ? When you are sick. ? As told by your doctor.  Carry a card or wear jewelry that says you have diabetes. Contact a doctor if:  You have trouble keeping your blood sugar in your target range.   You have low blood sugar often. Get help right away if:  You still have symptoms after you eat or drink something sugary.  Your blood sugar is at or below 54 mg/dL (3 mmol/L).  You have jerky movements that you cannot control.  You pass out. These symptoms may be an emergency. Do not wait to see if the symptoms will go away. Get medical help right away. Call your local emergency services (911 in the [place] Do not drive yourself to the hospital. Summary  Hypoglycemia happens when the level of sugar (glucose) in your blood is too low.  Low blood sugar can happen to people who have diabetes and people who do not have diabetes. Low blood sugar can happen quickly, and it can be an emergency.  Make sure you know the signs of low blood sugar and know how to treat it.  Always keep a source of sugar (fast-acting carb) with you to treat low blood sugar. This information is not intended to replace advice given to you by your health care provider. Make sure you discuss any questions you have with your health care provider. Document Released: 11/30/2009 Document Revised: 12/27/2018 Document Reviewed: 10/09/2015 Elsevier Patient Education  2020 [employer].  

## 2019-05-06 ENCOUNTER — Ambulatory Visit: Payer: Medicaid Other

## 2019-05-07 ENCOUNTER — Ambulatory Visit: Payer: Self-pay | Attending: Family Medicine

## 2019-05-07 ENCOUNTER — Other Ambulatory Visit: Payer: Self-pay

## 2019-05-16 ENCOUNTER — Other Ambulatory Visit: Payer: Self-pay

## 2019-05-28 MED FILL — CITALOPRAM HBR 20 MG TABLET: 20 | 30 days supply | Qty: 30 | Fill #3

## 2019-05-29 ENCOUNTER — Encounter (HOSPITAL_COMMUNITY): Payer: Self-pay

## 2019-05-29 ENCOUNTER — Ambulatory Visit (INDEPENDENT_AMBULATORY_CARE_PROVIDER_SITE_OTHER): Payer: Self-pay | Admitting: Family Medicine

## 2019-05-29 ENCOUNTER — Encounter (HOSPITAL_COMMUNITY): Payer: Self-pay | Admitting: *Deleted

## 2019-05-29 ENCOUNTER — Encounter: Payer: Self-pay | Admitting: Family Medicine

## 2019-05-29 ENCOUNTER — Other Ambulatory Visit: Payer: Self-pay

## 2019-05-29 VITALS — BP 117/80 | HR 75 | Temp 98.5°F | Resp 16 | Ht 64.0 in | Wt 277.0 lb

## 2019-05-29 DIAGNOSIS — Z23 Encounter for immunization: Secondary | ICD-10-CM

## 2019-05-29 DIAGNOSIS — I10 Essential (primary) hypertension: Secondary | ICD-10-CM

## 2019-05-29 DIAGNOSIS — J449 Chronic obstructive pulmonary disease, unspecified: Secondary | ICD-10-CM

## 2019-05-29 DIAGNOSIS — E039 Hypothyroidism, unspecified: Secondary | ICD-10-CM

## 2019-05-29 DIAGNOSIS — M654 Radial styloid tenosynovitis [de Quervain]: Secondary | ICD-10-CM

## 2019-05-29 LAB — POCT URINALYSIS DIPSTICK
Bilirubin, UA: NEGATIVE
Blood, UA: NEGATIVE
Glucose, UA: NEGATIVE
Ketones, UA: NEGATIVE
Leukocytes, UA: NEGATIVE
Nitrite, UA: NEGATIVE
Protein, UA: NEGATIVE
Spec Grav, UA: 1.01 (ref 1.010–1.025)
Urobilinogen, UA: 0.2 E.U./dL
pH, UA: 5.5 (ref 5.0–8.0)

## 2019-05-29 MED ORDER — METHYLPREDNISOLONE 4 MG PO TBPK: ORAL_TABLET | ORAL | 0 refills | Status: DC

## 2019-05-29 NOTE — Progress Notes (Signed)
[facility]   Progress Note: General Provider: Lanae Boast, FNP  SUBJECTIVE:   Deborah Barnes is a 43 y.o. female who  has a past medical history of Asthma, Back pain, Bulging disc, COPD (chronic obstructive pulmonary disease) ([place]), Degenerative disc disease, Depression, Depression with anxiety, GERD (gastroesophageal reflux disease), Hepatitis C (08/2017), Hypercholesterolemia, Hypertension, PTSD (post-traumatic stress disorder), Sciatica, Shortness of breath, and Thyroid disease.. Patient presents today for Hypertension, Pain (in wrist and fingers both hands ), and Follow-up (wants a note for group home on restrictions since she has a walker ) Patient with right wrist pain. 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 She is currently in a rehab facility. She has not taken any pain medications for this issue. She does have a history of osteoarthritis and ambulates with a walker.  Patient with limited physical activity and exercise due to arthritis. She is unable to limit her sodium intake due to being at the rehab facility.  Patient reports compliance with all medications.  Patient denies side effects of medications.     Review of Systems  Constitutional: Negative.   HENT: Negative.   Eyes: Negative.   Respiratory: Negative.   Cardiovascular: Negative.   Gastrointestinal: Negative.   Genitourinary: Negative.   Musculoskeletal: Positive for joint pain and myalgias.  Skin: Negative.   Neurological: Negative.   Psychiatric/Behavioral: Negative.      OBJECTIVE: BP 117/80 (BP Location: Left Arm, Patient Position: Sitting, Cuff Size: Large)   Pulse 75   Temp 98.5 F (36.9 C) (Oral)   Resp 16   Ht 5\' 4"  (1.626 m)   Wt 277 lb (125.6 kg)   LMP 04/29/2019   SpO2 99%   BMI 47.55 kg/m   Wt Readings from Last 3 Encounters:  05/29/19 277 lb (125.6 kg)  05/01/19 271 lb (122.9 kg)   03/26/19 240 lb (108.9 kg)     Physical Exam Vitals signs and nursing note reviewed.  Constitutional:      General: She is not in acute distress.    Appearance: Normal appearance.  HENT:     Head: Normocephalic and atraumatic.  Eyes:     Extraocular Movements: Extraocular movements intact.     Conjunctiva/sclera: Conjunctivae normal.     Pupils: Pupils are equal, round, and reactive to light.  Cardiovascular:     Rate and Rhythm: Normal rate and regular rhythm.     Heart sounds: No murmur.  Pulmonary:     Effort: Pulmonary effort is normal.     Breath sounds: Normal breath sounds.  Musculoskeletal:     Right wrist: She exhibits tenderness (Positive Finkelstein. Right wrist).  Skin:    General: Skin is warm and dry.  Neurological:     Mental Status: She is alert and oriented to person, place, and time.  Psychiatric:        Mood and Affect: Mood normal.        Behavior: Behavior normal.        Thought Content: Thought content normal.        Judgment: Judgment normal.     ASSESSMENT/PLAN:   1. Hypertension, unspecified type - Urinalysis Dipstick  2. Tendinitis, de Quervain's - methylPREDNISolone (MEDROL DOSEPAK) 4 MG TBPK tablet; Take as directed on pack  Dispense: 21 tablet; Refill: 0 - AMB referral to orthopedics  3. Hypothyroidism, unspecified type  4. Chronic obstructive pulmonary disease, unspecified COPD type (  [place])  5. Need for influenza vaccination Influenza vaccination given in the office visit today. - Flu Vaccine QUAD 6+ mos PF IM (Fluarix Quad PF)   Letter written for patient with work restrictions due to limited ROM.   Return in about 3 months (around 08/28/2019) for HTN.    The patient was given clear instructions to go to ER or return to medical center if symptoms do not improve, worsen or new problems develop. The patient verbalized understanding and agreed with plan of care.   Ms. Doug Sou. Nathaneil Canary, FNP-BC [facility] [address] [place], [place] [address] [phone]

## 2019-05-29 NOTE — Patient Instructions (Signed)
De Quervain's Tenosynovitis  De Quervain's tenosynovitis is a condition that causes inflammation of the tendon on the thumb side of the wrist. Tendons are cords of tissue that connect bones to muscles. The tendons in the hand pass through a tunnel called a sheath. A slippery layer of tissue (synovium) lets the tendons move smoothly in the sheath. With de Quervain's tenosynovitis, the sheath swells or thickens, causing friction and pain. The condition is also called de Quervain's disease and de Quervain's syndrome. It occurs most often in women who are 30-50 years old. What are the causes? The exact cause of this condition is not known. It may be associated with overuse of the hand and wrist. What increases the risk? You are more likely to develop this condition if you:  Use your hands far more than normal, especially if you repeat certain movements that involve twisting your hand or using a tight grip.  Are pregnant.  Are a middle-aged woman.  Have rheumatoid arthritis.  Have diabetes. What are the signs or symptoms? The main symptom of this condition is pain on the thumb side of the wrist. The pain may get worse when you grasp something or turn your wrist. Other symptoms may include:  Pain that extends up the forearm.  Swelling of your wrist and hand.  Trouble moving the thumb and wrist.  A sensation of snapping in the wrist.  A bump filled with fluid (cyst) in the area of the pain. How is this diagnosed? This condition may be diagnosed based on:  Your symptoms and medical history.  A physical exam. During the exam, your health care provider may do a simple test (Finkelstein test) that involves pulling your thumb and wrist to see if this causes pain. You may also need to have an X-ray. How is this treated? Treatment for this condition may include:  Avoiding any activity that causes pain and swelling.  Taking medicines. Anti-inflammatory medicines and corticosteroid  injections may be used to reduce inflammation and relieve pain.  Wearing a splint.  Having surgery. This may be needed if other treatments do not work. Once the pain and swelling has gone down:  Physical therapy. This includes stretching and strengthening exercises.  Occupational therapy. This includes adjusting how you move your wrist. Follow these instructions at home: If you have a splint:  Wear the splint as told by your health care provider. Remove it only as told by your health care provider.  Loosen the splint if your fingers tingle, become numb, or turn cold and blue.  Keep the splint clean.  If the splint is not waterproof: ? Do not let it get wet. ? Cover it with a watertight covering when you take a bath or a shower. Managing pain, stiffness, and swelling   Avoid movements and activities that cause pain and swelling in the wrist area.  If directed, put ice on the painful area. This may be helpful after doing activities that involve the sore wrist. ? Put ice in a plastic bag. ? Place a towel between your skin and the bag. ? Leave the ice on for 20 minutes, 2-3 times a day.  Move your fingers often to avoid stiffness and to lessen swelling.  Raise (elevate) the injured area above the level of your heart while you are sitting or lying down. General instructions  Return to your normal activities as told by your health care provider. Ask your health care provider what activities are safe for you.  Take over-the-counter   and prescription medicines only as told by your health care provider.  Keep all follow-up visits as told by your health care provider. This is important. Contact a health care provider if:  Your pain medicine does not help.  Your pain gets worse.  You develop new symptoms. Summary  De Quervain's tenosynovitis is a condition that causes inflammation of the tendon on the thumb side of the wrist.  The condition occurs most often in women who are  30-50 years old.  The exact cause of this condition is not known. It may be associated with overuse of the hand and wrist.  Treatment starts with avoiding activity that causes pain or swelling in the wrist area. Other treatment may include wearing a splint and taking medicine. Sometimes, surgery is needed. This information is not intended to replace advice given to you by your health care provider. Make sure you discuss any questions you have with your health care provider. Document Released: 05/31/2001 Document Revised: 03/08/2018 Document Reviewed: 08/14/2017 Elsevier Patient Education  2020 [employer]vier Inc.  

## 2019-06-04 ENCOUNTER — Ambulatory Visit: Payer: Medicaid Other | Admitting: Orthopaedic Surgery

## 2019-06-06 ENCOUNTER — Other Ambulatory Visit: Payer: Self-pay

## 2019-06-06 DIAGNOSIS — Z20822 Contact with and (suspected) exposure to covid-19: Secondary | ICD-10-CM

## 2019-06-08 LAB — NOVEL CORONAVIRUS, NAA: SARS-CoV-2, NAA: DETECTED — AB

## 2019-06-13 ENCOUNTER — Other Ambulatory Visit: Payer: Self-pay

## 2019-06-13 ENCOUNTER — Encounter (HOSPITAL_COMMUNITY): Payer: Self-pay | Admitting: *Deleted

## 2019-06-13 ENCOUNTER — Emergency Department (HOSPITAL_COMMUNITY): Payer: Self-pay

## 2019-06-13 ENCOUNTER — Emergency Department (HOSPITAL_COMMUNITY)
Admission: EM | Admit: 2019-06-13 | Discharge: 2019-06-13 | Disposition: A | Discharge status: Home / Self Care | Payer: Self-pay | Attending: Emergency Medicine | Admitting: Emergency Medicine

## 2019-06-13 DIAGNOSIS — Z79899 Other long term (current) drug therapy: Secondary | ICD-10-CM | POA: Insufficient documentation

## 2019-06-13 DIAGNOSIS — U071 COVID-19: Secondary | ICD-10-CM | POA: Insufficient documentation

## 2019-06-13 DIAGNOSIS — E039 Hypothyroidism, unspecified: Secondary | ICD-10-CM | POA: Insufficient documentation

## 2019-06-13 DIAGNOSIS — J449 Chronic obstructive pulmonary disease, unspecified: Secondary | ICD-10-CM | POA: Insufficient documentation

## 2019-06-13 DIAGNOSIS — R0602 Shortness of breath: Secondary | ICD-10-CM | POA: Insufficient documentation

## 2019-06-13 DIAGNOSIS — J45909 Unspecified asthma, uncomplicated: Secondary | ICD-10-CM | POA: Insufficient documentation

## 2019-06-13 DIAGNOSIS — F1210 Cannabis abuse, uncomplicated: Secondary | ICD-10-CM | POA: Insufficient documentation

## 2019-06-13 DIAGNOSIS — Z87891 Personal history of nicotine dependence: Secondary | ICD-10-CM | POA: Insufficient documentation

## 2019-06-13 DIAGNOSIS — I10 Essential (primary) hypertension: Secondary | ICD-10-CM | POA: Insufficient documentation

## 2019-06-13 DIAGNOSIS — F1410 Cocaine abuse, uncomplicated: Secondary | ICD-10-CM | POA: Insufficient documentation

## 2019-06-13 HISTORY — DX: Hypoglycemia, unspecified: E16.2

## 2019-06-13 LAB — CBC WITH DIFFERENTIAL/PLATELET
Abs Immature Granulocytes: 0.03 10*3/uL (ref 0.00–0.07)
Basophils Absolute: 0 10*3/uL (ref 0.0–0.1)
Basophils Relative: 0 %
Eosinophils Absolute: 0.2 10*3/uL (ref 0.0–0.5)
Eosinophils Relative: 3 %
HCT: 36.9 % (ref 36.0–46.0)
Hemoglobin: 11.8 g/dL — ABNORMAL LOW (ref 12.0–15.0)
Immature Granulocytes: 1 %
Lymphocytes Relative: 37 %
Lymphs Abs: 2.2 10*3/uL (ref 0.7–4.0)
MCH: 29.5 pg (ref 26.0–34.0)
MCHC: 32 g/dL (ref 30.0–36.0)
MCV: 92.3 fL (ref 80.0–100.0)
Monocytes Absolute: 0.3 10*3/uL (ref 0.1–1.0)
Monocytes Relative: 5 %
Neutro Abs: 3.3 10*3/uL (ref 1.7–7.7)
Neutrophils Relative %: 54 %
Platelets: 256 10*3/uL (ref 150–400)
RBC: 4 MIL/uL (ref 3.87–5.11)
RDW: 13.1 % (ref 11.5–15.5)
WBC: 6.1 10*3/uL (ref 4.0–10.5)
nRBC: 0 % (ref 0.0–0.2)

## 2019-06-13 LAB — BASIC METABOLIC PANEL
Anion gap: 10 (ref 5–15)
BUN: 10 mg/dL (ref 6–20)
CO2: 22 mmol/L (ref 22–32)
Calcium: 8.9 mg/dL (ref 8.9–10.3)
Chloride: 107 mmol/L (ref 98–111)
Creatinine, Ser: 0.6 mg/dL (ref 0.44–1.00)
GFR calc Af Amer: >60 mL/min (ref >60)
GFR calc non Af Amer: >60 mL/min (ref >60)
Glucose, Bld: 85 mg/dL (ref 70–99)
Potassium: 3.9 mmol/L (ref 3.5–5.1)
Sodium: 139 mmol/L (ref 135–145)

## 2019-06-13 MED ORDER — IBUPROFEN 800 MG PO TABS
800.0000 mg | ORAL_TABLET | Freq: Once | ORAL | Status: AC
  Administered 2019-06-13: 800 mg via ORAL
  Filled 2019-06-13: qty 1

## 2019-06-13 MED ORDER — ACETAMINOPHEN 500 MG PO TABS
1000.0000 mg | ORAL_TABLET | Freq: Once | ORAL | Status: AC
  Administered 2019-06-13: 1000 mg via ORAL
  Filled 2019-06-13: qty 2

## 2019-06-13 NOTE — ED Triage Notes (Signed)
Pt got tested for COVID and was informed she was COVID positive on Saturday.  Over the past few days, pt reports SOB and some chest pain.  Pt also endorses diarrhea. Pt a/o x 4 and normally ambulates with a walker.
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
## 2019-06-17 ENCOUNTER — Other Ambulatory Visit: Payer: Self-pay

## 2019-06-17 DIAGNOSIS — M654 Radial styloid tenosynovitis [de Quervain]: Secondary | ICD-10-CM

## 2019-06-17 MED ORDER — METHYLPREDNISOLONE 4 MG PO TBPK: ORAL_TABLET | ORAL | 0 refills | Status: DC

## 2019-06-17 MED FILL — ?METHYLPREDNISOLONE 4 MG TA: 4 | 6 days supply | Qty: 21 | Fill #0

## 2019-06-17 NOTE — Telephone Encounter (Signed)
Patient called, [facility] did not have medrol dosepak in stock. [facility] Thanks!

## 2019-06-24 MED FILL — CITALOPRAM HBR 20 MG TABLET: 20 | 30 days supply | Qty: 30 | Fill #4

## 2019-07-09 ENCOUNTER — Other Ambulatory Visit: Payer: Self-pay

## 2019-07-09 ENCOUNTER — Telehealth: Payer: Self-pay | Admitting: Gastroenterology

## 2019-07-09 DIAGNOSIS — B182 Chronic viral hepatitis C: Secondary | ICD-10-CM

## 2019-07-09 NOTE — Telephone Encounter (Signed)
[phone] OR [phone] PLEASE CALL PATIENT ABOUT LAB ORDER, LIVES IN [place] NOW AND WOULD LIKE TO BE ABLE TO GO SOMEWHERE IN [place] FOR HER LABS

## 2019-07-09 NOTE — Telephone Encounter (Signed)
FYI to Anna Boone, NP.  

## 2019-07-09 NOTE — Telephone Encounter (Signed)
The lab orders have been faxed to [facility].

## 2019-07-09 NOTE — Telephone Encounter (Signed)
PT said she has appointment with Roseanne Kaufman, NP on 07/16/2019. She has not done the labs Vicente Males ordered in April for her. She wants to do those before her visit. I will re-enter the labs and fax to [facility] @ [place] per pt request. The phone number there is [phone]    Fax: [phone]

## 2019-07-13 LAB — BASIC METABOLIC PANEL
BUN/Creatinine Ratio: 17 (ref 9–23)
BUN: 11 mg/dL (ref 6–24)
CO2: 21 mmol/L (ref 20–29)
Calcium: 9.2 mg/dL (ref 8.7–10.2)
Chloride: 105 mmol/L (ref 96–106)
Creatinine, Ser: 0.65 mg/dL (ref 0.57–1.00)
GFR calc Af Amer: 125 mL/min/{1.73_m2} (ref >59)
GFR calc non Af Amer: 108 mL/min/{1.73_m2} (ref >59)
Glucose: 104 mg/dL — ABNORMAL HIGH (ref 65–99)
Potassium: 4.4 mmol/L (ref 3.5–5.2)
Sodium: 139 mmol/L (ref 134–144)

## 2019-07-13 LAB — CBC WITH DIFFERENTIAL/PLATELET
Basophils Absolute: 0.1 10*3/uL (ref 0.0–0.2)
Basos: 1 %
EOS (ABSOLUTE): 0.3 10*3/uL (ref 0.0–0.4)
Eos: 3 %
Hematocrit: 35.4 % (ref 34.0–46.6)
Hemoglobin: 11.8 g/dL (ref 11.1–15.9)
Immature Grans (Abs): 0 10*3/uL (ref 0.0–0.1)
Immature Granulocytes: 0 %
Lymphocytes Absolute: 2.7 10*3/uL (ref 0.7–3.1)
Lymphs: 32 %
MCH: 29.4 pg (ref 26.6–33.0)
MCHC: 33.3 g/dL (ref 31.5–35.7)
MCV: 88 fL (ref 79–97)
Monocytes Absolute: 0.5 10*3/uL (ref 0.1–0.9)
Monocytes: 7 %
Neutrophils Absolute: 4.7 10*3/uL (ref 1.4–7.0)
Neutrophils: 57 %
Platelets: 324 10*3/uL (ref 150–450)
RBC: 4.02 x10E6/uL (ref 3.77–5.28)
RDW: 13.1 % (ref 11.7–15.4)
WBC: 8.2 10*3/uL (ref 3.4–10.8)

## 2019-07-13 LAB — HEPATIC FUNCTION PANEL
ALT: 12 IU/L (ref 0–32)
AST: 12 IU/L (ref 0–40)
Albumin: 4.1 g/dL (ref 3.8–4.8)
Alkaline Phosphatase: 73 IU/L (ref 39–117)
Bilirubin Total: <0.2 mg/dL (ref 0.0–1.2)
Bilirubin, Direct: 0.06 mg/dL (ref 0.00–0.40)
Total Protein: 6.4 g/dL (ref 6.0–8.5)

## 2019-07-13 LAB — PROTIME-INR
INR: 0.9 (ref 0.9–1.2)
Prothrombin Time: 9.8 s (ref 9.1–12.0)

## 2019-07-13 LAB — HCV RNA QUANT: Hepatitis C Quantitation: NOT DETECTED IU/mL

## 2019-07-16 ENCOUNTER — Telehealth: Payer: Self-pay

## 2019-07-16 ENCOUNTER — Encounter: Payer: Self-pay | Admitting: Gastroenterology

## 2019-07-16 ENCOUNTER — Ambulatory Visit (INDEPENDENT_AMBULATORY_CARE_PROVIDER_SITE_OTHER): Payer: Self-pay | Admitting: Gastroenterology

## 2019-07-16 ENCOUNTER — Other Ambulatory Visit: Payer: Self-pay

## 2019-07-16 DIAGNOSIS — K219 Gastro-esophageal reflux disease without esophagitis: Secondary | ICD-10-CM

## 2019-07-16 DIAGNOSIS — Z860100 Personal history of colon polyps, unspecified: Secondary | ICD-10-CM

## 2019-07-16 DIAGNOSIS — B182 Chronic viral hepatitis C: Secondary | ICD-10-CM

## 2019-07-16 NOTE — Addendum Note (Signed)
Addended by: Annitta Needs on: 07/16/2019 11:46 AM   Modules accepted: Orders

## 2019-07-16 NOTE — Progress Notes (Signed)
Primary Care Physician:  Lanae Boast, FNP  Primary GI: Dr. Gala Romney   Patient Location: Home   Provider Location: [facility] office   Reason for Visit: Hep C follow-up.    Persons present on the virtual encounter, with roles: Patient and NP.    Total time (minutes) spent on medical discussion: 15 minutes   Due to COVID-19, visit was conducted using virtual method.  Visit was requested by patient.  Virtual Visit via Telephone Note Due to COVID-19, visit is conducted virtually and was requested by patient.   I connected with Deborah Barnes on 07/16/19 at 11:00 AM EDT by telephone and verified that I am speaking with the correct person using two identifiers.   I discussed the limitations, risks, security and privacy concerns of performing an evaluation and management service by telephone and the availability of in person appointments. I also discussed with the patient that there may be a patient responsible charge related to this service. The patient expressed understanding and agreed to proceed.  Chief Complaint  Patient presents with  . Hepatitis C     History of Present Illness: 43 year old female with history of chronic Hep C genotype 1a, Metavir F3/F4. No evidence of chronic Hep B. First dose of Harvoni on 9/16. She completed Harvoni in Dec 2019. She attained SVR. Recent HCV RNA negative. LFTs normal. Overdue for RUQ [place].   Hep A/B vaccinations: still needs to complete.   Sees orthopedist next week. Uses a walker at times and sometimes a wheelchair due to significant pain. Nov 7th will be 18 months clean. Mom passed away on Jul 08, 2023.   She is doing Recruitment consultant at [employer]. Enjoying working at [employer]. No abdominal pain. GERD controlled with omeprazole. No dysphagia. No lower GI concerns.   Colonoscopy due in  2021.   Past Medical History:  Diagnosis Date  . Asthma   . Back pain   . Bulging disc   . COPD (chronic obstructive pulmonary disease) ([place])   .  Degenerative disc disease   . Depression   . Depression with anxiety   . GERD (gastroesophageal reflux disease)   . Hepatitis C 08/2017  . Hypercholesterolemia   . Hypertension   . Hypoglycemia   . PTSD (post-traumatic stress disorder)   . Sciatica   . Shortness of breath   . Thyroid disease      Past Surgical History:  Procedure Laterality Date  . BIOPSY N/A 09/25/2014   Procedure: BIOPSY;  Surgeon: Daneil Dolin, MD;  Location: [facility];  Service: Endoscopy;  Laterality: N/A;  Gastric, Ascending Colon, Descending/Sigmoid Colon   . CARPAL TUNNEL RELEASE Bilateral   . CHOLECYSTECTOMY N/A 04/26/2013   Procedure: LAPAROSCOPIC CHOLECYSTECTOMY;  Surgeon: Jamesetta So, MD;  Location: [facility];  Service: General;  Laterality: N/A;  . COLONOSCOPY WITH PROPOFOL N/A 09/25/2014   RMR: Pancolonic diverticulosis. multiple tubular adenomas, segmental biopsies negative. surveillance in 5 years  . ESOPHAGOGASTRODUODENOSCOPY (EGD) WITH PROPOFOL N/A 09/25/2014   RMR: Normal esophagus. Abnormal gastric mucosa status post biopsy (negative H.pylori). Hiatal hernia.   Fatima Blank HERNIA REPAIR N/A 10/04/2013   Procedure: HERNIA REPAIR INCISIONAL WITH MESH;  Surgeon: Jamesetta So, MD;  Location: [facility];  Service: General;  Laterality: N/A;  . INSERTION OF MESH N/A 10/04/2013   Procedure: INSERTION OF MESH;  Surgeon: Jamesetta So, MD;  Location: [facility];  Service: General;  Laterality: N/A;  . POLYPECTOMY N/A 09/25/2014   Procedure: POLYPECTOMY;  Surgeon:  Daneil Dolin, MD;  Location: [facility];  Service: Endoscopy;  Laterality: N/A;  Cecal, Ascending Colon   . SHOULDER SURGERY    . TUBAL LIGATION       Current Meds  Medication Sig  . acetaminophen (TYLENOL) 500 MG tablet Take 500 mg by mouth every 8 (eight) hours as needed for mild pain or headache.   Marland Kitchen CALCIUM PO Take 1 tablet by mouth daily.  . citalopram (CELEXA) 20 MG tablet Take 1 tablet (20 mg total) by mouth daily.  . diclofenac sodium (VOLTAREN) 1 %  GEL Apply 4 g topically 4 (four) times daily. (Patient taking differently: Apply 4 g topically as needed. )  . ibuprofen (ADVIL,MOTRIN) 200 MG tablet Take 800 mg by mouth every 8 (eight) hours as needed for fever or moderate pain.   Marland Kitchen loratadine (CLARITIN) 10 MG tablet Take 1 tablet (10 mg total) by mouth daily. For allergies  . metoprolol tartrate (LOPRESSOR) 50 MG tablet Take 1 tablet (50 mg total) by mouth 2 (two) times daily.  . mupirocin ointment (BACTROBAN) 2 % Place 1 application into the nose 2 (two) times daily. (Patient taking differently: Place 1 application into the nose as needed. )  . omeprazole (PRILOSEC) 40 MG capsule Take 1 capsule (40 mg total) by mouth daily.  Marland Kitchen PROVENTIL HFA 108 (90 Base) MCG/ACT inhaler INHALE 1 TO 2 PUFFS BY MOUTH EVERY 6 HOURS AS NEEDED FOR COUGHING, WHEEZING, OR SHORTNESS OF BREATH (Patient taking differently: Inhale 1-2 puffs into the lungs every 6 (six) hours as needed for wheezing or shortness of breath. )  . Simethicone (GAS RELIEF PO) Take 1 tablet by mouth as needed (gas).   . sodium chloride (OCEAN) 0.65 % SOLN nasal spray Place 1 spray into both nostrils as needed for congestion.  Marland Kitchen SYNTHROID 25 MCG tablet TAKE 1 Tablet BY MOUTH ONCE DAILY BEFORE BREAKFAST (Patient taking differently: Take 25 mcg by mouth daily before breakfast. )     Family History  Problem Relation Age of Onset  . Diabetes Father   . Colon cancer Maternal Grandmother   . Heart disease Paternal Grandmother   . Cancer Paternal Grandmother        esophageal  . Hypertension Paternal Grandmother   . GER disease Paternal Grandmother   . Osteoporosis Paternal Grandmother     Social History   Socioeconomic History  . Marital status: Divorced    Spouse name: Not on file  . Number of children: Not on file  . Years of education: Not on file  . Highest education level: Not on file  Occupational History  . Not on file  Social Needs  . Financial resource strain: Not on file  .  Food insecurity    Worry: Not on file    Inability: Not on file  . Transportation needs    Medical: Not on file    Non-medical: Not on file  Tobacco Use  . Smoking status: Former Smoker    Packs/day: 0.50    Years: 27.00    Pack years: 13.50    Quit date: 08/24/2018    Years since quitting: 0.8  . Smokeless tobacco: Never Used  Substance and Sexual Activity  . Alcohol use: Not Currently    Alcohol/week: 1.0 standard drinks    Types: 1 Cans of beer per week    Frequency: Never    Comment: hx binge drinker. none since 12/16/17  . Drug use: Not Currently    Types: Marijuana, Cocaine,  Methamphetamines, Heroin    Comment: no cocaine, heroin, meth since 11-27-2017. last marijuana use 01-21-18  . Sexual activity: Not Currently    Birth control/protection: Surgical  Lifestyle  . Physical activity    Days per week: Not on file    Minutes per session: Not on file  . Stress: Not on file  Relationships  . Social Herbalist on phone: Not on file    Gets together: Not on file    Attends religious service: Not on file    Active member of club or organization: Not on file    Attends meetings of clubs or organizations: Not on file    Relationship status: Not on file  Other Topics Concern  . Not on file  Social History Narrative  . Not on file       Review of Systems: Gen: Denies fever, chills, anorexia. Denies fatigue, weakness, weight loss.  CV: Denies chest pain, palpitations, syncope, peripheral edema, and claudication. Resp: Denies dyspnea at rest, cough, wheezing, coughing up blood, and pleurisy. GI: see HPI Derm: Denies rash, itching, dry skin Psych: Denies depression, anxiety, memory loss, confusion. No homicidal or suicidal ideation.  Heme: Denies bruising, bleeding, and enlarged lymph nodes.  Observations/Objective: No distress. Unable to perform physical exam due to telephone encounter. No video available.   Assessment and Plan: 43 year old very pleasant  female with history of chronic Hep C, Metavir F3/F4, completing treatment and attaining SVR. Recent labs reviewed with undetectable RNA. Completed treatment with Harvoni Dec 2019.    Need to update [place] elastography, as this will help determine surveillance going forward. Hep A/B vaccinations also were not completed as previously recommended due to inability to obtain vaccinations where she resided and the barriers with COVID-19.   Due for colonoscopy 2021 with history of polyps. Will arrange office visit in Jan 2021.   GERD: remains controlled with omeprazole.   Follow Up Instructions: See AVS.   I discussed the assessment and treatment plan with the patient. The patient was provided an opportunity to ask questions and all were answered. The patient agreed with the plan and demonstrated an understanding of the instructions.   The patient was advised to call back or seek an in-person evaluation if the symptoms worsen or if the condition fails to improve as anticipated.  I provided 15 minutes of non-face-to-face time during this telephone encounter.  Annitta Needs, PhD, ANP-BC [facility]

## 2019-07-16 NOTE — Patient Instructions (Signed)
We are arranging another ultrasound in the near future.  We have sent the Hep A/B vaccinations on the mail to you.  I would like to see you in Jan 2021 face-to-face if possible, and we can arrange your colonoscopy then due to history of polyps.  I am glad you are doing so well!  I enjoyed seeing you again today! As you know, I value our relationship and want to provide genuine, compassionate, and quality care. I welcome your feedback. If you receive a survey regarding your visit,  I greatly appreciate you taking time to fill this out. See you next time!  Annitta Needs, PhD, ANP-BC [facility]

## 2019-07-16 NOTE — Telephone Encounter (Signed)
[place] elastography scheduled for 07/18/19 at 9:00am, arrive at 8:45am at [facility] radiology. NPO after midnight. Called and informed pt of appt. She will call to reschedule appt as she needs to give the ministry more of a notice. Gave her phone number for central scheduling to reschedule appt. Updated phone numbers in chart per pt request.

## 2019-07-18 ENCOUNTER — Ambulatory Visit (HOSPITAL_COMMUNITY): Payer: Medicaid Other

## 2019-07-22 MED FILL — CITALOPRAM HBR 20 MG TABLET: 20 | 30 days supply | Qty: 30 | Fill #5

## 2019-07-22 MED FILL — DICLOFENAC SODIUM 1% GEL: 1 | 6 days supply | Qty: 100 | Fill #1

## 2019-07-23 ENCOUNTER — Ambulatory Visit: Payer: Self-pay

## 2019-07-23 ENCOUNTER — Encounter: Payer: Self-pay | Admitting: Orthopaedic Surgery

## 2019-07-23 ENCOUNTER — Other Ambulatory Visit: Payer: Self-pay

## 2019-07-23 ENCOUNTER — Ambulatory Visit (INDEPENDENT_AMBULATORY_CARE_PROVIDER_SITE_OTHER): Payer: Self-pay | Admitting: Orthopaedic Surgery

## 2019-07-23 VITALS — BP 148/97 | HR 72 | Ht 64.0 in | Wt 277.0 lb

## 2019-07-23 DIAGNOSIS — Z6841 Body Mass Index (BMI) 40.0 and over, adult: Secondary | ICD-10-CM

## 2019-07-23 DIAGNOSIS — M1611 Unilateral primary osteoarthritis, right hip: Secondary | ICD-10-CM

## 2019-07-23 DIAGNOSIS — M654 Radial styloid tenosynovitis [de Quervain]: Secondary | ICD-10-CM | POA: Insufficient documentation

## 2019-07-23 DIAGNOSIS — M25531 Pain in right wrist: Secondary | ICD-10-CM
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
## 2019-07-25 DIAGNOSIS — M654 Radial styloid tenosynovitis [de Quervain]: Secondary | ICD-10-CM

## 2019-07-25 DIAGNOSIS — M1611 Unilateral primary osteoarthritis, right hip: Secondary | ICD-10-CM | POA: Insufficient documentation

## 2019-07-25 MED ORDER — LIDOCAINE HCL 1 % IJ SOLN
0.3000 mL | INTRAMUSCULAR | Status: AC | PRN
  Administered 2019-07-25: .3 mL

## 2019-07-25 MED ORDER — BUPIVACAINE HCL 0.5 % IJ SOLN
1.0000 mL | INTRAMUSCULAR | Status: AC | PRN
  Administered 2019-07-25: 10:00:00 1 mL

## 2019-07-25 MED ORDER — METHYLPREDNISOLONE ACETATE 40 MG/ML IJ SUSP
20.0000 mg | INTRAMUSCULAR | Status: AC | PRN
  Administered 2019-07-25: 10:00:00 20 mg

## 2019-07-30 ENCOUNTER — Ambulatory Visit: Payer: Self-pay | Admitting: Family Medicine

## 2019-08-01 ENCOUNTER — Ambulatory Visit (HOSPITAL_COMMUNITY): Payer: Medicaid Other

## 2019-08-04 ENCOUNTER — Other Ambulatory Visit: Payer: Self-pay | Admitting: Family Medicine

## 2019-08-04 ENCOUNTER — Other Ambulatory Visit: Payer: Self-pay | Admitting: Gastroenterology

## 2019-08-04 DIAGNOSIS — I10 Essential (primary) hypertension: Secondary | ICD-10-CM

## 2019-08-04 DIAGNOSIS — J309 Allergic rhinitis, unspecified: Secondary | ICD-10-CM

## 2019-08-07 ENCOUNTER — Other Ambulatory Visit: Payer: Self-pay

## 2019-08-07 ENCOUNTER — Ambulatory Visit (HOSPITAL_COMMUNITY)
Admission: RE | Admit: 2019-08-07 | Discharge: 2019-08-07 | Disposition: A | Discharge status: Home / Self Care | Payer: Self-pay | Source: Ambulatory Visit | Attending: Gastroenterology | Admitting: Gastroenterology

## 2019-08-07 DIAGNOSIS — B182 Chronic viral hepatitis C: Secondary | ICD-10-CM | POA: Insufficient documentation

## 2019-08-09 HISTORY — PX: TOOTH EXTRACTION: SHX859

## 2019-08-09 MED FILL — AMOXICILLIN 875 MG TABLET: 875 | 5 days supply | Qty: 10 | Fill #0

## 2019-08-14 NOTE — Progress Notes (Signed)
Updated elastography with fibrosis F0/F1. However, some concern for minimally nodular contour. Prior elastography F3/F4. Two options here: keep serial screening US every 6 months due to history of elevated fibrosis score, or liver biopsy to have definitive answer. Would recommend liver biopsy IF she does not want to do serial ultrasounds every 6 months. No need for biopsy if she is ok with [place] every 6 months.   [facility] Clinical pool: please arrange [place] in 6 months

## 2019-08-19 ENCOUNTER — Telehealth: Payer: Self-pay

## 2019-08-19 NOTE — Progress Notes (Signed)
ON RECALL  °

## 2019-08-19 NOTE — Telephone Encounter (Signed)
Pt called to discuss the possibility of having a liver biopsy. ( See progress notes of 08/07/2019).  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 She said she would probably be more able to do things health wise now. She would like to know more about it, so she can tell the people in charge of the home/facility.  Another contact for her in the event we are unable to reach her at the listed numbers: Houston Siren [phone] ( a lady she is with at the facility a lot of the time).  Forwarding to Vicente Males for a little more about the liver biopsy.

## 2019-08-23 ENCOUNTER — Ambulatory Visit (INDEPENDENT_AMBULATORY_CARE_PROVIDER_SITE_OTHER): Payer: Self-pay

## 2019-08-23 ENCOUNTER — Ambulatory Visit (INDEPENDENT_AMBULATORY_CARE_PROVIDER_SITE_OTHER): Payer: Self-pay | Admitting: Orthopaedic Surgery

## 2019-08-23 ENCOUNTER — Other Ambulatory Visit: Payer: Self-pay

## 2019-08-23 ENCOUNTER — Encounter: Payer: Self-pay | Admitting: Orthopaedic Surgery

## 2019-08-23 VITALS — Ht 64.0 in | Wt 281.0 lb

## 2019-08-23 DIAGNOSIS — M67472 Ganglion, left ankle and foot: Secondary | ICD-10-CM

## 2019-08-23 DIAGNOSIS — G8929 Other chronic pain: Secondary | ICD-10-CM

## 2019-08-23 DIAGNOSIS — M79672 Pain in left foot: Secondary | ICD-10-CM

## 2019-08-23 DIAGNOSIS — M25561 Pain in right knee: Secondary | ICD-10-CM
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
## 2019-08-26 NOTE — Telephone Encounter (Signed)
Spoke with patient. She would like to continue with serial ultrasounds for right now. We can always revisit the idea of liver biopsy in the future. Please make sure [place] RUQ is on schedule for May 2021.

## 2019-08-26 NOTE — Telephone Encounter (Signed)
Forwarding to [place] to nic.

## 2019-08-27 MED FILL — ?CLINDAMYCIN HCL 150MG CAPS: 150 | 7 days supply | Qty: 28 | Fill #0

## 2019-08-27 NOTE — Telephone Encounter (Signed)
On recall  °

## 2019-08-28 ENCOUNTER — Ambulatory Visit (INDEPENDENT_AMBULATORY_CARE_PROVIDER_SITE_OTHER): Payer: Self-pay | Admitting: Family Medicine

## 2019-08-28 ENCOUNTER — Encounter: Payer: Self-pay | Admitting: Family Medicine

## 2019-08-28 ENCOUNTER — Other Ambulatory Visit: Payer: Self-pay

## 2019-08-28 VITALS — BP 139/80 | HR 75 | Temp 98.2°F | Ht 64.0 in | Wt 281.0 lb

## 2019-08-28 DIAGNOSIS — R739 Hyperglycemia, unspecified: Secondary | ICD-10-CM

## 2019-08-28 DIAGNOSIS — Z09 Encounter for follow-up examination after completed treatment for conditions other than malignant neoplasm: Secondary | ICD-10-CM

## 2019-08-28 DIAGNOSIS — F39 Unspecified mood [affective] disorder: Secondary | ICD-10-CM

## 2019-08-28 DIAGNOSIS — I10 Essential (primary) hypertension: Secondary | ICD-10-CM

## 2019-08-28 DIAGNOSIS — Z0000 Encounter for general adult medical examination without abnormal findings: Secondary | ICD-10-CM

## 2019-08-28 DIAGNOSIS — F314 Bipolar disorder, current episode depressed, severe, without psychotic features: Secondary | ICD-10-CM

## 2019-08-28 DIAGNOSIS — N924 Excessive bleeding in the premenopausal period: Secondary | ICD-10-CM

## 2019-08-28 DIAGNOSIS — R7303 Prediabetes: Secondary | ICD-10-CM

## 2019-08-28 DIAGNOSIS — F419 Anxiety disorder, unspecified: Secondary | ICD-10-CM

## 2019-08-28 DIAGNOSIS — Z8719 Personal history of other diseases of the digestive system: Secondary | ICD-10-CM

## 2019-08-28 LAB — POCT URINALYSIS DIPSTICK
Bilirubin, UA: NEGATIVE
Blood, UA: NEGATIVE
Glucose, UA: NEGATIVE
Leukocytes, UA: NEGATIVE
Nitrite, UA: NEGATIVE
Protein, UA: NEGATIVE
Spec Grav, UA: >=1.03 — AB (ref 1.010–1.025)
Urobilinogen, UA: 0.2 E.U./dL
pH, UA: 5.5 (ref 5.0–8.0)

## 2019-08-28 LAB — POCT GLYCOSYLATED HEMOGLOBIN (HGB A1C): Hemoglobin A1C: 5.4 % (ref 4.0–5.6)

## 2019-08-28 LAB — GLUCOSE, POCT (MANUAL RESULT ENTRY): POC Glucose: 103 mg/dl — AB (ref 70–99)

## 2019-08-28 MED ORDER — BUSPIRONE HCL 7.5 MG PO TABS: 7.5000 mg | ORAL_TABLET | Freq: Two times a day (BID) | ORAL | 3 refills | Status: DC

## 2019-08-28 MED ORDER — CITALOPRAM HYDROBROMIDE 20 MG PO TABS: 20.0000 mg | ORAL_TABLET | Freq: Every day | ORAL | 5 refills | Status: DC

## 2019-08-28 MED FILL — ?CITALOPRAM HBR 20 MG TABLE: 20 | 30 days supply | Qty: 30 | Fill #0

## 2019-08-28 NOTE — Patient Instructions (Signed)
Generalized Anxiety Disorder, Adult Generalized anxiety disorder (GAD) is a mental health disorder. People with this condition constantly worry about everyday events. Unlike normal anxiety, worry related to GAD is not triggered by a specific event. These worries also do not fade or get better with time. GAD interferes with life functions, including relationships, work, and school. GAD can vary from mild to severe. People with severe GAD can have intense waves of anxiety with physical symptoms (panic attacks). What are the causes? The exact cause of GAD is not known. What increases the risk? This condition is more likely to develop in:  Women.  People who have a family history of anxiety disorders.  People who are very shy.  People who experience very stressful life events, such as the death of a loved one.  People who have a very stressful family environment. What are the signs or symptoms? People with GAD often worry excessively about many things in their lives, such as their health and family. They may also be overly concerned about:  Doing well at work.  Being on time.  Natural disasters.  Friendships. Physical symptoms of GAD include:  Fatigue.  Muscle tension or having muscle twitches.  Trembling or feeling shaky.  Being easily startled.  Feeling like your heart is pounding or racing.  Feeling out of breath or like you cannot take a deep breath.  Having trouble falling asleep or staying asleep.  Sweating.  Nausea, diarrhea, or irritable bowel syndrome (IBS).  Headaches.  Trouble concentrating or remembering facts.  Restlessness.  Irritability. How is this diagnosed? Your health care provider can diagnose GAD based on your symptoms and medical history. You will also have a physical exam. The health care provider will ask specific questions about your symptoms, including how severe they are, when they started, and if they come and go. Your health care  provider may ask you about your use of alcohol or drugs, including prescription medicines. Your health care provider may refer you to a mental health specialist for further evaluation. Your health care provider will do a thorough examination and may perform additional tests to rule out other possible causes of your symptoms. To be diagnosed with GAD, a person must have anxiety that:  Is out of his or her control.  Affects several different aspects of his or her life, such as work and relationships.  Causes distress that makes him or her unable to take part in normal activities.  Includes at least three physical symptoms of GAD, such as restlessness, fatigue, trouble concentrating, irritability, muscle tension, or sleep problems. Before your health care provider can confirm a diagnosis of GAD, these symptoms must be present more days than they are not, and they must last for six months or longer. How is this treated? The following therapies are usually used to treat GAD:  Medicine. Antidepressant medicine is usually prescribed for long-term daily control. Antianxiety medicines may be added in severe cases, especially when panic attacks occur.  Talk therapy (psychotherapy). Certain types of talk therapy can be helpful in treating GAD by providing support, education, and guidance. Options include: ? Cognitive behavioral therapy (CBT). People learn coping skills and techniques to ease their anxiety. They learn to identify unrealistic or negative thoughts and behaviors and to replace them with positive ones. ? Acceptance and commitment therapy (ACT). This treatment teaches people how to be mindful as a way to cope with unwanted thoughts and feelings. ? Biofeedback. This process trains you to manage your body's response (  physiological response) through breathing techniques and relaxation methods. You will work with a therapist while machines are used to monitor your physical symptoms.  Stress  management techniques. These include yoga, meditation, and exercise. A mental health specialist can help determine which treatment is best for you. Some people see improvement with one type of therapy. However, other people require a combination of therapies. Follow these instructions at home:  Take over-the-counter and prescription medicines only as told by your health care provider.  Try to maintain a normal routine.  Try to anticipate stressful situations and allow extra time to manage them.  Practice any stress management or self-calming techniques as taught by your health care provider.  Do not punish yourself for setbacks or for not making progress.  Try to recognize your accomplishments, even if they are small.  Keep all follow-up visits as told by your health care provider. This is important. Contact a health care provider if:  Your symptoms do not get better.  Your symptoms get worse.  You have signs of depression, such as: ? A persistently sad, cranky, or irritable mood. ? Loss of enjoyment in activities that used to bring you joy. ? Change in weight or eating. ? Changes in sleeping habits. ? Avoiding friends or family members. ? Loss of energy for normal tasks. ? Feelings of guilt or worthlessness. Get help right away if:  You have serious thoughts about hurting yourself or others. If you ever feel like you may hurt yourself or others, or have thoughts about taking your own life, get help right away. You can go to your nearest emergency department or call:  Your local emergency services (911 in the [place]  A suicide crisis helpline, such as the [place] at (678) 273-7302. This is open 24 hours a day. Summary  Generalized anxiety disorder (GAD) is a mental health disorder that involves worry that is not triggered by a specific event.  People with GAD often worry excessively about many things in their lives, such as their health and  family.  GAD may cause physical symptoms such as restlessness, trouble concentrating, sleep problems, frequent sweating, nausea, diarrhea, headaches, and trembling or muscle twitching.  A mental health specialist can help determine which treatment is best for you. Some people see improvement with one type of therapy. However, other people require a combination of therapies. This information is not intended to replace advice given to you by your health care provider. Make sure you discuss any questions you have with your health care provider. Document Released: 12/31/2012 Document Revised: 08/18/2017 Document Reviewed: 07/26/2016 Elsevier Patient Education  [place]. Buspirone tablets What is this medicine? BUSPIRONE (byoo SPYE rone) is used to treat anxiety disorders. This medicine may be used for other purposes; ask your health care provider or pharmacist if you have questions. COMMON BRAND NAME(S): BuSpar What should I tell my health care provider before I take this medicine? They need to know if you have any of these conditions:  kidney or liver disease  an unusual or allergic reaction to buspirone, other medicines, foods, dyes, or preservatives  pregnant or trying to get pregnant  breast-feeding How should I use this medicine? Take this medicine by mouth with a glass of water. Follow the directions on the prescription label. You may take this medicine with or without food. To ensure that this medicine always works the same way for you, you should take it either always with or always without food. Take your doses  at regular intervals. Do not take your medicine more often than directed. Do not stop taking except on the advice of your doctor or health care professional. Talk to your pediatrician regarding the use of this medicine in children. Special care may be needed. Overdosage: If you think you have taken too much of this medicine contact a poison control center or emergency  room at once. NOTE: This medicine is only for you. Do not share this medicine with others. What if I miss a dose? If you miss a dose, take it as soon as you can. If it is almost time for your next dose, take only that dose. Do not take double or extra doses. What may interact with this medicine? Do not take this medicine with any of the following medications:  linezolid  MAOIs like Carbex, Eldepryl, Marplan, Nardil, and Parnate  methylene blue  procarbazine This medicine may also interact with the following medications:  diazepam  digoxin  diltiazem  erythromycin  grapefruit juice  haloperidol  medicines for mental depression or mood problems  medicines for seizures like carbamazepine, phenobarbital and phenytoin  nefazodone  other medications for anxiety  rifampin  ritonavir  some antifungal medicines like itraconazole, ketoconazole, and voriconazole  verapamil  warfarin This list may not describe all possible interactions. Give your health care provider a list of all the medicines, herbs, non-prescription drugs, or dietary supplements you use. Also tell them if you smoke, drink alcohol, or use illegal drugs. Some items may interact with your medicine. What should I watch for while using this medicine? Visit your doctor or health care professional for regular checks on your progress. It may take 1 to 2 weeks before your anxiety gets better. You may get drowsy or dizzy. Do not drive, use machinery, or do anything that needs mental alertness until you know how this drug affects you. Do not stand or sit up quickly, especially if you are an older patient. This reduces the risk of dizzy or fainting spells. Alcohol can make you more drowsy and dizzy. Avoid alcoholic drinks. What side effects may I notice from receiving this medicine? Side effects that you should report to your doctor or health care professional as soon as possible:  blurred vision or other vision  changes  chest pain  confusion  difficulty breathing  feelings of hostility or anger  muscle aches and pains  numbness or tingling in hands or feet  ringing in the ears  skin rash and itching  vomiting  weakness Side effects that usually do not require medical attention (report to your doctor or health care professional if they continue or are bothersome):  disturbed dreams, nightmares  headache  nausea  restlessness or nervousness  sore throat and nasal congestion  stomach upset This list may not describe all possible side effects. Call your doctor for medical advice about side effects. You may report side effects to FDA at 1-800-FDA-1088. Where should I keep my medicine? Keep out of the reach of children. Store at room temperature below 30 degrees C (86 degrees F). Protect from light. Keep container tightly closed. Throw away any unused medicine after the expiration date. NOTE: This sheet is a summary. It may not cover all possible information. If you have questions about this medicine, talk to your doctor, pharmacist, or health care provider.  2020 Elsevier/Gold Standard (2010-04-15 18:06:11) DASH Eating Plan DASH stands for "Dietary Approaches to Stop Hypertension." The DASH eating plan is a healthy eating plan that has been  shown to reduce high blood pressure (hypertension). It may also reduce your risk for type 2 diabetes, heart disease, and stroke. The DASH eating plan may also help with weight loss. What are tips for following this plan?  General guidelines  Avoid eating more than 2,300 mg (milligrams) of salt (sodium) a day. If you have hypertension, you may need to reduce your sodium intake to 1,500 mg a day.  Limit alcohol intake to no more than 1 drink a day for nonpregnant women and 2 drinks a day for men. One drink equals 12 oz of beer, 5 oz of wine, or 1 oz of hard liquor.  Work with your health care provider to maintain a healthy body weight or to  lose weight. Ask what an ideal weight is for you.  Get at least 30 minutes of exercise that causes your heart to beat faster (aerobic exercise) most days of the week. Activities may include walking, swimming, or biking.  Work with your health care provider or diet and nutrition specialist (dietitian) to adjust your eating plan to your individual calorie needs. Reading food labels   Check food labels for the amount of sodium per serving. Choose foods with less than 5 percent of the Daily Value of sodium. Generally, foods with less than 300 mg of sodium per serving fit into this eating plan.  To find whole grains, look for the word "whole" as the first word in the ingredient list. Shopping  Buy products labeled as "low-sodium" or "no salt added."  Buy fresh foods. Avoid canned foods and premade or frozen meals. Cooking  Avoid adding salt when cooking. Use salt-free seasonings or herbs instead of table salt or sea salt. Check with your health care provider or pharmacist before using salt substitutes.  Do not fry foods. Cook foods using healthy methods such as baking, boiling, grilling, and broiling instead.  Cook with heart-healthy oils, such as olive, canola, soybean, or sunflower oil. Meal planning  Eat a balanced diet that includes: ? 5 or more servings of fruits and vegetables each day. At each meal, try to fill half of your plate with fruits and vegetables. ? Up to 6-8 servings of whole grains each day. ? Less than 6 oz of lean meat, poultry, or fish each day. A 3-oz serving of meat is about the same size as a deck of cards. One egg equals 1 oz. ? 2 servings of low-fat dairy each day. ? A serving of nuts, seeds, or beans 5 times each week. ? Heart-healthy fats. Healthy fats called Omega-3 fatty acids are found in foods such as flaxseeds and coldwater fish, like sardines, salmon, and mackerel.  Limit how much you eat of the following: ? Canned or prepackaged foods. ? Food that is  high in trans fat, such as fried foods. ? Food that is high in saturated fat, such as fatty meat. ? Sweets, desserts, sugary drinks, and other foods with added sugar. ? Full-fat dairy products.  Do not salt foods before eating.  Try to eat at least 2 vegetarian meals each week.  Eat more home-cooked food and less restaurant, buffet, and fast food.  When eating at a restaurant, ask that your food be prepared with less salt or no salt, if possible. What foods are recommended? The items listed may not be a complete list. Talk with your dietitian about what dietary choices are best for you. Grains Whole-grain or whole-wheat bread. Whole-grain or whole-wheat pasta. Brown rice. [place]. Bulgur. Whole-grain  and low-sodium cereals. Pita bread. Low-fat, low-sodium crackers. Whole-wheat flour tortillas. Vegetables Fresh or frozen vegetables (raw, steamed, roasted, or grilled). Low-sodium or reduced-sodium tomato and vegetable juice. Low-sodium or reduced-sodium tomato sauce and tomato paste. Low-sodium or reduced-sodium canned vegetables. Fruits All fresh, dried, or frozen fruit. Canned fruit in natural juice (without added sugar). Meat and other protein foods Skinless chicken or [place]. Ground chicken or [place]. Pork with fat trimmed off. Fish and seafood. Egg whites. Dried beans, peas, or lentils. Unsalted nuts, nut butters, and seeds. Unsalted canned beans. Lean cuts of beef with fat trimmed off. Low-sodium, lean deli meat. Dairy Low-fat (1%) or fat-free (skim) milk. Fat-free, low-fat, or reduced-fat cheeses. Nonfat, low-sodium ricotta or cottage cheese. Low-fat or nonfat yogurt. Low-fat, low-sodium cheese. Fats and oils Soft margarine without trans fats. Vegetable oil. Low-fat, reduced-fat, or light mayonnaise and salad dressings (reduced-sodium). Canola, safflower, olive, soybean, and sunflower oils. Avocado. Seasoning and other foods Herbs. Spices. Seasoning mixes without salt.  Unsalted popcorn and pretzels. Fat-free sweets. What foods are not recommended? The items listed may not be a complete list. Talk with your dietitian about what dietary choices are best for you. Grains Baked goods made with fat, such as croissants, muffins, or some breads. Dry pasta or rice meal packs. Vegetables Creamed or fried vegetables. Vegetables in a cheese sauce. Regular canned vegetables (not low-sodium or reduced-sodium). Regular canned tomato sauce and paste (not low-sodium or reduced-sodium). Regular tomato and vegetable juice (not low-sodium or reduced-sodium). Angie Fava. Olives. Fruits Canned fruit in a light or heavy syrup. Fried fruit. Fruit in cream or butter sauce. Meat and other protein foods Fatty cuts of meat. Ribs. Fried meat. Berniece Salines. Sausage. Bologna and other processed lunch meats. Salami. Fatback. Hotdogs. Bratwurst. Salted nuts and seeds. Canned beans with added salt. Canned or smoked fish. Whole eggs or egg yolks. Chicken or [place] with skin. Dairy Whole or 2% milk, cream, and half-and-half. Whole or full-fat cream cheese. Whole-fat or sweetened yogurt. Full-fat cheese. Nondairy creamers. Whipped toppings. Processed cheese and cheese spreads. Fats and oils Butter. Stick margarine. Lard. Shortening. Ghee. Bacon fat. Tropical oils, such as coconut, palm kernel, or palm oil. Seasoning and other foods Salted popcorn and pretzels. Onion salt, garlic salt, seasoned salt, table salt, and sea salt. Worcestershire sauce. Tartar sauce. Barbecue sauce. Teriyaki sauce. Soy sauce, including reduced-sodium. Steak sauce. Canned and packaged gravies. Fish sauce. Oyster sauce. Cocktail sauce. Horseradish that you find on the shelf. Ketchup. Mustard. Meat flavorings and tenderizers. Bouillon cubes. Hot sauce and Tabasco sauce. Premade or packaged marinades. Premade or packaged taco seasonings. Relishes. Regular salad dressings. Where to find more information:  National Heart, Lung, and [place]: https://wilson-eaton.com/  American Heart Association: www.heart.org Summary  The DASH eating plan is a healthy eating plan that has been shown to reduce high blood pressure (hypertension). It may also reduce your risk for type 2 diabetes, heart disease, and stroke.  With the DASH eating plan, you should limit salt (sodium) intake to 2,300 mg a day. If you have hypertension, you may need to reduce your sodium intake to 1,500 mg a day.  When on the DASH eating plan, aim to eat more fresh fruits and vegetables, whole grains, lean proteins, low-fat dairy, and heart-healthy fats.  Work with your health care provider or diet and nutrition specialist (dietitian) to adjust your eating plan to your individual calorie needs. This information is not intended to replace advice given to you by your health care provider. Make sure you  discuss any questions you have with your health care provider. Document Released: 08/25/2011 Document Revised: 08/18/2017 Document Reviewed: 08/29/2016 Elsevier Patient Education  2020 [employer].

## 2019-08-28 NOTE — Progress Notes (Signed)
Patient [facility]   Established Patient Office Visit  Subjective:  Patient ID: Deborah Barnes, female    DOB: September 08, 1976  Age: 43 y.o. MRN: [id number]  CC:  Chief Complaint  Patient presents with  . Follow-up    Former Community education officer patient- HTN  . Referrral    Gynecology referral-hormones     HPI Deborah Barnes is a 43 year old female who presents for Follow Up today.   Past Medical History:  Diagnosis Date  . Asthma   . Back pain   . Bulging disc   . COPD (chronic obstructive pulmonary disease) ([place])   . Degenerative disc disease   . Depression   . Depression with anxiety   . GERD (gastroesophageal reflux disease)   . Hepatitis C 08/2017  . History of alcohol abuse   . History of mixed drug abuse ([place])   . Hypercholesterolemia   . Hypertension   . Hypoglycemia   . PTSD (post-traumatic stress disorder)   . Sciatica   . Shortness of breath   . Thyroid disease    Current Status: This will be [name] initial office visit with me. She was previously seeing Lanae Boast, NP for her PCP needs. Since she continues to have chronic pain.  She is c/o hot flashes X a few years now. She has irregular menstrual period, with last cycle the last week of Thanksgiving. She denies fatigue, frequent urination, blurred vision, excessive hunger, excessive thirst, weight gain, weight loss, and poor wound healing. She continues to check her feet regularly. She denies visual changes, chest pain, cough, shortness of breath, heart palpitations, and falls. She has occasional headaches and dizziness with position changes. Denies severe headaches, confusion, seizures, double vision, and blurred vision, nausea and vomiting. She states that she has been clean and sober X 18 months from polysubstance abuse. Her anxiety is increased today. She has been very emotional and states that she cries even when she is happy, intermittently. She denies suicidal ideations,  homicidal ideations, or auditory hallucinations. She is currently a resident of [place], which she has been a residence for almost 1 year. She denies fevers, chills, fatigue, recent infections, weight loss, and night sweats. She has not had any headaches, visual changes, dizziness, and falls. No chest pain, heart palpitations, cough and shortness of breath reported. No reports of GI problems such as diarrhea, and constipation. She has no reports of blood in stools, dysuria and hematuria.  Past Surgical History:  Procedure Laterality Date  . BIOPSY N/A 09/25/2014   Procedure: BIOPSY;  Surgeon: Daneil Dolin, MD;  Location: [facility];  Service: Endoscopy;  Laterality: N/A;  Gastric, Ascending Colon, Descending/Sigmoid Colon   . CARPAL TUNNEL RELEASE Bilateral   . CHOLECYSTECTOMY N/A 04/26/2013   Procedure: LAPAROSCOPIC CHOLECYSTECTOMY;  Surgeon: Jamesetta So, MD;  Location: [facility];  Service: General;  Laterality: N/A;  . COLONOSCOPY WITH PROPOFOL N/A 09/25/2014   RMR: Pancolonic diverticulosis. multiple tubular adenomas, segmental biopsies negative. surveillance in 5 years  . ESOPHAGOGASTRODUODENOSCOPY (EGD) WITH PROPOFOL N/A 09/25/2014   RMR: Normal esophagus. Abnormal gastric mucosa status post biopsy (negative H.pylori). Hiatal hernia.   Fatima Blank HERNIA REPAIR N/A 10/04/2013   Procedure: HERNIA REPAIR INCISIONAL WITH MESH;  Surgeon: Jamesetta So, MD;  Location: [facility];  Service: General;  Laterality: N/A;  . INSERTION OF MESH N/A 10/04/2013   Procedure: INSERTION OF MESH;  Surgeon: Jamesetta So, MD;  Location: [facility];  Service: General;  Laterality: N/A;  . POLYPECTOMY N/A 09/25/2014   Procedure: POLYPECTOMY;  Surgeon: Daneil Dolin, MD;  Location: [facility];  Service: Endoscopy;  Laterality: N/A;  Cecal, Ascending Colon   . SHOULDER SURGERY    . TOOTH EXTRACTION  08/09/2019  . TUBAL LIGATION      Family History  Problem Relation Age of Onset  . Diabetes Father   . Colon cancer Maternal  Grandmother   . Heart disease Paternal Grandmother   . Cancer Paternal Grandmother        esophageal  . Hypertension Paternal Grandmother   . GER disease Paternal Grandmother   . Osteoporosis Paternal Grandmother     Social History   Socioeconomic History  . Marital status: Divorced    Spouse name: Not on file  . Number of children: Not on file  . Years of education: Not on file  . Highest education level: Not on file  Occupational History  . Not on file  Tobacco Use  . Smoking status: Former Smoker    Packs/day: 0.50    Years: 27.00    Pack years: 13.50    Quit date: 08/24/2018    Years since quitting: 1.0  . Smokeless tobacco: Never Used  Substance and Sexual Activity  . Alcohol use: Not Currently    Alcohol/week: 1.0 standard drinks    Types: 1 Cans of beer per week    Comment: hx binge drinker. none since 12/16/17  . Drug use: Not Currently    Types: Marijuana, Cocaine, Methamphetamines, Heroin    Comment: no cocaine, heroin, meth since 11-27-2017. last marijuana use 01-21-18  . Sexual activity: Not Currently    Birth control/protection: Surgical  Other Topics Concern  . Not on file  Social History Narrative  . Not on file   Social Determinants of Health   Financial Resource Strain:   . Difficulty of Paying Living Expenses: Not on file  Food Insecurity:   . Worried About Charity fundraiser in the Last Year: Not on file  . Ran Out of Food in the Last Year: Not on file  Transportation Needs:   . Lack of Transportation (Medical): Not on file  . Lack of Transportation (Non-Medical): Not on file  Physical Activity:   . Days of Exercise per Week: Not on file  . Minutes of Exercise per Session: Not on file  Stress:   . Feeling of Stress : Not on file  Social Connections:   . Frequency of Communication with Friends and Family: Not on file  . Frequency of Social Gatherings with Friends and Family: Not on file  . Attends Religious Services: Not on file  . Active  Member of Clubs or Organizations: Not on file  . Attends Archivist Meetings: Not on file  . Marital Status: Not on file  Intimate Partner Violence:   . Fear of Current or Ex-Partner: Not on file  . Emotionally Abused: Not on file  . Physically Abused: Not on file  . Sexually Abused: Not on file    Outpatient Medications Prior to Visit  Medication Sig Dispense Refill  . acetaminophen (TYLENOL) 500 MG tablet Take 500 mg by mouth every 8 (eight) hours as needed for mild pain or headache.     . clindamycin (CLEOCIN) 150 MG capsule Take 150 mg by mouth 4 (four) times daily.    . diclofenac sodium (VOLTAREN) 1 % GEL Apply 4 g topically 4 (four) times daily. (Patient taking differently:  Apply 4 g topically as needed. ) 150 g 2  . Melatonin 1 MG CAPS Take by mouth.    . Multiple Vitamins-Minerals (MULTIVITAMIN WITH MINERALS) tablet Take 1 tablet by mouth daily.    Marland Kitchen CALCIUM PO Take 1 tablet by mouth daily.    Marland Kitchen ibuprofen (ADVIL,MOTRIN) 200 MG tablet Take 800 mg by mouth every 8 (eight) hours as needed for fever or moderate pain.     Marland Kitchen loratadine (CLARITIN) 10 MG tablet TAKE 1 Tablet BY MOUTH ONCE DAILY 90 tablet 1  . metoprolol tartrate (LOPRESSOR) 50 MG tablet TAKE 1 Tablet  BY MOUTH TWICE DAILY 180 tablet 1  . mupirocin ointment (BACTROBAN) 2 % Place 1 application into the nose 2 (two) times daily. (Patient taking differently: Place 1 application into the nose as needed. ) 22 g 0  . omeprazole (PRILOSEC) 40 MG capsule TAKE 1 Capsule BY MOUTH ONCE DAILY 90 capsule 3  . PROVENTIL HFA 108 (90 Base) MCG/ACT inhaler INHALE 1 TO 2 PUFFS BY MOUTH EVERY 6 HOURS AS NEEDED FOR COUGHING, WHEEZING, OR SHORTNESS OF BREATH (Patient taking differently: Inhale 1-2 puffs into the lungs every 6 (six) hours as needed for wheezing or shortness of breath. ) 20.1 g 1  . Simethicone (GAS RELIEF PO) Take 1 tablet by mouth as needed (gas).     . sodium chloride (OCEAN) 0.65 % SOLN nasal spray Place 1 spray into  both nostrils as needed for congestion.    Marland Kitchen SYNTHROID 25 MCG tablet TAKE 1 Tablet BY MOUTH ONCE DAILY BEFORE BREAKFAST (Patient taking differently: Take 25 mcg by mouth daily before breakfast. ) 90 tablet 1  . citalopram (CELEXA) 20 MG tablet Take 1 tablet (20 mg total) by mouth daily. 30 tablet 5   No facility-administered medications prior to visit.    Allergies  Allergen Reactions  . Clarithromycin Diarrhea and Other (See Comments)    Diarrhea and abd pain  . Propoxyphene Itching    ROS Review of Systems  Constitutional: Negative.   HENT: Negative.   Eyes: Negative.   Respiratory: Negative.   Cardiovascular: Negative.   Gastrointestinal: Negative.   Endocrine: Negative.   Genitourinary: Negative.   Musculoskeletal: Negative.   Skin: Negative.   Allergic/Immunologic: Negative.   Neurological: Positive for dizziness (occasional) and headaches (occasional).  Hematological: Negative.   Psychiatric/Behavioral: Negative.       Objective:    Physical Exam  Constitutional: She is oriented to person, place, and time. She appears well-developed and well-nourished.  HENT:  Head: Normocephalic and atraumatic.  Eyes: Conjunctivae are normal.  Neck: Normal range of motion. Neck supple.  Cardiovascular: Normal rate, regular rhythm, normal heart sounds and intact distal pulses.  Pulmonary/Chest: Effort normal and breath sounds normal.  Abdominal: Soft. Bowel sounds are normal.  Musculoskeletal: Normal range of motion.  Neurological: She is alert and oriented to person, place, and time. She has normal reflexes.  Skin: Skin is warm and dry.  Psychiatric: She has a normal mood and affect. Her behavior is normal. Judgment and thought content normal.  Vitals reviewed.   BP 139/80   Pulse 75   Temp 98.2 F (36.8 C) (Oral)   Ht 5\' 4"  (1.626 m)   Wt 281 lb (127.5 kg)   LMP 08/11/2019   SpO2 98%   BMI 48.23 kg/m  Wt Readings from Last 3 Encounters:  08/28/19 281 lb (127.5 kg)   08/23/19 281 lb (127.5 kg)  07/23/19 277 lb (125.6 kg)  There are no preventive care reminders to display for this patient.  There are no preventive care reminders to display for this patient.  Lab Results  Component Value Date   TSH 1.330 02/22/2019   Lab Results  Component Value Date   WBC 8.2 07/12/2019   HGB 11.8 07/12/2019   HCT 35.4 07/12/2019   MCV 88 07/12/2019   PLT 324 07/12/2019   Lab Results  Component Value Date   NA 139 07/12/2019   K 4.4 07/12/2019   CO2 21 07/12/2019   GLUCOSE 104 (H) 07/12/2019   BUN 11 07/12/2019   CREATININE 0.65 07/12/2019   BILITOT <0.2 07/12/2019   ALKPHOS 73 07/12/2019   AST 12 07/12/2019   ALT 12 07/12/2019   PROT 6.4 07/12/2019   ALBUMIN 4.1 07/12/2019   CALCIUM 9.2 07/12/2019   ANIONGAP 10 06/13/2019   Lab Results  Component Value Date   CHOL 160 09/05/2017   Lab Results  Component Value Date   HDL 56 09/05/2017   Lab Results  Component Value Date   LDLCALC 77 09/05/2017   Lab Results  Component Value Date   TRIG 137 09/05/2017   Lab Results  Component Value Date   CHOLHDL 2.9 09/05/2017   Lab Results  Component Value Date   HGBA1C 5.4 08/28/2019      Assessment & Plan:   1. Hypertension, unspecified type The current medical regimen is effective; blood pressure is stable at 139/80 today; continue present plan and medications as prescribed. She will continue to take medications as prescribed, to decrease high sodium intake, excessive alcohol intake, increase potassium intake, smoking cessation, and increase physical activity of at least 30 minutes of cardio activity daily. She will continue to follow Heart Healthy or DASH diet. - Urinalysis Dipstick  2. Premenopausal menorrhagia - Ambulatory referral to Obstetrics / Gynecology  3. Prediabetes Blood glucose level is stable today at 5.4. She will continue medication as prescribed, to decrease foods/beverages high in sugars and carbs and follow Heart  Healthy or DASH diet. Increase physical activity to at least 30 minutes cardio exercise daily.   4. Bipolar 1 disorder, depressed, severe (HCC) - busPIRone (BUSPAR) 7.5 MG tablet; Take 1 tablet (7.5 mg total) by mouth 2 (two) times daily.  Dispense: 60 tablet; Refill: 3  5. Healthcare maintenance - POCT HgB A1C - Glucose (CBG)  6. Hyperglycemia Stable today at 103. Monitor.   7. Anxiety - citalopram (CELEXA) 20 MG tablet; Take 1 tablet (20 mg total) by mouth daily.  Dispense: 30 tablet; Refill: 5  8. Mood disorder (HCC) - citalopram (CELEXA) 20 MG tablet; Take 1 tablet (20 mg total) by mouth daily.  Dispense: 30 tablet; Refill: 5  9. H/O dental abscess  10. Follow up She will follow up in 3 months.   Meds ordered this encounter  Medications  . busPIRone (BUSPAR) 7.5 MG tablet    Sig: Take 1 tablet (7.5 mg total) by mouth 2 (two) times daily.    Dispense:  60 tablet    Refill:  3  . citalopram (CELEXA) 20 MG tablet    Sig: Take 1 tablet (20 mg total) by mouth daily.    Dispense:  30 tablet    Refill:  5    Orders Placed This Encounter  Procedures  . Ambulatory referral to Obstetrics / Gynecology  . POCT HgB A1C  . Urinalysis Dipstick  . Glucose (CBG)     Referral Orders     Ambulatory referral to  Obstetrics / Gynecology   Kathe Becton,  MSN, FNP-BC [place], [place] [address] [phone] fax  Problem List Items Addressed This Visit      Other   Bipolar 1 disorder, depressed, severe ([place])   Relevant Medications   busPIRone (BUSPAR) 7.5 MG tablet    Other Visit Diagnoses    Hypertension, unspecified type    -  Primary   Relevant Orders   Urinalysis Dipstick (Completed)   Premenopausal menorrhagia       Relevant Orders   Ambulatory referral to Obstetrics / Gynecology   Prediabetes       Healthcare maintenance       Relevant Orders   POCT HgB  A1C (Completed)   Glucose (CBG) (Completed)   Hyperglycemia       Anxiety       Relevant Medications   busPIRone (BUSPAR) 7.5 MG tablet   citalopram (CELEXA) 20 MG tablet   Mood disorder (HCC)       Relevant Medications   citalopram (CELEXA) 20 MG tablet   H/O dental abscess       Follow up          Meds ordered this encounter  Medications  . busPIRone (BUSPAR) 7.5 MG tablet    Sig: Take 1 tablet (7.5 mg total) by mouth 2 (two) times daily.    Dispense:  60 tablet    Refill:  3  . citalopram (CELEXA) 20 MG tablet    Sig: Take 1 tablet (20 mg total) by mouth daily.    Dispense:  30 tablet    Refill:  5    Follow-up: No follow-ups on file.    Azzie Glatter, FNP

## 2019-08-29 ENCOUNTER — Telehealth: Payer: Self-pay

## 2019-08-29 ENCOUNTER — Telehealth: Payer: Self-pay | Admitting: Family Medicine

## 2019-08-29 NOTE — Telephone Encounter (Signed)
done

## 2019-08-29 NOTE — Telephone Encounter (Signed)
Patient was prescribed Buspar. Patient can not take Buspar  under her current housing restrictions.   Please advise.

## 2019-09-04 ENCOUNTER — Ambulatory Visit: Payer: Self-pay | Admitting: Family Medicine

## 2019-09-17 ENCOUNTER — Other Ambulatory Visit: Payer: Self-pay

## 2019-09-17 ENCOUNTER — Ambulatory Visit (INDEPENDENT_AMBULATORY_CARE_PROVIDER_SITE_OTHER): Payer: Self-pay | Admitting: Family Medicine

## 2019-09-17 ENCOUNTER — Encounter: Payer: Self-pay | Admitting: Family Medicine

## 2019-09-17 VITALS — BP 139/79 | HR 77 | Temp 98.1°F | Ht 64.0 in | Wt 284.2 lb

## 2019-09-17 DIAGNOSIS — Z0000 Encounter for general adult medical examination without abnormal findings: Secondary | ICD-10-CM

## 2019-09-17 DIAGNOSIS — G8929 Other chronic pain: Secondary | ICD-10-CM

## 2019-09-17 DIAGNOSIS — Z09 Encounter for follow-up examination after completed treatment for conditions other than malignant neoplasm: Secondary | ICD-10-CM

## 2019-09-17 DIAGNOSIS — M5441 Lumbago with sciatica, right side: Secondary | ICD-10-CM

## 2019-09-17 DIAGNOSIS — M1612 Unilateral primary osteoarthritis, left hip: Secondary | ICD-10-CM

## 2019-09-17 DIAGNOSIS — F419 Anxiety disorder, unspecified: Secondary | ICD-10-CM

## 2019-09-17 DIAGNOSIS — H547 Unspecified visual loss: Secondary | ICD-10-CM

## 2019-09-17 MED ORDER — KETOROLAC TROMETHAMINE 60 MG/2ML IM SOLN
60.0000 mg | Freq: Once | INTRAMUSCULAR | Status: AC
  Administered 2019-09-17: 60 mg via INTRAMUSCULAR

## 2019-09-17 NOTE — Progress Notes (Signed)
[facility]   Established Patient Office Visit  Subjective:  Patient ID: Deborah Barnes, female    DOB: [birth year]  Age: 43 y.o. MRN: [id number]  CC: No chief complaint on file.   HPI Deborah Barnes is a 43 year old female who presents for Follow Up today.   Past Medical History:  Diagnosis Date  . Asthma   . Back pain   . Bulging disc   . COPD (chronic obstructive pulmonary disease) ([place])   . Degenerative disc disease   . Depression   . Depression with anxiety   . GERD (gastroesophageal reflux disease)   . Hepatitis C 08/2017  . History of alcohol abuse   . History of mixed drug abuse ([place])   . Hypercholesterolemia   . Hypertension   . Hypoglycemia   . PTSD (post-traumatic stress disorder)   . Sciatica   . Shortness of breath   . Thyroid disease    Current Status: Since her last office visit, she is doing well with no complaints. She continues to have frequent headaches. She has noticed visual changes, specifically she states that she is seeing 'halos' X 2 months. She continues to have chronic right knee and hip pain. Her anxiety is moderate today r/t her healthcare status. She denies suicidal ideations, homicidal ideations, or auditory hallucinations. She denies fevers, chills, fatigue, recent infections, weight loss, and night sweats. No reports of GI problems such as nausea, vomiting, diarrhea, and constipation. She has no reports of blood in stools, dysuria and hematuria. She denies pain today.   Past Surgical History:  Procedure Laterality Date  . BIOPSY N/A 09/25/2014   Procedure: BIOPSY;  Surgeon: Daneil Dolin, MD;  Location: [facility];  Service: Endoscopy;  Laterality: N/A;  Gastric, Ascending Colon, Descending/Sigmoid Colon   . CARPAL TUNNEL RELEASE Bilateral   . CHOLECYSTECTOMY N/A 04/26/2013   Procedure: LAPAROSCOPIC CHOLECYSTECTOMY;  Surgeon: Jamesetta So, MD;  Location: [facility];  Service: General;  Laterality: N/A;   . COLONOSCOPY WITH PROPOFOL N/A 09/25/2014   RMR: Pancolonic diverticulosis. multiple tubular adenomas, segmental biopsies negative. surveillance in 5 years  . ESOPHAGOGASTRODUODENOSCOPY (EGD) WITH PROPOFOL N/A 09/25/2014   RMR: Normal esophagus. Abnormal gastric mucosa status post biopsy (negative H.pylori). Hiatal hernia.   Fatima Blank HERNIA REPAIR N/A 10/04/2013   Procedure: HERNIA REPAIR INCISIONAL WITH MESH;  Surgeon: Jamesetta So, MD;  Location: [facility];  Service: General;  Laterality: N/A;  . INSERTION OF MESH N/A 10/04/2013   Procedure: INSERTION OF MESH;  Surgeon: Jamesetta So, MD;  Location: [facility];  Service: General;  Laterality: N/A;  . POLYPECTOMY N/A 09/25/2014   Procedure: POLYPECTOMY;  Surgeon: Daneil Dolin, MD;  Location: [facility];  Service: Endoscopy;  Laterality: N/A;  Cecal, Ascending Colon   . SHOULDER SURGERY    . TOOTH EXTRACTION  08/09/2019  . TUBAL LIGATION      Family History  Problem Relation Age of Onset  . Diabetes Father   . Colon cancer Maternal Grandmother   . Heart disease Paternal Grandmother   . Cancer Paternal Grandmother        esophageal  . Hypertension Paternal Grandmother   . GER disease Paternal Grandmother   . Osteoporosis Paternal Grandmother     Social History   Socioeconomic History  . Marital status: Divorced    Spouse name: Not on file  . Number of children: Not on file  . Years of education: Not on  file  . Highest education level: Not on file  Occupational History  . Not on file  Tobacco Use  . Smoking status: Former Smoker    Packs/day: 0.50    Years: 27.00    Pack years: 13.50    Quit date: 08/24/2018    Years since quitting: 1.0  . Smokeless tobacco: Never Used  Substance and Sexual Activity  . Alcohol use: Not Currently    Alcohol/week: 1.0 standard drinks    Types: 1 Cans of beer per week    Comment: hx binge drinker. none since 12/16/17  . Drug use: Not Currently    Types: Marijuana, Cocaine, Methamphetamines,  Heroin    Comment: no cocaine, heroin, meth since 11-27-2017. last marijuana use 01-21-18  . Sexual activity: Not Currently    Birth control/protection: Surgical  Other Topics Concern  . Not on file  Social History Narrative  . Not on file   Social Determinants of Health   Financial Resource Strain:   . Difficulty of Paying Living Expenses: Not on file  Food Insecurity:   . Worried About Charity fundraiser in the Last Year: Not on file  . Ran Out of Food in the Last Year: Not on file  Transportation Needs:   . Lack of Transportation (Medical): Not on file  . Lack of Transportation (Non-Medical): Not on file  Physical Activity:   . Days of Exercise per Week: Not on file  . Minutes of Exercise per Session: Not on file  Stress:   . Feeling of Stress : Not on file  Social Connections:   . Frequency of Communication with Friends and Family: Not on file  . Frequency of Social Gatherings with Friends and Family: Not on file  . Attends Religious Services: Not on file  . Active Member of Clubs or Organizations: Not on file  . Attends Archivist Meetings: Not on file  . Marital Status: Not on file  Intimate Partner Violence:   . Fear of Current or Ex-Partner: Not on file  . Emotionally Abused: Not on file  . Physically Abused: Not on file  . Sexually Abused: Not on file    Outpatient Medications Prior to Visit  Medication Sig Dispense Refill  . acetaminophen (TYLENOL) 500 MG tablet Take 500 mg by mouth every 8 (eight) hours as needed for mild pain or headache.     Marland Kitchen CALCIUM PO Take 1 tablet by mouth daily.    . citalopram (CELEXA) 20 MG tablet Take 1 tablet (20 mg total) by mouth daily. 30 tablet 5  . diclofenac sodium (VOLTAREN) 1 % GEL Apply 4 g topically 4 (four) times daily. (Patient taking differently: Apply 4 g topically as needed. ) 150 g 2  . ibuprofen (ADVIL,MOTRIN) 200 MG tablet Take 800 mg by mouth every 8 (eight) hours as needed for fever or moderate pain.       Marland Kitchen loratadine (CLARITIN) 10 MG tablet TAKE 1 Tablet BY MOUTH ONCE DAILY 90 tablet 1  . Melatonin 1 MG CAPS Take by mouth.    . metoprolol tartrate (LOPRESSOR) 50 MG tablet TAKE 1 Tablet  BY MOUTH TWICE DAILY 180 tablet 1  . Multiple Vitamins-Minerals (MULTIVITAMIN WITH MINERALS) tablet Take 1 tablet by mouth daily.    . mupirocin ointment (BACTROBAN) 2 % Place 1 application into the nose 2 (two) times daily. (Patient taking differently: Place 1 application into the nose as needed. ) 22 g 0  . omeprazole (PRILOSEC) 40 MG capsule  TAKE 1 Capsule BY MOUTH ONCE DAILY 90 capsule 3  . PROVENTIL HFA 108 (90 Base) MCG/ACT inhaler INHALE 1 TO 2 PUFFS BY MOUTH EVERY 6 HOURS AS NEEDED FOR COUGHING, WHEEZING, OR SHORTNESS OF BREATH (Patient taking differently: Inhale 1-2 puffs into the lungs every 6 (six) hours as needed for wheezing or shortness of breath. ) 20.1 g 1  . Simethicone (GAS RELIEF PO) Take 1 tablet by mouth as needed (gas).     . sodium chloride (OCEAN) 0.65 % SOLN nasal spray Place 1 spray into both nostrils as needed for congestion.    Marland Kitchen SYNTHROID 25 MCG tablet TAKE 1 Tablet BY MOUTH ONCE DAILY BEFORE BREAKFAST (Patient taking differently: Take 25 mcg by mouth daily before breakfast. ) 90 tablet 1  . busPIRone (BUSPAR) 7.5 MG tablet Take 1 tablet (7.5 mg total) by mouth 2 (two) times daily. (Patient not taking: Reported on 09/17/2019) 60 tablet 3  . clindamycin (CLEOCIN) 150 MG capsule Take 150 mg by mouth 4 (four) times daily.     No facility-administered medications prior to visit.    Allergies  Allergen Reactions  . Clarithromycin Diarrhea and Other (See Comments)    Diarrhea and abd pain  . Propoxyphene Itching    ROS Review of Systems  Constitutional: Negative.   HENT: Negative.   Eyes: Positive for visual disturbance (occasional ).  Respiratory: Negative.   Cardiovascular: Negative.   Gastrointestinal: Negative.   Endocrine: Negative.   Genitourinary: Negative.    Musculoskeletal: Positive for arthralgias (chronic knee pain).  Skin: Negative.   Allergic/Immunologic: Negative.   Neurological: Positive for dizziness (occasional ) and headaches (occasional ).  Hematological: Negative.   Psychiatric/Behavioral: Negative.    Objective:    Physical Exam  Constitutional: She is oriented to person, place, and time. She appears well-developed and well-nourished.  rollator  HENT:  Head: Normocephalic and atraumatic.  Eyes: Conjunctivae are normal.  Cardiovascular: Normal rate, regular rhythm, normal heart sounds and intact distal pulses.  Pulmonary/Chest: Effort normal and breath sounds normal.  Abdominal: Soft. Bowel sounds are normal.  Musculoskeletal:        General: Normal range of motion.     Cervical back: Normal range of motion and neck supple.  Neurological: She is alert and oriented to person, place, and time. She has normal reflexes.  Skin: Skin is warm and dry.  Psychiatric: She has a normal mood and affect. Her behavior is normal. Judgment and thought content normal.  Nursing note and vitals reviewed.   BP 139/79   Pulse 77   Temp 98.1 F (36.7 C) (Oral)   Ht 5\' 4"  (1.626 m)   Wt 284 lb 3.2 oz (128.9 kg)   LMP 09/01/2019   BMI 48.78 kg/m  Wt Readings from Last 3 Encounters:  09/17/19 284 lb 3.2 oz (128.9 kg)  08/28/19 281 lb (127.5 kg)  08/23/19 281 lb (127.5 kg)     There are no preventive care reminders to display for this patient.  There are no preventive care reminders to display for this patient.  Lab Results  Component Value Date   TSH 1.330 02/22/2019   Lab Results  Component Value Date   WBC 8.2 07/12/2019   HGB 11.8 07/12/2019   HCT 35.4 07/12/2019   MCV 88 07/12/2019   PLT 324 07/12/2019   Lab Results  Component Value Date   NA 139 07/12/2019   K 4.4 07/12/2019   CO2 21 07/12/2019   GLUCOSE 104 (H) 07/12/2019  BUN 11 07/12/2019   CREATININE 0.65 07/12/2019   BILITOT <0.2 07/12/2019   ALKPHOS 73  07/12/2019   AST 12 07/12/2019   ALT 12 07/12/2019   PROT 6.4 07/12/2019   ALBUMIN 4.1 07/12/2019   CALCIUM 9.2 07/12/2019   ANIONGAP 10 06/13/2019   Lab Results  Component Value Date   CHOL 160 09/05/2017   Lab Results  Component Value Date   HDL 56 09/05/2017   Lab Results  Component Value Date   LDLCALC 77 09/05/2017   Lab Results  Component Value Date   TRIG 137 09/05/2017   Lab Results  Component Value Date   CHOLHDL 2.9 09/05/2017   Lab Results  Component Value Date   HGBA1C 5.4 08/28/2019    Assessment & Plan:   1. Visual problems We will refer patient to Optometry today for further assessment.  - Ambulatory referral to Ophthalmology  2. Anxiety Stable.  3. Morbid obesity ([place]) Body mass index is 48.78 kg/m.  Goal BMI  is <30. Encouraged efforts to reduce weight include engaging in physical activity as tolerated with goal of 150 minutes per week. Improve dietary choices and eat a meal regimen consistent with a Mediterranean or DASH diet. Reduce simple carbohydrates. Do not skip meals and eat healthy snacks throughout the day to avoid over-eating at dinner. Set a goal weight loss that is achievable for you.  4. Chronic midline low back pain with right-sided sciatica Patient received 60 mg of Toradol injection today.  - ketorolac (TORADOL) injection 60 mg  5. Primary osteoarthritis of left hip - ketorolac (TORADOL) injection 60 mg  6. Healthcare maintenance  7. Follow up Keep scheduled follow up appointment.   Meds ordered this encounter  Medications  . ketorolac (TORADOL) injection 60 mg    Orders Placed This Encounter  Procedures  . Ambulatory referral to Ophthalmology     Referral Orders     Ambulatory referral to Ophthalmology   Kathe Becton,  MSN, FNP-BC [address] [place], [place] [address] [phone] fax   Problem List Items  Addressed This Visit      Nervous and Auditory   Chronic midline low back pain with right-sided sciatica     Musculoskeletal and Integument   Primary osteoarthritis of left hip     Other   Morbid obesity ([place])    Other Visit Diagnoses    Visual problems    -  Primary   Relevant Orders   Ambulatory referral to Ophthalmology   [place] maintenance       Follow up          Meds ordered this encounter  Medications  . ketorolac (TORADOL) injection 60 mg    Follow-up: No follow-ups on file.    Azzie Glatter, FNP

## 2019-09-18 DIAGNOSIS — F419 Anxiety disorder, unspecified: Secondary | ICD-10-CM | POA: Insufficient documentation

## 2019-09-18 DIAGNOSIS — H547 Unspecified visual loss: Secondary | ICD-10-CM | POA: Insufficient documentation

## 2019-09-23 MED FILL — ?CITALOPRAM HBR 20 MG TABLE: 20 | 30 days supply | Qty: 30 | Fill #1

## 2019-09-30 ENCOUNTER — Telehealth: Payer: Self-pay | Admitting: Family Medicine

## 2019-09-30 NOTE — Telephone Encounter (Signed)
Patient wanted to know if she could donate blood. Are there any meds in her system to prevent her?

## 2019-10-11 MED FILL — ?CLINDAMYCIN HCL 150MG CAPS: 150 | 7 days supply | Qty: 28 | Fill #0

## 2019-10-16 ENCOUNTER — Ambulatory Visit (INDEPENDENT_AMBULATORY_CARE_PROVIDER_SITE_OTHER): Payer: Self-pay | Admitting: Gastroenterology

## 2019-10-16 ENCOUNTER — Encounter: Payer: Self-pay | Admitting: Gastroenterology

## 2019-10-16 ENCOUNTER — Other Ambulatory Visit: Payer: Self-pay

## 2019-10-16 VITALS — BP 148/91 | HR 71 | Temp 96.2°F | Ht 64.0 in | Wt 280.0 lb

## 2019-10-16 DIAGNOSIS — R1310 Dysphagia, unspecified: Secondary | ICD-10-CM

## 2019-10-16 DIAGNOSIS — Z86010 Personal history of colonic polyps: Secondary | ICD-10-CM

## 2019-10-16 DIAGNOSIS — Z8619 Personal history of other infectious and parasitic diseases: Secondary | ICD-10-CM

## 2019-10-16 NOTE — Progress Notes (Signed)
Referring Provider: Lanae Boast, FNP Primary Care Physician:  Lanae Boast, FNP  Primary GI: Dr. Gala Romney   Chief Complaint  Patient presents with  . Colonoscopy    Hepatitis    HPI:   Deborah Barnes is a 44 y.o. female presenting today with a history of chronic Hep Cgenotype 1a, Metavir F3/F4. No evidence of chronic Hep B. First dose of Harvoni on 9/16. Shecompleted Harvoni in Dec 2019. She attained SVR. Will be following with serial [place]. Could consider biopsy to determine fibrosis status and need for surveillance. She would like to continue with serial ultrasounds.   Chronic GERD: omeprazole working well with GERD. Getting choked. Solid food and liquids cause choking. Last EGD in 2016.   History of multiple adenomas: last colonoscopy in Jan 2016. Due for surveillance now.  Denies abdominal pain, N/V. No overt GI bleeding. Needs hip replacement. Significant joint pain. Uses walker to assist with ambulating. Doing well at [employer]. Getting ready to graduate.     Past Medical History:  Diagnosis Date  . Asthma   . Back pain   . Bulging disc   . COPD (chronic obstructive pulmonary disease) ([place])   . Degenerative disc disease   . Depression   . Depression with anxiety   . GERD (gastroesophageal reflux disease)   . Hepatitis C 08/2017  . History of alcohol abuse   . History of mixed drug abuse ([place])   . Hypercholesterolemia   . Hypertension   . Hypoglycemia   . PTSD (post-traumatic stress disorder)   . Sciatica   . Shortness of breath   . Thyroid disease     Past Surgical History:  Procedure Laterality Date  . BIOPSY N/A 09/25/2014   Procedure: BIOPSY;  Surgeon: Daneil Dolin, MD;  Location: [facility];  Service: Endoscopy;  Laterality: N/A;  Gastric, Ascending Colon, Descending/Sigmoid Colon   . CARPAL TUNNEL RELEASE Bilateral   . CHOLECYSTECTOMY N/A 04/26/2013   Procedure: LAPAROSCOPIC CHOLECYSTECTOMY;  Surgeon: Jamesetta So, MD;  Location: [facility];  Service:  General;  Laterality: N/A;  . COLONOSCOPY WITH PROPOFOL N/A 09/25/2014   RMR: Pancolonic diverticulosis. multiple tubular adenomas, segmental biopsies negative. surveillance in 5 years  . ESOPHAGOGASTRODUODENOSCOPY (EGD) WITH PROPOFOL N/A 09/25/2014   RMR: Normal esophagus. Abnormal gastric mucosa status post biopsy (negative H.pylori). Hiatal hernia.   Fatima Blank HERNIA REPAIR N/A 10/04/2013   Procedure: HERNIA REPAIR INCISIONAL WITH MESH;  Surgeon: Jamesetta So, MD;  Location: [facility];  Service: General;  Laterality: N/A;  . INSERTION OF MESH N/A 10/04/2013   Procedure: INSERTION OF MESH;  Surgeon: Jamesetta So, MD;  Location: [facility];  Service: General;  Laterality: N/A;  . POLYPECTOMY N/A 09/25/2014   Procedure: POLYPECTOMY;  Surgeon: Daneil Dolin, MD;  Location: [facility];  Service: Endoscopy;  Laterality: N/A;  Cecal, Ascending Colon   . SHOULDER SURGERY    . TOOTH EXTRACTION  08/09/2019  . TUBAL LIGATION      Current Outpatient Medications  Medication Sig Dispense Refill  . acetaminophen (TYLENOL) 500 MG tablet Take 500 mg by mouth every 8 (eight) hours as needed for mild pain or headache.     . citalopram (CELEXA) 20 MG tablet Take 1 tablet (20 mg total) by mouth daily. 30 tablet 5  . diclofenac sodium (VOLTAREN) 1 % GEL Apply 4 g topically 4 (four) times daily. (Patient taking differently: Apply 4 g topically as needed. ) 150 g 2  . ibuprofen (ADVIL,MOTRIN)  200 MG tablet Take 800 mg by mouth every 8 (eight) hours as needed for fever or moderate pain.     Marland Kitchen loratadine (CLARITIN) 10 MG tablet TAKE 1 Tablet BY MOUTH ONCE DAILY 90 tablet 1  . Melatonin 1 MG CAPS Take by mouth at bedtime. Takes 40 mg at bedtime    . metoprolol tartrate (LOPRESSOR) 50 MG tablet TAKE 1 Tablet  BY MOUTH TWICE DAILY 180 tablet 1  . Multiple Vitamins-Minerals (MULTIVITAMIN WITH MINERALS) tablet Take 1 tablet by mouth daily.    . mupirocin ointment (BACTROBAN) 2 % Place 1 application into the nose 2 (two) times  daily. (Patient taking differently: Place 1 application into the nose as needed. ) 22 g 0  . omeprazole (PRILOSEC) 40 MG capsule TAKE 1 Capsule BY MOUTH ONCE DAILY 90 capsule 3  . PROVENTIL HFA 108 (90 Base) MCG/ACT inhaler INHALE 1 TO 2 PUFFS BY MOUTH EVERY 6 HOURS AS NEEDED FOR COUGHING, WHEEZING, OR SHORTNESS OF BREATH (Patient taking differently: Inhale 1-2 puffs into the lungs every 6 (six) hours as needed for wheezing or shortness of breath. ) 20.1 g 1  . Simethicone (GAS RELIEF PO) Take 1 tablet by mouth as needed (gas).     . sodium chloride (OCEAN) 0.65 % SOLN nasal spray Place 1 spray into both nostrils as needed for congestion.    Marland Kitchen SYNTHROID 25 MCG tablet TAKE 1 Tablet BY MOUTH ONCE DAILY BEFORE BREAKFAST (Patient taking differently: Take 25 mcg by mouth daily before breakfast. ) 90 tablet 1  . busPIRone (BUSPAR) 7.5 MG tablet Take 1 tablet (7.5 mg total) by mouth 2 (two) times daily. (Patient not taking: Reported on 09/17/2019) 60 tablet 3  . CALCIUM PO Take 1 tablet by mouth daily.     No current facility-administered medications for this visit.    Allergies as of 10/16/2019 - Review Complete 10/16/2019  Allergen Reaction Noted  . Clarithromycin Diarrhea and Other (See Comments) 02/22/2012  . Propoxyphene Itching 01/25/2018    Family History  Problem Relation Age of Onset  . Diabetes Father   . Colon cancer Maternal Grandmother   . Heart disease Paternal Grandmother   . Cancer Paternal Grandmother        esophageal  . Hypertension Paternal Grandmother   . GER disease Paternal Grandmother   . Osteoporosis Paternal Grandmother     Social History   Socioeconomic History  . Marital status: Divorced    Spouse name: Not on file  . Number of children: Not on file  . Years of education: Not on file  . Highest education level: Not on file  Occupational History  . Not on file  Tobacco Use  . Smoking status: Former Smoker    Packs/day: 0.50    Years: 27.00    Pack  years: 13.50    Quit date: 08/24/2018    Years since quitting: 1.1  . Smokeless tobacco: Former Systems developer  . Tobacco comment: vape former user  Substance and Sexual Activity  . Alcohol use: Not Currently    Alcohol/week: 1.0 standard drinks    Types: 1 Cans of beer per week    Comment: hx binge drinker. none since 12/16/17  . Drug use: Not Currently    Types: Marijuana, Cocaine, Methamphetamines, Heroin    Comment: no cocaine, heroin, meth since 11-27-2017. last marijuana use 01-21-18  . Sexual activity: Not Currently    Birth control/protection: Surgical  Other Topics Concern  . Not on file  Social History  Narrative  . Not on file   Social Determinants of Health   Financial Resource Strain:   . Difficulty of Paying Living Expenses: Not on file  Food Insecurity:   . Worried About Charity fundraiser in the Last Year: Not on file  . Ran Out of Food in the Last Year: Not on file  Transportation Needs:   . Lack of Transportation (Medical): Not on file  . Lack of Transportation (Non-Medical): Not on file  Physical Activity:   . Days of Exercise per Week: Not on file  . Minutes of Exercise per Session: Not on file  Stress:   . Feeling of Stress : Not on file  Social Connections:   . Frequency of Communication with Friends and Family: Not on file  . Frequency of Social Gatherings with Friends and Family: Not on file  . Attends Religious Services: Not on file  . Active Member of Clubs or Organizations: Not on file  . Attends Archivist Meetings: Not on file  . Marital Status: Not on file    Review of Systems: Gen: Denies fever, chills, anorexia. Denies fatigue, weakness, weight loss.  CV: Denies chest pain, palpitations, syncope, peripheral edema, and claudication. Resp: Denies dyspnea at rest, cough, wheezing, coughing up blood, and pleurisy. GI see HPI Derm: Denies rash, itching, dry skin Psych: Denies depression, anxiety, memory loss, confusion. No homicidal or suicidal  ideation.  Heme: Denies bruising, bleeding, and enlarged lymph nodes.  Physical Exam: BP (!) 148/91   Pulse 71   Temp (!) 96.2 F (35.7 C)   Ht 5\' 4"  (1.626 m)   Wt 280 lb (127 kg)   LMP 10/04/2019   BMI 48.06 kg/m  General:   Alert and oriented. No distress noted. Pleasant and cooperative.  Head:  Normocephalic and atraumatic. Eyes:  Conjuctiva clear without scleral icterus. Lungs: clear bilaterally Cardiac: S1 S2 present without murmurs.  Abdomen:  +BS, soft, non-tender and non-distended. No rebound or guarding. No HSM or masses noted. Msk:  Symmetrical without gross deformities. Walker for ambulating due to severe hip pain. Extremities:  Without edema. Neurologic:  Alert and  oriented x4 Psych:  Alert and cooperative. Normal mood and affect.  ASSESSMENT: Deborah Barnes is a 44 y.o. female presenting today with a history of Hep C, s/p successful treatment in Dec 2019, attaining SVR. Due to elevated fibrosis score (F3/F4), will favor serial ultrasounds. Low concern for advanced liver disease. She is due for surveillance colonoscopy now due to history of multiple colon polyps. Reporting dysphagia in setting of chronic GERD, with last EGD in 2016.   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 RUQ due in May 2021.   Dysphagia: continue Prilosec 40 mg daily. Will add EGD/dilation at time of surveillance colonoscopy.   PLAN:  Proceed with TCS for polyp surveillance and upper endoscopy/dilation for dysphagia n the near future with Dr. Gala Romney. The risks, benefits, and alternatives have been discussed in detail with patient. They have stated understanding and desire to proceed. PROPOFOL due to polypharmacy  RUQ [place] May 2021  Prilosec daily  Return in 6 months   Annitta Needs, PhD, [facility]

## 2019-10-16 NOTE — Patient Instructions (Signed)
We are arranging a colonoscopy and endoscopy with dilation in the near future.  We will repeat an ultrasound in May 2021.   We will see you in 6 months!  I am so proud of you! Congrats on upcoming graduation!  I enjoyed seeing you again today! As you know, I value our relationship and want to provide genuine, compassionate, and quality care. I welcome your feedback. If you receive a survey regarding your visit,  I greatly appreciate you taking time to fill this out. See you next time!  Annitta Needs, PhD, ANP-BC [facility]

## 2019-10-21 DIAGNOSIS — M654 Radial styloid tenosynovitis [de Quervain]: Secondary | ICD-10-CM

## 2019-10-21 HISTORY — DX: Radial styloid tenosynovitis (de quervain): M65.4

## 2019-10-21 MED FILL — ?CITALOPRAM HBR 20 MG TABLE: 20 | 30 days supply | Qty: 30 | Fill #2

## 2019-10-22 NOTE — Progress Notes (Signed)
Cc'ed to pcp °

## 2019-10-23 ENCOUNTER — Other Ambulatory Visit: Payer: Self-pay

## 2019-10-23 ENCOUNTER — Ambulatory Visit (INDEPENDENT_AMBULATORY_CARE_PROVIDER_SITE_OTHER): Payer: Self-pay | Admitting: Orthopaedic Surgery

## 2019-10-23 ENCOUNTER — Encounter: Payer: Self-pay | Admitting: Orthopaedic Surgery

## 2019-10-23 VITALS — Ht 64.0 in | Wt 281.0 lb

## 2019-10-23 DIAGNOSIS — M1611 Unilateral primary osteoarthritis, right hip: Secondary | ICD-10-CM

## 2019-10-23 DIAGNOSIS — M654 Radial styloid tenosynovitis [de Quervain]: Secondary | ICD-10-CM

## 2019-10-23 DIAGNOSIS — Z6841 Body Mass Index (BMI) 40.0 and over, adult: Secondary | ICD-10-CM

## 2019-10-23 MED ORDER — PEG 3350-KCL-NA BICARB-NACL 420 G PO SOLR: 4000.0000 mL | ORAL | 0 refills | Status: DC

## 2019-10-23 MED FILL — PEG 3350-ELECTROLYTE SOLN: 420 | 1 days supply | Qty: 4000 | Fill #0
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
## 2019-10-26 ENCOUNTER — Other Ambulatory Visit (HOSPITAL_COMMUNITY): Payer: Medicaid Other

## 2019-10-26 ENCOUNTER — Other Ambulatory Visit (HOSPITAL_COMMUNITY)
Admission: RE | Admit: 2019-10-26 | Discharge: 2019-10-26 | Disposition: A | Discharge status: Home / Self Care | Payer: Medicaid Other | Source: Ambulatory Visit | Attending: Orthopaedic Surgery | Admitting: Orthopaedic Surgery

## 2019-10-26 DIAGNOSIS — Z01812 Encounter for preprocedural laboratory examination: Secondary | ICD-10-CM | POA: Insufficient documentation

## 2019-10-26 DIAGNOSIS — Z20822 Contact with and (suspected) exposure to covid-19: Secondary | ICD-10-CM | POA: Insufficient documentation

## 2019-10-26 LAB — SARS CORONAVIRUS 2 (TAT 6-24 HRS): SARS Coronavirus 2: NEGATIVE

## 2019-10-28 ENCOUNTER — Other Ambulatory Visit: Payer: Self-pay

## 2019-10-28 ENCOUNTER — Encounter (HOSPITAL_COMMUNITY): Payer: Self-pay | Admitting: Orthopaedic Surgery

## 2019-10-28 ENCOUNTER — Other Ambulatory Visit: Payer: Self-pay | Admitting: Family Medicine

## 2019-10-28 DIAGNOSIS — E039 Hypothyroidism, unspecified: Secondary | ICD-10-CM

## 2019-10-28 NOTE — Progress Notes (Signed)
Patient denies shortness of breath, fever, cough and chest pain.  PCP - Kathe Becton, [place] Cardiologist - denies Ray Church, NP  Chest x-ray - 05/24/19, 1 view EKG - 05/24/19 Stress Test - denies ECHO - denies Cardiac Cath - denies  ERAS - til 930 am DOS, no drink. Anesthesia review: Yes.  Patient states after 2014 cholecystectomy surgery, patient felt depressed/suicidal.  Patient has previous hx of suicidal ideation.  Other surgical procedures after 2014 surgery, no anesthesia complications r/t depression/suicidal ideation.  Patient is a recovering addict, clean since 01/23/18.  Resides at [facility] in [place].  Staff member Vita Alligood [phone]) will be with patient at hospital.  STOP now taking any Aspirin (unless otherwise instructed by your surgeon), Aleve, Naproxen, Ibuprofen, Motrin, Advil, Goody's, BC's, all herbal medications, fish oil, and all vitamins.   Coronavirus Screening Covid test on 10/26/19 was negative.  Have you experienced the following symptoms:  Cough yes/no: No Fever (>100.34F)  yes/no: No Runny nose yes/no: No Sore throat yes/no: No Difficulty breathing/shortness of breath  yes/no: No  Have you traveled in the last 14 days and where? yes/no: No  Patient verbalized understanding of instructions that were given via phone.

## 2019-10-29 DIAGNOSIS — M654 Radial styloid tenosynovitis [de Quervain]: Secondary | ICD-10-CM

## 2019-10-29 MED ORDER — DEXTROSE 5 % IV SOLN
3.0000 g | INTRAVENOUS | Status: AC
  Administered 2019-10-30: 3 g via INTRAVENOUS
  Filled 2019-10-29: qty 3

## 2019-10-30 ENCOUNTER — Encounter (HOSPITAL_COMMUNITY)
Admission: RE | Disposition: A | Discharge status: Home / Self Care | Payer: Self-pay | Source: Home / Self Care | Attending: Orthopaedic Surgery

## 2019-10-30 ENCOUNTER — Ambulatory Visit (HOSPITAL_COMMUNITY): Payer: Self-pay | Admitting: Physician Assistant

## 2019-10-30 ENCOUNTER — Other Ambulatory Visit: Payer: Self-pay

## 2019-10-30 ENCOUNTER — Encounter (HOSPITAL_COMMUNITY): Payer: Self-pay | Admitting: Orthopaedic Surgery

## 2019-10-30 ENCOUNTER — Ambulatory Visit (HOSPITAL_COMMUNITY)
Admission: RE | Admit: 2019-10-30 | Discharge: 2019-10-30 | Disposition: A | Discharge status: Home / Self Care | Payer: Self-pay | Attending: Orthopaedic Surgery | Admitting: Orthopaedic Surgery

## 2019-10-30 DIAGNOSIS — J449 Chronic obstructive pulmonary disease, unspecified: Secondary | ICD-10-CM | POA: Insufficient documentation

## 2019-10-30 DIAGNOSIS — M1611 Unilateral primary osteoarthritis, right hip: Secondary | ICD-10-CM | POA: Insufficient documentation

## 2019-10-30 DIAGNOSIS — M654 Radial styloid tenosynovitis [de Quervain]: Secondary | ICD-10-CM | POA: Insufficient documentation

## 2019-10-30 DIAGNOSIS — Z87891 Personal history of nicotine dependence: Secondary | ICD-10-CM | POA: Insufficient documentation

## 2019-10-30 DIAGNOSIS — Z6841 Body Mass Index (BMI) 40.0 and over, adult: Secondary | ICD-10-CM | POA: Insufficient documentation

## 2019-10-30 DIAGNOSIS — I10 Essential (primary) hypertension: Secondary | ICD-10-CM | POA: Insufficient documentation

## 2019-10-30 HISTORY — DX: Pain in right hip: M25.551

## 2019-10-30 HISTORY — DX: Hypothyroidism, unspecified: E03.9

## 2019-10-30 HISTORY — DX: Dependence on other enabling machines and devices: Z99.89

## 2019-10-30 HISTORY — DX: Personal history of other diseases of the digestive system: Z87.19

## 2019-10-30 HISTORY — PX: DORSAL COMPARTMENT RELEASE: SHX5039

## 2019-10-30 LAB — COMPREHENSIVE METABOLIC PANEL
ALT: 16 U/L (ref 0–44)
AST: 17 U/L (ref 15–41)
Albumin: 4 g/dL (ref 3.5–5.0)
Alkaline Phosphatase: 59 U/L (ref 38–126)
Anion gap: 11 (ref 5–15)
BUN: 7 mg/dL (ref 6–20)
CO2: 21 mmol/L — ABNORMAL LOW (ref 22–32)
Calcium: 9.3 mg/dL (ref 8.9–10.3)
Chloride: 105 mmol/L (ref 98–111)
Creatinine, Ser: 0.59 mg/dL (ref 0.44–1.00)
GFR calc Af Amer: >60 mL/min (ref >60)
GFR calc non Af Amer: >60 mL/min (ref >60)
Glucose, Bld: 82 mg/dL (ref 70–99)
Potassium: 3.7 mmol/L (ref 3.5–5.1)
Sodium: 137 mmol/L (ref 135–145)
Total Bilirubin: 0.6 mg/dL (ref 0.3–1.2)
Total Protein: 7.7 g/dL (ref 6.5–8.1)

## 2019-10-30 LAB — SURGICAL PCR SCREEN
MRSA, PCR: NEGATIVE
Staphylococcus aureus: NEGATIVE

## 2019-10-30 LAB — CBC
HCT: 37.6 % (ref 36.0–46.0)
Hemoglobin: 12.2 g/dL (ref 12.0–15.0)
MCH: 29.5 pg (ref 26.0–34.0)
MCHC: 32.4 g/dL (ref 30.0–36.0)
MCV: 90.8 fL (ref 80.0–100.0)
Platelets: 361 10*3/uL (ref 150–400)
RBC: 4.14 MIL/uL (ref 3.87–5.11)
RDW: 13.2 % (ref 11.5–15.5)
WBC: 10.4 10*3/uL (ref 4.0–10.5)
nRBC: 0 % (ref 0.0–0.2)

## 2019-10-30 LAB — POCT PREGNANCY, URINE: Preg Test, Ur: NEGATIVE

## 2019-10-30 SURGERY — RELEASE, FIRST DORSAL COMPARTMENT, HAND
Anesthesia: Monitor Anesthesia Care | Site: Wrist | Laterality: Right

## 2019-10-30 MED ORDER — LACTATED RINGERS IV SOLN: INTRAVENOUS | Status: DC | PRN

## 2019-10-30 MED ORDER — PROPOFOL 500 MG/50ML IV EMUL
INTRAVENOUS | Status: DC | PRN
  Administered 2019-10-30: 100 ug/kg/min via INTRAVENOUS

## 2019-10-30 MED ORDER — 0.9 % SODIUM CHLORIDE (POUR BTL) OPTIME
TOPICAL | Status: DC | PRN
  Administered 2019-10-30: 19:00:00 1000 mL

## 2019-10-30 MED ORDER — BUPIVACAINE LIPOSOME 1.3 % IJ SUSP
INTRAMUSCULAR | Status: DC | PRN
  Administered 2019-10-30: 2 mL

## 2019-10-30 MED ORDER — BUPIVACAINE LIPOSOME 1.3 % IJ SUSP
10.0000 mL | Freq: Once | INTRAMUSCULAR | Status: DC
  Filled 2019-10-30: qty 20

## 2019-10-30 MED ORDER — BUPIVACAINE HCL 0.25 % IJ SOLN
INTRAMUSCULAR | Status: DC | PRN
  Administered 2019-10-30: 9 mL

## 2019-10-30 MED ORDER — LACTATED RINGERS IV SOLN: INTRAVENOUS | Status: DC

## 2019-10-30 MED ORDER — CHLORHEXIDINE GLUCONATE 4 % EX LIQD: 60.0000 mL | Freq: Once | CUTANEOUS | Status: DC

## 2019-10-30 MED ORDER — BUPIVACAINE HCL (PF) 0.25 % IJ SOLN
INTRAMUSCULAR | Status: AC
  Filled 2019-10-30: qty 30

## 2019-10-30 SURGICAL SUPPLY — 42 items
BENZOIN TINCTURE PRP APPL 2/3 (GAUZE/BANDAGES/DRESSINGS) ×3 IMPLANT
BNDG ELASTIC 3X5.8 VLCR STR LF (GAUZE/BANDAGES/DRESSINGS) ×3 IMPLANT
BNDG ELASTIC 4X5.8 VLCR STR LF (GAUZE/BANDAGES/DRESSINGS) IMPLANT
CLOSURE STERI-STRIP 1/4X4 (GAUZE/BANDAGES/DRESSINGS) ×3 IMPLANT
CLOSURE WOUND 1/2 X4 (GAUZE/BANDAGES/DRESSINGS) ×1
CORD BIPOLAR FORCEPS 12FT (ELECTRODE) ×3 IMPLANT
COVER SURGICAL LIGHT HANDLE (MISCELLANEOUS) ×3 IMPLANT
CUFF TOURN SGL QUICK 18X4 (TOURNIQUET CUFF) IMPLANT
CUFF TOURN SGL QUICK 24 (TOURNIQUET CUFF)
CUFF TRNQT CYL 24X4X16.5-23 (TOURNIQUET CUFF) IMPLANT
DURAPREP 26ML APPLICATOR (WOUND CARE) ×3 IMPLANT
ELECT REM PT RETURN 9FT ADLT (ELECTROSURGICAL) ×3
ELECTRODE REM PT RTRN 9FT ADLT (ELECTROSURGICAL) ×1 IMPLANT
GAUZE SPONGE 4X4 12PLY STRL (GAUZE/BANDAGES/DRESSINGS) ×3 IMPLANT
GLOVE BIOGEL PI IND STRL 8 (GLOVE) ×2 IMPLANT
GLOVE BIOGEL PI INDICATOR 8 (GLOVE) ×4
GLOVE ORTHO TXT STRL SZ7.5 (GLOVE) ×6 IMPLANT
GOWN STRL REUS W/ TWL LRG LVL3 (GOWN DISPOSABLE) ×1 IMPLANT
GOWN STRL REUS W/ TWL XL LVL3 (GOWN DISPOSABLE) ×1 IMPLANT
GOWN STRL REUS W/TWL 2XL LVL3 (GOWN DISPOSABLE) ×3 IMPLANT
GOWN STRL REUS W/TWL LRG LVL3 (GOWN DISPOSABLE) ×2
GOWN STRL REUS W/TWL XL LVL3 (GOWN DISPOSABLE) ×2
KIT BASIN OR (CUSTOM PROCEDURE TRAY) ×3 IMPLANT
KIT TURNOVER KIT B (KITS) ×3 IMPLANT
MANIFOLD NEPTUNE II (INSTRUMENTS) ×3 IMPLANT
NEEDLE HYPO 25GX1X1/2 BEV (NEEDLE) ×3 IMPLANT
NS IRRIG 1000ML POUR BTL (IV SOLUTION) ×3 IMPLANT
PACK ORTHO EXTREMITY (CUSTOM PROCEDURE TRAY) ×3 IMPLANT
PAD ARMBOARD 7.5X6 YLW CONV (MISCELLANEOUS) ×6 IMPLANT
PAD CAST 4YDX4 CTTN HI CHSV (CAST SUPPLIES) ×1 IMPLANT
PADDING CAST COTTON 4X4 STRL (CAST SUPPLIES) ×2
STRIP CLOSURE SKIN 1/2X4 (GAUZE/BANDAGES/DRESSINGS) ×2 IMPLANT
SUCTION FRAZIER HANDLE 10FR (MISCELLANEOUS) ×2
SUCTION TUBE FRAZIER 10FR DISP (MISCELLANEOUS) ×1 IMPLANT
SUT ETHILON 4 0 PS 2 18 (SUTURE) ×3 IMPLANT
SUT VIC AB 3-0 PS2 18 (SUTURE) ×6 IMPLANT
SYR CONTROL 10ML LL (SYRINGE) ×3 IMPLANT
TOWEL GREEN STERILE (TOWEL DISPOSABLE) ×3 IMPLANT
TOWEL GREEN STERILE FF (TOWEL DISPOSABLE) ×3 IMPLANT
TUBE CONNECTING 12'X1/4 (SUCTIONS)
TUBE CONNECTING 12X1/4 (SUCTIONS) IMPLANT
WATER STERILE IRR 1000ML POUR (IV SOLUTION) ×3 IMPLANT

## 2019-10-30 NOTE — Anesthesia Procedure Notes (Signed)
Procedure Name: MAC Date/Time: 10/30/2019 7:05 PM Performed by: Eligha Bridegroom, CRNA Pre-anesthesia Checklist: Patient identified, Emergency Drugs available, Suction available, Patient being monitored and Timeout performed Patient Re-evaluated:Patient Re-evaluated prior to induction Oxygen Delivery Method: Nasal cannula Preoxygenation: Pre-oxygenation with 100% oxygen Induction Type: IV induction

## 2019-10-30 NOTE — Progress Notes (Signed)
Made contact with patient and confirmed arrival time is 3:45 and ERAS clears until 1515.  Patient verbalized understanding.
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
## 2019-10-30 NOTE — Op Note (Signed)
Preop diagnosis: Right wrist first dorsal compartment stenosing tenosynovitis.  Postop diagnosis: Same  Procedure: Right first dorsal compartment release.  Surgeon: Rodell Perna, MD  Anesthesia IV sedation plus local Marcaine plus Exparel.  Tourniquet 250 less than 20 minutes.  Findings first dorsal compartment stenosing tenosynovitis.  Procedure after standard prepping and draping IV sedation proximal arm tourniquet prepped with DuraPrep Ancef 3 g sterile skin marker timeout procedure was completed after extremity sheets and drapes were applied.  Arm was wrapped in Esmarch tourniquet inflated.  As shaped incision was made over the first dorsal compartment.  Prominent vein was mobilized.  Radial sensory nerve was identified mobilized over the top of the first dorsal compartment it was split with a 15 blade and then extended with scissors.  It was extremely tight and both tendons were released.  Care was taken to make sure there was not a third tendon or a separate sheath of the first dorsal compartment.  Patient tendons were pulled demonstrating abduction and extension of the thumb.  Tourniquet deflated irrigated hemostasis with bipolar obtained 3-0 Vicryl subtendinous reapproximation and 3-0 Vicryl subcuticular closure.  Marcaine Exparel injected total of 8 cc.  Tincture benzoin Steri-Strips for force and dorsal thumb splint extending to the mid forearm was then applied fiberglass.  Office follow-up will be 1 week.  Patient will take Tylenol and ibuprofen with past history of narcotic use.

## 2019-10-30 NOTE — Anesthesia Preprocedure Evaluation (Addendum)
Anesthesia Evaluation  Patient identified by MRN, date of birth, ID band Patient awake    Reviewed: Allergy & Precautions, NPO status , Patient's Chart, lab work & pertinent test results  History of Anesthesia Complications (+) Emergence DeliriumNegative for: history of anesthetic complications  Airway Mallampati: II  TM Distance: >3 FB Neck ROM: Full    Dental  (+) Dental Advisory Given, Poor Dentition, Missing, Chipped   Pulmonary COPD,  COPD inhaler, Current Smoker and Patient abstained from smoking.,  10/26/2019 SARS coronavirus NEG   breath sounds clear to auscultation       Cardiovascular hypertension, Pt. on home beta blockers and Pt. on medications (-) angina Rhythm:Regular Rate:Normal     Neuro/Psych negative neurological ROS     GI/Hepatic GERD  Controlled,(+)     substance abuse (recovered x2 years)  , Hepatitis -  Endo/Other  Hypothyroidism Morbid obesity  Renal/GU negative Renal ROS     Musculoskeletal   Abdominal (+) + obese,   Peds  Hematology negative hematology ROS (+)   Anesthesia Other Findings   Reproductive/Obstetrics                            Anesthesia Physical Anesthesia Plan  ASA: III  Anesthesia Plan: MAC   Post-op Pain Management:    Induction:   PONV Risk Score and Plan: 1 and Ondansetron and Dexamethasone  Airway Management Planned: Natural Airway and Simple Face Mask  Additional Equipment:   Intra-op Plan:   Post-operative Plan:   Informed Consent: I have reviewed the patients History and Physical, chart, labs and discussed the procedure including the risks, benefits and alternatives for the proposed anesthesia with the patient or authorized representative who has indicated his/her understanding and acceptance.     Dental advisory given  Plan Discussed with: CRNA and Surgeon  Anesthesia Plan Comments: (Pt requests no narcotics, she needs  to return to her rehabilitation home )       Anesthesia Quick Evaluation

## 2019-10-30 NOTE — Discharge Instructions (Signed)
Keep hand elevated. Tylenol and ibuprofen for post op pain and use ice. You may work one handed as best you can. See Dr. Lorin Mercy in one week .

## 2019-10-30 NOTE — Transfer of Care (Signed)
Immediate Anesthesia Transfer of Care Note  Patient: Deborah Barnes  Procedure(s) Performed: RIGHT WRIST 1ST  DORSAL COMPARTMENT RELEASE (DEQUERVAIN) (Right Wrist)  Patient Location: PACU  Anesthesia Type:MAC  Level of Consciousness: awake, alert  and oriented  Airway & Oxygen Therapy: Patient Spontanous Breathing  Post-op Assessment: Report given to RN and Post -op Vital signs reviewed and stable  Post vital signs: Reviewed and stable  Last Vitals:  Vitals Value Taken Time  BP 160/67 10/30/19 1954  Temp    Pulse 69 10/30/19 1956  Resp 17 10/30/19 1956  SpO2 100 % 10/30/19 1956  Vitals shown include unvalidated device data.  Last Pain:  Vitals:   10/30/19 1648  TempSrc:   PainSc: 6       Patients Stated Pain Goal: 3 (11/55/20 8022)  Complications: No apparent anesthesia complications

## 2019-10-30 NOTE — Progress Notes (Signed)
Attempted to contact patient at both numbers on chart with no contact to notify patient of surgery time change and arrival time of 3:45.

## 2019-10-30 NOTE — Interval H&P Note (Signed)
History and Physical Interval Note:  10/30/2019 6:23 PM  Deborah Barnes  has presented today for surgery, with the diagnosis of right wrsit stenosing tenosynovitis.  The various methods of treatment have been discussed with the patient and family. After consideration of risks, benefits and other options for treatment, the patient has consented to  Procedure(s): RIGHT WRIST 1ST  DORSAL COMPARTMENT RELEASE (DEQUERVAIN) (Right) as a surgical intervention.  The patient's history has been reviewed, patient examined, no change in status, stable for surgery.  I have reviewed the patient's chart and labs.  Questions were answered to the patient's satisfaction.     Marybelle Killings

## 2019-10-30 NOTE — Progress Notes (Signed)
Called to confirm with MD that it's OK to DC to home.

## 2019-11-01 NOTE — Anesthesia Postprocedure Evaluation (Signed)
Anesthesia Post Note  Patient: Psychiatric nurse  Procedure(s) Performed: RIGHT WRIST 1ST  DORSAL COMPARTMENT RELEASE (DEQUERVAIN) (Right Wrist)     Patient location during evaluation: PACU Anesthesia Type: MAC Level of consciousness: awake and alert Pain management: pain level controlled Vital Signs Assessment: post-procedure vital signs reviewed and stable Respiratory status: spontaneous breathing, nonlabored ventilation, respiratory function stable and patient connected to nasal cannula oxygen Cardiovascular status: stable and blood pressure returned to baseline Postop Assessment: no apparent nausea or vomiting Anesthetic complications: no    Last Vitals:  Vitals:   10/30/19 2000 10/30/19 2015  BP: (!) 163/61 (!) 162/66  Pulse: 66 64  Resp: 17 (!) 22  Temp:  (!) 36.4 C  SpO2: 100% 100%    Last Pain:  Vitals:   10/30/19 2015  TempSrc:   PainSc: [place]

## 2019-11-06 ENCOUNTER — Inpatient Hospital Stay: Payer: Medicaid Other | Admitting: Orthopaedic Surgery

## 2019-11-08 ENCOUNTER — Other Ambulatory Visit: Payer: Self-pay

## 2019-11-08 ENCOUNTER — Ambulatory Visit (INDEPENDENT_AMBULATORY_CARE_PROVIDER_SITE_OTHER): Payer: Self-pay | Admitting: Orthopaedic Surgery

## 2019-11-08 ENCOUNTER — Encounter: Payer: Self-pay | Admitting: Orthopaedic Surgery

## 2019-11-08 VITALS — BP 156/87 | HR 68 | Ht 64.02 in | Wt 281.0 lb

## 2019-11-08 DIAGNOSIS — M1611 Unilateral primary osteoarthritis, right hip: Secondary | ICD-10-CM

## 2019-11-08 DIAGNOSIS — M654 Radial styloid tenosynovitis [de Quervain]: Secondary | ICD-10-CM
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
## 2019-11-18 MED FILL — ?CITALOPRAM HBR 20 MG TABLE: 20 | 30 days supply | Qty: 30 | Fill #3

## 2019-11-19 ENCOUNTER — Other Ambulatory Visit: Payer: Self-pay

## 2019-11-19 ENCOUNTER — Ambulatory Visit: Payer: Self-pay

## 2019-11-26 ENCOUNTER — Ambulatory Visit: Payer: Self-pay | Admitting: Family Medicine

## 2019-11-27 ENCOUNTER — Telehealth: Payer: Self-pay | Admitting: Orthopaedic Surgery

## 2019-11-27 NOTE — Telephone Encounter (Signed)
Patient called.  She is wanting contact information for the facility she was referred to.   Call back: [phone]

## 2019-11-28 ENCOUNTER — Telehealth: Payer: Self-pay | Admitting: Orthopaedic Surgery

## 2019-11-28 NOTE — Telephone Encounter (Signed)
Patient wants you to call her. She was referred to a [facility] and didn't have money at the time but does now. She wants the name and number of facility so she can call and get an appt.  Please call patient to advise. [phone] home [phone]

## 2019-11-28 NOTE — Telephone Encounter (Signed)
I spoke with patient and advised. 

## 2019-11-28 NOTE — Telephone Encounter (Signed)
I left voicemail for return call. 

## 2019-11-28 NOTE — Telephone Encounter (Signed)
I called patient and advised. 

## 2019-12-23 MED FILL — ?CITALOPRAM HBR 20 MG TABLE: 20 | 30 days supply | Qty: 30 | Fill #4

## 2020-01-01 ENCOUNTER — Telehealth: Payer: Self-pay | Admitting: Gastroenterology

## 2020-01-01 NOTE — Telephone Encounter (Signed)
Recall for ultrasound 

## 2020-01-01 NOTE — Telephone Encounter (Signed)
Letter mailed

## 2020-01-09 MED FILL — NULYTELY LEMON-LI SOL: 1 days supply | Qty: 4000 | Fill #0

## 2020-01-10 NOTE — Patient Instructions (Signed)
Your procedure is scheduled on: 01/16/2020  Report to [facility] at   10:15  AM.  Call this number if you have problems the morning of surgery: [phone]   Remember:              Follow Directions on the letter you received from Your Physician's office regarding the Bowel Prep              No Smoking the day of Procedure :   Take these medicines the morning of surgery with A SIP OF WATER: Celexa, claritin, metoprolol, and synthyroid   Do not wear jewelry, make-up or nail polish.    Do not bring valuables to the hospital.  Contacts, dentures or bridgework may not be worn into surgery.  .   Patients discharged the day of surgery will not be allowed to drive home.     Colonoscopy, Adult, Care After This sheet gives you information about how to care for yourself after your procedure. Your health care provider may also give you more specific instructions. If you have problems or questions, contact your health care provider. What can I expect after the procedure? After the procedure, it is common to have:  A small amount of blood in your stool for 24 hours after the procedure.  Some gas.  Mild abdominal cramping or bloating.  Follow these instructions at home: General instructions   For the first 24 hours after the procedure: ? Do not drive or use machinery. ? Do not sign important documents. ? Do not drink alcohol. ? Do your regular daily activities at a slower pace than normal. ? Eat soft, easy-to-digest foods. ? Rest often.  Take over-the-counter or prescription medicines only as told by your health care provider.  It is up to you to get the results of your procedure. Ask your health care provider, or the department performing the procedure, when your results will be ready. Relieving cramping and bloating  Try walking around when you have cramps or feel bloated.  Apply heat to your abdomen as told by your health care provider. Use a heat source that your health  care provider recommends, such as a moist heat pack or a heating pad. ? Place a towel between your skin and the heat source. ? Leave the heat on for 20-30 minutes. ? Remove the heat if your skin turns bright red. This is especially important if you are unable to feel pain, heat, or cold. You may have a greater risk of getting burned. Eating and drinking  Drink enough fluid to keep your urine clear or pale yellow.  Resume your normal diet as instructed by your health care provider. Avoid heavy or fried foods that are hard to digest.  Avoid drinking alcohol for as long as instructed by your health care provider. Contact a health care provider if:  You have blood in your stool 2-3 days after the procedure. Get help right away if:  You have more than a small spotting of blood in your stool.  You pass large blood clots in your stool.  Your abdomen is swollen.  You have nausea or vomiting.  You have a fever.  You have increasing abdominal pain that is not relieved with medicine. This information is not intended to replace advice given to you by your health care provider. Make sure you discuss any questions you have with your health care provider. Document Released: 04/19/2004 Document Revised: 05/30/2016 Document Reviewed: 11/17/2015 Elsevier Interactive Patient Education  2018 [place].  Upper Endoscopy, Adult, Care After This sheet gives you information about how to care for yourself after your procedure. Your health care provider may also give you more specific instructions. If you have problems or questions, contact your health care provider. What can I expect after the procedure? After the procedure, it is common to have:  A sore throat.  Mild stomach pain or discomfort.  Bloating.  Nausea. Follow these instructions at home:   Follow instructions from your health care provider about what to eat or drink after your procedure.  Return to your normal activities as  told by your health care provider. Ask your health care provider what activities are safe for you.  Take over-the-counter and prescription medicines only as told by your health care provider.  Do not drive for 24 hours if you were given a sedative during your procedure.  Keep all follow-up visits as told by your health care provider. This is important. Contact a health care provider if you have:  A sore throat that lasts longer than one day.  Trouble swallowing. Get help right away if:  You vomit blood or your vomit looks like coffee grounds.  You have: ? A fever. ? Bloody, black, or tarry stools. ? A severe sore throat or you cannot swallow. ? Difficulty breathing. ? Severe pain in your chest or abdomen. Summary  After the procedure, it is common to have a sore throat, mild stomach discomfort, bloating, and nausea.  Do not drive for 24 hours if you were given a sedative during the procedure.  Follow instructions from your health care provider about what to eat or drink after your procedure.  Return to your normal activities as told by your health care provider. This information is not intended to replace advice given to you by your health care provider. Make sure you discuss any questions you have with your health care provider. Document Revised: 02/27/2018 Document Reviewed: 02/05/2018 Elsevier Patient Education  [place].  Esophageal Dilatation Esophageal dilatation, also called esophageal dilation, is a procedure to widen or open (dilate) a blocked or narrowed part of the esophagus. The esophagus is the part of the body that moves food and liquid from the mouth to the stomach. You may need this procedure if:  You have a buildup of scar tissue in your esophagus that makes it difficult, painful, or impossible to swallow. This can be caused by gastroesophageal reflux disease (GERD).  You have cancer of the esophagus.  There is a problem with how food moves through  your esophagus. In some cases, you may need this procedure repeated at a later time to dilate the esophagus gradually. Tell a health care provider about:  Any allergies you have.  All medicines you are taking, including vitamins, herbs, eye drops, creams, and over-the-counter medicines.  Any problems you or family members have had with anesthetic medicines.  Any blood disorders you have.  Any surgeries you have had.  Any medical conditions you have.  Any antibiotic medicines you are required to take before dental procedures.  Whether you are pregnant or may be pregnant. What are the risks? Generally, this is a safe procedure. However, problems may occur, including:  Bleeding due to a tear in the lining of the esophagus.  A hole (perforation) in the esophagus. What happens before the procedure?  Follow instructions from your health care provider about eating or drinking restrictions.  Ask your health care provider about changing or stopping your  regular medicines. This is especially important if you are taking diabetes medicines or blood thinners.  Plan to have someone take you home from the hospital or clinic.  Plan to have a responsible adult care for you for at least 24 hours after you leave the hospital or clinic. This is important. What happens during the procedure?  You may be given a medicine to help you relax (sedative).  A numbing medicine may be sprayed into the back of your throat, or you may gargle the medicine.  Your health care provider may perform the dilatation using various surgical instruments, such as: ? Simple dilators. This instrument is carefully placed in the esophagus to stretch it. ? Guided wire bougies. This involves using an endoscope to insert a wire into the esophagus. A dilator is passed over this wire to enlarge the esophagus. Then the wire is removed. ? Balloon dilators. An endoscope with a small balloon at the end is inserted into the  esophagus. The balloon is inflated to stretch the esophagus and open it up. The procedure may vary among health care providers and hospitals. What happens after the procedure?  Your blood pressure, heart rate, breathing rate, and blood oxygen level will be monitored until the medicines you were given have worn off.  Your throat may feel slightly sore and numb. This will improve slowly over time.  You will not be allowed to eat or drink until your throat is no longer numb.  When you are able to drink, urinate, and sit on the edge of the bed without nausea or dizziness, you may be able to return home. Follow these instructions at home:  Take over-the-counter and prescription medicines only as told by your health care provider.  Do not drive for 24 hours if you were given a sedative during your procedure.  You should have a responsible adult with you for 24 hours after the procedure.  Follow instructions from your health care provider about any eating or drinking restrictions.  Do not use any products that contain nicotine or tobacco, such as cigarettes and e-cigarettes. If you need help quitting, ask your health care provider.  Keep all follow-up visits as told by your health care provider. This is important. Get help right away if you:  Have a fever.  Have chest pain.  Have pain that is not relieved by medication.  Have trouble breathing.  Have trouble swallowing.  Vomit blood. Summary  Esophageal dilatation, also called esophageal dilation, is a procedure to widen or open (dilate) a blocked or narrowed part of the esophagus.  Plan to have someone take you home from the hospital or clinic.  For this procedure, a numbing medicine may be sprayed into the back of your throat, or you may gargle the medicine.  Do not drive for 24 hours if you were given a sedative during your procedure. This information is not intended to replace advice given to you by your health care  provider. Make sure you discuss any questions you have with your health care provider. Document Revised: 07/03/2019 Document Reviewed: 07/11/2017 Elsevier Patient Education  2020 [employer].

## 2020-01-14 ENCOUNTER — Other Ambulatory Visit (HOSPITAL_COMMUNITY)
Admission: RE | Admit: 2020-01-14 | Discharge: 2020-01-14 | Disposition: A | Discharge status: Home / Self Care | Payer: Medicaid Other | Source: Ambulatory Visit | Attending: Internal Medicine | Admitting: Internal Medicine

## 2020-01-14 ENCOUNTER — Encounter (HOSPITAL_COMMUNITY): Payer: Self-pay

## 2020-01-14 ENCOUNTER — Encounter (HOSPITAL_COMMUNITY)
Admission: RE | Admit: 2020-01-14 | Discharge: 2020-01-14 | Disposition: A | Discharge status: Home / Self Care | Payer: Self-pay | Source: Ambulatory Visit | Attending: Internal Medicine | Admitting: Internal Medicine

## 2020-01-14 ENCOUNTER — Other Ambulatory Visit: Payer: Self-pay

## 2020-01-14 DIAGNOSIS — Z01812 Encounter for preprocedural laboratory examination: Secondary | ICD-10-CM | POA: Insufficient documentation

## 2020-01-14 DIAGNOSIS — Z20822 Contact with and (suspected) exposure to covid-19: Secondary | ICD-10-CM | POA: Insufficient documentation

## 2020-01-14 LAB — HCG, SERUM, QUALITATIVE: Preg, Serum: NEGATIVE

## 2020-01-15 LAB — SARS CORONAVIRUS 2 (TAT 6-24 HRS): SARS Coronavirus 2: NEGATIVE

## 2020-01-16 ENCOUNTER — Encounter (HOSPITAL_COMMUNITY)
Admission: RE | Disposition: A | Discharge status: Home / Self Care | Payer: Self-pay | Source: Home / Self Care | Attending: Internal Medicine

## 2020-01-16 ENCOUNTER — Ambulatory Visit (HOSPITAL_COMMUNITY)
Admission: RE | Admit: 2020-01-16 | Discharge: 2020-01-16 | Disposition: A | Discharge status: Home / Self Care | Payer: Self-pay | Attending: Internal Medicine | Admitting: Internal Medicine

## 2020-01-16 ENCOUNTER — Ambulatory Visit (HOSPITAL_COMMUNITY): Payer: Self-pay | Admitting: Anesthesiology

## 2020-01-16 ENCOUNTER — Encounter (HOSPITAL_COMMUNITY): Payer: Self-pay | Admitting: Internal Medicine

## 2020-01-16 DIAGNOSIS — E039 Hypothyroidism, unspecified: Secondary | ICD-10-CM | POA: Insufficient documentation

## 2020-01-16 DIAGNOSIS — K5730 Diverticulosis of large intestine without perforation or abscess without bleeding: Secondary | ICD-10-CM | POA: Insufficient documentation

## 2020-01-16 DIAGNOSIS — F4310 Post-traumatic stress disorder, unspecified: Secondary | ICD-10-CM | POA: Insufficient documentation

## 2020-01-16 DIAGNOSIS — F329 Major depressive disorder, single episode, unspecified: Secondary | ICD-10-CM | POA: Insufficient documentation

## 2020-01-16 DIAGNOSIS — Z1211 Encounter for screening for malignant neoplasm of colon: Secondary | ICD-10-CM | POA: Insufficient documentation

## 2020-01-16 DIAGNOSIS — Z79899 Other long term (current) drug therapy: Secondary | ICD-10-CM | POA: Insufficient documentation

## 2020-01-16 DIAGNOSIS — K219 Gastro-esophageal reflux disease without esophagitis: Secondary | ICD-10-CM | POA: Insufficient documentation

## 2020-01-16 DIAGNOSIS — R1310 Dysphagia, unspecified: Secondary | ICD-10-CM | POA: Insufficient documentation

## 2020-01-16 DIAGNOSIS — D125 Benign neoplasm of sigmoid colon: Secondary | ICD-10-CM | POA: Insufficient documentation

## 2020-01-16 DIAGNOSIS — I10 Essential (primary) hypertension: Secondary | ICD-10-CM | POA: Insufficient documentation

## 2020-01-16 DIAGNOSIS — D123 Benign neoplasm of transverse colon: Secondary | ICD-10-CM | POA: Insufficient documentation

## 2020-01-16 DIAGNOSIS — Z86010 Personal history of colonic polyps: Secondary | ICD-10-CM | POA: Insufficient documentation

## 2020-01-16 DIAGNOSIS — K635 Polyp of colon: Secondary | ICD-10-CM

## 2020-01-16 DIAGNOSIS — J449 Chronic obstructive pulmonary disease, unspecified: Secondary | ICD-10-CM | POA: Insufficient documentation

## 2020-01-16 DIAGNOSIS — F418 Other specified anxiety disorders: Secondary | ICD-10-CM | POA: Insufficient documentation

## 2020-01-16 HISTORY — PX: COLONOSCOPY WITH PROPOFOL: SHX5780

## 2020-01-16 HISTORY — PX: POLYPECTOMY: SHX5525

## 2020-01-16 HISTORY — PX: MALONEY DILATION: SHX5535

## 2020-01-16 HISTORY — PX: ESOPHAGOGASTRODUODENOSCOPY (EGD) WITH PROPOFOL: SHX5813

## 2020-01-16 SURGERY — COLONOSCOPY WITH PROPOFOL
Anesthesia: General

## 2020-01-16 MED ORDER — LACTATED RINGERS IV SOLN: INTRAVENOUS | Status: DC | PRN

## 2020-01-16 MED ORDER — LACTATED RINGERS IV SOLN: Freq: Once | INTRAVENOUS | Status: AC

## 2020-01-16 MED ORDER — LIDOCAINE VISCOUS HCL 2 % MT SOLN
15.0000 mL | Freq: Once | OROMUCOSAL | Status: AC
  Administered 2020-01-16: 11:00:00 15 mL via OROMUCOSAL

## 2020-01-16 MED ORDER — PROPOFOL 500 MG/50ML IV EMUL
INTRAVENOUS | Status: DC | PRN
  Administered 2020-01-16: 80 ug/kg/min via INTRAVENOUS

## 2020-01-16 MED ORDER — PROPOFOL 10 MG/ML IV BOLUS
INTRAVENOUS | Status: DC | PRN
Start: 2020-01-16 — End: 2020-01-16
  Administered 2020-01-16: 50 mg via INTRAVENOUS
  Administered 2020-01-16: 100 mg via INTRAVENOUS
  Administered 2020-01-16: 50 mg via INTRAVENOUS
  Administered 2020-01-16: 30 mg via INTRAVENOUS
  Administered 2020-01-16: 20 mg via INTRAVENOUS

## 2020-01-16 MED ORDER — LIDOCAINE VISCOUS HCL 2 % MT SOLN
OROMUCOSAL | Status: AC
  Filled 2020-01-16: qty 15

## 2020-01-16 MED ORDER — CHLORHEXIDINE GLUCONATE CLOTH 2 % EX PADS: 6.0000 | MEDICATED_PAD | Freq: Once | CUTANEOUS | Status: DC

## 2020-01-16 MED ORDER — PROPOFOL 10 MG/ML IV BOLUS
INTRAVENOUS | Status: AC
  Filled 2020-01-16: qty 60

## 2020-01-16 MED ORDER — LIDOCAINE HCL (CARDIAC) PF 100 MG/5ML IV SOSY
PREFILLED_SYRINGE | INTRAVENOUS | Status: DC | PRN
Start: 2020-01-16 — End: 2020-01-16
  Administered 2020-01-16: 50 mg via INTRAVENOUS

## 2020-01-16 NOTE — Discharge Instructions (Signed)
Colonoscopy Discharge Instructions  Read the instructions outlined below and refer to this sheet in the next few weeks. These discharge instructions provide you with general information on caring for yourself after you leave the hospital. Your doctor may also give you specific instructions. While your treatment has been planned according to the most current medical practices available, unavoidable complications occasionally occur. If you have any problems or questions after discharge, call Dr. Gala Romney at [phone]. ACTIVITY  You may resume your regular activity, but move at a slower pace for the next 24 hours.   Take frequent rest periods for the next 24 hours.   Walking will help get rid of the air and reduce the bloated feeling in your belly (abdomen).   No driving for 24 hours (because of the medicine (anesthesia) used during the test).    Do not sign any important legal documents or operate any machinery for 24 hours (because of the anesthesia used during the test).  NUTRITION  Drink plenty of fluids.   You may resume your normal diet as instructed by your doctor.   Begin with a light meal and progress to your normal diet. Heavy or fried foods are harder to digest and may make you feel sick to your stomach (nauseated).   Avoid alcoholic beverages for 24 hours or as instructed.  MEDICATIONS  You may resume your normal medications unless your doctor tells you otherwise.  WHAT YOU CAN EXPECT TODAY  Some feelings of bloating in the abdomen.   Passage of more gas than usual.   Spotting of blood in your stool or on the toilet paper.  IF YOU HAD POLYPS REMOVED DURING THE COLONOSCOPY:  No aspirin products for 7 days or as instructed.   No alcohol for 7 days or as instructed.   Eat a soft diet for the next 24 hours.  FINDING OUT THE RESULTS OF YOUR TEST Not all test results are available during your visit. If your test results are not back during the visit, make an appointment  with your caregiver to find out the results. Do not assume everything is normal if you have not heard from your caregiver or the medical facility. It is important for you to follow up on all of your test results.  SEEK IMMEDIATE MEDICAL ATTENTION IF:  You have more than a spotting of blood in your stool.   Your belly is swollen (abdominal distention).   You are nauseated or vomiting.   You have a temperature over 101.   You have abdominal pain or discomfort that is severe or gets worse throughout the day.  EGD Discharge instructions Please read the instructions outlined below and refer to this sheet in the next few weeks. These discharge instructions provide you with general information on caring for yourself after you leave the hospital. Your doctor may also give you specific instructions. While your treatment has been planned according to the most current medical practices available, unavoidable complications occasionally occur. If you have any problems or questions after discharge, please call your doctor. ACTIVITY  You may resume your regular activity but move at a slower pace for the next 24 hours.   Take frequent rest periods for the next 24 hours.   Walking will help expel (get rid of) the air and reduce the bloated feeling in your abdomen.   No driving for 24 hours (because of the anesthesia (medicine) used during the test).   You may shower.   Do not sign any important  legal documents or operate any machinery for 24 hours (because of the anesthesia used during the test).  NUTRITION  Drink plenty of fluids.   You may resume your normal diet.   Begin with a light meal and progress to your normal diet.   Avoid alcoholic beverages for 24 hours or as instructed by your caregiver.  MEDICATIONS  You may resume your normal medications unless your caregiver tells you otherwise.  WHAT YOU CAN EXPECT TODAY  You may experience abdominal discomfort such as a feeling of fullness  or "gas" pains.  FOLLOW-UP  Your doctor will discuss the results of your test with you.  SEEK IMMEDIATE MEDICAL ATTENTION IF ANY OF THE FOLLOWING OCCUR:  Excessive nausea (feeling sick to your stomach) and/or vomiting.   Severe abdominal pain and distention (swelling).   Trouble swallowing.   Temperature over 101 F (37.8 C).   Rectal bleeding or vomiting of blood.    Polyp and diverticulosis information provided  Further recommendations to follow pending review of pathology report  At patient request, I called [facility] at [phone] -left message on voicemail Office visit with [place] in 3 months    Colon Polyps  Polyps are tissue growths inside the body. Polyps can grow in many places, including the large intestine (colon). A polyp may be a round bump or a mushroom-shaped growth. You could have one polyp or several. Most colon polyps are noncancerous (benign). However, some colon polyps can become cancerous over time. Finding and removing the polyps early can help prevent this. What are the causes? The exact cause of colon polyps is not known. What increases the risk? You are more likely to develop this condition if you:  Have a family history of colon cancer or colon polyps.  Are older than 40 or older than 45 if you are African American.  Have inflammatory bowel disease, such as ulcerative colitis or Crohn's disease.  Have certain hereditary conditions, such as: ? Familial adenomatous polyposis. ? Lynch syndrome. ? Turcot syndrome. ? Peutz-Jeghers syndrome.  Are overweight.  Smoke cigarettes.  Do not get enough exercise.  Drink too much alcohol.  Eat a diet that is high in fat and red meat and low in fiber.  Had childhood cancer that was treated with abdominal radiation. What are the signs or symptoms? Most polyps do not cause symptoms. If you have symptoms, they may include:  Blood coming from your rectum when having a bowel movement.  Blood in your  stool. The stool may look dark red or black.  Abdominal pain.  A change in bowel habits, such as constipation or diarrhea. How is this diagnosed? This condition is diagnosed with a colonoscopy. This is a procedure in which a lighted, flexible scope is inserted into the anus and then passed into the colon to examine the area. Polyps are sometimes found when a colonoscopy is done as part of routine cancer screening tests. How is this treated? Treatment for this condition involves removing any polyps that are found. Most polyps can be removed during a colonoscopy. Those polyps will then be tested for cancer. Additional treatment may be needed depending on the results of testing. Follow these instructions at home: Lifestyle  Maintain a healthy weight, or lose weight if recommended by your health care provider.  Exercise every day or as told by your health care provider.  Do not use any products that contain nicotine or tobacco, such as cigarettes and e-cigarettes. If you need help quitting, ask your health  care provider.  If you drink alcohol, limit how much you have: ? 0-1 drink a day for women. ? 0-2 drinks a day for men.  Be aware of how much alcohol is in your drink. In the [place] one drink equals one 12 oz bottle of beer (355 mL), one 5 oz glass of wine (148 mL), or one 1 oz shot of hard liquor (44 mL). Eating and drinking   Eat foods that are high in fiber, such as fruits, vegetables, and whole grains.  Eat foods that are high in calcium and vitamin D, such as milk, cheese, yogurt, eggs, liver, fish, and broccoli.  Limit foods that are high in fat, such as fried foods and desserts.  Limit the amount of red meat and processed meat you eat, such as hot dogs, sausage, bacon, and lunch meats. General instructions  Keep all follow-up visits as told by your health care provider. This is important. ? This includes having regularly scheduled colonoscopies. ? Talk to your health care  provider about when you need a colonoscopy. Contact a health care provider if:  You have new or worsening bleeding during a bowel movement.  You have new or increased blood in your stool.  You have a change in bowel habits.  You lose weight for no known reason. Summary  Polyps are tissue growths inside the body. Polyps can grow in many places, including the colon.  Most colon polyps are noncancerous (benign), but some can become cancerous over time.  This condition is diagnosed with a colonoscopy.  Treatment for this condition involves removing any polyps that are found. Most polyps can be removed during a colonoscopy. This information is not intended to replace advice given to you by your health care provider. Make sure you discuss any questions you have with your health care provider. Document Revised: 12/21/2017 Document Reviewed: 12/21/2017 Elsevier Patient Education  [place].     Diverticulosis  Diverticulosis is a condition that develops when small pouches (diverticula) form in the wall of the large intestine (colon). The colon is where water is absorbed and stool (feces) is formed. The pouches form when the inside layer of the colon pushes through weak spots in the outer layers of the colon. You may have a few pouches or many of them. The pouches usually do not cause problems unless they become inflamed or infected. When this happens, the condition is called diverticulitis. What are the causes? The cause of this condition is not known. What increases the risk? The following factors may make you more likely to develop this condition:  Being older than age 68. Your risk for this condition increases with age. Diverticulosis is rare among people younger than age 48. By age 59, many people have it.  Eating a low-fiber diet.  Having frequent constipation.  Being overweight.  Not getting enough exercise.  Smoking.  Taking over-the-counter pain medicines, like  aspirin and ibuprofen.  Having a family history of diverticulosis. What are the signs or symptoms? In most people, there are no symptoms of this condition. If you do have symptoms, they may include:  Bloating.  Cramps in the abdomen.  Constipation or diarrhea.  Pain in the lower left side of the abdomen. How is this diagnosed? Because diverticulosis usually has no symptoms, it is most often diagnosed during an exam for other colon problems. The condition may be diagnosed by:  Using a flexible scope to examine the colon (colonoscopy).  Taking an X-ray of the colon  after dye has been put into the colon (barium enema).  Having a CT scan. How is this treated? You may not need treatment for this condition. Your health care provider may recommend treatment to prevent problems. You may need treatment if you have symptoms or if you previously had diverticulitis. Treatment may include:  Eating a high-fiber diet.  Taking a fiber supplement.  Taking a live bacteria supplement (probiotic).  Taking medicine to relax your colon. Follow these instructions at home: Medicines  Take over-the-counter and prescription medicines only as told by your health care provider.  If told by your health care provider, take a fiber supplement or probiotic. Constipation prevention Your condition may cause constipation. To prevent or treat constipation, you may need to:  Drink enough fluid to keep your urine pale yellow.  Take over-the-counter or prescription medicines.  Eat foods that are high in fiber, such as beans, whole grains, and fresh fruits and vegetables.  Limit foods that are high in fat and processed sugars, such as fried or sweet foods.  General instructions  Try not to strain when you have a bowel movement.  Keep all follow-up visits as told by your health care provider. This is important. Contact a health care provider if you:  Have pain in your abdomen.  Have bloating.  Have  cramps.  Have not had a bowel movement in 3 days. Get help right away if:  Your pain gets worse.  Your bloating becomes very bad.  You have a fever or chills, and your symptoms suddenly get worse.  You vomit.  You have bowel movements that are bloody or black.  You have bleeding from your rectum. Summary  Diverticulosis is a condition that develops when small pouches (diverticula) form in the wall of the large intestine (colon).  You may have a few pouches or many of them.  This condition is most often diagnosed during an exam for other colon problems.  Treatment may include increasing the fiber in your diet, taking supplements, or taking medicines. This information is not intended to replace advice given to you by your health care provider. Make sure you discuss any questions you have with your health care provider. Document Revised: 04/04/2019 Document Reviewed: 04/04/2019 Elsevier Patient Education  [place] These instructions provide you with information about caring for yourself after your procedure. Your health care provider may also give you more specific instructions. Your treatment has been planned according to current medical practices, but problems sometimes occur. Call your health care provider if you have any problems or questions after your procedure. What can I expect after the procedure? After your procedure, you may:  Feel sleepy for several hours.  Feel clumsy and have poor balance for several hours.  Feel forgetful about what happened after the procedure.  Have poor judgment for several hours.  Feel nauseous or vomit.  Have a sore throat if you had a breathing tube during the procedure. Follow these instructions at home: For at least 24 hours after the procedure:      Have a responsible adult stay with you. It is important to have someone help care for you until you are awake and alert.  Rest  as needed.  Do not: ? Participate in activities in which you could fall or become injured. ? Drive. ? Use heavy machinery. ? Drink alcohol. ? Take sleeping pills or medicines that cause drowsiness. ? Make important decisions or sign legal  documents. ? Take care of children on your own. Eating and drinking  Follow the diet that is recommended by your health care provider.  If you vomit, drink water, juice, or soup when you can drink without vomiting.  Make sure you have little or no nausea before eating solid foods. General instructions  Take over-the-counter and prescription medicines only as told by your health care provider.  If you have sleep apnea, surgery and certain medicines can increase your risk for breathing problems. Follow instructions from your health care provider about wearing your sleep device: ? Anytime you are sleeping, including during daytime naps. ? While taking prescription pain medicines, sleeping medicines, or medicines that make you drowsy.  If you smoke, do not smoke without supervision.  Keep all follow-up visits as told by your health care provider. This is important. Contact a health care provider if:  You keep feeling nauseous or you keep vomiting.  You feel light-headed.  You develop a rash.  You have a fever. Get help right away if:  You have trouble breathing. Summary  For several hours after your procedure, you may feel sleepy and have poor judgment.  Have a responsible adult stay with you for at least 24 hours or until you are awake and alert. This information is not intended to replace advice given to you by your health care provider. Make sure you discuss any questions you have with your health care provider. Document Revised: 12/04/2017 Document Reviewed: 12/27/2015 Elsevier Patient Education  [place].

## 2020-01-16 NOTE — Anesthesia Postprocedure Evaluation (Signed)
Anesthesia Post Note  Patient: Psychiatric nurse  Procedure(s) Performed: COLONOSCOPY WITH PROPOFOL (N/A ) ESOPHAGOGASTRODUODENOSCOPY (EGD) WITH PROPOFOL (N/A ) MALONEY DILATION (N/A ) POLYPECTOMY  Patient location during evaluation: PACU Anesthesia Type: General Level of consciousness: awake and alert, oriented and patient cooperative Pain management: satisfactory to patient Vital Signs Assessment: post-procedure vital signs reviewed and stable Respiratory status: spontaneous breathing Cardiovascular status: blood pressure returned to baseline Anesthetic complications: no     Last Vitals:  Vitals:   01/16/20 1135 01/16/20 1145  BP:  104/73  Pulse:  73  Resp:  19  Temp: 36.6 C   SpO2:  94%    Last Pain:  Vitals:   01/16/20 1135  TempSrc:   PainSc: Bergen Farley Crooker

## 2020-01-16 NOTE — H&P (Signed)
@LOGO @   Primary Care Physician:  Lanae Boast, FNP Primary Gastroenterologist:  Dr.   Pre-Procedure History & Physical: HPI:  Deborah Barnes is a 44 y.o. female here for further evaluation of GERD and esophageal dysphagia.  History of multiple colonic adenomas.  Here for colonoscopy.  Also, here for EGD with possible esophageal dilation as feasible/appropriate per plan.  Past Medical History:  Diagnosis Date  . Asthma   . Back pain   . Bulging disc   . Complication of anesthesia 2014   anesthesia ? caused depression/suicidal ideation per pt.  Marland Kitchen COPD (chronic obstructive pulmonary disease) ([place])   . Degenerative disc disease   . Depression   . Depression with anxiety   . GERD (gastroesophageal reflux disease)   . Hepatitis C 08/2017  . Hip pain, right   . History of alcohol abuse   . History of IBS   . History of mixed drug abuse ([place])   . Hypercholesterolemia   . Hypertension   . Hypoglycemia   . Hypothyroidism   . PTSD (post-traumatic stress disorder)   . Sciatica   . Shortness of breath   . Thyroid disease   . Walker as ambulation aid     Past Surgical History:  Procedure Laterality Date  . BIOPSY N/A 09/25/2014   Procedure: BIOPSY;  Surgeon: Daneil Dolin, MD;  Location: [facility];  Service: Endoscopy;  Laterality: N/A;  Gastric, Ascending Colon, Descending/Sigmoid Colon   . CARPAL TUNNEL RELEASE Bilateral   . CHOLECYSTECTOMY N/A 04/26/2013   Procedure: LAPAROSCOPIC CHOLECYSTECTOMY;  Surgeon: Jamesetta So, MD;  Location: [facility];  Service: General;  Laterality: N/A;  . COLONOSCOPY WITH PROPOFOL N/A 09/25/2014   RMR: Pancolonic diverticulosis. multiple tubular adenomas, segmental biopsies negative. surveillance in 5 years  . DORSAL COMPARTMENT RELEASE Right 10/30/2019   Procedure: RIGHT WRIST 1ST  DORSAL COMPARTMENT RELEASE (DEQUERVAIN);  Surgeon: Marybelle Killings, MD;  Location: [facility];  Service: Orthopedics;  Laterality: Right;  . ESOPHAGOGASTRODUODENOSCOPY (EGD) WITH  PROPOFOL N/A 09/25/2014   RMR: Normal esophagus. Abnormal gastric mucosa status post biopsy (negative H.pylori). Hiatal hernia.   Fatima Blank HERNIA REPAIR N/A 10/04/2013   Procedure: HERNIA REPAIR INCISIONAL WITH MESH;  Surgeon: Jamesetta So, MD;  Location: [facility];  Service: General;  Laterality: N/A;  . INSERTION OF MESH N/A 10/04/2013   Procedure: INSERTION OF MESH;  Surgeon: Jamesetta So, MD;  Location: [facility];  Service: General;  Laterality: N/A;  . POLYPECTOMY N/A 09/25/2014   Procedure: POLYPECTOMY;  Surgeon: Daneil Dolin, MD;  Location: [facility];  Service: Endoscopy;  Laterality: N/A;  Cecal, Ascending Colon   . SHOULDER SURGERY Left   . TOOTH EXTRACTION  08/09/2019  . TUBAL LIGATION    . WISDOM TOOTH EXTRACTION      Prior to Admission medications   Medication Sig Start Date End Date Taking? Authorizing Provider  acetaminophen (TYLENOL) 500 MG tablet Take 1,000 mg by mouth every 8 (eight) hours as needed for mild pain or headache.    Yes [provider]  citalopram (CELEXA) 20 MG tablet Take 1 tablet (20 mg total) by mouth daily. 08/28/19 02/24/20 Yes Azzie Glatter, FNP  ibuprofen (ADVIL,MOTRIN) 200 MG tablet Take 800 mg by mouth every 8 (eight) hours as needed for fever or moderate pain.    Yes [provider]  loratadine (CLARITIN) 10 MG tablet TAKE 1 Tablet BY MOUTH ONCE DAILY Patient taking differently: Take 10 mg by mouth daily.  08/05/19  Yes Tresa Garter, MD  Melatonin 10 MG TABS Take 40 mg by mouth at bedtime.    Yes [provider]  metoprolol tartrate (LOPRESSOR) 50 MG tablet Take 50 mg by mouth 2 (two) times daily.    Yes [provider]  omeprazole (PRILOSEC) 40 MG capsule TAKE 1 Capsule BY MOUTH ONCE DAILY Patient taking differently: Take 40 mg by mouth daily.  08/05/19  Yes Annitta Needs, NP  OVER THE COUNTER MEDICATION Take 2 capsules by mouth 2 (two) times daily as needed (pain). Hemp Capsules   Yes [provider]   PROVENTIL HFA 108 (90 Base) MCG/ACT inhaler INHALE 1 TO 2 PUFFS BY MOUTH EVERY 6 HOURS AS NEEDED FOR COUGHING, WHEEZING, OR SHORTNESS OF BREATH Patient taking differently: Inhale 1-2 puffs into the lungs every 6 (six) hours as needed for wheezing or shortness of breath.  08/02/18  Yes Soyla Dryer, PA-C  simethicone (MYLICON) 0000000 MG chewable tablet Chew 125 mg by mouth every 6 (six) hours as needed for flatulence.   Yes [provider]  SYNTHROID 25 MCG tablet TAKE 1 Tablet BY MOUTH ONCE DAILY BEFORE BREAKFAST Patient taking differently: Take 25 mcg by mouth daily before breakfast.  10/28/19  Yes Azzie Glatter, FNP  diclofenac sodium (VOLTAREN) 1 % GEL Apply 4 g topically 4 (four) times daily. Patient not taking: Reported on 12/31/2019 05/01/19   Lanae Boast, [place]    Allergies as of 10/23/2019 - Review Complete 10/23/2019  Allergen Reaction Noted  . Clarithromycin Diarrhea and Other (See Comments) 02/22/2012  . Propoxyphene Itching 01/25/2018    Family History  Problem Relation Age of Onset  . Diabetes Father   . Colon cancer Maternal Grandmother   . Heart disease Paternal Grandmother   . Cancer Paternal Grandmother        esophageal  . Hypertension Paternal Grandmother   . GER disease Paternal Grandmother   . Osteoporosis Paternal Grandmother     Social History   Socioeconomic History  . Marital status: Divorced    Spouse name: Not on file  . Number of children: Not on file  . Years of education: Not on file  . Highest education level: Not on file  Occupational History  . Not on file  Tobacco Use  . Smoking status: Former Smoker    Packs/day: 0.50    Years: 27.00    Pack years: 13.50    Quit date: 08/24/2018    Years since quitting: 1.3  . Smokeless tobacco: Never Used  . Tobacco comment: vape former user  Substance and Sexual Activity  . Alcohol use: Not Currently    Alcohol/week: 1.0 standard drinks    Types: 1 Cans of beer per week    Comment: hx  binge drinker. none since 01/23/18  . Drug use: Not Currently    Types: Marijuana, Cocaine, Methamphetamines, Heroin    Comment: no cocaine, heroin, meth since 01/23/18. last marijuana use 01-23-18  . Sexual activity: Not Currently    Birth control/protection: Surgical    Comment: tubal ligation  Other Topics Concern  . Not on file  Social History Narrative  . Not on file   Social Determinants of Health   Financial Resource Strain:   . Difficulty of Paying Living Expenses:   Food Insecurity:   . Worried About Charity fundraiser in the Last Year:   . Arboriculturist in the Last Year:   Transportation Needs:   . Lack of Transportation (  Medical):   Marland Kitchen Lack of Transportation (Non-Medical):   Physical Activity:   . Days of Exercise per Week:   . Minutes of Exercise per Session:   Stress:   . Feeling of Stress :   Social Connections:   . Frequency of Communication with Friends and Family:   . Frequency of Social Gatherings with Friends and Family:   . Attends Religious Services:   . Active Member of Clubs or Organizations:   . Attends Archivist Meetings:   Marland Kitchen Marital Status:   Intimate Partner Violence:   . Fear of Current or Ex-Partner:   . Emotionally Abused:   Marland Kitchen Physically Abused:   . Sexually Abused:     Review of Systems: See HPI, otherwise negative ROS  Physical Exam: LMP 01/01/2020  General:   Alert,  Well-developed, well-nourished, pleasant and cooperative in NAD SNeck:  Supple; no masses or thyromegaly. No significant cervical adenopathy. Lungs:  Clear throughout to auscultation.   No wheezes, crackles, or rhonchi. No acute distress. Heart:  Regular rate and rhythm; no murmurs, clicks, rubs,  or gallops. Abdomen: Non-distended, normal bowel sounds.  Soft and nontender without appreciable mass or hepatosplenomegaly.  Pulses:  Normal pulses noted. Extremities:  Without clubbing or edema.  Impression/Plan: 44 year old lady with GERD now with esophageal  dysphagia and history of multiple colonic adenomas.  Here for EGD with possible esophageal dilation and colonoscopy per plan.  The risks, benefits, limitations, imponderables and alternatives regarding both EGD and colonoscopy have been reviewed with the patient. Questions have been answered. All parties agreeable.      Notice: This dictation was prepared with Dragon dictation along with smaller phrase technology. Any transcriptional errors that result from this process are unintentional and may not be corrected upon review.

## 2020-01-16 NOTE — Op Note (Signed)
[facility] Patient Name: Deborah Barnes Procedure Date: 01/16/2020 11:06 AM MRN: [id number] Date of Birth: [birth year] Attending MD: Norvel Richards , MD CSN: [place]:[id number] Age: 44 Admit Type: Outpatient Procedure:                Colonoscopy Indications:              High risk colon cancer surveillance: Personal                            history of colonic polyps Providers:                Norvel Richards, MD, Otis Peak B. Sharon Seller, RN,                            Nelma Rothman, Technician Referring MD:              Medicines:                Propofol per Anesthesia Complications:            No immediate complications. Estimated Blood Loss:     Estimated blood loss was minimal. Procedure:                Pre-Anesthesia Assessment:                           - Prior to the procedure, a History and Physical                            was performed, and patient medications and                            allergies were reviewed. The patient's tolerance of                            previous anesthesia was also reviewed. The risks                            and benefits of the procedure and the sedation                            options and risks were discussed with the patient.                            All questions were answered, and informed consent                            was obtained. Prior Anticoagulants: The patient has                            taken no previous anticoagulant or antiplatelet                            agents. ASA Grade Assessment: II - A patient with  mild systemic disease. After reviewing the risks                            and benefits, the patient was deemed in                            satisfactory condition to undergo the procedure.                           After obtaining informed consent, the colonoscope                            was passed under direct vision. Throughout the                            procedure, the  patient's blood pressure, pulse, and                            oxygen saturations were monitored continuously. The                            [id number] scope was introduced through                            the anus and advanced to the the cecum, identified                            by appendiceal orifice and ileocecal valve. The                            colonoscopy was performed without difficulty. The                            patient tolerated the procedure well. The quality                            of the bowel preparation was adequate. Scope In: 11:12:55 AM Scope Out: 11:29:27 AM Scope Withdrawal Time: 0 hours 13 minutes 48 seconds  Total Procedure Duration: 0 hours 16 minutes 32 seconds  Findings:      The perianal and digital rectal examinations were normal.      Scattered small and large-mouthed diverticula were found in the entire       colon.      Two semi-pedunculated polyps were found in the sigmoid colon and splenic       flexure. The polyps were 4 to 6 mm in size. These polyps were removed       with a cold snare. Resection and retrieval were complete. Estimated       blood loss was minimal.      A 10 mm polyp was found in the sigmoid colon. The polyp was       pedunculated. The polyp was removed with a hot snare. Resection and       retrieval were complete. Estimated blood loss: none.      The exam was otherwise without abnormality on direct and retroflexion  views. Impression:               - Diverticulosis in the entire examined colon.                           - Two 4 to 6 mm polyps in the sigmoid colon and at                            the splenic flexure, removed with a cold snare.                            Resected and retrieved.                           - One 10 mm polyp in the sigmoid colon, removed                            with a hot snare. Resected and retrieved.                           - The examination was otherwise normal on  direct                            and retroflexion views. Moderate Sedation:      Moderate (conscious) sedation was personally administered by an       anesthesia professional. The following parameters were monitored: oxygen       saturation, heart rate, blood pressure, respiratory rate, EKG, adequacy       of pulmonary ventilation, and response to care. Recommendation:           - Patient has a contact number available for                            emergencies. The signs and symptoms of potential                            delayed complications were discussed with the                            patient. Return to normal activities tomorrow.                            Written discharge instructions were provided to the                            patient.                           - Resume previous diet.                           - Continue present medications.                           - Repeat colonoscopy date to be determined after  pending pathology results are reviewed for                            surveillance.                           - Return to GI office in 3 months. See EGD report Procedure Code(s):        --- Professional ---                           [phone], Colonoscopy, flexible; with removal of                            tumor(s), polyp(s), or other lesion(s) by snare                            technique Diagnosis Code(s):        --- Professional ---                           K63.5, Polyp of colon                           Z86.010, Personal history of colonic polyps                           K57.30, Diverticulosis of large intestine without                            perforation or abscess without bleeding CPT copyright 2019 American Medical Association. All rights reserved. The codes documented in this report are preliminary and upon coder review may  be revised to meet current compliance requirements. Cristopher Estimable. Jozlyn Schatz, MD Norvel Richards,  MD 01/16/2020 11:34:30 AM This report has been signed electronically. Number of Addenda: 0

## 2020-01-16 NOTE — Anesthesia Preprocedure Evaluation (Signed)
Anesthesia Evaluation  Patient identified by MRN, date of birth, ID band Patient awake    Reviewed: Allergy & Precautions, NPO status , Patient's Chart, lab work & pertinent test results, reviewed documented beta blocker date and time   History of Anesthesia Complications (+) history of anesthetic complications  Airway Mallampati: II  TM Distance: >3 FB Neck ROM: Full    Dental  (+) Dental Advisory Given, Poor Dentition, Chipped   Pulmonary shortness of breath, asthma , COPD,  COPD inhaler, former smoker,    Pulmonary exam normal breath sounds clear to auscultation       Cardiovascular hypertension, Pt. on medications and Pt. on home beta blockers  Rhythm:Regular Rate:Normal     Neuro/Psych PSYCHIATRIC DISORDERS Anxiety Depression Bipolar Disorder  Neuromuscular disease    GI/Hepatic Bowel prep,GERD  Medicated and Controlled,(+)     substance abuse (multi drug use, last used 2 years ago)  , Hepatitis -, C  Endo/Other  Hypothyroidism   Renal/GU negative Renal ROS     Musculoskeletal  (+) Arthritis ,   Abdominal   Peds  Hematology negative hematology ROS (+)   Anesthesia Other Findings   Reproductive/Obstetrics negative OB ROS                             Anesthesia Physical Anesthesia Plan  ASA: III  Anesthesia Plan: General   Post-op Pain Management:    Induction: Intravenous  PONV Risk Score and Plan: 2  Airway Management Planned: Nasal Cannula, Natural Airway and Simple Face Mask  Additional Equipment:   Intra-op Plan:   Post-operative Plan:   Informed Consent: I have reviewed the patients History and Physical, chart, labs and discussed the procedure including the risks, benefits and alternatives for the proposed anesthesia with the patient or authorized representative who has indicated his/her understanding and acceptance.     Dental advisory given  Plan Discussed  with: CRNA and Surgeon  Anesthesia Plan Comments:         Anesthesia Quick Evaluation

## 2020-01-16 NOTE — Op Note (Signed)
[facility] Patient Name: Deborah Barnes Procedure Date: 01/16/2020 10:34 AM MRN: [id number] Date of Birth: 11-24-1975 Attending MD: Norvel Richards , MD CSN: [place]:[id number] Age: 44 Admit Type: Outpatient Procedure:                Upper GI endoscopy Indications:              Dysphagia Providers:                Norvel Richards, MD, Jeanann Lewandowsky. Sharon Seller, RN,                            Aram Candela Referring MD:              Medicines:                Propofol per Anesthesia Complications:            No immediate complications. Estimated Blood Loss:     Estimated blood loss: none. Procedure:                Pre-Anesthesia Assessment:                           - Prior to the procedure, a History and Physical                            was performed, and patient medications and                            allergies were reviewed. The patient's tolerance of                            previous anesthesia was also reviewed. The risks                            and benefits of the procedure and the sedation                            options and risks were discussed with the patient.                            All questions were answered, and informed consent                            was obtained. Prior Anticoagulants: The patient has                            taken no previous anticoagulant or antiplatelet                            agents. ASA Grade Assessment: II - A patient with                            mild systemic disease. After reviewing the risks  and benefits, the patient was deemed in                            satisfactory condition to undergo the procedure.                           After obtaining informed consent, the endoscope was                            passed under direct vision. Throughout the                            procedure, the patient's blood pressure, pulse, and                            oxygen saturations were monitored  continuously. The                            [id number] scope was introduced through the                            mouth, and advanced to the second part of duodenum.                            The upper GI endoscopy was accomplished without                            difficulty. The patient tolerated the procedure                            well. Scope In: 10:59:40 AM Scope Out: 11:05:55 AM Total Procedure Duration: 0 hours 6 minutes 15 seconds  Findings:      The examined esophagus was normal.      The entire examined stomach was normal.      The duodenal bulb and second portion of the duodenum were normal. The       scope was withdrawn. Dilation was performed with a Maloney dilator with       moderate resistance at 54 Fr. The dilation site was examined following       endoscope reinsertion and showed no change. Estimated blood loss: none. Impression:               - Normal esophagus. Dilated.                           - Normal stomach.                           - Normal duodenal bulb and second portion of the                            duodenum.                           - No specimens collected. Moderate Sedation:      Moderate (conscious) sedation was personally administered  by an       anesthesia professional. The following parameters were monitored: oxygen       saturation, heart rate, blood pressure, respiratory rate, EKG, adequacy       of pulmonary ventilation, and response to care. Recommendation:           - Patient has a contact number available for                            emergencies. The signs and symptoms of potential                            delayed complications were discussed with the                            patient. Return to normal activities tomorrow.                            Written discharge instructions were provided to the                            patient.                           - Advance diet as tolerated.                           -  Continue present medications.                           - Return to my office in 3 months. See colonoscopy                            report. Procedure Code(s):        --- Professional ---                           [phone], Esophagogastroduodenoscopy, flexible,                            transoral; diagnostic, including collection of                            specimen(s) by brushing or washing, when performed                            (separate procedure)                           43450, Dilation of esophagus, by unguided sound or                            bougie, single or multiple passes Diagnosis Code(s):        --- Professional ---                           R13.10, Dysphagia, unspecified CPT copyright 2019 American Medical Association.  All rights reserved. The codes documented in this report are preliminary and upon coder review may  be revised to meet current compliance requirements. Cristopher Estimable. Jehad Bisono, MD Norvel Richards, MD 01/16/2020 11:30:33 AM This report has been signed electronically. Number of Addenda: 0

## 2020-01-16 NOTE — Transfer of Care (Signed)
Immediate Anesthesia Transfer of Care Note  Patient: Ertha Rountree  Procedure(s) Performed: COLONOSCOPY WITH PROPOFOL (N/A ) ESOPHAGOGASTRODUODENOSCOPY (EGD) WITH PROPOFOL (N/A ) MALONEY DILATION (N/A ) POLYPECTOMY  Patient Location: PACU  Anesthesia Type:General  Level of Consciousness: awake, alert  and oriented  Airway & Oxygen Therapy: Patient Spontanous Breathing  Post-op Assessment: Report given to RN and Post -op Vital signs reviewed and stable  Post vital signs: Reviewed and stable  Last Vitals:  Vitals Value Taken Time  BP 94/63 01/16/20 1135  Temp    Pulse 71 01/16/20 1137  Resp 17 01/16/20 1137  SpO2 96 % 01/16/20 1137  Vitals shown include unvalidated device data.  Last Pain:  Vitals:   01/16/20 1052  TempSrc:   PainSc: 5          Complications: No apparent anesthesia complications

## 2020-01-17 ENCOUNTER — Encounter: Payer: Self-pay | Admitting: Internal Medicine

## 2020-01-17 LAB — SURGICAL PATHOLOGY

## 2020-01-20 MED FILL — ?CITALOPRAM HBR 20 MG TABLE: 20 | 30 days supply | Qty: 30 | Fill #5

## 2020-02-18 DIAGNOSIS — E559 Vitamin D deficiency, unspecified: Secondary | ICD-10-CM

## 2020-02-18 HISTORY — DX: Vitamin D deficiency, unspecified: E55.9

## 2020-02-20 ENCOUNTER — Other Ambulatory Visit: Payer: Self-pay | Admitting: Family Medicine

## 2020-02-20 DIAGNOSIS — I10 Essential (primary) hypertension: Secondary | ICD-10-CM

## 2020-02-20 DIAGNOSIS — J309 Allergic rhinitis, unspecified: Secondary | ICD-10-CM

## 2020-02-24 ENCOUNTER — Other Ambulatory Visit: Payer: Self-pay | Admitting: Family Medicine

## 2020-02-24 DIAGNOSIS — F419 Anxiety disorder, unspecified: Secondary | ICD-10-CM

## 2020-02-24 DIAGNOSIS — F39 Unspecified mood [affective] disorder: Secondary | ICD-10-CM

## 2020-03-10 ENCOUNTER — Ambulatory Visit (INDEPENDENT_AMBULATORY_CARE_PROVIDER_SITE_OTHER): Payer: Self-pay | Admitting: Orthopaedic Surgery

## 2020-03-10 ENCOUNTER — Encounter: Payer: Self-pay | Admitting: Orthopaedic Surgery

## 2020-03-10 VITALS — BP 147/96 | HR 71 | Ht 64.0 in | Wt 270.0 lb

## 2020-03-10 DIAGNOSIS — M67472 Ganglion, left ankle and foot: Secondary | ICD-10-CM
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
## 2020-03-13 ENCOUNTER — Encounter: Payer: Self-pay | Admitting: Family Medicine

## 2020-03-13 ENCOUNTER — Other Ambulatory Visit: Payer: Self-pay

## 2020-03-13 ENCOUNTER — Ambulatory Visit (INDEPENDENT_AMBULATORY_CARE_PROVIDER_SITE_OTHER): Payer: Self-pay | Admitting: Family Medicine

## 2020-03-13 VITALS — BP 144/76 | HR 62 | Temp 97.3°F | Ht 64.0 in | Wt 270.1 lb

## 2020-03-13 DIAGNOSIS — E039 Hypothyroidism, unspecified: Secondary | ICD-10-CM

## 2020-03-13 DIAGNOSIS — N924 Excessive bleeding in the premenopausal period: Secondary | ICD-10-CM

## 2020-03-13 DIAGNOSIS — F419 Anxiety disorder, unspecified: Secondary | ICD-10-CM

## 2020-03-13 DIAGNOSIS — Z6841 Body Mass Index (BMI) 40.0 and over, adult: Secondary | ICD-10-CM

## 2020-03-13 DIAGNOSIS — H547 Unspecified visual loss: Secondary | ICD-10-CM

## 2020-03-13 DIAGNOSIS — F314 Bipolar disorder, current episode depressed, severe, without psychotic features: Secondary | ICD-10-CM

## 2020-03-13 DIAGNOSIS — M1612 Unilateral primary osteoarthritis, left hip: Secondary | ICD-10-CM

## 2020-03-13 DIAGNOSIS — R002 Palpitations: Secondary | ICD-10-CM

## 2020-03-13 DIAGNOSIS — Z09 Encounter for follow-up examination after completed treatment for conditions other than malignant neoplasm: Secondary | ICD-10-CM

## 2020-03-13 DIAGNOSIS — E66813 Obesity, class 3: Secondary | ICD-10-CM

## 2020-03-13 DIAGNOSIS — Z0000 Encounter for general adult medical examination without abnormal findings: Secondary | ICD-10-CM

## 2020-03-13 DIAGNOSIS — M654 Radial styloid tenosynovitis [de Quervain]: Secondary | ICD-10-CM

## 2020-03-13 NOTE — Progress Notes (Signed)
Patient [facility]   Established Patient Office Visit  Subjective:  Patient ID: Deborah Barnes, female    DOB: May 19, 1976  Age: 44 y.o. MRN: [id number]  CC:  Chief Complaint  Patient presents with  . Follow-up    Feels heart racing then go back to normal Rhythm; was told years ago it happens when her thyroid medication dose is increased.     HPI Deborah Barnes is a 44 year old female who presents for Follow Up today.    Past Medical History:  Diagnosis Date  . Asthma   . Back pain   . Bulging disc   . Complication of anesthesia 2014   anesthesia ? caused depression/suicidal ideation per pt.  Deborah Barnes COPD (chronic obstructive pulmonary disease) ([place])   . De Quervain's tenosynovitis, right 10/2019   surgery  . Degenerative disc disease   . Depression   . Depression with anxiety   . GERD (gastroesophageal reflux disease)   . Hepatitis C 08/2017  . Hip pain, right   . History of alcohol abuse   . History of IBS   . History of mixed drug abuse ([place])   . Hypercholesterolemia   . Hypertension   . Hypoglycemia   . Hypothyroidism   . PTSD (post-traumatic stress disorder)   . Sciatica   . Shortness of breath   . Thyroid disease   . Walker as ambulation aid    Current Status: Since her last office visit, she has had a Hospital Admission for Colonoscopy  on 01/16/2020. is doing well with no complaints. She has also had right wrist surgery for Deborah Barnes with Dr. Lorin Mercy on 10/2019.She continues to reside in [facility] for rehab. She ambulates via walker r/t chronic hip pain. She denies fevers, chills, fatigue, recent infections, weight loss, and night sweats. She has not had any headaches, visual changes, dizziness, and falls. No chest pain, heart palpitations, cough and shortness of breath reported. Denies GI problems such as nausea, vomiting, diarrhea, and constipation. She has no reports of blood in stools, dysuria and hematuria. No  depression or anxiety, and denies suicidal ideations, homicidal ideations, or auditory hallucinations. She is taking all medications as prescribed.   Past Surgical History:  Procedure Laterality Date  . BIOPSY N/A 09/25/2014   Procedure: BIOPSY;  Surgeon: Daneil Dolin, MD;  [facility];  Service: Endoscopy;  Laterality: N/A;  Gastric, Ascending Colon, Descending/Sigmoid Colon   . CARPAL TUNNEL RELEASE Bilateral   . CHOLECYSTECTOMY N/A 04/26/2013   Procedure: LAPAROSCOPIC CHOLECYSTECTOMY;  Surgeon: Jamesetta So, MD;  [facility];  Service: General;  Laterality: N/A;  . COLONOSCOPY WITH PROPOFOL N/A 09/25/2014   RMR: Pancolonic diverticulosis. multiple tubular adenomas, segmental biopsies negative. surveillance in 5 years  . COLONOSCOPY WITH PROPOFOL N/A 01/16/2020   Procedure: COLONOSCOPY WITH PROPOFOL;  Surgeon: Daneil Dolin, MD;  [facility];  Service: Endoscopy;  Laterality: N/A;  12:00pm  . DORSAL COMPARTMENT RELEASE Right 10/30/2019   Procedure: RIGHT WRIST 1ST  DORSAL COMPARTMENT RELEASE (DEQUERVAIN);  Surgeon: Marybelle Killings, MD;  Location: [facility] [place];  Service: Orthopedics;  Laterality: Right;  . ESOPHAGOGASTRODUODENOSCOPY (EGD) WITH PROPOFOL N/A 09/25/2014   RMR: Normal esophagus. Abnormal gastric mucosa status post biopsy (negative H.pylori). Hiatal hernia.   Deborah Barnes ESOPHAGOGASTRODUODENOSCOPY (EGD) WITH PROPOFOL N/A 01/16/2020   Procedure: ESOPHAGOGASTRODUODENOSCOPY (EGD) WITH PROPOFOL;  Surgeon: Daneil Dolin, MD;  [facility];  Service: Endoscopy;  Laterality: N/A;  .  INCISIONAL HERNIA REPAIR N/A 10/04/2013   Procedure: HERNIA REPAIR INCISIONAL WITH MESH;  Surgeon: Jamesetta So, MD;  [facility];  Service: General;  Laterality: N/A;  . INSERTION OF MESH N/A 10/04/2013   Procedure: INSERTION OF MESH;  Surgeon: Jamesetta So, MD;  [facility];  Service: General;  Laterality: N/A;  Deborah Barnes DILATION N/A 01/16/2020   Procedure: Keturah Shavers;   Surgeon: Daneil Dolin, MD;  [facility];  Service: Endoscopy;  Laterality: N/A;  . POLYPECTOMY N/A 09/25/2014   Procedure: POLYPECTOMY;  Surgeon: Daneil Dolin, MD;  [facility];  Service: Endoscopy;  Laterality: N/A;  Cecal, Ascending Colon   . POLYPECTOMY  01/16/2020   Procedure: POLYPECTOMY;  Surgeon: Daneil Dolin, MD;  [facility];  Service: Endoscopy;;  . SHOULDER SURGERY Left   . TOOTH EXTRACTION  08/09/2019  . TUBAL LIGATION    . WISDOM TOOTH EXTRACTION      Family History  Problem Relation Age of Onset  . Diabetes Father   . Colon cancer Maternal Grandmother   . Heart disease Paternal Grandmother   . Cancer Paternal Grandmother        esophageal  . Hypertension Paternal Grandmother   . GER disease Paternal Grandmother   . Osteoporosis Paternal Grandmother     Social History   Socioeconomic History  . Marital status: Divorced    Spouse name: Not on file  . Number of children: Not on file  . Years of education: Not on file  . Highest education level: Not on file  Occupational History  . Not on file  Tobacco Use  . Smoking status: Former Smoker    Packs/day: 0.50    Years: 27.00    Pack years: 13.50    Quit date: 08/24/2018    Years since quitting: 1.5  . Smokeless tobacco: Never Used  . Tobacco comment: vape former user  Vaping Use  . Vaping Use: Never used  Substance and Sexual Activity  . Alcohol use: Not Currently    Alcohol/week: 1.0 standard drink    Types: 1 Cans of beer per week    Comment: hx binge drinker. none since 01/23/18  . Drug use: Not Currently    Types: Marijuana, Cocaine, Methamphetamines, Heroin    Comment: no cocaine, heroin, meth since 01/23/18. last marijuana use 01-23-18  . Sexual activity: Not Currently    Birth control/protection: Surgical    Comment: tubal ligation  Other Topics Concern  . Not on file  Social History Narrative  . Not on file   Social Determinants of Health   Financial Resource  Strain:   . Difficulty of Paying Living Expenses:   Food Insecurity:   . Worried About Charity fundraiser in the Last Year:   . Arboriculturist in the Last Year:   Transportation Needs:   . Film/video editor (Medical):   Deborah Barnes Lack of Transportation (Non-Medical):   Physical Activity:   . Days of Exercise per Week:   . Minutes of Exercise per Session:   Stress:   . Feeling of Stress :   Social Connections:   . Frequency of Communication with Friends and Family:   . Frequency of Social Gatherings with Friends and Family:   . Attends Religious Services:   . Active Member of Clubs or Organizations:   . Attends Archivist Meetings:   Deborah Barnes Marital Status:   Intimate Partner Violence:   .  Fear of Current or Ex-Partner:   . Emotionally Abused:   Deborah Barnes Physically Abused:   . Sexually Abused:     Outpatient Medications Prior to Visit  Medication Sig Dispense Refill  . acetaminophen (TYLENOL) 500 MG tablet Take 1,000 mg by mouth every 8 (eight) hours as needed for mild pain or headache.     . citalopram (CELEXA) 20 MG tablet TAKE 1 TABLET (20 MG TOTAL) BY MOUTH DAILY. 30 tablet 6  . ibuprofen (ADVIL,MOTRIN) 200 MG tablet Take 800 mg by mouth every 8 (eight) hours as needed for fever or moderate pain.     Deborah Barnes loratadine (CLARITIN) 10 MG tablet TAKE 1 Tablet BY MOUTH ONCE DAILY 90 tablet 2  . Melatonin 10 MG TABS Take 40 mg by mouth at bedtime.     . metoprolol tartrate (LOPRESSOR) 50 MG tablet TAKE 1 Tablet  BY MOUTH TWICE DAILY 180 tablet 2  . omeprazole (PRILOSEC) 40 MG capsule TAKE 1 Capsule BY MOUTH ONCE DAILY (Patient taking differently: Take 40 mg by mouth daily. ) 90 capsule 3  . OVER THE COUNTER MEDICATION Take 2 capsules by mouth 2 (two) times daily as needed (pain). Hemp Capsules    . PROVENTIL HFA 108 (90 Base) MCG/ACT inhaler INHALE 1 TO 2 PUFFS BY MOUTH EVERY 6 HOURS AS NEEDED FOR COUGHING, WHEEZING, OR SHORTNESS OF BREATH (Patient taking differently: Inhale 1-2 puffs into  the lungs every 6 (six) hours as needed for wheezing or shortness of breath. ) 20.1 g 1  . simethicone (MYLICON) 546 MG chewable tablet Chew 125 mg by mouth every 6 (six) hours as needed for flatulence.    Deborah Barnes SYNTHROID 25 MCG tablet TAKE 1 Tablet BY MOUTH ONCE DAILY BEFORE BREAKFAST (Patient taking differently: Take 25 mcg by mouth daily before breakfast. ) 90 tablet 1   No facility-administered medications prior to visit.    Allergies  Allergen Reactions  . Clarithromycin Diarrhea    abd pain  . Propoxyphene Itching    ROS Review of Systems  Constitutional: Negative.   HENT: Negative.   Eyes: Negative.   Respiratory: Negative.   Cardiovascular: Negative.   Gastrointestinal: Positive for abdominal distention.  Endocrine: Negative.   Genitourinary: Negative.   Musculoskeletal: Positive for arthralgias (chronic back and knee pain; left hip pain) and back pain (chronic).  Skin: Negative.   Allergic/Immunologic: Negative.   Neurological: Positive for dizziness (occasional ) and headaches (occasional ).  Hematological: Negative.   Psychiatric/Behavioral: Negative.     Objective:    Physical Exam Vitals and nursing note reviewed.  Constitutional:      Appearance: Normal appearance.  HENT:     Head: Normocephalic and atraumatic.     Nose: Nose normal.     Mouth/Throat:     Mouth: Mucous membranes are moist.  Cardiovascular:     Rate and Rhythm: Normal rate and regular rhythm.     Pulses: Normal pulses.     Heart sounds: Normal heart sounds.  Pulmonary:     Effort: Pulmonary effort is normal.     Breath sounds: Normal breath sounds.  Abdominal:     General: Bowel sounds are normal. There is distension (obese).     Palpations: Abdomen is soft.  Musculoskeletal:     Cervical back: Normal range of motion and neck supple.     Comments: Limited ROM in spine and left knee  Skin:    General: Skin is warm and dry.  Neurological:     General: No  focal deficit present.      Mental Status: She is alert and oriented to person, place, and time.  Psychiatric:        Mood and Affect: Mood normal.        Behavior: Behavior normal.        Thought Content: Thought content normal.        Judgment: Judgment normal.     BP (!) 144/76 (BP Location: Left Arm, Patient Position: Sitting, Cuff Size: Large)   Pulse 62   Temp (!) 97.3 F (36.3 C)   Ht 5\' 4"  (1.626 m)   Wt 270 lb 0.8 oz (122.5 kg)   LMP 03/09/2020   SpO2 99%   BMI 46.35 kg/m  Wt Readings from Last 3 Encounters:  03/13/20 270 lb 0.8 oz (122.5 kg)  03/10/20 270 lb (122.5 kg)  01/14/20 280 lb (127 kg)     Health Maintenance Due  Topic Date Due  . COVID-19 Vaccine (1) Never done    There are no preventive care reminders to display for this patient.  Lab Results  Component Value Date   TSH 1.330 02/22/2019   Lab Results  Component Value Date   WBC 10.4 10/30/2019   HGB 12.2 10/30/2019   HCT 37.6 10/30/2019   MCV 90.8 10/30/2019   PLT 361 10/30/2019   Lab Results  Component Value Date   NA 137 10/30/2019   K 3.7 10/30/2019   CO2 21 (L) 10/30/2019   GLUCOSE 82 10/30/2019   BUN 7 10/30/2019   CREATININE 0.59 10/30/2019   BILITOT 0.6 10/30/2019   ALKPHOS 59 10/30/2019   AST 17 10/30/2019   ALT 16 10/30/2019   PROT 7.7 10/30/2019   ALBUMIN 4.0 10/30/2019   CALCIUM 9.3 10/30/2019   ANIONGAP 11 10/30/2019   Lab Results  Component Value Date   CHOL 160 09/05/2017   Lab Results  Component Value Date   HDL 56 09/05/2017   Lab Results  Component Value Date   LDLCALC 77 09/05/2017   Lab Results  Component Value Date   TRIG 137 09/05/2017   Lab Results  Component Value Date   CHOLHDL 2.9 09/05/2017   Lab Results  Component Value Date   HGBA1C 5.4 08/28/2019      Assessment & Plan:   1. Hospital discharge follow-up  2. Tendinitis, de Quervain's Stable today.   3. Primary osteoarthritis of left hip Possible further surgery.   4. Premenopausal  menorrhagia Stable.   5. Anxiety  6. Visual problems Resolved.   7. Morbid obesity ([place])  8. Class 3 severe obesity due to excess calories with serious comorbidity and body mass index (BMI) of 45.0 to 49.9 in adult [facility]) Body mass index is 46.35 kg/m. Goal BMI  is <30. Encouraged efforts to reduce weight include engaging in physical activity as tolerated with goal of 150 minutes per week. Improve dietary choices and eat a meal regimen consistent with a Mediterranean or DASH diet. Reduce simple carbohydrates. Do not skip meals and eat healthy snacks throughout the day to avoid over-eating at dinner. Set a goal weight loss that is achievable for you.  9. Bipolar 1 disorder, depressed, severe ([place])  10. Hypothyroidism, unspecified type - Ambulatory referral to Endocrinology  11. Heart palpitations Stable today.   12. Healthcare maintenance - CBC with Differential - Comprehensive metabolic panel - TSH - Lipid Panel - Vitamin B12 - Vitamin D, 25-hydroxy - Thyroid Panel With TSH - T3, Free - T4, Free  13.  Follow up She will follow up in 6 months.   No orders of the defined types were placed in this encounter.   Orders Placed This Encounter  Procedures  . CBC with Differential  . Comprehensive metabolic panel  . TSH  . Lipid Panel  . Vitamin B12  . Vitamin D, 25-hydroxy  . Thyroid Panel With TSH  . T3, Free  . T4, Free  . Ambulatory referral to Endocrinology     Referral Orders     Ambulatory referral to Endocrinology   Kathe Becton,  MSN, FNP-BC [facility] [place], [place] [address] [phone] fax  Problem List Items Addressed This Visit      Endocrine   Hypothyroidism   Relevant Orders   Ambulatory referral to Endocrinology     Musculoskeletal and Integument   Primary osteoarthritis of left hip     Other   Anxiety   Bipolar 1  disorder, depressed, severe ([place])   Morbid obesity ([place])   Visual problems    Other Visit Diagnoses    Hospital discharge follow-up    -  Primary   Tendinitis, de Quervain's       Premenopausal menorrhagia       Class 3 severe obesity due to excess calories with serious comorbidity and body mass index (BMI) of 45.0 to 49.9 in adult ([place])       Heart palpitations       Healthcare maintenance       Relevant Orders   CBC with Differential   Comprehensive metabolic panel   TSH   Lipid Panel   Vitamin B12   Vitamin D, 25-hydroxy   Thyroid Panel With TSH   T3, Free   T4, Free   Follow up          No orders of the defined types were placed in this encounter.   Follow-up: No follow-ups on file.    Azzie Glatter, FNP

## 2020-03-14 LAB — CBC WITH DIFFERENTIAL/PLATELET
Basophils Absolute: 0.1 10*3/uL (ref 0.0–0.2)
Basos: 1 %
EOS (ABSOLUTE): 0.3 10*3/uL (ref 0.0–0.4)
Eos: 3 %
Hematocrit: 36.1 % (ref 34.0–46.6)
Hemoglobin: 12.1 g/dL (ref 11.1–15.9)
Immature Grans (Abs): 0 10*3/uL (ref 0.0–0.1)
Immature Granulocytes: 0 %
Lymphocytes Absolute: 2.5 10*3/uL (ref 0.7–3.1)
Lymphs: 32 %
MCH: 29.6 pg (ref 26.6–33.0)
MCHC: 33.5 g/dL (ref 31.5–35.7)
MCV: 88 fL (ref 79–97)
Monocytes Absolute: 0.4 10*3/uL (ref 0.1–0.9)
Monocytes: 6 %
Neutrophils Absolute: 4.4 10*3/uL (ref 1.4–7.0)
Neutrophils: 58 %
Platelets: 355 10*3/uL (ref 150–450)
RBC: 4.09 x10E6/uL (ref 3.77–5.28)
RDW: 13 % (ref 11.7–15.4)
WBC: 7.6 10*3/uL (ref 3.4–10.8)

## 2020-03-14 LAB — COMPREHENSIVE METABOLIC PANEL
ALT: 13 IU/L (ref 0–32)
AST: 10 IU/L (ref 0–40)
Albumin/Globulin Ratio: 1.9 (ref 1.2–2.2)
Albumin: 4.5 g/dL (ref 3.8–4.8)
Alkaline Phosphatase: 73 IU/L (ref 48–121)
BUN/Creatinine Ratio: 17 (ref 9–23)
BUN: 12 mg/dL (ref 6–24)
Bilirubin Total: <0.2 mg/dL (ref 0.0–1.2)
CO2: 20 mmol/L (ref 20–29)
Calcium: 9.7 mg/dL (ref 8.7–10.2)
Chloride: 104 mmol/L (ref 96–106)
Creatinine, Ser: 0.69 mg/dL (ref 0.57–1.00)
GFR calc Af Amer: 122 mL/min/{1.73_m2} (ref >59)
GFR calc non Af Amer: 105 mL/min/{1.73_m2} (ref >59)
Globulin, Total: 2.4 g/dL (ref 1.5–4.5)
Glucose: 124 mg/dL — ABNORMAL HIGH (ref 65–99)
Potassium: 4.5 mmol/L (ref 3.5–5.2)
Sodium: 139 mmol/L (ref 134–144)
Total Protein: 6.9 g/dL (ref 6.0–8.5)

## 2020-03-14 LAB — LIPID PANEL
Chol/HDL Ratio: 3.9 ratio (ref 0.0–4.4)
Cholesterol, Total: 157 mg/dL (ref 100–199)
HDL: 40 mg/dL (ref >39)
LDL Chol Calc (NIH): 90 mg/dL (ref 0–99)
Triglycerides: 152 mg/dL — ABNORMAL HIGH (ref 0–149)
VLDL Cholesterol Cal: 27 mg/dL (ref 5–40)

## 2020-03-14 LAB — T4, FREE: Free T4: 1.05 ng/dL (ref 0.82–1.77)

## 2020-03-14 LAB — THYROID PANEL WITH TSH
Free Thyroxine Index: 1.9 (ref 1.2–4.9)
T3 Uptake Ratio: 25 % (ref 24–39)
T4, Total: 7.7 ug/dL (ref 4.5–12.0)
TSH: 1.3 u[IU]/mL (ref 0.450–4.500)

## 2020-03-14 LAB — VITAMIN B12: Vitamin B-12: 706 pg/mL (ref 232–1245)

## 2020-03-14 LAB — T3, FREE: T3, Free: 2.9 pg/mL (ref 2.0–4.4)

## 2020-03-14 LAB — VITAMIN D 25 HYDROXY (VIT D DEFICIENCY, FRACTURES): Vit D, 25-Hydroxy: 19.1 ng/mL — ABNORMAL LOW (ref 30.0–100.0)

## 2020-03-16 ENCOUNTER — Encounter: Payer: Self-pay | Admitting: Family Medicine

## 2020-03-17 ENCOUNTER — Encounter: Payer: Self-pay | Admitting: Family Medicine

## 2020-03-17 NOTE — Progress Notes (Signed)
Patient notified of results; no additional questions.

## 2020-03-26 ENCOUNTER — Other Ambulatory Visit: Payer: Self-pay | Admitting: Family Medicine

## 2020-03-26 DIAGNOSIS — Z8719 Personal history of other diseases of the digestive system: Secondary | ICD-10-CM

## 2020-03-27 MED FILL — ?CITALOPRAM HBR 20 MG TABLE: 20 | 30 days supply | Qty: 30 | Fill #1

## 2020-04-06 ENCOUNTER — Telehealth: Payer: Self-pay

## 2020-04-06 ENCOUNTER — Ambulatory Visit: Payer: Medicaid Other | Admitting: Gastroenterology

## 2020-04-06 NOTE — Telephone Encounter (Signed)
-----   Message from Azzie Glatter, [place] sent at 03/26/2020 10:35 PM EDT ----- Regarding: "Dental Clinic" Referral sent for [facility]. Please inform patient.

## 2020-04-06 NOTE — Telephone Encounter (Signed)
Pt was called @ 1:00pm  per NP Lanelle Bal to let pt know a referral was sent to [facility].

## 2020-04-07 ENCOUNTER — Telehealth: Payer: Self-pay | Admitting: Orthopaedic Surgery

## 2020-04-07 NOTE — Telephone Encounter (Signed)
Patient called.   She said she is swelling in her hip and it's causing pain and instability. I told her the next available is August 3rd but she wanted to know if she could be worked in anytime sooner   Call back: [phone]

## 2020-04-09 NOTE — Telephone Encounter (Signed)
I called and worked patient in for an appointment on 04/14/2020.

## 2020-04-14 ENCOUNTER — Ambulatory Visit (INDEPENDENT_AMBULATORY_CARE_PROVIDER_SITE_OTHER): Payer: Self-pay | Admitting: Orthopaedic Surgery

## 2020-04-14 ENCOUNTER — Encounter: Payer: Self-pay | Admitting: Orthopaedic Surgery

## 2020-04-14 VITALS — Ht 64.0 in | Wt 270.0 lb

## 2020-04-14 DIAGNOSIS — M1611 Unilateral primary osteoarthritis, right hip: Secondary | ICD-10-CM
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
## 2020-04-16 ENCOUNTER — Encounter: Payer: Self-pay | Admitting: Orthopaedic Surgery

## 2020-04-17 ENCOUNTER — Ambulatory Visit: Payer: Self-pay

## 2020-04-17 DIAGNOSIS — M1611 Unilateral primary osteoarthritis, right hip: Secondary | ICD-10-CM

## 2020-04-20 ENCOUNTER — Telehealth: Payer: Self-pay | Admitting: Orthopaedic Surgery

## 2020-04-20 NOTE — Telephone Encounter (Signed)
Patient calling regarding right hip surgery scheduled for 04-27-20 at [facility]. She would like to know what services are available for recovery (she has [employer] ).  She is asking if Physical Therapy will be prescribed.  If so when will this take place.  She has made arrangements to sleep downstairs so she doesn't have to use the stairs.  She has a commode chair and walker.  Is there anything else to cover in regards to surgery?   [phone]

## 2020-04-21 NOTE — Telephone Encounter (Signed)
Pls advise.  

## 2020-04-21 NOTE — Telephone Encounter (Signed)
Should not need PT, just walk daily at home with walker. [facility] thanks

## 2020-04-21 NOTE — Telephone Encounter (Signed)
Spoke with patient and advised

## 2020-04-22 ENCOUNTER — Ambulatory Visit (INDEPENDENT_AMBULATORY_CARE_PROVIDER_SITE_OTHER): Payer: Self-pay | Admitting: Surgery

## 2020-04-22 ENCOUNTER — Ambulatory Visit: Payer: Self-pay

## 2020-04-22 ENCOUNTER — Encounter (INDEPENDENT_AMBULATORY_CARE_PROVIDER_SITE_OTHER): Payer: Self-pay

## 2020-04-22 ENCOUNTER — Encounter: Payer: Self-pay | Admitting: Surgery

## 2020-04-22 VITALS — BP 141/93 | HR 70 | Ht 65.0 in | Wt 272.4 lb

## 2020-04-22 DIAGNOSIS — M1611 Unilateral primary osteoarthritis, right hip: Secondary | ICD-10-CM

## 2020-04-22 MED FILL — ?CITALOPRAM HBR 20 MG TABLE: 20 | 30 days supply | Qty: 30 | Fill #2

## 2020-04-22 NOTE — Progress Notes (Signed)
44 year old white female history of end-stage DJD right hip and pain comes in for preop evaluation.  States that hip symptoms unchanged from previous visit and she is wanting to proceed with right total hip replacement as scheduled.  Today history and physical performed.  We are awaiting preop medical clearance.  Patient had a colonoscopy April 2021 and this showed diverticulosis and she had 2 polyps removed.  States that she continues to have some abdominal pain.  She has not yet followed up with GI specialist.  She denies currently nausea vomiting diarrhea bloody stools.  Surgery procedure discussed along with  potential hospital stay.  All questions answered.  With her abdominal pain and colonoscopy results we would have to figure out what to safely put patient on for postop DVT prophylaxis.   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  She has also concerned about how much help she will get there in the early stages of her postop recovery.  Patient currently lives at a home for recovery and she will not have anybody to help monitor and dispense her postop narcotics.  She is also worried as to whether or not staff will be available at the home to assist her after surgery.  I did discuss with patient the option of going to a skilled nurse facility for at least a couple of weeks which may be in her best interest for both of her concerns.  Social worker can discuss this with her in further detail after surgery.  All questions answered regarding this.

## 2020-04-23 ENCOUNTER — Other Ambulatory Visit (HOSPITAL_COMMUNITY)
Admission: RE | Admit: 2020-04-23 | Discharge: 2020-04-23 | Disposition: A | Discharge status: Home / Self Care | Payer: Medicaid Other | Source: Ambulatory Visit | Attending: Orthopaedic Surgery | Admitting: Orthopaedic Surgery

## 2020-04-23 ENCOUNTER — Encounter (INDEPENDENT_AMBULATORY_CARE_PROVIDER_SITE_OTHER): Payer: Self-pay

## 2020-04-23 ENCOUNTER — Ambulatory Visit: Payer: Self-pay | Admitting: "Endocrinology

## 2020-04-23 DIAGNOSIS — Z20822 Contact with and (suspected) exposure to covid-19: Secondary | ICD-10-CM | POA: Diagnosis not present

## 2020-04-23 DIAGNOSIS — Z01812 Encounter for preprocedural laboratory examination: Secondary | ICD-10-CM | POA: Insufficient documentation

## 2020-04-23 LAB — SARS CORONAVIRUS 2 (TAT 6-24 HRS): SARS Coronavirus 2: NEGATIVE

## 2020-04-23 MED FILL — DICLOFENAC SODIUM 1% GEL: 1 | 6 days supply | Qty: 100 | Fill #2

## 2020-04-24 ENCOUNTER — Encounter (HOSPITAL_COMMUNITY): Payer: Self-pay | Admitting: Orthopaedic Surgery

## 2020-04-24 ENCOUNTER — Other Ambulatory Visit: Payer: Self-pay

## 2020-04-24 ENCOUNTER — Telehealth: Payer: Self-pay | Admitting: Family Medicine

## 2020-04-24 NOTE — Telephone Encounter (Signed)
Returned Pt call, LVM inform that her CAFA does not expired until 05/21/20 but if she has any question please to call me back
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
## 2020-04-24 NOTE — Telephone Encounter (Signed)
Copied from [place] [phone]. Topic: General - Other >> Apr 21, 2020  2:02 PM Rainey Pines A wrote: Patient is requesting a callback from [facility] in regards to her orange card

## 2020-04-24 NOTE — Progress Notes (Signed)
Pt denies SOB, chest pain, and being under the care of a cardiologist. Pt stated that she is under the care of  Kathe Becton, NP, PCP and Roseanne Kaufman, NP, Gertie [facility]. Pt denies having a stress test, echo and cardiac cath. Pt denies recent labs. Pt made aware to stop taking  Aspirin (unless otherwise advised by surgeon), vitamins, fish oil, Hemp, Melatonin, CBD oil, and herbal medications. Do not take any NSAIDs ie: Ibuprofen, Advil, Naproxen (Aleve), Motrin, BC and Goody Powder. Pt reminded to quarantine. Pt verbalized understanding of all pre-op instructions. PA, Anesthesiology, asked to review pt history.

## 2020-04-27 ENCOUNTER — Encounter (HOSPITAL_COMMUNITY)
Admission: RE | Disposition: A | Discharge status: Home / Self Care | Payer: Self-pay | Source: Home / Self Care | Attending: Orthopaedic Surgery

## 2020-04-27 ENCOUNTER — Ambulatory Visit (HOSPITAL_COMMUNITY): Payer: Self-pay

## 2020-04-27 ENCOUNTER — Ambulatory Visit (HOSPITAL_COMMUNITY): Payer: Self-pay | Admitting: Physician Assistant

## 2020-04-27 ENCOUNTER — Observation Stay (HOSPITAL_COMMUNITY): Payer: Self-pay

## 2020-04-27 ENCOUNTER — Inpatient Hospital Stay (HOSPITAL_COMMUNITY)
Admission: RE | Admit: 2020-04-27 | Discharge: 2020-04-30 | DRG: 470 | Disposition: A | Discharge status: Home / Self Care | Payer: Self-pay | Attending: Orthopaedic Surgery | Admitting: Orthopaedic Surgery

## 2020-04-27 ENCOUNTER — Encounter (HOSPITAL_COMMUNITY): Payer: Self-pay | Admitting: Orthopaedic Surgery

## 2020-04-27 DIAGNOSIS — Z800 Family history of malignant neoplasm of digestive organs: Secondary | ICD-10-CM

## 2020-04-27 DIAGNOSIS — F1011 Alcohol abuse, in remission: Secondary | ICD-10-CM | POA: Diagnosis present

## 2020-04-27 DIAGNOSIS — Z8249 Family history of ischemic heart disease and other diseases of the circulatory system: Secondary | ICD-10-CM

## 2020-04-27 DIAGNOSIS — M1611 Unilateral primary osteoarthritis, right hip: Principal | ICD-10-CM | POA: Diagnosis present

## 2020-04-27 DIAGNOSIS — Z09 Encounter for follow-up examination after completed treatment for conditions other than malignant neoplasm: Secondary | ICD-10-CM

## 2020-04-27 DIAGNOSIS — Z79890 Hormone replacement therapy (postmenopausal): Secondary | ICD-10-CM

## 2020-04-27 DIAGNOSIS — F319 Bipolar disorder, unspecified: Secondary | ICD-10-CM | POA: Diagnosis present

## 2020-04-27 DIAGNOSIS — G8929 Other chronic pain: Secondary | ICD-10-CM | POA: Diagnosis present

## 2020-04-27 DIAGNOSIS — Z6841 Body Mass Index (BMI) 40.0 and over, adult: Secondary | ICD-10-CM

## 2020-04-27 DIAGNOSIS — J449 Chronic obstructive pulmonary disease, unspecified: Secondary | ICD-10-CM | POA: Diagnosis present

## 2020-04-27 DIAGNOSIS — Z79899 Other long term (current) drug therapy: Secondary | ICD-10-CM

## 2020-04-27 DIAGNOSIS — Z833 Family history of diabetes mellitus: Secondary | ICD-10-CM

## 2020-04-27 DIAGNOSIS — Z96641 Presence of right artificial hip joint: Secondary | ICD-10-CM

## 2020-04-27 DIAGNOSIS — K219 Gastro-esophageal reflux disease without esophagitis: Secondary | ICD-10-CM | POA: Diagnosis present

## 2020-04-27 DIAGNOSIS — I10 Essential (primary) hypertension: Secondary | ICD-10-CM | POA: Diagnosis present

## 2020-04-27 DIAGNOSIS — F17290 Nicotine dependence, other tobacco product, uncomplicated: Secondary | ICD-10-CM | POA: Diagnosis present

## 2020-04-27 DIAGNOSIS — Z8262 Family history of osteoporosis: Secondary | ICD-10-CM

## 2020-04-27 DIAGNOSIS — F419 Anxiety disorder, unspecified: Secondary | ICD-10-CM | POA: Diagnosis present

## 2020-04-27 DIAGNOSIS — E039 Hypothyroidism, unspecified: Secondary | ICD-10-CM | POA: Diagnosis present

## 2020-04-27 DIAGNOSIS — M5430 Sciatica, unspecified side: Secondary | ICD-10-CM | POA: Diagnosis present

## 2020-04-27 DIAGNOSIS — Z87891 Personal history of nicotine dependence: Secondary | ICD-10-CM

## 2020-04-27 DIAGNOSIS — Z20822 Contact with and (suspected) exposure to covid-19: Secondary | ICD-10-CM | POA: Diagnosis present

## 2020-04-27 DIAGNOSIS — Z419 Encounter for procedure for purposes other than remedying health state, unspecified: Secondary | ICD-10-CM

## 2020-04-27 HISTORY — DX: Unspecified osteoarthritis, unspecified site: M19.90

## 2020-04-27 HISTORY — PX: TOTAL HIP ARTHROPLASTY: SHX124

## 2020-04-27 HISTORY — DX: Bipolar disorder, unspecified: F31.9

## 2020-04-27 LAB — COMPREHENSIVE METABOLIC PANEL
ALT: 13 U/L (ref 0–44)
AST: 30 U/L (ref 15–41)
Albumin: 4 g/dL (ref 3.5–5.0)
Alkaline Phosphatase: 51 U/L (ref 38–126)
Anion gap: 13 (ref 5–15)
BUN: 8 mg/dL (ref 6–20)
CO2: 23 mmol/L (ref 22–32)
Calcium: 9.2 mg/dL (ref 8.9–10.3)
Chloride: 104 mmol/L (ref 98–111)
Creatinine, Ser: 0.6 mg/dL (ref 0.44–1.00)
GFR calc Af Amer: >60 mL/min (ref >60)
GFR calc non Af Amer: >60 mL/min (ref >60)
Glucose, Bld: 98 mg/dL (ref 70–99)
Potassium: 4.6 mmol/L (ref 3.5–5.1)
Sodium: 140 mmol/L (ref 135–145)
Total Bilirubin: 1.1 mg/dL (ref 0.3–1.2)
Total Protein: 6.8 g/dL (ref 6.5–8.1)

## 2020-04-27 LAB — POCT PREGNANCY, URINE: Preg Test, Ur: NEGATIVE

## 2020-04-27 LAB — CBC
HCT: 36.6 % (ref 36.0–46.0)
Hemoglobin: 11.6 g/dL — ABNORMAL LOW (ref 12.0–15.0)
MCH: 28.7 pg (ref 26.0–34.0)
MCHC: 31.7 g/dL (ref 30.0–36.0)
MCV: 90.6 fL (ref 80.0–100.0)
Platelets: 319 10*3/uL (ref 150–400)
RBC: 4.04 MIL/uL (ref 3.87–5.11)
RDW: 13.5 % (ref 11.5–15.5)
WBC: 8.1 10*3/uL (ref 4.0–10.5)
nRBC: 0 % (ref 0.0–0.2)

## 2020-04-27 LAB — TYPE AND SCREEN
ABO/RH(D): O NEG
Antibody Screen: NEGATIVE

## 2020-04-27 LAB — SURGICAL PCR SCREEN
MRSA, PCR: NEGATIVE
Staphylococcus aureus: NEGATIVE

## 2020-04-27 LAB — ABO/RH: ABO/RH(D): O NEG

## 2020-04-27 SURGERY — ARTHROPLASTY, HIP, TOTAL, ANTERIOR APPROACH
Anesthesia: General | Site: Hip | Laterality: Right

## 2020-04-27 MED ORDER — PROPOFOL 10 MG/ML IV BOLUS
INTRAVENOUS | Status: DC | PRN
  Administered 2020-04-27: 200 mg via INTRAVENOUS

## 2020-04-27 MED ORDER — PHENYLEPHRINE 40 MCG/ML (10ML) SYRINGE FOR IV PUSH (FOR BLOOD PRESSURE SUPPORT)
PREFILLED_SYRINGE | INTRAVENOUS | Status: DC | PRN
  Administered 2020-04-27: 80 ug via INTRAVENOUS

## 2020-04-27 MED ORDER — SIMETHICONE 80 MG PO CHEW
80.0000 mg | CHEWABLE_TABLET | Freq: Four times a day (QID) | ORAL | Status: DC | PRN
  Filled 2020-04-27: qty 1

## 2020-04-27 MED ORDER — ALBUMIN HUMAN 5 % IV SOLN
INTRAVENOUS | Status: DC | PRN
Start: 2020-04-27 — End: 2020-04-27

## 2020-04-27 MED ORDER — DOCUSATE SODIUM 100 MG PO CAPS
100.0000 mg | ORAL_CAPSULE | Freq: Two times a day (BID) | ORAL | Status: DC
  Administered 2020-04-27 – 2020-04-30 (×6): 100 mg via ORAL
  Filled 2020-04-27 (×6): qty 1

## 2020-04-27 MED ORDER — FENTANYL CITRATE (PF) 250 MCG/5ML IJ SOLN
INTRAMUSCULAR | Status: AC
  Filled 2020-04-27: qty 5

## 2020-04-27 MED ORDER — OXYCODONE HCL 5 MG PO TABS
ORAL_TABLET | ORAL | Status: AC
  Administered 2020-04-27: 5 mg via ORAL
  Filled 2020-04-27: qty 1

## 2020-04-27 MED ORDER — KETOROLAC TROMETHAMINE 30 MG/ML IJ SOLN: 30.0000 mg | Freq: Once | INTRAMUSCULAR | Status: AC | PRN

## 2020-04-27 MED ORDER — HYDROMORPHONE HCL 1 MG/ML IJ SOLN
INTRAMUSCULAR | Status: AC
  Filled 2020-04-27: qty 1

## 2020-04-27 MED ORDER — CHLORHEXIDINE GLUCONATE 0.12 % MT SOLN
OROMUCOSAL | Status: AC
  Administered 2020-04-27: 15 mL via OROMUCOSAL
  Filled 2020-04-27: qty 15

## 2020-04-27 MED ORDER — MENTHOL 3 MG MT LOZG: 1.0000 | LOZENGE | OROMUCOSAL | Status: DC | PRN

## 2020-04-27 MED ORDER — ROCURONIUM BROMIDE 10 MG/ML (PF) SYRINGE
PREFILLED_SYRINGE | INTRAVENOUS | Status: AC
  Filled 2020-04-27: qty 10

## 2020-04-27 MED ORDER — ONDANSETRON HCL 4 MG/2ML IJ SOLN
INTRAMUSCULAR | Status: DC | PRN
  Administered 2020-04-27: 4 mg via INTRAVENOUS

## 2020-04-27 MED ORDER — TRANEXAMIC ACID 1000 MG/10ML IV SOLN
2000.0000 mg | Freq: Once | INTRAVENOUS | Status: DC
  Filled 2020-04-27: qty 20

## 2020-04-27 MED ORDER — HYDRALAZINE HCL 20 MG/ML IJ SOLN
INTRAMUSCULAR | Status: DC | PRN
  Administered 2020-04-27: 2 mg via INTRAVENOUS
  Administered 2020-04-27: 4 mg via INTRAVENOUS

## 2020-04-27 MED ORDER — PHENOL 1.4 % MT LIQD: 1.0000 | OROMUCOSAL | Status: DC | PRN

## 2020-04-27 MED ORDER — LACTATED RINGERS IV SOLN: INTRAVENOUS | Status: DC | PRN

## 2020-04-27 MED ORDER — MIDAZOLAM HCL 2 MG/2ML IJ SOLN
INTRAMUSCULAR | Status: AC
  Filled 2020-04-27: qty 2

## 2020-04-27 MED ORDER — SODIUM CHLORIDE 0.9 % IV SOLN: INTRAVENOUS | Status: DC

## 2020-04-27 MED ORDER — METOPROLOL TARTRATE 50 MG PO TABS
50.0000 mg | ORAL_TABLET | Freq: Two times a day (BID) | ORAL | Status: DC
  Administered 2020-04-27 – 2020-04-30 (×6): 50 mg via ORAL
  Filled 2020-04-27 (×2): qty 1
  Filled 2020-04-27 (×2): qty 2
  Filled 2020-04-27 (×2): qty 1
  Filled 2020-04-27: qty 2
  Filled 2020-04-27 (×3): qty 1
  Filled 2020-04-27: qty 2

## 2020-04-27 MED ORDER — LACTATED RINGERS IV SOLN: INTRAVENOUS | Status: DC

## 2020-04-27 MED ORDER — DEXAMETHASONE SODIUM PHOSPHATE 10 MG/ML IJ SOLN
INTRAMUSCULAR | Status: AC
  Filled 2020-04-27: qty 1

## 2020-04-27 MED ORDER — 0.9 % SODIUM CHLORIDE (POUR BTL) OPTIME
TOPICAL | Status: DC | PRN
Start: 2020-04-27 — End: 2020-04-27
  Administered 2020-04-27: 1000 mL

## 2020-04-27 MED ORDER — MEPERIDINE HCL 25 MG/ML IJ SOLN: 6.2500 mg | INTRAMUSCULAR | Status: DC | PRN

## 2020-04-27 MED ORDER — OXYCODONE HCL 5 MG/5ML PO SOLN: 5.0000 mg | Freq: Once | ORAL | Status: DC | PRN

## 2020-04-27 MED ORDER — HYDROMORPHONE HCL 1 MG/ML IJ SOLN: 0.5000 mg | INTRAMUSCULAR | Status: DC | PRN

## 2020-04-27 MED ORDER — ONDANSETRON HCL 4 MG/2ML IJ SOLN: 4.0000 mg | Freq: Four times a day (QID) | INTRAMUSCULAR | Status: DC | PRN

## 2020-04-27 MED ORDER — METHOCARBAMOL 500 MG PO TABS
ORAL_TABLET | ORAL | Status: AC
  Administered 2020-04-27: 500 mg via ORAL
  Filled 2020-04-27: qty 1

## 2020-04-27 MED ORDER — BUPIVACAINE LIPOSOME 1.3 % IJ SUSP
10.0000 mL | Freq: Once | INTRAMUSCULAR | Status: DC
Start: 2020-04-27 — End: 2020-04-27

## 2020-04-27 MED ORDER — ASPIRIN EC 325 MG PO TBEC
325.0000 mg | DELAYED_RELEASE_TABLET | Freq: Every day | ORAL | Status: DC
  Administered 2020-04-28 – 2020-04-30 (×3): 325 mg via ORAL
  Filled 2020-04-27 (×3): qty 1

## 2020-04-27 MED ORDER — ONDANSETRON HCL 4 MG PO TABS: 4.0000 mg | ORAL_TABLET | Freq: Four times a day (QID) | ORAL | Status: DC | PRN

## 2020-04-27 MED ORDER — METHOCARBAMOL 1000 MG/10ML IJ SOLN
500.0000 mg | Freq: Four times a day (QID) | INTRAVENOUS | Status: DC | PRN
  Filled 2020-04-27: qty 5

## 2020-04-27 MED ORDER — ACETAMINOPHEN 325 MG PO TABS
325.0000 mg | ORAL_TABLET | Freq: Four times a day (QID) | ORAL | Status: DC | PRN
  Administered 2020-04-27 – 2020-04-29 (×6): 650 mg via ORAL
  Administered 2020-04-29: 325 mg via ORAL
  Administered 2020-04-30 (×2): 650 mg via ORAL
  Filled 2020-04-27 (×2): qty 2
  Filled 2020-04-27: qty 1
  Filled 2020-04-27 (×6): qty 2

## 2020-04-27 MED ORDER — PROPOFOL 10 MG/ML IV BOLUS
INTRAVENOUS | Status: AC
  Filled 2020-04-27: qty 40

## 2020-04-27 MED ORDER — DEXAMETHASONE SODIUM PHOSPHATE 10 MG/ML IJ SOLN
INTRAMUSCULAR | Status: DC | PRN
  Administered 2020-04-27: 10 mg via INTRAVENOUS

## 2020-04-27 MED ORDER — SUGAMMADEX SODIUM 200 MG/2ML IV SOLN
INTRAVENOUS | Status: DC | PRN
  Administered 2020-04-27: 200 mg via INTRAVENOUS

## 2020-04-27 MED ORDER — BUPIVACAINE-EPINEPHRINE (PF) 0.25% -1:200000 IJ SOLN
INTRAMUSCULAR | Status: AC
  Filled 2020-04-27: qty 10

## 2020-04-27 MED ORDER — ACETAMINOPHEN 500 MG PO TABS
1000.0000 mg | ORAL_TABLET | Freq: Once | ORAL | Status: AC
  Administered 2020-04-27: 1000 mg via ORAL
  Filled 2020-04-27: qty 2

## 2020-04-27 MED ORDER — HYDROMORPHONE HCL 1 MG/ML IJ SOLN
0.2500 mg | INTRAMUSCULAR | Status: DC | PRN
  Administered 2020-04-27 (×2): 0.5 mg via INTRAVENOUS

## 2020-04-27 MED ORDER — CEFAZOLIN SODIUM-DEXTROSE 2-4 GM/100ML-% IV SOLN
2.0000 g | INTRAVENOUS | Status: AC
  Administered 2020-04-27: 2 g via INTRAVENOUS

## 2020-04-27 MED ORDER — ORAL CARE MOUTH RINSE: 15.0000 mL | Freq: Once | OROMUCOSAL | Status: AC

## 2020-04-27 MED ORDER — PANTOPRAZOLE SODIUM 40 MG PO TBEC
80.0000 mg | DELAYED_RELEASE_TABLET | Freq: Every day | ORAL | Status: DC
  Administered 2020-04-28 – 2020-04-30 (×3): 80 mg via ORAL
  Filled 2020-04-27 (×3): qty 2

## 2020-04-27 MED ORDER — METHOCARBAMOL 500 MG PO TABS
500.0000 mg | ORAL_TABLET | Freq: Four times a day (QID) | ORAL | Status: DC | PRN
  Administered 2020-04-27 – 2020-04-30 (×8): 500 mg via ORAL
  Filled 2020-04-27 (×8): qty 1

## 2020-04-27 MED ORDER — TRANEXAMIC ACID 1000 MG/10ML IV SOLN
INTRAVENOUS | Status: DC | PRN
  Administered 2020-04-27: 2000 mg via TOPICAL

## 2020-04-27 MED ORDER — BUPIVACAINE LIPOSOME 1.3 % IJ SUSP
INTRAMUSCULAR | Status: DC | PRN
  Administered 2020-04-27: 10 mL

## 2020-04-27 MED ORDER — FENTANYL CITRATE (PF) 100 MCG/2ML IJ SOLN
INTRAMUSCULAR | Status: AC
  Filled 2020-04-27: qty 2

## 2020-04-27 MED ORDER — OXYCODONE HCL 5 MG PO TABS: 5.0000 mg | ORAL_TABLET | Freq: Once | ORAL | Status: DC | PRN

## 2020-04-27 MED ORDER — LIDOCAINE 2% (20 MG/ML) 5 ML SYRINGE
INTRAMUSCULAR | Status: AC
  Filled 2020-04-27: qty 5

## 2020-04-27 MED ORDER — SODIUM CHLORIDE (PF) 0.9 % IJ SOLN
INTRAMUSCULAR | Status: AC
  Filled 2020-04-27: qty 10

## 2020-04-27 MED ORDER — ROCURONIUM BROMIDE 10 MG/ML (PF) SYRINGE
PREFILLED_SYRINGE | INTRAVENOUS | Status: DC | PRN
  Administered 2020-04-27: 20 mg via INTRAVENOUS
  Administered 2020-04-27: 60 mg via INTRAVENOUS

## 2020-04-27 MED ORDER — CHLORHEXIDINE GLUCONATE 0.12 % MT SOLN: 15.0000 mL | Freq: Once | OROMUCOSAL | Status: AC

## 2020-04-27 MED ORDER — PROMETHAZINE HCL 25 MG/ML IJ SOLN: 6.2500 mg | INTRAMUSCULAR | Status: DC | PRN

## 2020-04-27 MED ORDER — HYDROMORPHONE HCL 1 MG/ML IJ SOLN
INTRAMUSCULAR | Status: DC | PRN
  Administered 2020-04-27 (×2): .5 mg via INTRAVENOUS

## 2020-04-27 MED ORDER — BUPIVACAINE-EPINEPHRINE 0.25% -1:200000 IJ SOLN
INTRAMUSCULAR | Status: DC | PRN
  Administered 2020-04-27: 10 mL

## 2020-04-27 MED ORDER — KETOROLAC TROMETHAMINE 30 MG/ML IJ SOLN
INTRAMUSCULAR | Status: AC
  Administered 2020-04-27: 30 mg via INTRAVENOUS
  Filled 2020-04-27: qty 1

## 2020-04-27 MED ORDER — LORATADINE 10 MG PO TABS
10.0000 mg | ORAL_TABLET | Freq: Every day | ORAL | Status: DC
  Administered 2020-04-28 – 2020-04-30 (×3): 10 mg via ORAL
  Filled 2020-04-27 (×3): qty 1

## 2020-04-27 MED ORDER — CITALOPRAM HYDROBROMIDE 20 MG PO TABS
20.0000 mg | ORAL_TABLET | Freq: Every day | ORAL | Status: DC
  Administered 2020-04-28 – 2020-04-30 (×3): 20 mg via ORAL
  Filled 2020-04-27 (×3): qty 1

## 2020-04-27 MED ORDER — HYDRALAZINE HCL 20 MG/ML IJ SOLN
INTRAMUSCULAR | Status: AC
  Filled 2020-04-27: qty 1

## 2020-04-27 MED ORDER — DEXMEDETOMIDINE (PRECEDEX) IN NS 20 MCG/5ML (4 MCG/ML) IV SYRINGE
PREFILLED_SYRINGE | INTRAVENOUS | Status: DC | PRN
  Administered 2020-04-27: 12 ug via INTRAVENOUS
  Administered 2020-04-27: 8 ug via INTRAVENOUS

## 2020-04-27 MED ORDER — CEFAZOLIN SODIUM-DEXTROSE 2-4 GM/100ML-% IV SOLN
INTRAVENOUS | Status: AC
  Filled 2020-04-27: qty 100

## 2020-04-27 MED ORDER — OXYCODONE HCL 5 MG PO TABS
5.0000 mg | ORAL_TABLET | ORAL | Status: DC | PRN
  Administered 2020-04-27 – 2020-04-30 (×17): 5 mg via ORAL
  Filled 2020-04-27 (×17): qty 1

## 2020-04-27 MED ORDER — FENTANYL CITRATE (PF) 250 MCG/5ML IJ SOLN
INTRAMUSCULAR | Status: DC | PRN
  Administered 2020-04-27: 50 ug via INTRAVENOUS
  Administered 2020-04-27 (×2): 100 ug via INTRAVENOUS
  Administered 2020-04-27 (×3): 50 ug via INTRAVENOUS

## 2020-04-27 MED ORDER — ONDANSETRON HCL 4 MG/2ML IJ SOLN
INTRAMUSCULAR | Status: AC
  Filled 2020-04-27: qty 2

## 2020-04-27 MED ORDER — METOCLOPRAMIDE HCL 5 MG/ML IJ SOLN: 5.0000 mg | Freq: Three times a day (TID) | INTRAMUSCULAR | Status: DC | PRN

## 2020-04-27 MED ORDER — LEVOTHYROXINE SODIUM 25 MCG PO TABS
25.0000 ug | ORAL_TABLET | Freq: Every day | ORAL | Status: DC
  Administered 2020-04-28 – 2020-04-30 (×3): 25 ug via ORAL
  Filled 2020-04-27 (×3): qty 1

## 2020-04-27 MED ORDER — ALBUTEROL SULFATE HFA 108 (90 BASE) MCG/ACT IN AERS: 1.0000 | INHALATION_SPRAY | Freq: Four times a day (QID) | RESPIRATORY_TRACT | Status: DC | PRN

## 2020-04-27 MED ORDER — METOCLOPRAMIDE HCL 5 MG PO TABS: 5.0000 mg | ORAL_TABLET | Freq: Three times a day (TID) | ORAL | Status: DC | PRN

## 2020-04-27 MED ORDER — MIDAZOLAM HCL 5 MG/5ML IJ SOLN
INTRAMUSCULAR | Status: DC | PRN
  Administered 2020-04-27: 2 mg via INTRAVENOUS

## 2020-04-27 SURGICAL SUPPLY — 54 items
ARTICULEZE HEAD (Hips) ×3 IMPLANT
BENZOIN TINCTURE PRP APPL 2/3 (GAUZE/BANDAGES/DRESSINGS) ×3 IMPLANT
BLADE SAW SGTL 18X1.27X75 (BLADE) ×2 IMPLANT
BLADE SAW SGTL 18X1.27X75MM (BLADE) ×1
CELLS DAT CNTRL 66122 CELL SVR (MISCELLANEOUS) ×1 IMPLANT
CLOSURE WOUND 1/2 X4 (GAUZE/BANDAGES/DRESSINGS) ×1
COVER SURGICAL LIGHT HANDLE (MISCELLANEOUS) ×3 IMPLANT
COVER WAND RF STERILE (DRAPES) ×3 IMPLANT
DRAPE C-ARM 42X72 X-RAY (DRAPES) ×3 IMPLANT
DRAPE IMP U-DRAPE 54X76 (DRAPES) ×3 IMPLANT
DRAPE STERI IOBAN 125X83 (DRAPES) ×3 IMPLANT
DRAPE U-SHAPE 47X51 STRL (DRAPES) ×9 IMPLANT
DRSG MEPILEX BORDER 4X12 (GAUZE/BANDAGES/DRESSINGS) ×3 IMPLANT
DRSG MEPILEX BORDER 4X8 (GAUZE/BANDAGES/DRESSINGS) ×3 IMPLANT
DURAPREP 26ML APPLICATOR (WOUND CARE) ×3 IMPLANT
ELECT BLADE 4.0 EZ CLEAN MEGAD (MISCELLANEOUS) ×3
ELECT CAUTERY BLADE 6.4 (BLADE) ×3 IMPLANT
ELECT REM PT RETURN 9FT ADLT (ELECTROSURGICAL) ×3
ELECTRODE BLDE 4.0 EZ CLN MEGD (MISCELLANEOUS) ×1 IMPLANT
ELECTRODE REM PT RTRN 9FT ADLT (ELECTROSURGICAL) ×1 IMPLANT
ELIMINATOR HOLE APEX DEPUY (Hips) ×3 IMPLANT
FACESHIELD WRAPAROUND (MASK) ×6 IMPLANT
GLOVE BIOGEL PI IND STRL 8 (GLOVE) ×2 IMPLANT
GLOVE BIOGEL PI INDICATOR 8 (GLOVE) ×4
GLOVE ORTHO TXT STRL SZ7.5 (GLOVE) ×6 IMPLANT
GOWN STRL REUS W/ TWL LRG LVL3 (GOWN DISPOSABLE) ×1 IMPLANT
GOWN STRL REUS W/ TWL XL LVL3 (GOWN DISPOSABLE) ×1 IMPLANT
GOWN STRL REUS W/TWL 2XL LVL3 (GOWN DISPOSABLE) ×3 IMPLANT
GOWN STRL REUS W/TWL LRG LVL3 (GOWN DISPOSABLE) ×3
GOWN STRL REUS W/TWL XL LVL3 (GOWN DISPOSABLE) ×3
HEAD ARTICULEZE (Hips) ×1 IMPLANT
KIT BASIN OR (CUSTOM PROCEDURE TRAY) ×3 IMPLANT
KIT TURNOVER KIT B (KITS) ×3 IMPLANT
LINER NEUTRAL 52X36MM PLUS 4 (Liner) ×3 IMPLANT
MANIFOLD NEPTUNE II (INSTRUMENTS) ×3 IMPLANT
NS IRRIG 1000ML POUR BTL (IV SOLUTION) ×3 IMPLANT
PACK TOTAL JOINT (CUSTOM PROCEDURE TRAY) ×3 IMPLANT
PAD ARMBOARD 7.5X6 YLW CONV (MISCELLANEOUS) ×6 IMPLANT
PIN SECTOR W/GRIP ACE CUP 52MM (Hips) ×3 IMPLANT
RTRCTR WOUND ALEXIS 18CM MED (MISCELLANEOUS) ×3
STAPLER VISISTAT 35W (STAPLE) ×6 IMPLANT
STEM FEM SZ3 STD ACTIS (Stem) ×3 IMPLANT
STRIP CLOSURE SKIN 1/2X4 (GAUZE/BANDAGES/DRESSINGS) ×2 IMPLANT
SUT VIC AB 0 CT1 27 (SUTURE) ×3
SUT VIC AB 0 CT1 27XBRD ANBCTR (SUTURE) ×1 IMPLANT
SUT VIC AB 2-0 CT1 27 (SUTURE) ×3
SUT VIC AB 2-0 CT1 TAPERPNT 27 (SUTURE) ×1 IMPLANT
SUT VICRYL 4-0 PS2 18IN ABS (SUTURE) ×3 IMPLANT
SUT VLOC 180 0 24IN GS25 (SUTURE) ×3 IMPLANT
SYR 30ML LL (SYRINGE) ×3 IMPLANT
TOWEL GREEN STERILE (TOWEL DISPOSABLE) ×6 IMPLANT
TOWEL GREEN STERILE FF (TOWEL DISPOSABLE) ×3 IMPLANT
TRAY FOLEY MTR SLVR 16FR STAT (SET/KITS/TRAYS/PACK) ×3 IMPLANT
WATER STERILE IRR 1000ML POUR (IV SOLUTION) ×6 IMPLANT

## 2020-04-27 NOTE — Anesthesia Preprocedure Evaluation (Addendum)
Anesthesia Evaluation  Patient identified by MRN, date of birth, ID band Patient awake    Reviewed: Allergy & Precautions, NPO status , Patient's Chart, lab work & pertinent test results, reviewed documented beta blocker date and time   History of Anesthesia Complications (+) history of anesthetic complications (emotional wakeup in the past )  Airway Mallampati: I  TM Distance: >3 FB Neck ROM: Full    Dental  (+) Poor Dentition, Dental Advisory Given,    Pulmonary shortness of breath and with exertion, asthma , COPD, former smoker,  Former smoker, quit 2019- 13.5 pack year history    Pulmonary exam normal breath sounds clear to auscultation       Cardiovascular hypertension, Pt. 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, polysubstance 2 years ago   Endo/Other  Hypothyroidism Morbid obesityBMI 45  Renal/GU negative Renal ROS  negative genitourinary   Musculoskeletal  (+) Arthritis , Osteoarthritis,  Right hip OA Chronic back pain, sciatica   Abdominal (+) + obese,   Peds  Hematology negative hematology ROS (+)   Anesthesia Other Findings   Reproductive/Obstetrics negative OB ROS                          Anesthesia Physical Anesthesia Plan  ASA: III  Anesthesia Plan: General   Post-op Pain Management:    Induction: Intravenous  PONV Risk Score and Plan: 3 and Ondansetron, Dexamethasone, Midazolam and Treatment may vary due to age or medical condition  Airway Management Planned: Oral ETT  Additional Equipment: None  Intra-op  Plan:   Post-operative Plan: Extubation in OR  Informed Consent: I have reviewed the patients History and Physical, chart, labs and discussed the procedure including the risks, benefits and alternatives for the proposed anesthesia with the patient or authorized representative who has indicated his/her understanding and acceptance.     Dental advisory given  Plan Discussed with: CRNA  Anesthesia Plan Comments:        Anesthesia Quick Evaluation

## 2020-04-27 NOTE — Anesthesia Postprocedure Evaluation (Signed)
Anesthesia Post Note  Patient: Psychiatric nurse  Procedure(s) Performed: TOTAL HIP ARTHROPLASTY DIRECT ANTERIOR (Right Hip)     Patient location during evaluation: PACU Anesthesia Type: General Level of consciousness: awake and alert, oriented and patient cooperative Pain management: pain level controlled Vital Signs Assessment: post-procedure vital signs reviewed and stable Respiratory status: spontaneous breathing, nonlabored ventilation and respiratory function stable Cardiovascular status: blood pressure returned to baseline and stable Postop Assessment: no apparent nausea or vomiting Anesthetic complications: no   No complications documented.  Last Vitals:  Vitals:   04/27/20 1615 04/27/20 1648  BP: (!) 145/96 (!) 154/91  Pulse: 95 (!) 110  Resp: (!) 21 19  Temp: 36.9 C 36.9 C  SpO2: 92%     Last Pain:  Vitals:   04/27/20 1648  TempSrc: Oral  PainSc:                  Pervis Hocking

## 2020-04-27 NOTE — Interval H&P Note (Signed)
History and Physical Interval Note:  04/27/2020 12:17 PM  Deborah Barnes  has presented today for surgery, with the diagnosis of right hip osteoarthritis.  The various methods of treatment have been discussed with the patient and family. After consideration of risks, benefits and other options for treatment, the patient has consented to  Procedure(s): TOTAL HIP ARTHROPLASTY DIRECT ANTERIOR (Right) as a surgical intervention.  The patient's history has been reviewed, patient examined, no change in status, stable for surgery.  I have reviewed the patient's chart and labs.  Questions were answered to the patient's satisfaction.     Deborah Barnes

## 2020-04-27 NOTE — Anesthesia Procedure Notes (Signed)
Procedure Name: Intubation Date/Time: 04/27/2020 1:02 PM Performed by: Harden Mo, CRNA Pre-anesthesia Checklist: Patient identified, Emergency Drugs available, Suction available and Patient being monitored Patient Re-evaluated:Patient Re-evaluated prior to induction Oxygen Delivery Method: Circle System Utilized Preoxygenation: Pre-oxygenation with 100% oxygen Induction Type: IV induction Ventilation: Mask ventilation without difficulty and Oral airway inserted - appropriate to patient size Laryngoscope Size: Sabra Heck and 2 Grade View: Grade I Tube type: Oral Tube size: 7.5 mm Number of attempts: 1 Airway Equipment and Method: Stylet and Oral airway Placement Confirmation: ETT inserted through vocal cords under direct vision,  positive ETCO2 and breath sounds checked- equal and bilateral Secured at: 22 cm Tube secured with: Tape Dental Injury: Teeth and Oropharynx as per pre-operative assessment

## 2020-04-27 NOTE — Op Note (Signed)
Preop diagnosis: Right hip osteoarthritis postop diagnosis: Same  Procedure: Right total hip arthroplasty.  Surgeon: Rodell Perna, MD  Assistant: Benjiman Core, PA-C medically necessary and present for the entire procedure  Anesthesia General orotracheal.  EBL estimate 300 cc see anesthetic record.  Implants :  Depuy # 3 Actis stem regular offset. Plus 5 metal ball, Gripton Ace 43mm times 36mm poly  Liner. Marland Kitchen Apex hole eliminator.   Procedure: After induction general anesthesia patient had Foley catheter placed.  Hana boots were applied patient placed on the Hana bed careful positioning.  Abdomen was taped over due to her BMI 46.  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  Opposite left hip had normal radiographs no pain and good range of motion.  Patient was able only ambulate with either a walker or crutches and was using a wheelchair.  Once both hips are able to be visualized prepping with DuraPrep after 1015 drapes were applied.  Blue 1015 steriles were applied next followed by sterile skin marker large Betadine Steri-Drape and sheet across feet over the top.  Hydraulic arm was attached making a hole in the drape applying the arm and sealing with a Betadine Steri-Drape.  Timeout procedure was completed 2 g Ancef was given prophylactically.  Oblique incision was made from the ASIS laterally to the trochanter.  Thick subcutaneous adipose layer was divided using the Bovie down to the fascia cleaned off with a Cobb neck extended with scissors and then dull cobra was placed over the top of the capsule medially over the femur.  Laterally a Hibbs retractor was used.  Adipose tissue was divided including the transverse vessels.  Anterior capsule was opened there was a gush of clear fluid and then the deltoid was placed inside the capsule.  Once anterior capsule were was removed with the Bovie oscillating saw was  placed on top of the neck confirmed position with the C arm and neck was cut completing laterally near the trochanter with an osteotome.  Head was removed with some difficulty due to the erosion and no longer hemispherical but finally was removed dislocated there was severe complete loss of articular cartilage and erosion of the head flattening and deformed head.  Sequential reaming up to 51 and 52 cup was inserted with dome screws available but were not needed since the hip acetabulum fit securely would not rotate and then the apex hole illuminator was placed in the central area.  +4 liner was inserted popped and initially would not catch we had removed some of the transverse acetabular ligament so that the cup was finally set in good position impacted in and then was secure.  Hydraulic arm was placed hip was actually rotated to 120 taken down and under and posterior capsule was divided.  Medial and Mueller retractor was placed on the neck and large trochanteric retractor was placed behind the greater trochanter.  Hydraulic arm was used to help pull the femur anteriorly cookie-cutter rondure curettes lateralization run sure again more cookie-cutter used some additional posterior capsule division allowed the femur to come laterally and then preparation progressing up to #3.  Initially up to broach was used to look like we can get a 3n and is to look like it was 2 mm short although the hip was stable in external rotation at 90 and could be extended 45 degrees halfway down to the floor with no subluxation.  #3 trial was  then able to be inserted.  +5 neck restored neck length.  Patient was short preoperatively by x-ray measurement.  She had identical length.  +5 metal ball was inserted after the final 3 stem was placed.  Color was flushed with the calcar.  AP lateral fluoroscopic images were taken skin was directly down the middle.  Good position alignment copious irrigation we coagulation the transverse bleeders V  lock closure.  TXA sponge available.  Subcutaneous tissue reapproximated and skin staple closure.  Patient tolerated procedure well transfer care in stable condition.

## 2020-04-27 NOTE — Transfer of Care (Signed)
Immediate Anesthesia Transfer of Care Note  Patient: Deborah Barnes  Procedure(s) Performed: TOTAL HIP ARTHROPLASTY DIRECT ANTERIOR (Right Hip)  Patient Location: PACU  Anesthesia Type:General  Level of Consciousness: pateint uncooperative  Airway & Oxygen Therapy: Patient Spontanous Breathing and Patient connected to face mask oxygen  Post-op Assessment: Report given to RN and Post -op Vital signs reviewed and stable  Post vital signs: Reviewed and stable  Last Vitals:  Vitals Value Taken Time  BP 120/83 04/27/20 1526  Temp    Pulse 102 04/27/20 1528  Resp 9 04/27/20 1528  SpO2 91 % 04/27/20 1528  Vitals shown include unvalidated device data.  Last Pain:  Vitals:   04/27/20 1204  TempSrc:   PainSc: 8       Patients Stated Pain Goal: 4 (72/55/00 1642)  Complications: No complications documented.

## 2020-04-27 NOTE — Evaluation (Signed)
Physical Therapy Evaluation Patient Details Name: Deborah Barnes MRN: [id number] DOB: 1976/08/18 Today's Date: 04/27/2020   History of Present Illness  Pt is a 44 y/o female s/p R THA, direct anterior. PMH includes COPD, HTN, PTSD, alcohol abuse and drug abuse.   Clinical Impression  Pt is s/p surgery above with deficits below. Requiring min to min guard for mobility tasks using RW this session. Pt with increased drainage at incision site during session; RN notified and addressed during session. Anticipate pt will progress well. Will continue to follow acutely to maximize functional mobility independence and safety.     Follow Up Recommendations Follow surgeon's recommendation for DC plan and follow-up therapies    Equipment Recommendations  Rolling walker with 5" wheels    Recommendations for Other Services       Precautions / Restrictions Precautions Precautions: Fall Restrictions Weight Bearing Restrictions: Yes RLE Weight Bearing: Weight bearing as tolerated      Mobility  Bed Mobility Overal bed mobility: Needs Assistance Bed Mobility: Supine to Sit     Supine to sit: Min assist     General bed mobility comments: Min A for trunk elevation and RLE assist.   Transfers Overall transfer level: Needs assistance Equipment used: Rolling walker (2 wheeled) Transfers: Sit to/from Stand Sit to Stand: Min guard         General transfer comment: Min guard for safety. Cues for safe hand placement.   Ambulation/Gait Ambulation/Gait assistance: Min guard Gait Distance (Feet): 5 Feet Assistive device: Rolling walker (2 wheeled) Gait Pattern/deviations: Step-to pattern;Decreased step length - right;Decreased step length - left;Antalgic Gait velocity: Decreased   General Gait Details: Slow, antalgic gait. Cues for sequencing using RW. Distance limited to chair, as pt with increased drainage from incision. RN in room to address.   Stairs            Wheelchair  Mobility    Modified Rankin (Stroke Patients Only)       Balance Overall balance assessment: Needs assistance Sitting-balance support: No upper extremity supported;Feet supported Sitting balance-Leahy Scale: Good     Standing balance support: Bilateral upper extremity supported;During functional activity Standing balance-Leahy Scale: Poor Standing balance comment: Reliant on BUE support                              Pertinent Vitals/Pain Pain Assessment: 0-10 Pain Score: 6  Pain Location: R hip  Pain Descriptors / Indicators: Burning;Throbbing Pain Intervention(s): Monitored during session;Limited activity within patient's tolerance;Repositioned    Home Living Family/patient expects to be discharged to:: Private residence (Lives in a recovery home) Living Arrangements: Non-relatives/Friends Available Help at Discharge: Friend(s);Available PRN/intermittently Type of Home: House Home Access: Stairs to enter Entrance Stairs-Rails: None Entrance Stairs-Number of Steps: 2 Home Layout: Two level;Able to live on main level with bedroom/bathroom Home Equipment: Gilford Rile - 2 wheels;Shower seat Additional Comments: Reports her walker is broken    Prior Function Level of Independence: Independent with assistive device(s)         Comments: Was using RW, however, reports it was broken     Hand Dominance        Extremity/Trunk Assessment   Upper Extremity Assessment Upper Extremity Assessment: Overall WFL for tasks assessed    Lower Extremity Assessment Lower Extremity Assessment: RLE deficits/detail RLE Deficits / Details: Deficits consistent with post op pain and weakness.     Cervical / Trunk Assessment Cervical / Trunk Assessment: Normal  Communication  Communication: No difficulties  Cognition Arousal/Alertness: Awake/alert Behavior During Therapy: WFL for tasks assessed/performed Overall Cognitive Status: Within Functional Limits for tasks  assessed                                        General Comments      Exercises Total Joint Exercises Ankle Circles/Pumps: AROM;Both;20 reps;Supine   Assessment/Plan    PT Assessment Patient needs continued PT services  PT Problem List Decreased strength;Decreased range of motion;Decreased mobility;Decreased balance;Decreased knowledge of use of DME;Pain       PT Treatment Interventions DME instruction;Gait training;Stair training;Functional mobility training;Therapeutic activities;Therapeutic exercise;Balance training;Patient/family education    PT Goals (Current goals can be found in the Care Plan section)  Acute Rehab PT Goals Patient Stated Goal: to be independent PT Goal Formulation: With patient Time For Goal Achievement: 05/11/20 Potential to Achieve Goals: Good    Frequency 7X/week   Barriers to discharge        Co-evaluation               AM-PAC PT "6 Clicks" Mobility  Outcome Measure Help needed turning from your back to your side while in a flat bed without using bedrails?: A Little Help needed moving from lying on your back to sitting on the side of a flat bed without using bedrails?: A Little Help needed moving to and from a bed to a chair (including a wheelchair)?: A Little Help needed standing up from a chair using your arms (e.g., wheelchair or bedside chair)?: A Little Help needed to walk in hospital room?: A Little Help needed climbing 3-5 steps with a railing? : A Lot 6 Click Score: 17    End of Session Equipment Utilized During Treatment: Gait belt Activity Tolerance: Patient tolerated treatment well Patient left: in chair;with call bell/phone within reach;with nursing/sitter in room Nurse Communication: Mobility status;Other (comment) (increased drainage from incision ) PT Visit Diagnosis: Other abnormalities of gait and mobility (R26.89);Pain Pain - Right/Left: Right Pain - part of body: Hip    Time: 1710-1732 PT Time  Calculation (min) (ACUTE ONLY): 22 min   Charges:   PT Evaluation $PT Eval Low Complexity: 1 Low          Lou Miner, DPT  Acute Rehabilitation Services  Pager: [phone] Office: [phone]   Deborah Barnes 04/27/2020, 5:47 PM

## 2020-04-28 ENCOUNTER — Encounter (HOSPITAL_COMMUNITY): Payer: Self-pay | Admitting: Orthopaedic Surgery

## 2020-04-28 DIAGNOSIS — Z96641 Presence of right artificial hip joint: Secondary | ICD-10-CM

## 2020-04-28 LAB — CBC
HCT: 29.5 % — ABNORMAL LOW (ref 36.0–46.0)
Hemoglobin: 9.3 g/dL — ABNORMAL LOW (ref 12.0–15.0)
MCH: 28.9 pg (ref 26.0–34.0)
MCHC: 31.5 g/dL (ref 30.0–36.0)
MCV: 91.6 fL (ref 80.0–100.0)
Platelets: 320 10*3/uL (ref 150–400)
RBC: 3.22 MIL/uL — ABNORMAL LOW (ref 3.87–5.11)
RDW: 13.5 % (ref 11.5–15.5)
WBC: 14.1 10*3/uL — ABNORMAL HIGH (ref 4.0–10.5)
nRBC: 0 % (ref 0.0–0.2)

## 2020-04-28 LAB — BASIC METABOLIC PANEL
Anion gap: 12 (ref 5–15)
BUN: 9 mg/dL (ref 6–20)
CO2: 25 mmol/L (ref 22–32)
Calcium: 9.4 mg/dL (ref 8.9–10.3)
Chloride: 102 mmol/L (ref 98–111)
Creatinine, Ser: 0.56 mg/dL (ref 0.44–1.00)
GFR calc Af Amer: >60 mL/min (ref >60)
GFR calc non Af Amer: >60 mL/min (ref >60)
Glucose, Bld: 143 mg/dL — ABNORMAL HIGH (ref 70–99)
Potassium: 4 mmol/L (ref 3.5–5.1)
Sodium: 139 mmol/L (ref 135–145)

## 2020-04-28 NOTE — Progress Notes (Signed)
Physical Therapy Treatment Patient Details Name: Deborah Barnes MRN: [id number] DOB: Aug 19, 1976 Today's Date: 04/28/2020    History of Present Illness Pt is a 44 y/o female s/p R THA, direct anterior. PMH includes COPD, HTN, PTSD, alcohol abuse and drug abuse.     PT Comments    Pt progressing towards physical therapy goals. Pt reporting increased pain in R knee this session, and also complained of pain in elbows and shoulders from RW use. Pt has been using the RW PTA and appears to be baseline pain. RW was lowered during session which pt reports felt better to her. Pt also reporting she needs a new walker as the one she has is "falling apart". Pt educated on improved gait pattern, and initiated HEP. Pt will benefit from HHPT if MD agreeable to order. Will plan for a PM session as pt can tolerate.    Follow Up Recommendations  Follow surgeon's recommendation for DC plan and follow-up therapies     Equipment Recommendations  Rolling walker with 5" wheels    Recommendations for Other Services       Precautions / Restrictions Precautions Precautions: Fall Restrictions Weight Bearing Restrictions: No RLE Weight Bearing: Weight bearing as tolerated    Mobility  Bed Mobility Overal bed mobility: Needs Assistance Bed Mobility: Supine to Sit     Supine to sit: Supervision     General bed mobility comments: No assist required. Pt was able to transition to EOB with min use of railings.  Transfers Overall transfer level: Needs assistance Equipment used: Rolling walker (2 wheeled) Transfers: Sit to/from Stand Sit to Stand: Supervision         General transfer comment: Close supervision for safety. Pt impulsively stood and started to ambulate toawrds the bathroom despite therapist asking her to wait. Pt did well with no overt LOB.   Ambulation/Gait Ambulation/Gait assistance: Min guard Gait Distance (Feet): 150 Feet Assistive device: Rolling walker (2 wheeled) Gait  Pattern/deviations: Decreased step length - right;Decreased step length - left;Antalgic;Step-through pattern Gait velocity: Decreased Gait velocity interpretation: <1.8 ft/sec, indicate of risk for recurrent falls General Gait Details: Progressed to step-through gait pattern, however required several short standing rest breaks due to R knee pain. VC's for improved heel strike, equal step/stride length, and fluid walker movement.    Stairs             Wheelchair Mobility    Modified Rankin (Stroke Patients Only)       Balance Overall balance assessment: Needs assistance Sitting-balance support: No upper extremity supported;Feet supported Sitting balance-Leahy Scale: Good     Standing balance support: Bilateral upper extremity supported;During functional activity Standing balance-Leahy Scale: Poor Standing balance comment: Reliant on BUE support                             Cognition Arousal/Alertness: Awake/alert Behavior During Therapy: WFL for tasks assessed/performed Overall Cognitive Status: Impaired/Different from baseline Area of Impairment: Safety/judgement                         Safety/Judgement: Decreased awareness of safety            Exercises Total Joint Exercises Ankle Circles/Pumps: AROM;Both;20 reps;Supine Quad Sets: 10 reps Short Arc Quad: 10 reps Hip ABduction/ADduction: 5 reps Long Arc Quad: 10 reps    General Comments        Pertinent Vitals/Pain Pain Assessment: 0-10 Pain Score: 8  Pain Location: R hip  Pain Descriptors / Indicators: Burning;Throbbing;Operative site guarding Pain Intervention(s): Limited activity within patient's tolerance;Monitored during session;Repositioned    Home Living                      Prior Function            PT Goals (current goals can now be found in the care plan section) Acute Rehab PT Goals Patient Stated Goal: to be independent so she can return to her halfway  home PT Goal Formulation: With patient Time For Goal Achievement: 05/11/20 Potential to Achieve Goals: Good Progress towards PT goals: Progressing toward goals    Frequency    7X/week      PT Plan Current plan remains appropriate    Co-evaluation              AM-PAC PT "6 Clicks" Mobility   Outcome Measure  Help needed turning from your back to your side while in a flat bed without using bedrails?: A Little Help needed moving from lying on your back to sitting on the side of a flat bed without using bedrails?: A Little Help needed moving to and from a bed to a chair (including a wheelchair)?: A Little Help needed standing up from a chair using your arms (e.g., wheelchair or bedside chair)?: A Little Help needed to walk in hospital room?: A Little Help needed climbing 3-5 steps with a railing? : A Lot 6 Click Score: 17    End of Session Equipment Utilized During Treatment: Gait belt Activity Tolerance: Patient tolerated treatment well Patient left: in chair;with call bell/phone within reach;with nursing/sitter in room Nurse Communication: Mobility status PT Visit Diagnosis: Other abnormalities of gait and mobility (R26.89);Pain Pain - Right/Left: Right Pain - part of body: Hip     Time: 0852-0928 PT Time Calculation (min) (ACUTE ONLY): 36 min  Charges:  $Gait Training: 8-22 mins $Therapeutic Exercise: 8-22 mins                     Rolinda Roan, PT, DPT Acute Rehabilitation Services Pager: [phone] Office: [phone]    Thelma Comp 04/28/2020, 10:55 AM

## 2020-04-28 NOTE — Progress Notes (Signed)
Physical Therapy Treatment Patient Details Name: Deborah Barnes MRN: [id number] DOB: [birth year] Today's Date: 04/28/2020    History of Present Illness Pt is a 44 y/o female s/p R THA, direct anterior. PMH includes COPD, HTN, PTSD, alcohol abuse and drug abuse.     PT Comments    Pt progressing towards physical therapy goals. Limited this session by pain and face flushed and hot during OOB mobility. Pt denies nausea, lightheadedness or dizziness. VSS and in Vitals Flowsheet. Reviewed HEP but pt very painful and unable to progress to standing exercise this session. Will continue to follow and progress as able per POC.     Follow Up Recommendations  Follow surgeon's recommendation for DC plan and follow-up therapies     Equipment Recommendations  Rolling walker with 5" wheels    Recommendations for Other Services       Precautions / Restrictions Precautions Precautions: Fall Restrictions Weight Bearing Restrictions: No RLE Weight Bearing: Weight bearing as tolerated    Mobility  Bed Mobility               General bed mobility comments: Pt sitting up in the recliner upon PT arrival.   Transfers Overall transfer level: Needs assistance Equipment used: Rolling walker (2 wheeled) Transfers: Sit to/from Stand Sit to Stand: Supervision         General transfer comment: Close supervision for safety. Pt demonstrated proper hand placement on seated surface for safety.   Ambulation/Gait Ambulation/Gait assistance: Min guard Gait Distance (Feet): 100 Feet Assistive device: Rolling walker (2 wheeled) Gait Pattern/deviations: Decreased step length - right;Decreased step length - left;Antalgic;Step-through pattern Gait velocity: Decreased Gait velocity interpretation: <1.8 ft/sec, indicate of risk for recurrent falls General Gait Details: Pt became flushed and hot during gait training. UE's limiting her the most but reports her R knee pain is improved this session.     Stairs             Wheelchair Mobility    Modified Rankin (Stroke Patients Only)       Balance Overall balance assessment: Needs assistance Sitting-balance support: No upper extremity supported;Feet supported Sitting balance-Leahy Scale: Good     Standing balance support: Bilateral upper extremity supported;During functional activity Standing balance-Leahy Scale: Poor Standing balance comment: Reliant on BUE support                             Cognition Arousal/Alertness: Awake/alert Behavior During Therapy: WFL for tasks assessed/performed Overall Cognitive Status: Within Functional Limits for tasks assessed                                        Exercises Total Joint Exercises Hip ABduction/ADduction:  (5 reps abduction, 10 reps isometric adduction) Long Arc Quad: 10 reps    General Comments        Pertinent Vitals/Pain Pain Assessment: Faces Faces Pain Scale: Hurts whole lot Pain Location: R hip  Pain Descriptors / Indicators: Burning;Throbbing;Operative site guarding Pain Intervention(s): Limited activity within patient's tolerance;Monitored during session;Repositioned    Home Living                      Prior Function            PT Goals (current goals can now be found in the care plan section) Acute Rehab PT Goals Patient Stated Goal:  to be independent so she can return to her halfway home PT Goal Formulation: With patient Time For Goal Achievement: 05/11/20 Potential to Achieve Goals: Good Progress towards PT goals: Progressing toward goals    Frequency    7X/week      PT Plan Current plan remains appropriate    Co-evaluation              AM-PAC PT "6 Clicks" Mobility   Outcome Measure  Help needed turning from your back to your side while in a flat bed without using bedrails?: A Little Help needed moving from lying on your back to sitting on the side of a flat bed without using  bedrails?: A Little Help needed moving to and from a bed to a chair (including a wheelchair)?: A Little Help needed standing up from a chair using your arms (e.g., wheelchair or bedside chair)?: A Little Help needed to walk in hospital room?: A Little Help needed climbing 3-5 steps with a railing? : A Lot 6 Click Score: 17    End of Session Equipment Utilized During Treatment: Gait belt Activity Tolerance: Patient tolerated treatment well Patient left: in chair;with call bell/phone within reach;with nursing/sitter in room Nurse Communication: Mobility status PT Visit Diagnosis: Other abnormalities of gait and mobility (R26.89);Pain Pain - Right/Left: Right Pain - part of body: Hip     Time: 1345-1425 PT Time Calculation (min) (ACUTE ONLY): 40 min  Charges:  $Gait Training: 8-22 mins $Therapeutic Exercise: 8-22 mins $Therapeutic Activity: 8-22 mins                     Rolinda Roan, PT, DPT Acute Rehabilitation Services Pager: [phone] Office: [phone]    Thelma Comp 04/28/2020, 3:32 PM

## 2020-04-28 NOTE — Progress Notes (Signed)
° °  Subjective: 1 Day Post-Op Procedure(s) (LRB): TOTAL HIP ARTHROPLASTY DIRECT ANTERIOR (Right) Patient reports pain as mild.    Objective: Vital signs in last 24 hours: Temp:  [97.9 F (36.6 C)-99.5 F (37.5 C)] 97.9 F (36.6 C) (08/10 0738) Pulse Rate:  [76-110] 89 (08/10 0738) Resp:  [12-21] 18 (08/10 0738) BP: (120-170)/(79-99) 147/83 (08/10 0738) SpO2:  [88 %-99 %] 99 % (08/10 0738) Weight:  [122.5 kg] 122.5 kg (08/09 1025)  Intake/Output from previous day: 08/09 0701 - 08/10 0700 In: 2090 [P.O.:240; I.V.:1500; IV Piggyback:350] Out: 500 [Urine:100; Blood:400] Intake/Output this shift: No intake/output data recorded.  Recent Labs    04/27/20 1156 04/28/20 0624  HGB 11.6* 9.3*   Recent Labs    04/27/20 1156 04/28/20 0624  WBC 8.1 14.1*  RBC 4.04 3.22*  HCT 36.6 29.5*  PLT 319 320   Recent Labs    04/27/20 1156 04/28/20 0624  NA 140 139  K 4.6 4.0  CL 104 102  CO2 23 25  BUN 8 9  CREATININE 0.60 0.56  GLUCOSE 98 143*  CALCIUM 9.2 9.4   No results for input(s): LABPT, INR in the last 72 hours.  Neurologically intact DG C-Arm 1-60 Min  Result Date: 04/27/2020 CLINICAL DATA:  Right hip replacement. EXAM: DG C-ARM 1-60 MIN; OPERATIVE RIGHT HIP WITH PELVIS FLUOROSCOPY TIME:  Fluoroscopy Time:  33 seconds Radiation Exposure Index (if provided by the fluoroscopic device): 6.68 mGy Number of Acquired Spot Images: 4 COMPARISON:  Hip radiograph 04/17/2020 FINDINGS: Four fluoroscopic spot images obtained in the operating room. Interval right hip arthroplasty. Fluoroscopy time 33 seconds. Total dose 6.68 mGy. IMPRESSION: Intraoperative fluoroscopy during right hip arthroplasty. Electronically Signed   By: Keith Rake M.D.   On: 04/27/2020 15:26   DG Hip Port Unilat With Pelvis 1V Right  Result Date: 04/27/2020 CLINICAL DATA:  Postop hip replacement. EXAM: DG HIP (WITH OR WITHOUT PELVIS) 1V PORT RIGHT COMPARISON:  April 17, 2020 FINDINGS: Patient has undergone  total hip arthroplasty on the right. The alignment appears near anatomic. There are expected postsurgical changes. There is no acute fracture. IMPRESSION: Status post right total hip arthroplasty without evidence of hardware complication. Electronically Signed   By: Constance Holster M.D.   On: 04/27/2020 16:14   DG HIP OPERATIVE UNILAT WITH PELVIS RIGHT  Result Date: 04/27/2020 CLINICAL DATA:  Right hip replacement. EXAM: DG C-ARM 1-60 MIN; OPERATIVE RIGHT HIP WITH PELVIS FLUOROSCOPY TIME:  Fluoroscopy Time:  33 seconds Radiation Exposure Index (if provided by the fluoroscopic device): 6.68 mGy Number of Acquired Spot Images: 4 COMPARISON:  Hip radiograph 04/17/2020 FINDINGS: Four fluoroscopic spot images obtained in the operating room. Interval right hip arthroplasty. Fluoroscopy time 33 seconds. Total dose 6.68 mGy. IMPRESSION: Intraoperative fluoroscopy during right hip arthroplasty. Electronically Signed   By: Keith Rake M.D.   On: 04/27/2020 15:26    Assessment/Plan: 1 Day Post-Op Procedure(s) (LRB): TOTAL HIP ARTHROPLASTY DIRECT ANTERIOR (Right) Up with therapy, likely home Friday. Staying at rehab house , was working, other residents out working during the day. Needs to be independent to BR and in hallway. Will not need HHPT.   Deborah Barnes 04/28/2020, 7:52 AM

## 2020-04-28 NOTE — Progress Notes (Signed)
CSW received request for patient to have a walker. CSW sent referral to [employer] to review for [facility].  Mikeala Girdler LCSW

## 2020-04-29 LAB — CBC
HCT: 27.8 % — ABNORMAL LOW (ref 36.0–46.0)
Hemoglobin: 8.8 g/dL — ABNORMAL LOW (ref 12.0–15.0)
MCH: 29.7 pg (ref 26.0–34.0)
MCHC: 31.7 g/dL (ref 30.0–36.0)
MCV: 93.9 fL (ref 80.0–100.0)
Platelets: 268 10*3/uL (ref 150–400)
RBC: 2.96 MIL/uL — ABNORMAL LOW (ref 3.87–5.11)
RDW: 13.9 % (ref 11.5–15.5)
WBC: 10.2 10*3/uL (ref 4.0–10.5)
nRBC: 0 % (ref 0.0–0.2)

## 2020-04-29 MED ORDER — OXYCODONE-ACETAMINOPHEN 5-325 MG PO TABS: 1.0000 | ORAL_TABLET | Freq: Four times a day (QID) | ORAL | 0 refills | Status: DC | PRN

## 2020-04-29 MED ORDER — ASPIRIN 325 MG PO TABS
325.0000 mg | ORAL_TABLET | Freq: Every day | ORAL | Status: DC
Start: 2020-04-29 — End: 2020-05-28

## 2020-04-29 NOTE — Evaluation (Signed)
Occupational Therapy Evaluation Patient Details Name: Deborah Barnes MRN: [id number] DOB: September 12, 1976 Today's Date: 04/29/2020    History of Present Illness Pt is a 44 y/o female s/p R THA, direct anterior. PMH includes COPD, HTN, PTSD, alcohol abuse and drug abuse.    Clinical Impression   Pt was ambulating with a RW and sitting to shower, but did not require assistance for ADL or IADL prior to admission. Pt likely to be alone much of the day and needs to be modified independent to discharge back to her recovery home. Issued pt AE for LB ADL and instructed in compensatory strategies for LB bathing and dressing. Pt demonstrated ability to toilet and groom at sink modified independently. She may need assistive device for getting her R LE back into the bed. Will follow.     Follow Up Recommendations  No OT follow up    Equipment Recommendations  3 in 1 bedside commode (bariatric)    Recommendations for Other Services       Precautions / Restrictions Precautions Precautions: Fall Restrictions Weight Bearing Restrictions: No RLE Weight Bearing: Weight bearing as tolerated      Mobility Bed Mobility Overal bed mobility: Needs Assistance Bed Mobility: Supine to Sit;Sit to Supine     Supine to sit: Modified independent (Device/Increase time) Sit to supine: Min assist   General bed mobility comments: assist for R LE back into bed, may need a leg lifter  Transfers Overall transfer level: Modified independent Equipment used: Rolling walker (2 wheeled)             General transfer comment: from bed and toilet    Balance Overall balance assessment: Needs assistance   Sitting balance-Leahy Scale: Good       Standing balance-Leahy Scale: Poor Standing balance comment: can release walker in static standing, needs RW for dynamic balance                           ADL either performed or assessed with clinical judgement   ADL Overall ADL's : Needs  assistance/impaired Eating/Feeding: Independent   Grooming: Wash/dry hands;Standing;Modified independent   Upper Body Bathing: Set up;Sitting   Lower Body Bathing: Minimal assistance;Sit to/from stand Lower Body Bathing Details (indicate cue type and reason): educated in use of long handled bath sponge and reacher Upper Body Dressing : Set up;Sitting   Lower Body Dressing: Minimal assistance;Sit to/from stand Lower Body Dressing Details (indicate cue type and reason): instructed to dress operated leg first and in use of AE Toilet Transfer: Modified Independent;Ambulation;RW;Comfort height toilet;Grab bars   Toileting- Clothing Manipulation and Hygiene: Modified independent;Sit to/from Nurse, children's Details (indicate cue type and reason): verbally instructed in tub transfer to shower seat avoiding stepping over edge of tub Functional mobility during ADLs: Modified independent;Rolling walker (within room, to bathroom) General ADL Comments: pt has a walker bag at home, issued LB AE     Vision Baseline Vision/History: No visual deficits       Perception     Praxis      Pertinent Vitals/Pain Pain Assessment: Faces Faces Pain Scale: Hurts little more Pain Location: R hip  Pain Descriptors / Indicators: Guarding;Sore;Discomfort Pain Intervention(s): Monitored during session;Premedicated before session;Repositioned;Ice applied     Hand Dominance Right   Extremity/Trunk Assessment Upper Extremity Assessment Upper Extremity Assessment: Overall WFL for tasks assessed   Lower Extremity Assessment Lower Extremity Assessment: Defer to PT evaluation   Cervical /  Trunk Assessment Cervical / Trunk Assessment: Other exceptions (obesity)   Communication Communication Communication: No difficulties   Cognition Arousal/Alertness: Awake/alert Behavior During Therapy: Anxious Overall Cognitive Status: Within Functional Limits for tasks assessed                                      General Comments       Exercises     Shoulder Instructions      Home Living Family/patient expects to be discharged to:: Private residence (lives in a drug recovery home) Living Arrangements: Non-relatives/Friends Available Help at Discharge: Friend(s);Available PRN/intermittently Type of Home: House Home Access: Stairs to enter [employer] of Steps: 2 Entrance Stairs-Rails: None Home Layout: Two level;Able to live on main level with bedroom/bathroom     Bathroom Shower/Tub: Teacher, early years/pre: Standard     Home Equipment: Environmental consultant - 2 wheels;Shower seat   Additional Comments: Reports her walker is broken, plans to stay on first floor      Prior Functioning/Environment Level of Independence: Independent with assistive device(s)        Comments: Was using RW, however, reports it was broken        OT Problem List: Decreased strength;Impaired balance (sitting and/or standing);Decreased knowledge of use of DME or AE;Pain;Obesity      OT Treatment/Interventions: Self-care/ADL training;DME and/or AE instruction;Patient/family education;Balance training;Therapeutic activities    OT Goals(Current goals can be found in the care plan section) Acute Rehab OT Goals Patient Stated Goal: to be independent so she can return to her halfway home OT Goal Formulation: With patient Time For Goal Achievement: 05/13/20 Potential to Achieve Goals: Good  OT Frequency: Min 2X/week   Barriers to D/C:            Co-evaluation              AM-PAC OT "6 Clicks" Daily Activity     Outcome Measure Help from another person eating meals?: None Help from another person taking care of personal grooming?: None Help from another person toileting, which includes using toliet, bedpan, or urinal?: None Help from another person bathing (including washing, rinsing, drying)?: A Little Help from another person to put on and taking off  regular upper body clothing?: None Help from another person to put on and taking off regular lower body clothing?: A Little 6 Click Score: 22   End of Session Equipment Utilized During Treatment: Rolling walker Nurse Communication: Other (comment) (needs bariatric 3 in 1)  Activity Tolerance: Patient tolerated treatment well Patient left: in bed;with call bell/phone within reach  OT Visit Diagnosis: Pain;Other abnormalities of gait and mobility (R26.89)                Time: 4163-8453 OT Time Calculation (min): 27 min Charges:  OT General Charges $OT Visit: 1 Visit OT Evaluation $OT Eval Moderate Complexity: 1 Mod OT Treatments $Self Care/Home Management : 8-22 mins  Nestor Lewandowsky, OTR/L Acute Rehabilitation Services Pager: [phone] Office: [phone]  Malka So 04/29/2020, 11:30 AM

## 2020-04-29 NOTE — Progress Notes (Signed)
   Subjective: 2 Days Post-Op Procedure(s) (LRB): TOTAL HIP ARTHROPLASTY DIRECT ANTERIOR (Right) Patient reports pain as mild and moderate.    Objective: Vital signs in last 24 hours: Temp:  [98.2 F (36.8 C)-99.2 F (37.3 C)] 98.9 F (37.2 C) (08/11 0336) Pulse Rate:  [72-82] 73 (08/11 0336) Resp:  [18-20] 18 (08/11 0336) BP: (139-162)/(74-96) 139/74 (08/11 0336) SpO2:  [98 %-100 %] 98 % (08/11 0336)  Intake/Output from previous day: No intake/output data recorded. Intake/Output this shift: No intake/output data recorded.  Recent Labs    04/27/20 1156 04/28/20 0624 04/29/20 0530  HGB 11.6* 9.3* 8.8*   Recent Labs    04/28/20 0624 04/29/20 0530  WBC 14.1* 10.2  RBC 3.22* 2.96*  HCT 29.5* 27.8*  PLT 320 268   Recent Labs    04/27/20 1156 04/28/20 0624  NA 140 139  K 4.6 4.0  CL 104 102  CO2 23 25  BUN 8 9  CREATININE 0.60 0.56  GLUCOSE 98 143*  CALCIUM 9.2 9.4   No results for input(s): LABPT, INR in the last 72 hours.  Neurologically intact No results found.  Assessment/Plan: 2 Days Post-Op Procedure(s) (LRB): TOTAL HIP ARTHROPLASTY DIRECT ANTERIOR (Right) Up with therapy, home likely in AM . Plan # 30 tablets pain meds , one bayer aspirin per day times 4 wks, no muscle relaxant. Office one week   Deborah Barnes 04/29/2020, 7:48 AM

## 2020-04-29 NOTE — Discharge Instructions (Signed)

## 2020-04-29 NOTE — Progress Notes (Signed)
Physical Therapy Treatment Patient Details Name: Deborah Barnes MRN: [id number] DOB: [birth year] Today's Date: 04/29/2020    History of Present Illness Pt is a 44 y/o female s/p R THA, direct anterior. PMH includes COPD, HTN, PTSD, alcohol abuse and drug abuse.     PT Comments    Pt most limited by R knee pain during ambulation requiring RW and demo's increased dependence on UEs. Pt unable to lift LE into bed will work with gait belt or leg lifter next session. Pt with minimal R hip flexion initiation, focused on AA rom to R hip. Acute PT to cont to follow.    Follow Up Recommendations  Follow surgeon's recommendation for DC plan and follow-up therapies     Equipment Recommendations  Rolling walker with 5" wheels    Recommendations for Other Services       Precautions / Restrictions Precautions Precautions: Fall Restrictions Weight Bearing Restrictions: No RLE Weight Bearing: Weight bearing as tolerated    Mobility  Bed Mobility Overal bed mobility: Needs Assistance Bed Mobility: Supine to Sit;Sit to Supine     Supine to sit: Modified independent (Device/Increase time) Sit to supine: Min assist   General bed mobility comments: minA for R LE management  Transfers Overall transfer level: Modified independent Equipment used: Rolling walker (2 wheeled) Transfers: Sit to/from Stand Sit to Stand: Supervision         General transfer comment: no physical assist needed, increased time  Ambulation/Gait Ambulation/Gait assistance: Min guard Gait Distance (Feet): 100 Feet Assistive device: Rolling walker (2 wheeled) Gait Pattern/deviations: Decreased step length - right;Decreased step length - left;Antalgic;Step-through pattern Gait velocity: dec Gait velocity interpretation: <1.31 ft/sec, indicative of household ambulator General Gait Details: pt with increased R knee pain requiring increased dependence on bilat UEs on walker   Stairs Stairs: Yes Stairs  assistance: Min assist Stair Management: One rail Right;Sideways;Step to pattern Number of Stairs: 3 General stair comments: pt with tears due to fear of heights, verbal cues to focus on therapist and deep breathing in addition to sequencing   Wheelchair Mobility    Modified Rankin (Stroke Patients Only)       Balance Overall balance assessment: Needs assistance Sitting-balance support: No upper extremity supported;Feet supported Sitting balance-Leahy Scale: Good     Standing balance support: Bilateral upper extremity supported;No upper extremity supported Standing balance-Leahy Scale: Poor Standing balance comment: dependent on RW                            Cognition Arousal/Alertness: Awake/alert Behavior During Therapy: Anxious Overall Cognitive Status: Within Functional Limits for tasks assessed                                 General Comments: pt with fear of heights and began to cry on the stairs      Exercises Total Joint Exercises Knee Flexion: AAROM;Right    General Comments General comments (skin integrity, edema, etc.): VSS      Pertinent Vitals/Pain Pain Assessment: Faces Faces Pain Scale: Hurts even more Pain Location: R knee Pain Descriptors / Indicators: Guarding;Sore;Discomfort Pain Intervention(s): Monitored during session    Home Living Family/patient expects to be discharged to:: Private residence (lives in a drug recovery home) Living Arrangements: Non-relatives/Friends Available Help at Discharge: Friend(s);Available PRN/intermittently Type of Home: House Home Access: Stairs to enter Entrance Stairs-Rails: None Home Layout: Two level;Able to live  on main level with bedroom/bathroom Home Equipment: Walker - 2 wheels;Shower seat Additional Comments: Reports her walker is broken, plans to stay on first floor    Prior Function Level of Independence: Independent with assistive device(s)      Comments: Was using RW,  however, reports it was broken   PT Goals (current goals can now be found in the care plan section) Acute Rehab PT Goals Patient Stated Goal: to be independent so she can return to her halfway home Progress towards PT goals: Progressing toward goals    Frequency    7X/week      PT Plan Current plan remains appropriate    Co-evaluation              AM-PAC PT "6 Clicks" Mobility   Outcome Measure  Help needed turning from your back to your side while in a flat bed without using bedrails?: A Little Help needed moving from lying on your back to sitting on the side of a flat bed without using bedrails?: A Little Help needed moving to and from a bed to a chair (including a wheelchair)?: A Little Help needed standing up from a chair using your arms (e.g., wheelchair or bedside chair)?: A Little Help needed to walk in hospital room?: A Little Help needed climbing 3-5 steps with a railing? : A Little 6 Click Score: 18    End of Session Equipment Utilized During Treatment: Gait belt Activity Tolerance: Patient tolerated treatment well Patient left: in chair;with call bell/phone within reach;with nursing/sitter in room Nurse Communication: Mobility status PT Visit Diagnosis: Other abnormalities of gait and mobility (R26.89);Pain Pain - Right/Left: Right Pain - part of body: Hip     Time: 1224-4975 PT Time Calculation (min) (ACUTE ONLY): 26 min  Charges:  $Gait Training: 8-22 mins $Therapeutic Exercise: 8-22 mins                     Kittie Plater, PT, DPT Acute Rehabilitation Services Pager #: [phone] Office #: [phone]    Berline Lopes 04/29/2020, 12:56 PM

## 2020-04-29 NOTE — Progress Notes (Signed)
Physical Therapy Treatment Patient Details Name: Deborah Barnes MRN: [id number] DOB: 24-Apr-1976 Today's Date: 04/29/2020    History of Present Illness Pt is a 44 y/o female s/p R THA, direct anterior. PMH includes COPD, HTN, PTSD, alcohol abuse and drug abuse.     PT Comments    Seen again to advance to mod I for safe d/c back to halfway house. Pt with no assist at home. Pt functioning at supervision level. Pt remains to have limited R LE WBing requiring use of RW due to R knee pain more than R hip pain. Recommend OT session to assist in mod I function with ADLs as pt with minimal R hip flexion initiation. Pt educated on how to use a gait belt to assist R LE back up into bed. Acute PT to cont to follow to progress to safe mod I function.    Follow Up Recommendations  Follow surgeon's recommendation for DC plan and follow-up therapies     Equipment Recommendations  Rolling walker with 5" wheels    Recommendations for Other Services       Precautions / Restrictions Precautions Precautions: Fall Restrictions Weight Bearing Restrictions: No RLE Weight Bearing: Weight bearing as tolerated    Mobility  Bed Mobility Overal bed mobility: Needs Assistance Bed Mobility: Supine to Sit;Sit to Supine     Supine to sit: Modified independent (Device/Increase time) Sit to supine: Min guard   General bed mobility comments: pt instructed on how to use gait belt or rolled up towel to assist with raising R LE up into bed until R LE is stronger  Transfers Overall transfer level: Modified independent Equipment used: Rolling walker (2 wheeled) Transfers: Sit to/from Stand Sit to Stand: Supervision         General transfer comment: no physical assist needed, increased time  Ambulation/Gait Ambulation/Gait assistance: Min guard Gait Distance (Feet): 100 Feet Assistive device: Rolling walker (2 wheeled) Gait Pattern/deviations: Decreased step length - right;Decreased step length -  left;Antalgic;Step-through pattern Gait velocity: dec Gait velocity interpretation: 1.31 - 2.62 ft/sec, indicative of limited community ambulator General Gait Details: pt with increased R knee pain requiring increased dependence on bilat UEs on walker   Stairs Stairs: Yes Stairs assistance: Min assist Stair Management: One rail Right;Sideways;Step to pattern Number of Stairs: 5 General stair comments: pt with improved tolerance of the stairs from fear stand point but continues to have R knee pain   Wheelchair Mobility    Modified Rankin (Stroke Patients Only)       Balance Overall balance assessment: Needs assistance Sitting-balance support: No upper extremity supported;Feet supported Sitting balance-Leahy Scale: Good     Standing balance support: Bilateral upper extremity supported;No upper extremity supported Standing balance-Leahy Scale: Poor Standing balance comment: dependent on RW                            Cognition Arousal/Alertness: Awake/alert Behavior During Therapy: Anxious Overall Cognitive Status: Within Functional Limits for tasks assessed                                 General Comments: pt with fear of heights and began to cry on the stairs      Exercises Total Joint Exercises Quad Sets: 10 reps Gluteal Sets: AROM;10 reps;Supine Knee Flexion: AAROM;Right;10 reps;Supine    General Comments General comments (skin integrity, edema, etc.): VSS  Pertinent Vitals/Pain Pain Assessment: Faces Faces Pain Scale: Hurts even more Pain Location: R knee and hip Pain Descriptors / Indicators: Guarding;Sore;Discomfort Pain Intervention(s): Monitored during session    Home Living Family/patient expects to be discharged to:: Private residence (lives in a drug recovery home) Living Arrangements: Non-relatives/Friends Available Help at Discharge: Friend(s);Available PRN/intermittently Type of Home: House Home Access: Stairs to  enter Entrance Stairs-Rails: None Home Layout: Two level;Able to live on main level with bedroom/bathroom Home Equipment: Gilford Rile - 2 wheels;Shower seat Additional Comments: Reports her walker is broken, plans to stay on first floor    Prior Function Level of Independence: Independent with assistive device(s)      Comments: Was using RW, however, reports it was broken   PT Goals (current goals can now be found in the care plan section) Acute Rehab PT Goals Patient Stated Goal: to be independent so she can return to her halfway home Progress towards PT goals: Progressing toward goals    Frequency    7X/week      PT Plan Current plan remains appropriate    Co-evaluation              AM-PAC PT "6 Clicks" Mobility   Outcome Measure  Help needed turning from your back to your side while in a flat bed without using bedrails?: A Little Help needed moving from lying on your back to sitting on the side of a flat bed without using bedrails?: A Little Help needed moving to and from a bed to a chair (including a wheelchair)?: A Little Help needed standing up from a chair using your arms (e.g., wheelchair or bedside chair)?: None Help needed to walk in hospital room?: A Little Help needed climbing 3-5 steps with a railing? : A Little 6 Click Score: 19    End of Session Equipment Utilized During Treatment: Gait belt Activity Tolerance: Patient tolerated treatment well Patient left: in chair;with call bell/phone within reach;with nursing/sitter in room Nurse Communication: Mobility status PT Visit Diagnosis: Other abnormalities of gait and mobility (R26.89);Pain Pain - Right/Left: Right Pain - part of body: Hip     Time: 9629-5284 PT Time Calculation (min) (ACUTE ONLY): 24 min  Charges:  $Gait Training: 8-22 mins $Therapeutic Exercise: 8-22 mins                     Deborah Barnes, PT, DPT Acute Rehabilitation Services Pager #: [phone] Office #: [phone]    Deborah Barnes 04/29/2020, 1:01 PM

## 2020-04-29 NOTE — Progress Notes (Signed)
CSW received request for 3in1 bedside commode recommended by OT. Patient requires a bariatric 3in1 BSC due to body habitus. Requested charity from [employer].   Eri Platten LCSW

## 2020-04-30 MED ORDER — WHITE PETROLATUM EX OINT
TOPICAL_OINTMENT | CUTANEOUS | Status: AC
  Filled 2020-04-30: qty 28.35

## 2020-04-30 NOTE — Progress Notes (Signed)
Subjective: Doing well. Pain controlled.  Ready to discharge today.     Objective: Vital signs in last 24 hours: Temp:  [97.8 F (36.6 C)-99.7 F (37.6 C)] 97.8 F (36.6 C) (08/12 0805) Pulse Rate:  [68-94] 87 (08/12 0805) Resp:  [17-20] 17 (08/12 0805) BP: (125-150)/(69-87) 128/74 (08/12 0805) SpO2:  [97 %-100 %] 99 % (08/12 0805)  Intake/Output from previous day: No intake/output data recorded. Intake/Output this shift: Total I/O In: 240 [P.O.:240] Out: -   Recent Labs    04/27/20 1156 04/28/20 0624 04/29/20 0530  HGB 11.6* 9.3* 8.8*   Recent Labs    04/28/20 0624 04/29/20 0530  WBC 14.1* 10.2  RBC 3.22* 2.96*  HCT 29.5* 27.8*  PLT 320 268   Recent Labs    04/27/20 1156 04/28/20 0624  NA 140 139  K 4.6 4.0  CL 104 102  CO2 23 25  BUN 8 9  CREATININE 0.60 0.56  GLUCOSE 98 143*  CALCIUM 9.2 9.4   No results for input(s): LABPT, INR in the last 72 hours.  Exam: Pleasant female alert and oriented.  NAD.  aquacel dressing intact.      Assessment/Plan: D/c home today.  F/u in office 2 weeks postop. Dr Lorin Mercy sent in scripts for percocet and aspirin.      Benjiman Core 04/30/2020, 11:06 AM

## 2020-04-30 NOTE — Progress Notes (Signed)
Physical Therapy Treatment Patient Details Name: Deborah Barnes MRN: [id number] DOB: Jul 08, 1976 Today's Date: 04/30/2020    History of Present Illness Pt is a 44 y/o female s/p R THA, direct anterior. PMH includes COPD, HTN, PTSD, alcohol abuse and drug abuse.     PT Comments    Pt progressing towards physical therapy goals. Was able to perform transfers and ambulation with up to min guard assist, however is largely functioning at a mod I level in room. Pt continues to be limited most by R knee pain and had difficulty completing stair training because of this. Pt reports she can stay on the main level at her residence and does not plan on going upstairs to her room until she can walk without the walker. Will continue to follow and progress as able per POC.     Follow Up Recommendations  Follow surgeon's recommendation for DC plan and follow-up therapies     Equipment Recommendations  Rolling walker with 5" wheels    Recommendations for Other Services       Precautions / Restrictions Precautions Precautions: Fall Restrictions Weight Bearing Restrictions: Yes RLE Weight Bearing: Weight bearing as tolerated    Mobility  Bed Mobility Overal bed mobility: Modified Independent Bed Mobility: Supine to Sit;Sit to Supine     Supine to sit: Modified independent (Device/Increase time) Sit to supine: Modified independent (Device/Increase time)   General bed mobility comments: Utilizing gait belt as leg lifter per OT session.   Transfers Overall transfer level: Modified independent Equipment used: Rolling walker (2 wheeled) Transfers: Sit to/from Stand           General transfer comment: Increased time. Pt demonstrated proper hand placement on seated surface for safety.   Ambulation/Gait Ambulation/Gait assistance: Supervision;Min guard Gait Distance (Feet): 100 Feet Assistive device: Rolling walker (2 wheeled) Gait Pattern/deviations: Decreased step length -  right;Decreased step length - left;Antalgic;Step-through pattern Gait velocity: Decreased Gait velocity interpretation: <1.8 ft/sec, indicate of risk for recurrent falls General Gait Details: pt with increased R knee pain requiring increased dependence on bilat UEs on walker   Stairs Stairs: Yes Stairs assistance: Min assist Stair Management: Sideways;One rail Right;Backwards;No rails;With walker Number of Stairs: 2 General stair comments: Pt with one step sideways in which she started crying due to pain, and then attempted backwards. Pt appeared to tolerate backwards with walker better but again was limited by R knee pain.    Wheelchair Mobility    Modified Rankin (Stroke Patients Only)       Balance Overall balance assessment: Needs assistance Sitting-balance support: No upper extremity supported;Feet supported Sitting balance-Leahy Scale: Good     Standing balance support: Bilateral upper extremity supported;No upper extremity supported Standing balance-Leahy Scale: Poor Standing balance comment: dependent on RW                            Cognition Arousal/Alertness: Awake/alert Behavior During Therapy: WFL for tasks assessed/performed Overall Cognitive Status: Within Functional Limits for tasks assessed                                        Exercises      General Comments        Pertinent Vitals/Pain Pain Assessment: 0-10 Pain Score: 8  Faces Pain Scale: Hurts a little bit Pain Location: R knee Pain Descriptors / Indicators: Guarding;Sore;Discomfort Pain Intervention(s):  Limited activity within patient's tolerance;Monitored during session;Repositioned    Home Living                      Prior Function            PT Goals (current goals can now be found in the care plan section) Acute Rehab PT Goals Patient Stated Goal: to be independent so she can return to her halfway home PT Goal Formulation: With patient Time  For Goal Achievement: 05/11/20 Potential to Achieve Goals: Good Progress towards PT goals: Progressing toward goals    Frequency    7X/week      PT Plan Current plan remains appropriate    Co-evaluation              AM-PAC PT "6 Clicks" Mobility   Outcome Measure  Help needed turning from your back to your side while in a flat bed without using bedrails?: None Help needed moving from lying on your back to sitting on the side of a flat bed without using bedrails?: None Help needed moving to and from a bed to a chair (including a wheelchair)?: None Help needed standing up from a chair using your arms (e.g., wheelchair or bedside chair)?: None Help needed to walk in hospital room?: A Little Help needed climbing 3-5 steps with a railing? : A Little 6 Click Score: 22    End of Session Equipment Utilized During Treatment: Gait belt Activity Tolerance: Patient limited by pain Patient left: in chair;with call bell/phone within reach;with nursing/sitter in room Nurse Communication: Mobility status PT Visit Diagnosis: Other abnormalities of gait and mobility (R26.89);Pain Pain - Right/Left: Right Pain - part of body: Hip     Time: 1011-1040 PT Time Calculation (min) (ACUTE ONLY): 29 min  Charges:  $Gait Training: 23-37 mins                     Deborah Barnes, PT, DPT Acute Rehabilitation Services Pager: [phone] Office: [phone]    Deborah Barnes 04/30/2020, 2:41 PM

## 2020-04-30 NOTE — Progress Notes (Signed)
Occupational Therapy Treatment Patient Details Name: Deborah Barnes MRN: [id number] DOB: 1975-11-21 Today's Date: 04/30/2020    History of present illness Pt is a 44 y/o female s/p R THA, direct anterior. PMH includes COPD, HTN, PTSD, alcohol abuse and drug abuse.    OT comments  Patient continues to make steady progress towards goals in skilled OT session. Patient's session encompassed practice with AE to aid in independence for transfer home. Pt able to demonstrate good usage of AE, as well as good execution of using gait belt as leg lifter to aid in getting in and out of bed. Discharge remains appropriate, will continue to follow acutely.    Follow Up Recommendations  No OT follow up    Equipment Recommendations  3 in 1 bedside commode (bariatric)    Recommendations for Other Services      Precautions / Restrictions Precautions Precautions: Fall Restrictions Weight Bearing Restrictions: Yes RLE Weight Bearing: Weight bearing as tolerated       Mobility Bed Mobility Overal bed mobility: Independent Bed Mobility: Supine to Sit;Sit to Supine     Supine to sit: Modified independent (Device/Increase time) Sit to supine: Modified independent (Device/Increase time)   General bed mobility comments: Provided gait belt to use as a leg lifter to aid in independence in getting back to bed, good demonstration and teach back observed and noted  Transfers                      Balance Overall balance assessment: Needs assistance Sitting-balance support: No upper extremity supported;Feet supported Sitting balance-Leahy Scale: Good     Standing balance support: Bilateral upper extremity supported;No upper extremity supported Standing balance-Leahy Scale: Poor Standing balance comment: dependent on RW                           ADL either performed or assessed with clinical judgement   ADL Overall ADL's : Needs assistance/impaired                                      Functional mobility during ADLs: Modified independent;Rolling walker General ADL Comments: Session focus on practice with AE     Vision       Perception     Praxis      Cognition Arousal/Alertness: Awake/alert Behavior During Therapy: WFL for tasks assessed/performed Overall Cognitive Status: Within Functional Limits for tasks assessed                                          Exercises     Shoulder Instructions       General Comments      Pertinent Vitals/ Pain       Pain Assessment: Faces Faces Pain Scale: Hurts a little bit Pain Location: R knee and hip Pain Descriptors / Indicators: Guarding;Sore;Discomfort Pain Intervention(s): Monitored during session  Home Living                                          Prior Functioning/Environment              Frequency  Min 2X/week  Progress Toward Goals  OT Goals(current goals can now be found in the care plan section)  Progress towards OT goals: Progressing toward goals  Acute Rehab OT Goals Patient Stated Goal: to be independent so she can return to her halfway home OT Goal Formulation: With patient Time For Goal Achievement: 05/13/20 Potential to Achieve Goals: Good  Plan Discharge plan remains appropriate    Co-evaluation                 AM-PAC OT "6 Clicks" Daily Activity     Outcome Measure   Help from another person eating meals?: None Help from another person taking care of personal grooming?: None Help from another person toileting, which includes using toliet, bedpan, or urinal?: None Help from another person bathing (including washing, rinsing, drying)?: A Little Help from another person to put on and taking off regular upper body clothing?: None Help from another person to put on and taking off regular lower body clothing?: A Little 6 Click Score: 22    End of Session Equipment Utilized During Treatment: Gait  belt (provided as leg lifter for home)  OT Visit Diagnosis: Pain;Other abnormalities of gait and mobility (R26.89)   Activity Tolerance Patient tolerated treatment well   Patient Left in bed;with call bell/phone within reach   Nurse Communication Mobility status        Time: 1219-7588 OT Time Calculation (min): 13 min  Charges: OT General Charges $OT Visit: 1 Visit OT Treatments $Self Care/Home Management : 8-22 mins  Corinne Ports [place]. [place], [place] Acute Rehabilitation Services [place] 04/30/2020, 11:32 AM

## 2020-04-30 NOTE — Progress Notes (Signed)
Physical Therapy Treatment Patient Details Name: Deborah Barnes MRN: [id number] DOB: 27-Sep-1975 Today's Date: 04/30/2020    History of Present Illness Pt is a 44 y/o female s/p R THA, direct anterior. PMH includes COPD, HTN, PTSD, alcohol abuse and drug abuse.     PT Comments    R knee pain continues to limit functional mobility. Pt with sharp pain on medial side of knee with passive ROM to full extension, during weight bearing, and during active knee extension sitting (LAQ). We reviewed safe stair negotiation and handout provided for safety reminders. Pt anticipates d/c home as soon as her ride is available. Will continue to follow and progress as able per POC.    Follow Up Recommendations  Follow surgeon's recommendation for DC plan and follow-up therapies     Equipment Recommendations  Rolling walker with 5" wheels    Recommendations for Other Services       Precautions / Restrictions Precautions Precautions: Fall Restrictions Weight Bearing Restrictions: Yes RLE Weight Bearing: Weight bearing as tolerated    Mobility  Bed Mobility Overal bed mobility: Modified Independent Bed Mobility: Supine to Sit;Sit to Supine     General bed mobility comments: Utilizing gait belt as leg lifter per OT session.   Transfers Overall transfer level: Modified independent Equipment used: Rolling walker (2 wheeled) Transfers: Sit to/from Stand Sit to Stand: Supervision         General transfer comment: Increased time. Pt demonstrated proper hand placement on seated surface for safety.   Ambulation/Gait Ambulation/Gait assistance: Supervision;Min guard Gait Distance (Feet): 100 Feet Assistive device: Rolling walker (2 wheeled) Gait Pattern/deviations: Decreased step length - right;Decreased step length - left;Antalgic;Step-through pattern Gait velocity: Decreased Gait velocity interpretation: <1.8 ft/sec, indicate of risk for recurrent falls General Gait Details: Limited by  pain. Pt again tearful due to pain.    Stairs  General stair comments: Deferred stair training as pt about to go home and we opted not to get her knee any more aggrevated than it already is. Handout given for backwards negotiation of stairs with walker.   Wheelchair Mobility    Modified Rankin (Stroke Patients Only)       Balance Overall balance assessment: Needs assistance Sitting-balance support: No upper extremity supported;Feet supported Sitting balance-Leahy Scale: Good     Standing balance support: Bilateral upper extremity supported;No upper extremity supported Standing balance-Leahy Scale: Poor Standing balance comment: dependent on RW                            Cognition Arousal/Alertness: Awake/alert Behavior During Therapy: WFL for tasks assessed/performed Overall Cognitive Status: Within Functional Limits for tasks assessed                                      Verbally reviewed HEP - pt has handout  Exercises      General Comments        Pertinent Vitals/Pain Pain Assessment: 0-10 Pain Score: 8  Faces Pain Scale: Hurts a little bit Pain Location: R knee Pain Descriptors / Indicators: Guarding;Sore;Discomfort Pain Intervention(s): Limited activity within patient's tolerance;Monitored during session;Repositioned    Home Living                      Prior Function            PT Goals (current goals can now be  found in the care plan section) Acute Rehab PT Goals Patient Stated Goal: to be independent so she can return to her halfway home PT Goal Formulation: With patient Time For Goal Achievement: 05/11/20 Potential to Achieve Goals: Good Progress towards PT goals: Progressing toward goals    Frequency    7X/week      PT Plan Current plan remains appropriate    Co-evaluation              AM-PAC PT "6 Clicks" Mobility   Outcome Measure  Help needed turning from your back to your side while in  a flat bed without using bedrails?: None Help needed moving from lying on your back to sitting on the side of a flat bed without using bedrails?: None Help needed moving to and from a bed to a chair (including a wheelchair)?: None Help needed standing up from a chair using your arms (e.g., wheelchair or bedside chair)?: None Help needed to walk in hospital room?: A Little Help needed climbing 3-5 steps with a railing? : A Little 6 Click Score: 22    End of Session Equipment Utilized During Treatment: Gait belt Activity Tolerance: Patient limited by pain Patient left: in bed;with call bell/phone within reach Nurse Communication: Mobility status PT Visit Diagnosis: Other abnormalities of gait and mobility (R26.89);Pain Pain - Right/Left: Right Pain - part of body: Hip     Time: 7793-9030 PT Time Calculation (min) (ACUTE ONLY): 24 min  Charges:  $Gait Training: 23-37 mins                     Rolinda Roan, PT, DPT Acute Rehabilitation Services Pager: [phone] Office: [phone]    Thelma Comp 04/30/2020, 2:48 PM

## 2020-04-30 NOTE — Plan of Care (Signed)
Patient alert and oriented, mae's well, voiding adequate amount of urine, swallowing without difficulty, no c/o pain at time of discharge, medication given. Patient discharged home with caretaker.  Script and discharged instructions given to patient. Patient stated understanding of instructions given. Patient has an appointment with Dr. Lorin Mercy.2 weeks.

## 2020-05-01 ENCOUNTER — Encounter: Payer: Self-pay | Admitting: Orthopaedic Surgery

## 2020-05-04 ENCOUNTER — Telehealth: Payer: Self-pay

## 2020-05-04 ENCOUNTER — Telehealth: Payer: Self-pay | Admitting: Orthopaedic Surgery

## 2020-05-04 NOTE — Telephone Encounter (Signed)
I called and advised. 

## 2020-05-04 NOTE — Telephone Encounter (Signed)
Patient called stating that she is swollen from her right hip down to her right ankle and is having a sharp shooting pain in the right leg.  Patient had right hip surgery on 04/27/2020.  Would like to know if this is normal?  Cb# [phone].  Please advise.  Thank you.

## 2020-05-04 NOTE — Telephone Encounter (Signed)
Please advise 

## 2020-05-04 NOTE — Telephone Encounter (Signed)
Patient called requesting a call back. Patient said this is an updated note. Patient states leg is discolored. It is now a yellowish tint. Please call patient at [phone]. Sending message as urgent

## 2020-05-04 NOTE — Telephone Encounter (Signed)
I called patient. Duplicate message in the chart.

## 2020-05-04 NOTE — Telephone Encounter (Signed)
Yes that is as expected. Should be taking one aspirin daily.  [facility]call thanks

## 2020-05-05 NOTE — Discharge Summary (Signed)
Patient ID: Deborah Barnes MRN: [id number] DOB/AGE: 1976-01-13 44 y.o.  Admit date: 04/27/2020 Discharge date: 05/05/2020  Admission Diagnoses:  Active Problems:   Primary osteoarthritis of one hip, right   Arthritis of right hip   Status post total hip replacement, right   Discharge Diagnoses:  Active Problems:   Primary osteoarthritis of one hip, right   Arthritis of right hip   Status post total hip replacement, right  status post Procedure(s): TOTAL HIP ARTHROPLASTY DIRECT ANTERIOR  Past Medical History:  Diagnosis Date  . Asthma   . Back pain   . Bipolar disorder ([place])    DX in the past but not currently be treated  . Bulging disc   . Complication of anesthesia 2014   anesthesia ? caused depression/suicidal ideation per pt.  Marland Kitchen COPD (chronic obstructive pulmonary disease) ([place])   . De Quervain's tenosynovitis, right 10/2019   surgery  . Degenerative disc disease   . Depression   . Depression with anxiety   . DJD (degenerative joint disease)    right hip  . GERD (gastroesophageal reflux disease)   . Hepatitis C 08/2017   [place] for HEP C  . Hip pain, right   . History of alcohol abuse   . History of IBS   . History of mixed drug abuse ([place])   . Hypercholesterolemia   . Hypertension   . Hypoglycemia   . Hypothyroidism   . PTSD (post-traumatic stress disorder)   . Sciatica   . Shortness of breath   . Thyroid disease   . Vitamin D deficiency 02/2020  . Walker as ambulation aid     Surgeries: Procedure(s): TOTAL HIP ARTHROPLASTY DIRECT ANTERIOR on 04/27/2020   Consultants:   Discharged Condition: Improved  Hospital Course: Deborah Barnes is an 44 y.o. female who was admitted 04/27/2020 for operative treatment of right hip DJD. Patient failed conservative treatments (please see the history and physical for the specifics) and had severe unremitting pain that affects sleep, daily activities and work/hobbies. After pre-op clearance, the patient was taken to the  operating room on 04/27/2020 and underwent  Procedure(s): TOTAL HIP ARTHROPLASTY DIRECT ANTERIOR.    Patient was given perioperative antibiotics:  Anti-infectives (From admission, onward)   Start     Dose/Rate Route Frequency Ordered Stop   04/27/20 1101  ceFAZolin (ANCEF) 2-4 GM/100ML-% IVPB       Note to Pharmacy: Gleason, Ginger   : cabinet override      04/27/20 1101 04/27/20 1321   04/27/20 1100  ceFAZolin (ANCEF) IVPB 2g/100 mL premix        2 g 200 mL/hr over 30 Minutes Intravenous On call to O.R. 04/27/20 1046 04/27/20 1320       Patient was given sequential compression devices and early ambulation to prevent DVT.   Patient benefited maximally from hospital stay and there were no complications. At the time of discharge, the patient was urinating/moving their bowels without difficulty, tolerating a regular diet, pain is controlled with oral pain medications and they have been cleared by PT/OT.   Recent vital signs: No data found.   Recent laboratory studies: No results for input(s): WBC, HGB, HCT, PLT, NA, K, CL, CO2, BUN, CREATININE, GLUCOSE, INR, CALCIUM in the last 72 hours.  Invalid input(s): PT, 2   Discharge Medications:   Allergies as of 04/30/2020      Reactions   Clarithromycin Diarrhea   abd pain   Propoxyphene Itching      Medication List  STOP taking these medications   acetaminophen 500 MG tablet Commonly known as: TYLENOL     TAKE these medications   aspirin 325 MG tablet Commonly known as: Bayer Aspirin Take 1 tablet (325 mg total) by mouth daily. Take one daily for 4 wks then stop   citalopram 20 MG tablet Commonly known as: CELEXA TAKE 1 TABLET (20 MG TOTAL) BY MOUTH DAILY.   ibuprofen 200 MG tablet Commonly known as: ADVIL Take 800 mg by mouth every 8 (eight) hours as needed for fever or moderate pain.   loratadine 10 MG tablet Commonly known as: CLARITIN TAKE 1 Tablet BY MOUTH ONCE DAILY   Melatonin 10 MG Tabs Take 40 mg by mouth at  bedtime.   metoprolol tartrate 50 MG tablet Commonly known as: LOPRESSOR TAKE 1 Tablet  BY MOUTH TWICE DAILY   omeprazole 40 MG capsule Commonly known as: PRILOSEC TAKE 1 Capsule BY MOUTH ONCE DAILY   OVER THE COUNTER MEDICATION Take 2 capsules by mouth 2 (two) times daily as needed (pain). Hemp Capsules   OVER THE COUNTER MEDICATION Take 1 drop by mouth 2 (two) times daily as needed (pain). CBD OIL   oxyCODONE-acetaminophen 5-325 MG tablet Commonly known as: Percocet Take 1-2 tablets by mouth every 6 (six) hours as needed for severe pain. Post total hip arthroplasty  pain   Proventil HFA 108 (90 Base) MCG/ACT inhaler Generic drug: albuterol INHALE 1 TO 2 PUFFS BY MOUTH EVERY 6 HOURS AS NEEDED FOR COUGHING, WHEEZING, OR SHORTNESS OF BREATH What changed: See the new instructions.   simethicone 125 MG chewable tablet Commonly known as: MYLICON Chew 778 mg by mouth every 6 (six) hours as needed for flatulence.   Synthroid 25 MCG tablet Generic drug: levothyroxine TAKE 1 Tablet BY MOUTH ONCE DAILY BEFORE BREAKFAST What changed: See the new instructions.       Diagnostic Studies: DG C-Arm 1-60 Min  Result Date: 04/27/2020 CLINICAL DATA:  Right hip replacement. EXAM: DG C-ARM 1-60 MIN; OPERATIVE RIGHT HIP WITH PELVIS FLUOROSCOPY TIME:  Fluoroscopy Time:  33 seconds Radiation Exposure Index (if provided by the fluoroscopic device): 6.68 mGy Number of Acquired Spot Images: 4 COMPARISON:  Hip radiograph 04/17/2020 FINDINGS: Four fluoroscopic spot images obtained in the operating room. Interval right hip arthroplasty. Fluoroscopy time 33 seconds. Total dose 6.68 mGy. IMPRESSION: Intraoperative fluoroscopy during right hip arthroplasty. Electronically Signed   By: Keith Rake M.D.   On: 04/27/2020 15:26   DG Hip Port Unilat With Pelvis 1V Right  Result Date: 04/27/2020 CLINICAL DATA:  Postop hip replacement. EXAM: DG HIP (WITH OR WITHOUT PELVIS) 1V PORT RIGHT COMPARISON:  April 17, 2020 FINDINGS: Patient has undergone total hip arthroplasty on the right. The alignment appears near anatomic. There are expected postsurgical changes. There is no acute fracture. IMPRESSION: Status post right total hip arthroplasty without evidence of hardware complication. Electronically Signed   By: Constance Holster M.D.   On: 04/27/2020 16:14   DG HIP OPERATIVE UNILAT WITH PELVIS RIGHT  Result Date: 04/27/2020 CLINICAL DATA:  Right hip replacement. EXAM: DG C-ARM 1-60 MIN; OPERATIVE RIGHT HIP WITH PELVIS FLUOROSCOPY TIME:  Fluoroscopy Time:  33 seconds Radiation Exposure Index (if provided by the fluoroscopic device): 6.68 mGy Number of Acquired Spot Images: 4 COMPARISON:  Hip radiograph 04/17/2020 FINDINGS: Four fluoroscopic spot images obtained in the operating room. Interval right hip arthroplasty. Fluoroscopy time 33 seconds. Total dose 6.68 mGy. IMPRESSION: Intraoperative fluoroscopy during right hip arthroplasty. Electronically Signed  By: Keith Rake M.D.   On: 04/27/2020 15:26       Follow-up Information    Marybelle Killings, MD Follow up in 1 week(s).   Specialty: Orthopedic Surgery Contact information: [address] [place] [address] [phone]               Discharge Plan:  discharge to home  Disposition:     Signed: Benjiman Core  For Rodell Perna MD 05/05/2020, 10:28 AM

## 2020-05-07 ENCOUNTER — Telehealth: Payer: Self-pay | Admitting: Orthopaedic Surgery

## 2020-05-07 NOTE — Telephone Encounter (Signed)
Pt states she believes she got some fluid gathering around her incision site; pt had surgery aug. 9, 2021. Pt states it's red and hot, so I let her speak with triage.   [phone]

## 2020-05-07 NOTE — Telephone Encounter (Signed)
Patient to be added to Lindsay's schedule tomorrow.

## 2020-05-08 ENCOUNTER — Encounter: Payer: Self-pay | Admitting: Physician Assistant

## 2020-05-08 ENCOUNTER — Ambulatory Visit (INDEPENDENT_AMBULATORY_CARE_PROVIDER_SITE_OTHER): Payer: Medicaid Other | Admitting: Physician Assistant

## 2020-05-08 VITALS — Ht 65.0 in | Wt 275.2 lb

## 2020-05-08 DIAGNOSIS — Z96641 Presence of right artificial hip joint: Secondary | ICD-10-CM
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
## 2020-05-12 ENCOUNTER — Ambulatory Visit (INDEPENDENT_AMBULATORY_CARE_PROVIDER_SITE_OTHER): Payer: Medicaid Other | Admitting: Orthopaedic Surgery

## 2020-05-12 ENCOUNTER — Encounter: Payer: Self-pay | Admitting: Orthopaedic Surgery

## 2020-05-12 ENCOUNTER — Ambulatory Visit (INDEPENDENT_AMBULATORY_CARE_PROVIDER_SITE_OTHER): Payer: Self-pay

## 2020-05-12 VITALS — Ht 65.0 in | Wt 275.0 lb

## 2020-05-12 DIAGNOSIS — Z96641 Presence of right artificial hip joint: Secondary | ICD-10-CM
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
## 2020-05-13 ENCOUNTER — Ambulatory Visit (INDEPENDENT_AMBULATORY_CARE_PROVIDER_SITE_OTHER): Payer: Self-pay | Admitting: Orthopaedic Surgery

## 2020-05-13 ENCOUNTER — Telehealth: Payer: Self-pay | Admitting: Orthopaedic Surgery

## 2020-05-13 ENCOUNTER — Other Ambulatory Visit: Payer: Self-pay

## 2020-05-13 ENCOUNTER — Encounter: Payer: Self-pay | Admitting: Orthopaedic Surgery

## 2020-05-13 VITALS — Ht 65.0 in | Wt 275.0 lb

## 2020-05-13 DIAGNOSIS — Z96641 Presence of right artificial hip joint: Secondary | ICD-10-CM

## 2020-05-13 NOTE — Telephone Encounter (Signed)
Patient called requesting a call back. Patient came in yesterday for stitch removal and complaints of open wound. Please call patient back as soon as possible at [phone].

## 2020-05-13 NOTE — Telephone Encounter (Signed)
Patient came in for appt for wound check.

## 2020-05-13 NOTE — Telephone Encounter (Signed)
I called patient. She feels that top of incision is coming open. She has noticed a little drainage. Does not know if steri strip has come off. Offered appt today, patient could not make it due to ride. Worked in on [place] schedule tomorrow for check. Advised patient that she can clean with betadine if strips have come off and cover with bandage. Advised patient to change bandage if it becomes wet from drainage.
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
## 2020-05-14 ENCOUNTER — Ambulatory Visit: Payer: Medicaid Other | Admitting: Surgery

## 2020-05-14 ENCOUNTER — Ambulatory Visit: Payer: Medicaid Other | Admitting: Gastroenterology

## 2020-05-19 ENCOUNTER — Encounter: Payer: Self-pay | Admitting: Family Medicine

## 2020-05-22 ENCOUNTER — Other Ambulatory Visit: Payer: Self-pay

## 2020-05-22 ENCOUNTER — Ambulatory Visit: Payer: Medicaid Other

## 2020-05-26 ENCOUNTER — Ambulatory Visit: Payer: Self-pay | Admitting: "Endocrinology

## 2020-05-27 MED FILL — ?CITALOPRAM HBR 20 MG TABLE: 20 | 30 days supply | Qty: 30 | Fill #3

## 2020-05-28 ENCOUNTER — Ambulatory Visit (INDEPENDENT_AMBULATORY_CARE_PROVIDER_SITE_OTHER): Payer: Self-pay | Admitting: "Endocrinology

## 2020-05-28 ENCOUNTER — Encounter: Payer: Self-pay | Admitting: "Endocrinology

## 2020-05-28 ENCOUNTER — Other Ambulatory Visit: Payer: Self-pay

## 2020-05-28 ENCOUNTER — Ambulatory Visit (INDEPENDENT_AMBULATORY_CARE_PROVIDER_SITE_OTHER): Payer: Self-pay | Admitting: Gastroenterology

## 2020-05-28 ENCOUNTER — Encounter: Payer: Self-pay | Admitting: Gastroenterology

## 2020-05-28 VITALS — BP 118/72 | HR 72 | Ht 64.0 in | Wt 272.0 lb

## 2020-05-28 VITALS — BP 120/78 | HR 64 | Temp 97.0°F | Ht 64.0 in | Wt 270.8 lb

## 2020-05-28 DIAGNOSIS — E039 Hypothyroidism, unspecified: Secondary | ICD-10-CM

## 2020-05-28 DIAGNOSIS — R7303 Prediabetes: Secondary | ICD-10-CM | POA: Insufficient documentation

## 2020-05-28 DIAGNOSIS — K580 Irritable bowel syndrome with diarrhea: Secondary | ICD-10-CM

## 2020-05-28 DIAGNOSIS — K219 Gastro-esophageal reflux disease without esophagitis: Secondary | ICD-10-CM

## 2020-05-28 MED ORDER — RIFAXIMIN 550 MG PO TABS: 550.0000 mg | ORAL_TABLET | Freq: Three times a day (TID) | ORAL | 0 refills | Status: AC

## 2020-05-28 MED ORDER — LEVOTHYROXINE SODIUM 50 MCG PO TABS: 50.0000 ug | ORAL_TABLET | Freq: Every day | ORAL | 1 refills | Status: DC

## 2020-05-28 MED FILL — !XIFAXAN 550 MG TABLET: 550 | 14 days supply | Qty: 42 | Fill #0

## 2020-05-28 NOTE — Patient Instructions (Signed)
I have sent in Xifaxan to take three times a day for 14 days.   Please let me know if this does not help. If you start having watery, profuse diarrhea that is every day, please let me know.  Any worsening symptoms, please call, including abdominal pain.  Enjoy your time tomorrow!  I will see you in 6 weeks!  I enjoyed seeing you again today! As you know, I value our relationship and want to provide genuine, compassionate, and quality care. I welcome your feedback. If you receive a survey regarding your visit,  I greatly appreciate you taking time to fill this out. See you next time!  Annitta Needs, PhD, ANP-BC [facility]

## 2020-05-28 NOTE — Patient Instructions (Signed)

## 2020-05-28 NOTE — Progress Notes (Signed)
Referring Provider: Lanae Boast, FNP Primary Care Physician:  Azzie Glatter, FNP Primary GI: Dr. Gala Romney   Chief Complaint  Patient presents with  . Diarrhea    comes/goes that can last 3-4 days  . Abdominal Pain    mid-lower abd, comes/goes, comes in spurts. Before pain she gets gas and belching    HPI:   Deborah Barnes is a 44 y.o. female presenting today with a history of history of chronic Hep Cgenotype 1a, Metavir F3/F4. No evidence of chronic Hep B. First dose of Harvoni on 9/16. Shecompleted Harvoni in Dec 2019. She attained SVR. Will be following with serial [place]. Could consider biopsy to determine fibrosis status and need for surveillance. Colonoscopy due again in 2023 due to history of multiple adenomas. EGD done in interim from last appt with dysphagia, s/p empiric dilation.   History of IBS with diarrhea in the past.  Trying to eat healthy and make changes. In last 3 months has had worsening of symptoms, more easily triggered and lasting longer. Has to stop eating to get it to stop. Will go to the [employer]. Will have normal BMs in between flares. Will have multiple softstools on a flare. Soft, milkshake consistency. Processed foods, junk food will sometimes trigger. Avoiding dairy for most part except occasional ice cream. Will have lower abdominal cramping/sore/achy that has been chronic and precedes flares in stool frequency. Night before will have bad gas or belch uncontrollably.   Past Medical History:  Diagnosis Date  . Asthma   . Back pain   . Bipolar disorder ([place])    DX in the past but not currently be treated  . Bulging disc   . Complication of anesthesia 2014   anesthesia ? caused depression/suicidal ideation per pt.  Marland Kitchen COPD (chronic obstructive pulmonary disease) ([place])   . De Quervain's tenosynovitis, right 10/2019   surgery  . Degenerative disc disease   . Depression   . Depression with anxiety   . DJD (degenerative joint disease)    right hip  .  GERD (gastroesophageal reflux disease)   . Hepatitis C 08/2017   TX for HEP C  . Hip pain, right   . History of alcohol abuse   . History of IBS   . History of mixed drug abuse ([place])   . Hypercholesterolemia   . Hypertension   . Hypoglycemia   . Hypothyroidism   . PTSD (post-traumatic stress disorder)   . Sciatica   . Shortness of breath   . Thyroid disease   . Vitamin D deficiency 02/2020  . Walker as ambulation aid     Past Surgical History:  Procedure Laterality Date  . BIOPSY N/A 09/25/2014   Procedure: BIOPSY;  Surgeon: Daneil Dolin, MD;  Location: [facility];  Service: Endoscopy;  Laterality: N/A;  Gastric, Ascending Colon, Descending/Sigmoid Colon   . CARPAL TUNNEL RELEASE Bilateral   . CHOLECYSTECTOMY N/A 04/26/2013   Procedure: LAPAROSCOPIC CHOLECYSTECTOMY;  Surgeon: Jamesetta So, MD;  Location: [facility];  Service: General;  Laterality: N/A;  . COLONOSCOPY WITH PROPOFOL N/A 09/25/2014   RMR: Pancolonic diverticulosis. multiple tubular adenomas, segmental biopsies negative. surveillance in 5 years  . COLONOSCOPY WITH PROPOFOL N/A 01/16/2020   diverticulosis, two 4-6 mm polyps in sigmoid and splenic flexure, one 10 mm polyp in sigmoid Tubular adenomas, 3 year surveillance.  . DORSAL COMPARTMENT RELEASE Right 10/30/2019   Procedure: RIGHT WRIST 1ST  DORSAL COMPARTMENT RELEASE (DEQUERVAIN);  Surgeon: Marybelle Killings, MD;  Location: [facility];  Service: Orthopedics;  Laterality: Right;  . ESOPHAGOGASTRODUODENOSCOPY (EGD) WITH PROPOFOL N/A 09/25/2014   RMR: Normal esophagus. Abnormal gastric mucosa status post biopsy (negative H.pylori). Hiatal hernia.   Marland Kitchen ESOPHAGOGASTRODUODENOSCOPY (EGD) WITH PROPOFOL N/A 01/16/2020   Normal esophagus s/p dilation, normal stomach and duodenal bulb.   Fatima Blank HERNIA REPAIR N/A 10/04/2013   Procedure: HERNIA REPAIR INCISIONAL WITH MESH;  Surgeon: Jamesetta So, MD;  Location: [facility];  Service: General;  Laterality: N/A;  . INSERTION OF MESH N/A 10/04/2013    Procedure: INSERTION OF MESH;  Surgeon: Jamesetta So, MD;  Location: [facility];  Service: General;  Laterality: N/A;  Venia Minks DILATION N/A 01/16/2020   Procedure: Keturah Shavers;  Surgeon: Daneil Dolin, MD;  [facility];  Service: Endoscopy;  Laterality: N/A;  . POLYPECTOMY N/A 09/25/2014   Procedure: POLYPECTOMY;  Surgeon: Daneil Dolin, MD;  Location: [facility];  Service: Endoscopy;  Laterality: N/A;  Cecal, Ascending Colon   . POLYPECTOMY  01/16/2020   Procedure: POLYPECTOMY;  Surgeon: Daneil Dolin, MD;  [facility];  Service: Endoscopy;;  . SHOULDER SURGERY Left   . TOOTH EXTRACTION  08/09/2019  . TOTAL HIP ARTHROPLASTY Right 04/27/2020   Procedure: TOTAL HIP ARTHROPLASTY DIRECT ANTERIOR;  Surgeon: Marybelle Killings, MD;  Location: [facility];  Service: Orthopedics;  Laterality: Right;  . TUBAL LIGATION    . WISDOM TOOTH EXTRACTION      Current Outpatient Medications  Medication Sig Dispense Refill  . citalopram (CELEXA) 20 MG tablet TAKE 1 TABLET (20 MG TOTAL) BY MOUTH DAILY. 30 tablet 6  . ibuprofen (ADVIL,MOTRIN) 200 MG tablet Take 800 mg by mouth every 8 (eight) hours as needed for fever or moderate pain.     Marland Kitchen loratadine (CLARITIN) 10 MG tablet TAKE 1 Tablet BY MOUTH ONCE DAILY (Patient taking differently: Take 10 mg by mouth daily. ) 90 tablet 2  . Melatonin 10 MG TABS Take 40 mg by mouth at bedtime.     . metoprolol tartrate (LOPRESSOR) 50 MG tablet TAKE 1 Tablet  BY MOUTH TWICE DAILY (Patient taking differently: Take 50 mg by mouth 2 (two) times daily. ) 180 tablet 2  . omeprazole (PRILOSEC) 40 MG capsule TAKE 1 Capsule BY MOUTH ONCE DAILY (Patient taking differently: Take 40 mg by mouth daily. ) 90 capsule 3  . OVER THE COUNTER MEDICATION Take 2 capsules by mouth 2 (two) times daily as needed (pain). Hemp Capsules    . PROVENTIL HFA 108 (90 Base) MCG/ACT inhaler INHALE 1 TO 2 PUFFS BY MOUTH EVERY 6 HOURS AS NEEDED FOR COUGHING, WHEEZING, OR SHORTNESS OF BREATH  (Patient taking differently: Inhale 1-2 puffs into the lungs every 6 (six) hours as needed for wheezing or shortness of breath. ) 20.1 g 1  . simethicone (MYLICON) 229 MG chewable tablet Chew 125 mg by mouth every 6 (six) hours as needed for flatulence.    Marland Kitchen SYNTHROID 25 MCG tablet TAKE 1 Tablet BY MOUTH ONCE DAILY BEFORE BREAKFAST (Patient taking differently: Take 25 mcg by mouth daily before breakfast. ) 90 tablet 1  . aspirin (BAYER ASPIRIN) 325 MG tablet Take 1 tablet (325 mg total) by mouth daily. Take one daily for 4 wks then stop (Patient not taking: Reported on 05/28/2020)    . OVER THE COUNTER MEDICATION Take 1 drop by mouth 2 (two) times daily as needed (pain). CBD OIL (Patient not taking: Reported on 05/28/2020)    .  oxyCODONE-acetaminophen (PERCOCET) 5-325 MG tablet Take 1-2 tablets by mouth every 6 (six) hours as needed for severe pain. Post total hip arthroplasty  pain (Patient not taking: Reported on 05/12/2020) 30 tablet 0   No current facility-administered medications for this visit.    Allergies as of 05/28/2020 - Review Complete 05/28/2020  Allergen Reaction Noted  . Clarithromycin Diarrhea 02/22/2012  . Propoxyphene Itching 01/25/2018    Family History  Problem Relation Age of Onset  . Diabetes Father   . Colon cancer Maternal Grandmother   . Heart disease Paternal Grandmother   . Cancer Paternal Grandmother        esophageal  . Hypertension Paternal Grandmother   . GER disease Paternal Grandmother   . Osteoporosis Paternal Grandmother     Social History   Socioeconomic History  . Marital status: Divorced    Spouse name: Not on file  . Number of children: Not on file  . Years of education: Not on file  . Highest education level: Not on file  Occupational History  . Not on file  Tobacco Use  . Smoking status: Former Smoker    Packs/day: 0.50    Years: 27.00    Pack years: 13.50    Quit date: 08/24/2018    Years since quitting: 1.7  . Smokeless tobacco: Never  Used  . Tobacco comment: vape former user  Vaping Use  . Vaping Use: Former  . Quit date: 08/24/2018  Substance and Sexual Activity  . Alcohol use: Not Currently    Alcohol/week: 1.0 standard drink    Types: 1 Cans of beer per week    Comment: hx binge drinker. none since 01/23/18  . Drug use: Not Currently    Types: Marijuana, Cocaine, Methamphetamines, Heroin    Comment: no cocaine, heroin, meth since 01/23/18. last marijuana use 01-23-18  . Sexual activity: Not Currently    Birth control/protection: Surgical    Comment: tubal ligation  Other Topics Concern  . Not on file  Social History Narrative  . Not on file   Social Determinants of Health   Financial Resource Strain:   . Difficulty of Paying Living Expenses: Not on file  Food Insecurity:   . Worried About Charity fundraiser in the Last Year: Not on file  . Ran Out of Food in the Last Year: Not on file  Transportation Needs:   . Lack of Transportation (Medical): Not on file  . Lack of Transportation (Non-Medical): Not on file  Physical Activity:   . Days of Exercise per Week: Not on file  . Minutes of Exercise per Session: Not on file  Stress:   . Feeling of Stress : Not on file  Social Connections:   . Frequency of Communication with Friends and Family: Not on file  . Frequency of Social Gatherings with Friends and Family: Not on file  . Attends Religious Services: Not on file  . Active Member of Clubs or Organizations: Not on file  . Attends Archivist Meetings: Not on file  . Marital Status: Not on file    Review of Systems: Gen: Denies fever, chills, anorexia. Denies fatigue, weakness, weight loss.  CV: Denies chest pain, palpitations, syncope, peripheral edema, and claudication. Resp: Denies dyspnea at rest, cough, wheezing, coughing up blood, and pleurisy. GI: see HPI Derm: Denies rash, itching, dry skin Psych: Denies depression, anxiety, memory loss, confusion. No homicidal or suicidal ideation.   Heme: Denies bruising, bleeding, and enlarged lymph nodes.  Physical  Exam: BP 120/78   Pulse 64   Temp (!) 97 F (36.1 C)   Ht 5\' 4"  (1.626 m)   Wt 270 lb 12.8 oz (122.8 kg)   LMP 05/02/2020   BMI 46.48 kg/m  General:   Alert and oriented. No distress noted. Pleasant and cooperative.  Head:  Normocephalic and atraumatic. Eyes:  Conjuctiva clear without scleral icterus. Mouth:  Mask in place Abdomen:  +BS, soft,  Mild TTP RLQ and non-distended. No rebound or guarding. No HSM or masses noted. Msk:  Symmetrical without gross deformities. Normal posture. Extremities:  Without edema. Neurologic:  Alert and  oriented x4 Psych:  Alert and cooperative. Normal mood and affect.  ASSESSMENT/PLAN: Marlena Barbato is a 44 y.o. female presenting today with history of Hep C s/p eradication and SVR, serial ultrasounds for elevated fibrosis score and next due in Nov 2021, history of IBS now with increasing symptoms.  Doubt dealing with infectious etiology, as she has spans of normal BMs and stool is more "soft" and not watery. Known history of IBS. Will treat with Xifaxan TID for 2 weeks. As she is in a recovery house, other options are limited, and she is unsure if something such as dicyclomine or levsin would be allowed.   Will see her back for close follow-up in 6 weeks and arrange [place] at that time.   Annitta Needs, PhD, ANP-BC [facility]

## 2020-05-28 NOTE — Progress Notes (Signed)
05/28/2020, 1:49 PM  Endocrinology follow-up note   Subjective:    Patient ID: Deborah Barnes, female    DOB: 12/20/1975, PCP Azzie Glatter, FNP   Past Medical History:  Diagnosis Date  . Asthma   . Back pain   . Bipolar disorder ([place])    DX in the past but not currently be treated  . Bulging disc   . Complication of anesthesia 2014   anesthesia ? caused depression/suicidal ideation per pt.  Marland Kitchen COPD (chronic obstructive pulmonary disease) ([place])   . De Quervain's tenosynovitis, right 10/2019   surgery  . Degenerative disc disease   . Depression   . Depression with anxiety   . DJD (degenerative joint disease)    right hip  . GERD (gastroesophageal reflux disease)   . Hepatitis C 08/2017   TX for HEP C  . Hip pain, right   . History of alcohol abuse   . History of IBS   . History of mixed drug abuse ([place])   . Hypercholesterolemia   . Hypertension   . Hypoglycemia   . Hypothyroidism   . PTSD (post-traumatic stress disorder)   . Sciatica   . Shortness of breath   . Thyroid disease   . Vitamin D deficiency 02/2020  . Walker as ambulation aid    Past Surgical History:  Procedure Laterality Date  . BIOPSY N/A 09/25/2014   Procedure: BIOPSY;  Surgeon: Daneil Dolin, MD;  [facility];  Service: Endoscopy;  Laterality: N/A;  Gastric, Ascending Colon, Descending/Sigmoid Colon   . CARPAL TUNNEL RELEASE Bilateral   . CHOLECYSTECTOMY N/A 04/26/2013   Procedure: LAPAROSCOPIC CHOLECYSTECTOMY;  Surgeon: Jamesetta So, MD;  [facility]: [facility];  Service: General;  Laterality: N/A;  . COLONOSCOPY WITH PROPOFOL N/A 09/25/2014   RMR: Pancolonic diverticulosis. multiple tubular adenomas, segmental biopsies negative. surveillance in 5 years  . COLONOSCOPY WITH PROPOFOL N/A 01/16/2020   diverticulosis, two 4-6 mm polyps in sigmoid and splenic flexure, one 10 mm polyp in sigmoid Tubular adenomas, 3 year surveillance.  . DORSAL  COMPARTMENT RELEASE Right 10/30/2019   Procedure: RIGHT WRIST 1ST  DORSAL COMPARTMENT RELEASE (DEQUERVAIN);  Surgeon: Marybelle Killings, MD;  Location: [facility];  Service: Orthopedics;  Laterality: Right;  . ESOPHAGOGASTRODUODENOSCOPY (EGD) WITH PROPOFOL N/A 09/25/2014   RMR: Normal esophagus. Abnormal gastric mucosa status post biopsy (negative H.pylori). Hiatal hernia.   Marland Kitchen ESOPHAGOGASTRODUODENOSCOPY (EGD) WITH PROPOFOL N/A 01/16/2020   Normal esophagus s/p dilation, normal stomach and duodenal bulb.   Fatima Blank HERNIA REPAIR N/A 10/04/2013   Procedure: HERNIA REPAIR INCISIONAL WITH MESH;  Surgeon: Jamesetta So, MD;  [facility];  Service: General;  Laterality: N/A;  . INSERTION OF MESH N/A 10/04/2013   Procedure: INSERTION OF MESH;  Surgeon: Jamesetta So, MD;  [facility];  Service: General;  Laterality: N/A;  Venia Minks DILATION N/A 01/16/2020   Procedure: Keturah Shavers;  Surgeon: Daneil Dolin, MD;  [facility];  Service: Endoscopy;  Laterality: N/A;  . POLYPECTOMY N/A 09/25/2014   Procedure: POLYPECTOMY;  Surgeon: Daneil Dolin, MD;  [facility];  Service: Endoscopy;  Laterality: N/A;  Cecal, Ascending Colon   . POLYPECTOMY  01/16/2020  Procedure: POLYPECTOMY;  Surgeon: Daneil Dolin, MD;  [facility];  Service: Endoscopy;;  . SHOULDER SURGERY Left   . TOOTH EXTRACTION  08/09/2019  . TOTAL HIP ARTHROPLASTY Right 04/27/2020   Procedure: TOTAL HIP ARTHROPLASTY DIRECT ANTERIOR;  Surgeon: Marybelle Killings, MD;  Location: [facility];  Service: Orthopedics;  Laterality: Right;  . TUBAL LIGATION    . WISDOM TOOTH EXTRACTION     Social History   Socioeconomic History  . Marital status: Divorced    Spouse name: Not on file  . Number of children: Not on file  . Years of education: Not on file  . Highest education level: Not on file  Occupational History  . Not on file  Tobacco Use  . Smoking status: Former Smoker    Packs/day: 0.50    Years: 27.00    Pack  years: 13.50    Quit date: 08/24/2018    Years since quitting: 1.7  . Smokeless tobacco: Never Used  . Tobacco comment: vape former user  Vaping Use  . Vaping Use: Former  . Quit date: 08/24/2018  Substance and Sexual Activity  . Alcohol use: Not Currently    Alcohol/week: 1.0 standard drink    Types: 1 Cans of beer per week    Comment: hx binge drinker. none since 01/23/18  . Drug use: Not Currently    Types: Marijuana, Cocaine, Methamphetamines, Heroin    Comment: no cocaine, heroin, meth since 01/23/18. last marijuana use 01-23-18  . Sexual activity: Not Currently    Birth control/protection: Surgical    Comment: tubal ligation  Other Topics Concern  . Not on file  Social History Narrative  . Not on file   Social Determinants of Health   Financial Resource Strain:   . Difficulty of Paying Living Expenses: Not on file  Food Insecurity:   . Worried About Charity fundraiser in the Last Year: Not on file  . Ran Out of Food in the Last Year: Not on file  Transportation Needs:   . Lack of Transportation (Medical): Not on file  . Lack of Transportation (Non-Medical): Not on file  Physical Activity:   . Days of Exercise per Week: Not on file  . Minutes of Exercise per Session: Not on file  Stress:   . Feeling of Stress : Not on file  Social Connections:   . Frequency of Communication with Friends and Family: Not on file  . Frequency of Social Gatherings with Friends and Family: Not on file  . Attends Religious Services: Not on file  . Active Member of Clubs or Organizations: Not on file  . Attends Archivist Meetings: Not on file  . Marital Status: Not on file   Outpatient Encounter Medications as of 05/28/2020  Medication Sig  . citalopram (CELEXA) 20 MG tablet TAKE 1 TABLET (20 MG TOTAL) BY MOUTH DAILY.  Marland Kitchen ibuprofen (ADVIL,MOTRIN) 200 MG tablet Take 800 mg by mouth every 8 (eight) hours as needed for fever or moderate pain.   Marland Kitchen levothyroxine (SYNTHROID) 50 MCG tablet  Take 1 tablet (50 mcg total) by mouth daily before breakfast.  . loratadine (CLARITIN) 10 MG tablet TAKE 1 Tablet BY MOUTH ONCE DAILY (Patient taking differently: Take 10 mg by mouth daily. )  . Melatonin 10 MG TABS Take 40 mg by mouth at bedtime.   . metoprolol tartrate (LOPRESSOR) 50 MG tablet TAKE 1 Tablet  BY MOUTH TWICE DAILY (Patient taking differently: Take 50 mg by  mouth 2 (two) times daily. )  . omeprazole (PRILOSEC) 40 MG capsule TAKE 1 Capsule BY MOUTH ONCE DAILY (Patient taking differently: Take 40 mg by mouth daily. )  . OVER THE COUNTER MEDICATION Take 2 capsules by mouth 2 (two) times daily as needed (pain). Hemp Capsules  . PROVENTIL HFA 108 (90 Base) MCG/ACT inhaler INHALE 1 TO 2 PUFFS BY MOUTH EVERY 6 HOURS AS NEEDED FOR COUGHING, WHEEZING, OR SHORTNESS OF BREATH (Patient taking differently: Inhale 1-2 puffs into the lungs every 6 (six) hours as needed for wheezing or shortness of breath. )  . rifaximin (XIFAXAN) 550 MG TABS tablet Take 1 tablet (550 mg total) by mouth 3 (three) times daily for 14 days.  . simethicone (MYLICON) 850 MG chewable tablet Chew 125 mg by mouth every 6 (six) hours as needed for flatulence.  . [DISCONTINUED] aspirin (BAYER ASPIRIN) 325 MG tablet Take 1 tablet (325 mg total) by mouth daily. Take one daily for 4 wks then stop (Patient not taking: Reported on 05/28/2020)  . [DISCONTINUED] OVER THE COUNTER MEDICATION Take 1 drop by mouth 2 (two) times daily as needed (pain). CBD OIL (Patient not taking: Reported on 05/28/2020)  . [DISCONTINUED] oxyCODONE-acetaminophen (PERCOCET) 5-325 MG tablet Take 1-2 tablets by mouth every 6 (six) hours as needed for severe pain. Post total hip arthroplasty  pain (Patient not taking: Reported on 05/12/2020)  . [DISCONTINUED] SYNTHROID 25 MCG tablet TAKE 1 Tablet BY MOUTH ONCE DAILY BEFORE BREAKFAST (Patient taking differently: Take 25 mcg by mouth daily before breakfast. )   No facility-administered encounter medications on file as  of 05/28/2020.   ALLERGIES: Allergies  Allergen Reactions  . Clarithromycin Diarrhea    abd pain  . Propoxyphene Itching    VACCINATION STATUS: Immunization History  Administered Date(s) Administered  . Influenza,inj,Quad PF,6+ Mos 05/29/2019  . Pneumococcal Polysaccharide-23 04/26/2013  . Tdap 11/01/2018    HPI Deborah Barnes is 44 y.o. female who presents today with a medical history as above.  After missing her appointment since August 2019, she is returning for follow-up of hypothyroidism.  During her prior encounter she was put on levothyroxine 50 mg p.o. daily for hypothyroidism.  In the interim, her dose was lowered to 25 mcg p.o. daily.  -Complains of progressive weight gain, gained 30+ pounds since last visit.  She denies palpitations, tremors, heat intolerance. - Denies family history of thyroid malignancy.  Denies dysphagia, odynophagia, voice change.  She is a chronic heavy smoker.   -She denies heat/cold intolerance.  She admits to dietary indiscretions.  Review of Systems  Constitutional: + Progressive weight gain, + fatigue,  no subjective hyperthermia, no subjective hypothermia Eyes: no blurry vision, no xerophthalmia ENT: no sore throat, no nodules palpated in throat, no dysphagia/odynophagia, no hoarseness  Gastrointestinal: no Nausea/Vomiting/Diarhhea Musculoskeletal:  + Walks with a cane due to diffuse  arthritis, + muscle/joint aches Skin: no rashes Neurological: no tremors, no numbness, no tingling, no dizziness Psychiatric: no depression, no anxiety  Objective:    BP 118/72   Pulse 72   Ht 5\' 4"  (1.626 m)   Wt 272 lb (123.4 kg)   LMP 05/02/2020   BMI 46.69 kg/m   Wt Readings from Last 3 Encounters:  05/28/20 272 lb (123.4 kg)  05/28/20 270 lb 12.8 oz (122.8 kg)  05/13/20 275 lb (124.7 kg)    Physical Exam    Constitutional:  Body mass index is 46.69 kg/m. , not in acute distress, normal state of mind Eyes:  EOMI, no exophthalmos Neck:  Supple Thyroid: No gross goiter Respiratory: Adequate breathing efforts Musculoskeletal:  + Walks with a cane, no gross deformities, strength intact in all four extremities, no gross restriction of joint movements Skin:  no rashes, no hyperemia Neurological: no tremor with outstretched hands  CMP     Component Value Date/Time   NA 139 04/28/2020 0624   NA 139 03/13/2020 0932   K 4.0 04/28/2020 0624   CL 102 04/28/2020 0624   CO2 25 04/28/2020 0624   GLUCOSE 143 (H) 04/28/2020 0624   BUN 9 04/28/2020 0624   BUN 12 03/13/2020 0932   CREATININE 0.56 04/28/2020 0624   CALCIUM 9.4 04/28/2020 0624   PROT 6.8 04/27/2020 1156   PROT 6.9 03/13/2020 0932   ALBUMIN 4.0 04/27/2020 1156   ALBUMIN 4.5 03/13/2020 0932   AST 30 04/27/2020 1156   ALT 13 04/27/2020 1156   ALKPHOS 51 04/27/2020 1156   BILITOT 1.1 04/27/2020 1156   BILITOT <0.2 03/13/2020 0932   GFRNONAA >60 04/28/2020 0624   GFRAA >60 04/28/2020 0624     Diabetic Labs (most recent): Lab Results  Component Value Date   HGBA1C 5.4 08/28/2019   HGBA1C 5.8 (A) 05/01/2019   HGBA1C 5.1 09/05/2017     Lipid Panel ( most recent) Lipid Panel     Component Value Date/Time   CHOL 157 03/13/2020 0932   TRIG 152 (H) 03/13/2020 0932   HDL 40 03/13/2020 0932   CHOLHDL 3.9 03/13/2020 0932   CHOLHDL 2.9 09/05/2017 0621   VLDL 27 09/05/2017 0621   LDLCALC 90 03/13/2020 0932      Lab Results  Component Value Date   TSH 1.300 03/13/2020   TSH 1.330 02/22/2019   TSH 0.800 05/11/2018   TSH 1.189 03/06/2018   TSH 2.082 09/05/2017   FREET4 1.05 03/13/2020   FREET4 0.93 05/11/2018   FREET4 0.79 (L) 03/06/2018    December 18, 2017 CT head: 13 mm nodule on the right lobe of the thyroid.   March 28, 2018 thyroid ultrasound IMPRESSION: Right thyroid nodule does not meet criteria for biopsy or surveillance, as designated by the newly established ACR TI-RADS criteria.  Assessment & Plan:   1. Nodular goiter 2.   Hypothyroidism  Her previsit thyroid function tests are such that she would benefit from slight increase in her levothyroxine dose.  I discussed and increased her levothyroxine back to 50 mcg p.o. daily before breakfast.     - We discussed about the correct intake of her thyroid hormone, on empty stomach at fasting, with water, separated by at least 30 minutes from breakfast and other medications,  and separated by more than 4 hours from calcium, iron, multivitamins, acid reflux medications (PPIs). -Patient is made aware of the fact that thyroid hormone replacement is needed for life, dose to be adjusted by periodic monitoring of thyroid function tests.   -Based on her ultrasound findings, she would not require fine-needle aspiration at this time.  She may need repeat thyroid ultrasound in 1-2 years.   - I advised her  to maintain close follow up with Azzie Glatter, FNP for primary care needs.     - Time spent on this patient care encounter:  20 minutes of which 50% was spent in  counseling and the rest reviewing  her current and  previous labs / studies and medications  doses and developing a plan for long term care. Deborah Barnes  participated in the discussions, expressed understanding, and  voiced agreement with the above plans.  All questions were answered to her satisfaction. she is encouraged to contact clinic should she have any questions or concerns prior to her return visit.  Follow up plan: Return in about 4 months (around 09/27/2020) for F/U with Pre-visit Labs, NV A1c in Office.   Glade Lloyd, MD [facility] [address] [place], [place] [address] Phone: [phone]  Fax: [phone]     05/28/2020, 1:49 PM  This note was partially dictated with voice recognition software. Similar sounding words can be transcribed inadequately or may not  be corrected upon review.

## 2020-06-12 ENCOUNTER — Encounter: Payer: Self-pay | Admitting: Family Medicine

## 2020-06-15 ENCOUNTER — Ambulatory Visit: Payer: Medicaid Other | Admitting: Obstetrics and Gynecology

## 2020-06-16 ENCOUNTER — Encounter: Payer: Self-pay | Admitting: Orthopaedic Surgery

## 2020-06-16 ENCOUNTER — Ambulatory Visit (INDEPENDENT_AMBULATORY_CARE_PROVIDER_SITE_OTHER): Payer: Medicaid Other | Admitting: Orthopaedic Surgery

## 2020-06-16 VITALS — Ht 65.0 in | Wt 275.0 lb

## 2020-06-16 DIAGNOSIS — Z96641 Presence of right artificial hip joint: Secondary | ICD-10-CM
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
## 2020-06-17 ENCOUNTER — Encounter: Payer: Self-pay | Admitting: Orthopaedic Surgery

## 2020-06-19 ENCOUNTER — Ambulatory Visit: Payer: Medicaid Other | Admitting: Orthopaedic Surgery

## 2020-06-24 MED FILL — ?CITALOPRAM HBR 20 MG TABLE: 20 | 30 days supply | Qty: 30 | Fill #4

## 2020-06-25 ENCOUNTER — Ambulatory Visit: Payer: Medicaid Other | Admitting: Obstetrics and Gynecology

## 2020-07-16 ENCOUNTER — Other Ambulatory Visit: Payer: Self-pay

## 2020-07-16 ENCOUNTER — Encounter: Payer: Self-pay | Admitting: Surgery

## 2020-07-16 ENCOUNTER — Ambulatory Visit: Payer: Self-pay

## 2020-07-16 ENCOUNTER — Ambulatory Visit: Payer: Medicaid Other | Admitting: Surgery

## 2020-07-16 ENCOUNTER — Ambulatory Visit (INDEPENDENT_AMBULATORY_CARE_PROVIDER_SITE_OTHER): Payer: Self-pay | Admitting: Surgery

## 2020-07-16 VITALS — BP 141/87 | HR 68

## 2020-07-16 DIAGNOSIS — Z96641 Presence of right artificial hip joint: Secondary | ICD-10-CM

## 2020-07-16 DIAGNOSIS — M67472 Ganglion, left ankle and foot: Secondary | ICD-10-CM
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
## 2020-07-21 ENCOUNTER — Ambulatory Visit (INDEPENDENT_AMBULATORY_CARE_PROVIDER_SITE_OTHER): Payer: Self-pay | Admitting: Gastroenterology

## 2020-07-21 ENCOUNTER — Encounter: Payer: Self-pay | Admitting: Gastroenterology

## 2020-07-21 ENCOUNTER — Telehealth: Payer: Self-pay | Admitting: *Deleted

## 2020-07-21 ENCOUNTER — Other Ambulatory Visit: Payer: Self-pay

## 2020-07-21 VITALS — BP 142/85 | HR 72 | Temp 97.1°F | Ht 65.0 in | Wt 274.4 lb

## 2020-07-21 DIAGNOSIS — K589 Irritable bowel syndrome without diarrhea: Secondary | ICD-10-CM

## 2020-07-21 DIAGNOSIS — K219 Gastro-esophageal reflux disease without esophagitis: Secondary | ICD-10-CM

## 2020-07-21 DIAGNOSIS — Z8619 Personal history of other infectious and parasitic diseases: Secondary | ICD-10-CM

## 2020-07-21 MED ORDER — OMEPRAZOLE 40 MG PO CPDR
40.0000 mg | DELAYED_RELEASE_CAPSULE | Freq: Every day | ORAL | 3 refills | Status: DC
Start: 2020-07-21 — End: 2021-03-17

## 2020-07-21 MED FILL — CITALOPRAM HBR 20 MG TABLET: 20 | 30 days supply | Qty: 30 | Fill #5

## 2020-07-21 NOTE — Telephone Encounter (Signed)
[place] scheduled for 11/9 at 10:30am, arrival 10:15am, npo midnight.  Called pt and she is aware of appt details. She states she may have to put this off for a while if she can't find transportation

## 2020-07-21 NOTE — Progress Notes (Signed)
Referring Provider: Azzie Glatter, FNP Primary Care Physician:  Azzie Glatter, FNP Primary GI: Dr. Gala Romney   Chief Complaint  Patient presents with  . Gastroesophageal Reflux    doing okay  . Hepatitis C    HPI:   Deborah Barnes is a 44 y.o. female presenting today with a history of chronic Hep Cgenotype 1a, Metavir F3/F4. No evidence of chronic Hep B. First dose of Harvoni on 9/16. Shecompleted Harvoni in Dec 2019. She attained SVR. Will be following with serial [place]. Could consider biopsy to determine fibrosis status and need for surveillance.Colonoscopy due again in 2023 due to history of multiple adenomas. History of IBS and chronic GERD. Here for routine follow-up.   IBS flare recently and provided Xifaxan. 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 No abdominal pain. Bowel habits back to baseline. No constipation or diarrhea. [place] due now.   Omeprazole once daily. Rarely gets choked. Couldn't tell a big difference with dilation. Only happens with liquids.  Working with [employer] and enjoying it.    Past Medical History:  Diagnosis Date  . Asthma   . Back pain   . Bipolar disorder ([place])    DX in the past but not currently be treated  . Bulging disc   . Complication of anesthesia 2014   anesthesia ? caused depression/suicidal ideation per pt.  Marland Kitchen COPD (chronic obstructive pulmonary disease) ([place])   . De Quervain's tenosynovitis, right 10/2019   surgery  . Degenerative disc disease   . Depression   . Depression with anxiety   . DJD (degenerative joint disease)    right hip  . GERD (gastroesophageal reflux disease)   . Hepatitis C 08/2017   TX for HEP C  . Hip pain, right   . History of alcohol abuse   . History of IBS   . History of mixed drug abuse ([place])   . Hypercholesterolemia   . Hypertension   . Hypoglycemia   . Hypothyroidism   . PTSD (post-traumatic stress disorder)   . Sciatica   . Shortness of breath   . Thyroid disease   . Vitamin D deficiency  02/2020  . Walker as ambulation aid     Past Surgical History:  Procedure Laterality Date  . BIOPSY N/A 09/25/2014   Procedure: BIOPSY;  Surgeon: Daneil Dolin, MD;  [facility];  Service: Endoscopy;  Laterality: N/A;  Gastric, Ascending Colon, Descending/Sigmoid Colon   . CARPAL TUNNEL RELEASE Bilateral   . CHOLECYSTECTOMY N/A 04/26/2013   Procedure: LAPAROSCOPIC CHOLECYSTECTOMY;  Surgeon: Jamesetta So, MD;  [facility];  Service: General;  Laterality: N/A;  . COLONOSCOPY WITH PROPOFOL N/A 09/25/2014   RMR: Pancolonic diverticulosis. multiple tubular adenomas, segmental biopsies negative. surveillance in 5 years  . COLONOSCOPY WITH PROPOFOL N/A 01/16/2020   diverticulosis, two 4-6 mm polyps in sigmoid and splenic flexure, one 10 mm polyp in sigmoid Tubular adenomas, 3 year surveillance.  . DORSAL COMPARTMENT RELEASE Right 10/30/2019   Procedure: RIGHT WRIST 1ST  DORSAL COMPARTMENT RELEASE (DEQUERVAIN);  Surgeon: Marybelle Killings, MD;  Location: [facility];  Service: Orthopedics;  Laterality: Right;  . ESOPHAGOGASTRODUODENOSCOPY (EGD) WITH PROPOFOL N/A 09/25/2014   RMR: Normal esophagus. Abnormal gastric mucosa status post biopsy (negative H.pylori). Hiatal hernia.   Marland Kitchen ESOPHAGOGASTRODUODENOSCOPY (EGD) WITH PROPOFOL N/A 01/16/2020   Normal esophagus s/p dilation, normal stomach and duodenal bulb.   Fatima Blank HERNIA REPAIR N/A 10/04/2013   Procedure: HERNIA REPAIR INCISIONAL WITH MESH;  Surgeon: Jamesetta So, MD;  [facility];  Service: General;  Laterality: N/A;  . INSERTION OF MESH N/A 10/04/2013   Procedure: INSERTION OF MESH;  Surgeon: Jamesetta So, MD;  [facility];  Service: General;  Laterality: N/A;  Venia Minks DILATION N/A 01/16/2020   Procedure: Keturah Shavers;  Surgeon: Daneil Dolin, MD;  [facility];  Service: Endoscopy;  Laterality: N/A;  . POLYPECTOMY N/A 09/25/2014   Procedure: POLYPECTOMY;  Surgeon: Daneil Dolin, MD;  [facility];  Service:  Endoscopy;  Laterality: N/A;  Cecal, Ascending Colon   . POLYPECTOMY  01/16/2020   Procedure: POLYPECTOMY;  Surgeon: Daneil Dolin, MD;  [facility];  Service: Endoscopy;;  . SHOULDER SURGERY Left   . TOOTH EXTRACTION  08/09/2019  . TOTAL HIP ARTHROPLASTY Right 04/27/2020   Procedure: TOTAL HIP ARTHROPLASTY DIRECT ANTERIOR;  Surgeon: Marybelle Killings, MD;  Location: [facility];  Service: Orthopedics;  Laterality: Right;  . TUBAL LIGATION    . WISDOM TOOTH EXTRACTION      Current Outpatient Medications  Medication Sig Dispense Refill  . acetaminophen (TYLENOL) 500 MG tablet Take 500 mg by mouth every 6 (six) hours as needed.    . citalopram (CELEXA) 20 MG tablet TAKE 1 TABLET (20 MG TOTAL) BY MOUTH DAILY. 30 tablet 6  . ibuprofen (ADVIL,MOTRIN) 200 MG tablet Take 800 mg by mouth every 8 (eight) hours as needed for fever or moderate pain.     Marland Kitchen levothyroxine (SYNTHROID) 50 MCG tablet Take 1 tablet (50 mcg total) by mouth daily before breakfast. 90 tablet 1  . loratadine (CLARITIN) 10 MG tablet TAKE 1 Tablet BY MOUTH ONCE DAILY (Patient taking differently: Take 10 mg by mouth daily. ) 90 tablet 2  . Melatonin 10 MG TABS Take 40 mg by mouth at bedtime.     . metoprolol tartrate (LOPRESSOR) 50 MG tablet TAKE 1 Tablet  BY MOUTH TWICE DAILY (Patient taking differently: Take 50 mg by mouth 2 (two) times daily. ) 180 tablet 2  . omeprazole (PRILOSEC) 40 MG capsule TAKE 1 Capsule BY MOUTH ONCE DAILY (Patient taking differently: Take 40 mg by mouth daily. ) 90 capsule 3  . OVER THE COUNTER MEDICATION Take 2 capsules by mouth 2 (two) times daily as needed (pain). Hemp Capsules    . PROVENTIL HFA 108 (90 Base) MCG/ACT inhaler INHALE 1 TO 2 PUFFS BY MOUTH EVERY 6 HOURS AS NEEDED FOR COUGHING, WHEEZING, OR SHORTNESS OF BREATH (Patient taking differently: Inhale 1-2 puffs into the lungs every 6 (six) hours as needed for wheezing or shortness of breath. ) 20.1 g 1  . simethicone (MYLICON) 353 MG chewable  tablet Chew 125 mg by mouth every 6 (six) hours as needed for flatulence.     No current facility-administered medications for this visit.    Allergies as of 07/21/2020 - Review Complete 07/21/2020  Allergen Reaction Noted  . Clarithromycin Diarrhea 02/22/2012  . Propoxyphene Itching 01/25/2018    Family History  Problem Relation Age of Onset  . Diabetes Father   . Colon cancer Maternal Grandmother   . Heart disease Paternal Grandmother   . Cancer Paternal Grandmother        esophageal  . Hypertension Paternal Grandmother   . GER disease Paternal Grandmother   . Osteoporosis Paternal Grandmother     Social History   Socioeconomic History  . Marital status: Divorced    Spouse name: Not on file  .  Number of children: Not on file  . Years of education: Not on file  . Highest education level: Not on file  Occupational History  . Not on file  Tobacco Use  . Smoking status: Former Smoker    Packs/day: 0.50    Years: 27.00    Pack years: 13.50    Quit date: 08/24/2018    Years since quitting: 1.9  . Smokeless tobacco: Never Used  . Tobacco comment: vape former user  Vaping Use  . Vaping Use: Former  . Quit date: 08/24/2018  Substance and Sexual Activity  . Alcohol use: Not Currently    Alcohol/week: 1.0 standard drink    Types: 1 Cans of beer per week    Comment: hx binge drinker. none since 01/23/18  . Drug use: Not Currently    Types: Marijuana, Cocaine, Methamphetamines, Heroin    Comment: no cocaine, heroin, meth since 01/23/18. last marijuana use 01-23-18  . Sexual activity: Not Currently    Birth control/protection: Surgical    Comment: tubal ligation  Other Topics Concern  . Not on file  Social History Narrative  . Not on file   Social Determinants of Health   Financial Resource Strain:   . Difficulty of Paying Living Expenses: Not on file  Food Insecurity:   . Worried About Charity fundraiser in the Last Year: Not on file  . Ran Out of Food in the Last  Year: Not on file  Transportation Needs:   . Lack of Transportation (Medical): Not on file  . Lack of Transportation (Non-Medical): Not on file  Physical Activity:   . Days of Exercise per Week: Not on file  . Minutes of Exercise per Session: Not on file  Stress:   . Feeling of Stress : Not on file  Social Connections:   . Frequency of Communication with Friends and Family: Not on file  . Frequency of Social Gatherings with Friends and Family: Not on file  . Attends Religious Services: Not on file  . Active Member of Clubs or Organizations: Not on file  . Attends Archivist Meetings: Not on file  . Marital Status: Not on file    Review of Systems: Gen: Denies fever, chills, anorexia. Denies fatigue, weakness, weight loss.  CV: Denies chest pain, palpitations, syncope, peripheral edema, and claudication. Resp: Denies dyspnea at rest, cough, wheezing, coughing up blood, and pleurisy. GI: see HPI Derm: Denies rash, itching, dry skin Psych: Denies depression, anxiety, memory loss, confusion. No homicidal or suicidal ideation.  Heme: Denies bruising, bleeding, and enlarged lymph nodes.  Physical Exam: BP (!) 142/85   Pulse 72   Temp (!) 97.1 F (36.2 C)   Ht 5\' 5"  (1.651 m)   Wt 274 lb 6.4 oz (124.5 kg)   LMP 07/04/2020   BMI 45.66 kg/m  General:   Alert and oriented. No distress noted. Pleasant and cooperative.  Head:  Normocephalic and atraumatic. Eyes:  Conjuctiva clear without scleral icterus. Mouth:  Mask in place Abdomen:  +BS, soft, non-tender and non-distended. No rebound or guarding. No HSM or masses noted. Msk:  Symmetrical without gross deformities. Normal posture. Extremities:  Without edema. Neurologic:  Alert and  oriented x4 Psych:  Alert and cooperative. Normal mood and affect.  ASSESSMENT: Deborah Barnes is a 45 y.o. female presenting today with history of Hep C s/p documented eradication, elevated fibrosis score but without cirrhosis and  following closely with yearly ultrasounds, chronic GERD, and IBS.  Doing well  with GERD controlled on omeprazole daily. No concerns with IBS currently. I do note anemia following hip surgery as expected, so we will recheck CBC now to ensure returned to baseline.    PLAN:  CBC  Continue omeprazole, refills provided  RUQ [place]. Recommend yearly  Return in 6-8 months  Annitta Needs, PhD, [facility]

## 2020-07-21 NOTE — Patient Instructions (Addendum)
Please have blood count done when you are able; I am just making sure you have returned to baseline after surgery.  I refilled omeprazole for you.  We can pursue routine ultrasound of your liver now. I recommend doing yearly for now.   So good to see you! I will see you in 6-8 months!  I enjoyed seeing you again today! As you know, I value our relationship and want to provide genuine, compassionate, and quality care. I welcome your feedback. If you receive a survey regarding your visit,  I greatly appreciate you taking time to fill this out. See you next time!  Annitta Needs, PhD, ANP-BC [facility]

## 2020-07-23 ENCOUNTER — Ambulatory Visit: Payer: Medicaid Other | Admitting: Obstetrics and Gynecology

## 2020-07-28 ENCOUNTER — Ambulatory Visit (HOSPITAL_COMMUNITY): Payer: Medicaid Other

## 2020-07-30 ENCOUNTER — Other Ambulatory Visit: Payer: Self-pay

## 2020-07-30 ENCOUNTER — Ambulatory Visit (INDEPENDENT_AMBULATORY_CARE_PROVIDER_SITE_OTHER): Payer: Medicaid Other | Admitting: Obstetrics and Gynecology

## 2020-07-30 ENCOUNTER — Encounter: Payer: Self-pay | Admitting: Obstetrics and Gynecology

## 2020-07-30 ENCOUNTER — Other Ambulatory Visit (HOSPITAL_COMMUNITY)
Admission: RE | Admit: 2020-07-30 | Discharge: 2020-07-30 | Disposition: A | Discharge status: Home / Self Care | Payer: Medicaid Other | Source: Ambulatory Visit | Attending: Obstetrics and Gynecology | Admitting: Obstetrics and Gynecology

## 2020-07-30 VITALS — BP 149/95 | HR 71 | Wt 272.2 lb

## 2020-07-30 DIAGNOSIS — F419 Anxiety disorder, unspecified: Secondary | ICD-10-CM | POA: Diagnosis not present

## 2020-07-30 DIAGNOSIS — Z01419 Encounter for gynecological examination (general) (routine) without abnormal findings: Secondary | ICD-10-CM

## 2020-07-30 DIAGNOSIS — F39 Unspecified mood [affective] disorder: Secondary | ICD-10-CM

## 2020-07-30 NOTE — Progress Notes (Signed)
Hormone issue she wants to discuss today. Very anxious about visit today.
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
## 2020-07-31 LAB — CYTOLOGY - PAP
Adequacy: ABSENT
Comment: NEGATIVE
Diagnosis: NEGATIVE
High risk HPV: NEGATIVE

## 2020-07-31 MED ORDER — CITALOPRAM HYDROBROMIDE 20 MG PO TABS: ORAL_TABLET | ORAL | 6 refills | Status: DC

## 2020-08-04 ENCOUNTER — Ambulatory Visit: Payer: Self-pay

## 2020-08-04 ENCOUNTER — Ambulatory Visit (INDEPENDENT_AMBULATORY_CARE_PROVIDER_SITE_OTHER): Payer: Self-pay | Admitting: Orthopedic Surgery

## 2020-08-04 DIAGNOSIS — M6702 Short Achilles tendon (acquired), left ankle: Secondary | ICD-10-CM

## 2020-08-04 DIAGNOSIS — M79672 Pain in left foot: Secondary | ICD-10-CM

## 2020-08-06 ENCOUNTER — Encounter: Payer: Self-pay | Admitting: Orthopedic Surgery

## 2020-08-06 ENCOUNTER — Ambulatory Visit: Payer: Medicaid Other | Admitting: Rehabilitative and Restorative Service Providers"
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
## 2020-08-17 ENCOUNTER — Encounter: Payer: Self-pay | Admitting: Orthopedic Surgery

## 2020-08-17 ENCOUNTER — Encounter: Payer: Self-pay | Admitting: Family Medicine

## 2020-09-01 ENCOUNTER — Ambulatory Visit: Payer: Medicaid Other | Admitting: Orthopedic Surgery

## 2020-09-01 ENCOUNTER — Encounter: Payer: Self-pay | Admitting: Orthopedic Surgery

## 2020-09-01 ENCOUNTER — Encounter: Payer: Self-pay | Admitting: Orthopaedic Surgery

## 2020-09-08 ENCOUNTER — Other Ambulatory Visit: Payer: Self-pay | Admitting: Family Medicine

## 2020-09-08 ENCOUNTER — Ambulatory Visit (INDEPENDENT_AMBULATORY_CARE_PROVIDER_SITE_OTHER): Payer: Self-pay | Admitting: Family Medicine

## 2020-09-08 ENCOUNTER — Other Ambulatory Visit: Payer: Self-pay

## 2020-09-08 ENCOUNTER — Encounter: Payer: Self-pay | Admitting: Family Medicine

## 2020-09-08 ENCOUNTER — Ambulatory Visit: Payer: Self-pay | Admitting: Family Medicine

## 2020-09-08 VITALS — BP 141/84 | HR 74 | Temp 97.3°F | Ht 65.0 in | Wt 276.2 lb

## 2020-09-08 DIAGNOSIS — M654 Radial styloid tenosynovitis [de Quervain]: Secondary | ICD-10-CM

## 2020-09-08 DIAGNOSIS — Z96641 Presence of right artificial hip joint: Secondary | ICD-10-CM

## 2020-09-08 DIAGNOSIS — Z6841 Body Mass Index (BMI) 40.0 and over, adult: Secondary | ICD-10-CM

## 2020-09-08 DIAGNOSIS — H547 Unspecified visual loss: Secondary | ICD-10-CM

## 2020-09-08 DIAGNOSIS — E66813 Obesity, class 3: Secondary | ICD-10-CM

## 2020-09-08 DIAGNOSIS — F419 Anxiety disorder, unspecified: Secondary | ICD-10-CM

## 2020-09-08 DIAGNOSIS — E039 Hypothyroidism, unspecified: Secondary | ICD-10-CM

## 2020-09-08 DIAGNOSIS — Z09 Encounter for follow-up examination after completed treatment for conditions other than malignant neoplasm: Secondary | ICD-10-CM

## 2020-09-08 DIAGNOSIS — F314 Bipolar disorder, current episode depressed, severe, without psychotic features: Secondary | ICD-10-CM

## 2020-09-08 DIAGNOSIS — Z8719 Personal history of other diseases of the digestive system: Secondary | ICD-10-CM

## 2020-09-08 DIAGNOSIS — R739 Hyperglycemia, unspecified: Secondary | ICD-10-CM

## 2020-09-08 DIAGNOSIS — R7303 Prediabetes: Secondary | ICD-10-CM

## 2020-09-08 MED ORDER — METFORMIN HCL ER 750 MG PO TB24: 750.0000 mg | ORAL_TABLET | Freq: Every day | ORAL | 1 refills | Status: DC

## 2020-09-08 MED ORDER — BLOOD GLUCOSE METER KIT: PACK | 0 refills | Status: AC

## 2020-09-08 MED ORDER — KETOROLAC TROMETHAMINE 60 MG/2ML IM SOLN: 60.0000 mg | Freq: Once | INTRAMUSCULAR | Status: AC

## 2020-09-08 MED ORDER — KETOROLAC TROMETHAMINE 60 MG/2ML IM SOLN
60.0000 mg | Freq: Once | INTRAMUSCULAR | Status: AC
  Administered 2020-09-08: 16:00:00 60 mg via INTRAMUSCULAR

## 2020-09-08 MED FILL — METFORMIN HCL ER 750 MG TAB: 750 | 30 days supply | Qty: 30 | Fill #0
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
## 2020-09-08 NOTE — Progress Notes (Signed)
[facility] Case Management Referral Note  09/08/2020 Name: Deborah Barnes MRN: [id number] DOB: 21-Jun-1976 Deborah Barnes is a 44 y.o. year old female who sees Azzie Glatter, FNP for primary care. LCSW was consulted to assess patient's needs and assist the patient with Transportation Needs .  Interpreter: No.   Interpreter Name & Language: none  Assessment: Patient experiencing Transportation barriers. She reports she recently had to cancel several doctors appointments due to lack of transportation. She needs to see several specialists.   Intervention: CSW registered patient for [facility] transportation services. Advised patient on transportation department contact information so she can schedule rides once she has rescheduled her appointments. Patient has a ride home for today.  Review of patient status, including review of consultants reports, relevant laboratory and other test results, and collaboration with appropriate care team members and the patient's provider was performed as part of comprehensive patient evaluation and provision of services.    SDOH (Social Determinants of Health) assessments performed: No    Outpatient Encounter Medications as of 09/08/2020  Medication Sig Note  . acetaminophen (TYLENOL) 500 MG tablet Take 500 mg by mouth every 6 (six) hours as needed. 09/08/2020: As needed.   . citalopram (CELEXA) 20 MG tablet 30 mg daily then increase to 35 mg 14 days before anticipated onset of menstruation and continued to the onset of menses then go back to 30 mg daily.   Marland Kitchen ibuprofen (ADVIL,MOTRIN) 200 MG tablet Take 800 mg by mouth every 8 (eight) hours as needed for fever or moderate pain.  09/08/2020: As needed   . levothyroxine (SYNTHROID) 50 MCG tablet Take 1 tablet (50 mcg total) by mouth daily before breakfast.   . loratadine (CLARITIN) 10 MG tablet TAKE 1 Tablet BY MOUTH ONCE DAILY (Patient taking differently: Take 10 mg by mouth daily.) 09/08/2020: As  needed.   . Melatonin 10 MG TABS Take 40 mg by mouth at bedtime.  09/08/2020: As needed.   . metoprolol tartrate (LOPRESSOR) 50 MG tablet TAKE 1 Tablet  BY MOUTH TWICE DAILY (Patient taking differently: Take 50 mg by mouth 2 (two) times daily.)   . omeprazole (PRILOSEC) 40 MG capsule Take 1 capsule (40 mg total) by mouth daily.   Marland Kitchen OVER THE COUNTER MEDICATION Take 2 capsules by mouth 2 (two) times daily as needed (pain). Hemp Capsules 09/08/2020: Occasional   . PROVENTIL HFA 108 (90 Base) MCG/ACT inhaler INHALE 1 TO 2 PUFFS BY MOUTH EVERY 6 HOURS AS NEEDED FOR COUGHING, WHEEZING, OR SHORTNESS OF BREATH (Patient taking differently: Inhale 1-2 puffs into the lungs every 6 (six) hours as needed for wheezing or shortness of breath.)   . simethicone (MYLICON) 607 MG chewable tablet Chew 125 mg by mouth every 6 (six) hours as needed for flatulence. 09/08/2020: As needed.   . blood glucose meter kit and supplies Dispense based on patient and insurance preference. Use up to four times daily as directed. (FOR ICD-10 E10.9, E11.9).   . metFORMIN (GLUCOPHAGE-XR) 750 MG 24 hr tablet Take 1 tablet (750 mg total) by mouth daily with breakfast.   . [DISCONTINUED] metFORMIN (GLUCOPHAGE-XR) 750 MG 24 hr tablet Take 1 tablet (750 mg total) by mouth daily with breakfast.    Facility-Administered Encounter Medications as of 09/08/2020  Medication  . ketorolac (TORADOL) injection 60 mg    Goals Addressed   None      Follow up Plan: 1. CSW available from clinic as needed  Estanislado Emms, [facility] [phone]

## 2020-09-08 NOTE — Patient Instructions (Signed)
Metformin extended-release tablets What is this medicine? METFORMIN (met FOR min) is used to treat type 2 diabetes. It helps to control blood sugar. Treatment is combined with diet and exercise. This medicine can be used alone or with other medicines for diabetes. This medicine may be used for other purposes; ask your health care provider or pharmacist if you have questions. COMMON BRAND NAME(S): Fortamet, Glucophage XR, Glumetza What should I tell my health care provider before I take this medicine? They need to know if you have any of these conditions:  anemia  dehydration  heart disease  frequently drink alcohol-containing beverages  kidney disease  liver disease  polycystic ovary syndrome  serious infection or injury  vomiting  an unusual or allergic reaction to metformin, other medicines, foods, dyes, or preservatives  pregnant or trying to get pregnant  breast-feeding How should I use this medicine? Take this medicine by mouth with a glass of water. Follow the directions on the prescription label. Take this medicine with food. Take your medicine at regular intervals. Do not take your medicine more often than directed. Do not stop taking except on your doctor's advice. Talk to your pediatrician regarding the use of this medicine in children. Special care may be needed. Overdosage: If you think you have taken too much of this medicine contact a poison control center or emergency room at once. NOTE: This medicine is only for you. Do not share this medicine with others. What if I miss a dose? If you miss a dose, take it as soon as you can. If it is almost time for your next dose, take only that dose. Do not take double or extra doses. What may interact with this medicine? Do not take this medicine with any of the following medications:  certain contrast medicines given before X-rays, CT scans, MRI, or other procedures  dofetilide This medicine may also interact with the  following medications:  acetazolamide  alcohol  certain antivirals for HIV or hepatitis  certain medicines for blood pressure, heart disease, irregular heart beat  cimetidine  dichlorphenamide  digoxin  diuretics  female hormones, like estrogens or progestins and birth control pills  glycopyrrolate  isoniazid  lamotrigine  memantine  methazolamide  metoclopramide  midodrine  niacin  phenothiazines like chlorpromazine, mesoridazine, prochlorperazine, thioridazine  phenytoin  ranolazine  steroid medicines like prednisone or cortisone  stimulant medicines for attention disorders, weight loss, or to stay awake  thyroid medicines  topiramate  trospium  vandetanib  zonisamide This list may not describe all possible interactions. Give your health care provider a list of all the medicines, herbs, non-prescription drugs, or dietary supplements you use. Also tell them if you smoke, drink alcohol, or use illegal drugs. Some items may interact with your medicine. What should I watch for while using this medicine? Visit your doctor or health care professional for regular checks on your progress. A test called the HbA1C (A1C) will be monitored. This is a simple blood test. It measures your blood sugar control over the last 2 to 3 months. You will receive this test every 3 to 6 months. Learn how to check your blood sugar. Learn the symptoms of low and high blood sugar and how to manage them. Always carry a quick-source of sugar with you in case you have symptoms of low blood sugar. Examples include hard sugar candy or glucose tablets. Make sure others know that you can choke if you eat or drink when you develop serious symptoms of low   blood sugar, such as seizures or unconsciousness. They must get medical help at once. Tell your doctor or health care professional if you have high blood sugar. You might need to change the dose of your medicine. If you are sick or  exercising more than usual, you might need to change the dose of your medicine. Do not skip meals. Ask your doctor or health care professional if you should avoid alcohol. Many nonprescription cough and cold products contain sugar or alcohol. These can affect blood sugar. This medicine may cause ovulation in premenopausal women who do not have regular monthly periods. This may increase your chances of becoming pregnant. You should not take this medicine if you become pregnant or think you may be pregnant. Talk with your doctor or health care professional about your birth control options while taking this medicine. Contact your doctor or health care professional right away if you think you are pregnant. The tablet shell for some brands of this medicine does not dissolve. This is normal. The tablet shell may appear whole in the stool. This is not a cause for concern. If you are going to need surgery, a MRI, CT scan, or other procedure, tell your doctor that you are taking this medicine. You may need to stop taking this medicine before the procedure. Wear a medical ID bracelet or chain, and carry a card that describes your disease and details of your medicine and dosage times. This medicine may cause a decrease in folic acid and vitamin B12. You should make sure that you get enough vitamins while you are taking this medicine. Discuss the foods you eat and the vitamins you take with your health care professional. What side effects may I notice from receiving this medicine? Side effects that you should report to your doctor or health care professional as soon as possible:  allergic reactions like skin rash, itching or hives, swelling of the face, lips, or tongue  breathing problems  feeling faint or lightheaded, falls  muscle aches or pains  signs and symptoms of low blood sugar such as feeling anxious, confusion, dizziness, increased hunger, unusually weak or tired, sweating, shakiness, cold,  irritable, headache, blurred vision, fast heartbeat, loss of consciousness  slow or irregular heartbeat  unusual stomach pain or discomfort  unusually tired or weak Side effects that usually do not require medical attention (report to your doctor or health care professional if they continue or are bothersome):  diarrhea  headache  heartburn  metallic taste in mouth  nausea  stomach gas, upset This list may not describe all possible side effects. Call your doctor for medical advice about side effects. You may report side effects to FDA at 1-800-FDA-1088. Where should I keep my medicine? Keep out of the reach of children. Store at room temperature between 15 and 30 degrees C (59 and 86 degrees F). Protect from light. Throw away any unused medicine after the expiration date. NOTE: This sheet is a summary. It may not cover all possible information. If you have questions about this medicine, talk to your doctor, pharmacist, or health care provider.  2020 Elsevier/Gold Standard (2017-10-12 18:58:32)

## 2020-09-09 MED FILL — TRUE METRIX BLOOD GLUCOSE M: W/DEVICE | 1 days supply | Qty: 1 | Fill #0

## 2020-09-09 MED FILL — TRUEplus LANCETS 28G MISC: 25 days supply | Qty: 100 | Fill #0

## 2020-09-09 MED FILL — TRUE METRIX GLUCOSE TEST ST: 25 days supply | Qty: 100 | Fill #0

## 2020-09-15 ENCOUNTER — Encounter: Payer: Self-pay | Admitting: Family Medicine

## 2020-09-15 ENCOUNTER — Ambulatory Visit: Payer: Self-pay | Admitting: Physical Therapy

## 2020-09-23 ENCOUNTER — Ambulatory Visit: Payer: Self-pay | Admitting: Rehabilitative and Restorative Service Providers"

## 2020-09-28 ENCOUNTER — Encounter: Payer: Self-pay | Admitting: Family Medicine

## 2020-09-29 ENCOUNTER — Ambulatory Visit: Payer: Self-pay | Admitting: Physical Therapy

## 2020-09-29 ENCOUNTER — Ambulatory Visit: Payer: Medicaid Other | Admitting: Orthopaedic Surgery

## 2020-09-29 ENCOUNTER — Ambulatory Visit: Payer: Medicaid Other | Admitting: "Endocrinology

## 2020-09-30 ENCOUNTER — Ambulatory Visit: Payer: Self-pay | Admitting: Rehabilitative and Restorative Service Providers"

## 2020-10-02 ENCOUNTER — Ambulatory Visit: Payer: Medicaid Other | Admitting: Orthopaedic Surgery

## 2020-10-02 ENCOUNTER — Other Ambulatory Visit: Payer: Self-pay | Admitting: Family Medicine

## 2020-10-02 DIAGNOSIS — B349 Viral infection, unspecified: Secondary | ICD-10-CM

## 2020-10-02 MED ORDER — DOXYCYCLINE HYCLATE 100 MG PO TABS: 100.0000 mg | ORAL_TABLET | Freq: Two times a day (BID) | ORAL | 0 refills | Status: AC

## 2020-10-02 MED FILL — ?DOXYCYCLINE HYCLATE 100 MG: 100 | 7 days supply | Qty: 14 | Fill #0

## 2020-10-06 ENCOUNTER — Ambulatory Visit (INDEPENDENT_AMBULATORY_CARE_PROVIDER_SITE_OTHER): Payer: Self-pay | Admitting: Orthopaedic Surgery

## 2020-10-06 VITALS — BP 134/73 | HR 75 | Ht 65.0 in | Wt 276.0 lb

## 2020-10-06 DIAGNOSIS — Z96641 Presence of right artificial hip joint: Secondary | ICD-10-CM
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
## 2020-10-07 ENCOUNTER — Ambulatory Visit: Payer: Self-pay | Admitting: Rehabilitative and Restorative Service Providers"

## 2020-10-08 ENCOUNTER — Encounter: Payer: Self-pay | Admitting: Family Medicine

## 2020-10-08 ENCOUNTER — Other Ambulatory Visit: Payer: Self-pay

## 2020-10-08 ENCOUNTER — Ambulatory Visit: Payer: Self-pay | Admitting: Physical Therapy

## 2020-10-08 DIAGNOSIS — Z6841 Body Mass Index (BMI) 40.0 and over, adult: Secondary | ICD-10-CM

## 2020-10-08 DIAGNOSIS — R7303 Prediabetes: Secondary | ICD-10-CM

## 2020-10-08 DIAGNOSIS — R739 Hyperglycemia, unspecified: Secondary | ICD-10-CM

## 2020-10-08 MED ORDER — METFORMIN HCL ER 750 MG PO TB24: 750.0000 mg | ORAL_TABLET | Freq: Every day | ORAL | 1 refills | Status: DC

## 2020-10-13 ENCOUNTER — Ambulatory Visit: Payer: Medicaid Other | Admitting: Orthopedic Surgery

## 2020-10-29 ENCOUNTER — Other Ambulatory Visit: Payer: Self-pay | Admitting: Dentistry

## 2020-10-29 MED FILL — AMOXICILLIN 500 MG CAPSULE: 500 | 10 days supply | Qty: 30 | Fill #0

## 2020-11-04 ENCOUNTER — Telehealth: Payer: Self-pay | Admitting: Orthopaedic Surgery

## 2020-11-04 NOTE — Telephone Encounter (Signed)
04/27/2020 OP note faxed to [facility] , [place]

## 2020-11-10 ENCOUNTER — Encounter: Payer: Self-pay | Admitting: Family Medicine

## 2020-11-10 ENCOUNTER — Other Ambulatory Visit: Payer: Self-pay

## 2020-11-10 ENCOUNTER — Ambulatory Visit (INDEPENDENT_AMBULATORY_CARE_PROVIDER_SITE_OTHER): Payer: Self-pay | Admitting: Family Medicine

## 2020-11-10 VITALS — BP 137/78 | HR 68 | Temp 97.2°F | Wt 276.0 lb

## 2020-11-10 DIAGNOSIS — F419 Anxiety disorder, unspecified: Secondary | ICD-10-CM

## 2020-11-10 DIAGNOSIS — R4589 Other symptoms and signs involving emotional state: Secondary | ICD-10-CM

## 2020-11-10 DIAGNOSIS — Z09 Encounter for follow-up examination after completed treatment for conditions other than malignant neoplasm: Secondary | ICD-10-CM

## 2020-11-10 DIAGNOSIS — Z96641 Presence of right artificial hip joint: Secondary | ICD-10-CM

## 2020-11-10 DIAGNOSIS — F314 Bipolar disorder, current episode depressed, severe, without psychotic features: Secondary | ICD-10-CM

## 2020-11-10 DIAGNOSIS — E66813 Obesity, class 3: Secondary | ICD-10-CM

## 2020-11-10 DIAGNOSIS — Z6841 Body Mass Index (BMI) 40.0 and over, adult: Secondary | ICD-10-CM
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
## 2020-11-26 ENCOUNTER — Ambulatory Visit: Payer: Medicaid Other

## 2020-12-01 ENCOUNTER — Telehealth: Payer: Self-pay

## 2020-12-01 NOTE — Telephone Encounter (Signed)
Patient left message, requesting information regarding Mammography Scholarship fund. Call returned to patient. Patient she is not having any problems with her breasts, due for mammogram, was told per [facility] [employer] can apply for the scholarship. Patient informed we can mail the directly to her, or she can download from [employer]. Patient to download scholarship application, call as needed.

## 2020-12-04 ENCOUNTER — Other Ambulatory Visit: Payer: Self-pay | Admitting: Family Medicine

## 2020-12-04 ENCOUNTER — Other Ambulatory Visit: Payer: Self-pay | Admitting: "Endocrinology

## 2020-12-04 DIAGNOSIS — J309 Allergic rhinitis, unspecified: Secondary | ICD-10-CM

## 2020-12-04 DIAGNOSIS — E039 Hypothyroidism, unspecified: Secondary | ICD-10-CM

## 2020-12-04 DIAGNOSIS — I10 Essential (primary) hypertension: Secondary | ICD-10-CM

## 2020-12-07 ENCOUNTER — Encounter: Payer: Self-pay | Admitting: Family Medicine

## 2020-12-08 ENCOUNTER — Other Ambulatory Visit: Payer: Self-pay | Admitting: Family Medicine

## 2020-12-08 DIAGNOSIS — F314 Bipolar disorder, current episode depressed, severe, without psychotic features: Secondary | ICD-10-CM

## 2020-12-08 DIAGNOSIS — F419 Anxiety disorder, unspecified: Secondary | ICD-10-CM

## 2021-01-04 ENCOUNTER — Other Ambulatory Visit: Payer: Self-pay | Admitting: "Endocrinology

## 2021-01-04 DIAGNOSIS — E039 Hypothyroidism, unspecified: Secondary | ICD-10-CM

## 2021-01-13 DIAGNOSIS — Z1231 Encounter for screening mammogram for malignant neoplasm of breast: Secondary | ICD-10-CM

## 2021-02-03 ENCOUNTER — Other Ambulatory Visit: Payer: Self-pay

## 2021-02-03 ENCOUNTER — Ambulatory Visit: Payer: Self-pay | Attending: Family Medicine

## 2021-02-11 ENCOUNTER — Ambulatory Visit (HOSPITAL_COMMUNITY): Payer: Self-pay | Admitting: Licensed Clinical Social Worker

## 2021-02-16 ENCOUNTER — Other Ambulatory Visit: Payer: Self-pay

## 2021-02-16 MED ORDER — OMEPRAZOLE 40 MG PO CPDR
DELAYED_RELEASE_CAPSULE | ORAL | 0 refills | Status: DC
  Filled 2021-02-16: qty 30, 30d supply, fill #0
  Filled 2021-03-17: qty 30, 30d supply, fill #1
  Filled 2021-04-14: qty 30, 30d supply, fill #2
  Filled 2021-05-18: qty 30, 30d supply, fill #3
  Filled 2021-06-17: qty 30, 30d supply, fill #4

## 2021-02-17 ENCOUNTER — Other Ambulatory Visit: Payer: Self-pay

## 2021-02-17 ENCOUNTER — Other Ambulatory Visit: Payer: Self-pay | Admitting: Nurse Practitioner

## 2021-02-17 DIAGNOSIS — F419 Anxiety disorder, unspecified: Secondary | ICD-10-CM

## 2021-02-17 DIAGNOSIS — I10 Essential (primary) hypertension: Secondary | ICD-10-CM

## 2021-02-17 DIAGNOSIS — F39 Unspecified mood [affective] disorder: Secondary | ICD-10-CM

## 2021-02-17 DIAGNOSIS — Z6841 Body Mass Index (BMI) 40.0 and over, adult: Secondary | ICD-10-CM

## 2021-02-17 DIAGNOSIS — R7303 Prediabetes: Secondary | ICD-10-CM

## 2021-02-17 DIAGNOSIS — R739 Hyperglycemia, unspecified: Secondary | ICD-10-CM

## 2021-02-17 MED ORDER — METFORMIN HCL ER 750 MG PO TB24
750.0000 mg | ORAL_TABLET | Freq: Every day | ORAL | 1 refills | Status: DC
  Filled 2021-02-17: qty 750, fill #0

## 2021-02-17 MED ORDER — METOPROLOL TARTRATE 50 MG PO TABS
50.0000 mg | ORAL_TABLET | Freq: Two times a day (BID) | ORAL | 2 refills | Status: DC
  Filled 2021-02-17: qty 60, 30d supply, fill #0
  Filled 2021-03-17: qty 60, 30d supply, fill #1
  Filled 2021-04-14: qty 60, 30d supply, fill #2
  Filled 2021-05-18: qty 60, 30d supply, fill #3
  Filled 2021-06-17: qty 60, 30d supply, fill #4
  Filled 2021-07-22: qty 60, 30d supply, fill #5
  Filled 2021-08-18: qty 60, 30d supply, fill #6
  Filled 2021-09-16: qty 60, 30d supply, fill #7
  Filled 2021-10-24: qty 60, 30d supply, fill #8
  Filled 2021-10-25: qty 60, 30d supply, fill #0

## 2021-02-17 MED ORDER — CITALOPRAM HYDROBROMIDE 20 MG PO TABS
ORAL_TABLET | ORAL | 6 refills | Status: DC
  Filled 2021-02-17: qty 40, fill #0

## 2021-02-22 ENCOUNTER — Other Ambulatory Visit: Payer: Self-pay

## 2021-02-22 ENCOUNTER — Telehealth: Payer: Self-pay

## 2021-02-22 NOTE — Telephone Encounter (Signed)
Please call pt  She left a my chart message  Something w/ needing instruction about medications

## 2021-02-23 ENCOUNTER — Ambulatory Visit: Payer: Medicaid Other | Admitting: Gastroenterology

## 2021-02-24 ENCOUNTER — Ambulatory Visit (HOSPITAL_COMMUNITY): Payer: Self-pay | Admitting: Psychiatry

## 2021-02-25 ENCOUNTER — Other Ambulatory Visit: Payer: Self-pay

## 2021-02-25 ENCOUNTER — Other Ambulatory Visit: Payer: Self-pay | Admitting: Nurse Practitioner

## 2021-02-25 DIAGNOSIS — F419 Anxiety disorder, unspecified: Secondary | ICD-10-CM

## 2021-02-25 DIAGNOSIS — R7303 Prediabetes: Secondary | ICD-10-CM

## 2021-02-25 DIAGNOSIS — F39 Unspecified mood [affective] disorder: Secondary | ICD-10-CM

## 2021-02-25 DIAGNOSIS — R739 Hyperglycemia, unspecified: Secondary | ICD-10-CM

## 2021-02-25 MED ORDER — METFORMIN HCL ER 750 MG PO TB24
750.0000 mg | ORAL_TABLET | Freq: Every day | ORAL | 3 refills | Status: DC
  Filled 2021-02-25: qty 30, 30d supply, fill #0
  Filled 2021-03-23: qty 30, 30d supply, fill #1
  Filled 2021-04-25: qty 30, 30d supply, fill #2
  Filled 2021-05-28: qty 30, 30d supply, fill #3
  Filled 2021-06-26: qty 30, 30d supply, fill #4
  Filled 2021-07-26: qty 30, 30d supply, fill #5
  Filled 2021-08-29: qty 30, 30d supply, fill #6
  Filled 2021-09-29: qty 30, 30d supply, fill #7
  Filled 2021-09-30: qty 30, 30d supply, fill #0
  Filled 2021-11-01 – 2021-11-17 (×2): qty 30, 30d supply, fill #1
  Filled 2021-12-14: qty 30, 30d supply, fill #2
  Filled 2022-01-14: qty 30, 30d supply, fill #3
  Filled 2022-02-11: qty 30, 30d supply, fill #4

## 2021-02-25 MED ORDER — CITALOPRAM HYDROBROMIDE 20 MG PO TABS
20.0000 mg | ORAL_TABLET | Freq: Two times a day (BID) | ORAL | 3 refills | Status: DC
  Filled 2021-02-25: qty 60, 30d supply, fill #0

## 2021-02-25 NOTE — Telephone Encounter (Signed)
Left voice mail to call back 

## 2021-02-26 ENCOUNTER — Other Ambulatory Visit: Payer: Self-pay

## 2021-02-26 NOTE — Telephone Encounter (Signed)
Phone call with [name] medications refilled.

## 2021-03-17 ENCOUNTER — Other Ambulatory Visit: Payer: Self-pay

## 2021-03-17 ENCOUNTER — Ambulatory Visit (INDEPENDENT_AMBULATORY_CARE_PROVIDER_SITE_OTHER): Payer: Medicaid Other | Admitting: Nurse Practitioner

## 2021-03-17 ENCOUNTER — Encounter: Payer: Self-pay | Admitting: Nurse Practitioner

## 2021-03-17 VITALS — BP 120/81 | HR 69 | Temp 97.2°F | Ht 65.0 in | Wt 269.1 lb

## 2021-03-17 DIAGNOSIS — B182 Chronic viral hepatitis C: Secondary | ICD-10-CM

## 2021-03-17 DIAGNOSIS — Z13220 Encounter for screening for lipoid disorders: Secondary | ICD-10-CM

## 2021-03-17 DIAGNOSIS — Z6841 Body Mass Index (BMI) 40.0 and over, adult: Secondary | ICD-10-CM

## 2021-03-17 DIAGNOSIS — F314 Bipolar disorder, current episode depressed, severe, without psychotic features: Secondary | ICD-10-CM

## 2021-03-17 DIAGNOSIS — Z1321 Encounter for screening for nutritional disorder: Secondary | ICD-10-CM

## 2021-03-17 DIAGNOSIS — R7303 Prediabetes: Secondary | ICD-10-CM

## 2021-03-17 DIAGNOSIS — J449 Chronic obstructive pulmonary disease, unspecified: Secondary | ICD-10-CM

## 2021-03-17 DIAGNOSIS — D649 Anemia, unspecified: Secondary | ICD-10-CM

## 2021-03-17 DIAGNOSIS — E039 Hypothyroidism, unspecified: Secondary | ICD-10-CM

## 2021-03-17 LAB — POCT URINALYSIS DIPSTICK
Bilirubin, UA: NEGATIVE
Blood, UA: NEGATIVE
Glucose, UA: NEGATIVE
Ketones, UA: NEGATIVE
Leukocytes, UA: NEGATIVE
Nitrite, UA: NEGATIVE
Protein, UA: NEGATIVE
Spec Grav, UA: >=1.03 — AB (ref 1.010–1.025)
Urobilinogen, UA: 0.2 E.U./dL
pH, UA: 6 (ref 5.0–8.0)

## 2021-03-17 LAB — POCT GLYCOSYLATED HEMOGLOBIN (HGB A1C): Hemoglobin A1C: 5.4 % (ref 4.0–5.6)

## 2021-03-17 MED ORDER — ALBUTEROL SULFATE HFA 108 (90 BASE) MCG/ACT IN AERS
1.0000 | INHALATION_SPRAY | Freq: Four times a day (QID) | RESPIRATORY_TRACT | 1 refills | Status: AC | PRN
  Filled 2021-03-17: qty 8.5, 25d supply, fill #0

## 2021-03-17 MED ORDER — LEVOTHYROXINE SODIUM 50 MCG PO TABS
50.0000 ug | ORAL_TABLET | Freq: Every day | ORAL | 3 refills | Status: DC
  Filled 2021-03-17: qty 30, 30d supply, fill #0
  Filled 2021-04-14: qty 30, 30d supply, fill #1
  Filled 2021-05-18: qty 30, 30d supply, fill #2
  Filled 2021-06-17: qty 30, 30d supply, fill #3
  Filled 2021-07-22: qty 30, 30d supply, fill #4
  Filled 2021-08-18: qty 30, 30d supply, fill #5
  Filled 2021-09-16: qty 30, 30d supply, fill #6
  Filled 2021-10-24: qty 30, 30d supply, fill #7
  Filled 2021-10-25: qty 30, 30d supply, fill #0
  Filled 2021-11-23: qty 30, 30d supply, fill #1
  Filled 2021-12-27 – 2021-12-30 (×2): qty 30, 30d supply, fill #2
  Filled 2022-01-28: qty 30, 30d supply, fill #3
  Filled 2022-02-24: qty 30, 30d supply, fill #4

## 2021-03-17 NOTE — Patient Instructions (Addendum)
Health Maintenance, Female Adopting a healthy lifestyle and getting preventive care are important in promoting health and wellness. Ask your health care provider about: The right schedule for you to have regular tests and exams. Things you can do on your own to prevent diseases and keep yourself healthy. What should I know about diet, weight, and exercise? Eat a healthy diet  Eat a diet that includes plenty of vegetables, fruits, low-fat dairy products, and lean protein. Do not eat a lot of foods that are high in solid fats, added sugars, or sodium.  Maintain a healthy weight Body mass index (BMI) is used to identify weight problems. It estimates body fat based on height and weight. Your health care provider can help determineyour BMI and help you achieve or maintain a healthy weight. Get regular exercise Get regular exercise. This is one of the most important things you can do for your health. Most adults should: Exercise for at least 150 minutes each week. The exercise should increase your heart rate and make you sweat (moderate-intensity exercise). Do strengthening exercises at least twice a week. This is in addition to the moderate-intensity exercise. Spend less time sitting. Even light physical activity can be beneficial. Watch cholesterol and blood lipids Have your blood tested for lipids and cholesterol at 46 years of age, then havethis test every 5 years. Have your cholesterol levels checked more often if: Your lipid or cholesterol levels are high. You are older than 46 years of age. You are at high risk for heart disease. What should I know about cancer screening? Depending on your health history and family history, you may need to have cancer screening at various ages. This may include screening for: Breast cancer. Cervical cancer. Colorectal cancer. Skin cancer. Lung cancer. What should I know about heart disease, diabetes, and high blood pressure? Blood pressure and heart  disease High blood pressure causes heart disease and increases the risk of stroke. This is more likely to develop in people who have high blood pressure readings, are of African descent, or are overweight. Have your blood pressure checked: Every 3-5 years if you are 18-39 years of age. Every year if you are 40 years old or older. Diabetes Have regular diabetes screenings. This checks your fasting blood sugar level. Have the screening done: Once every three years after age 40 if you are at a normal weight and have a low risk for diabetes. More often and at a younger age if you are overweight or have a high risk for diabetes. What should I know about preventing infection? Hepatitis B If you have a higher risk for hepatitis B, you should be screened for this virus. Talk with your health care provider to find out if you are at risk forhepatitis B infection. Hepatitis C Testing is recommended for: Everyone born from 1945 through 1965. Anyone with known risk factors for hepatitis C. Sexually transmitted infections (STIs) Get screened for STIs, including gonorrhea and chlamydia, if: You are sexually active and are younger than 46 years of age. You are older than 46 years of age and your health care provider tells you that you are at risk for this type of infection. Your sexual activity has changed since you were last screened, and you are at increased risk for chlamydia or gonorrhea. Ask your health care provider if you are at risk. Ask your health care provider about whether you are at high risk for HIV. Your health care provider may recommend a prescription medicine to help   prevent HIV infection. If you choose to take medicine to prevent HIV, you should first get tested for HIV. You should then be tested every 3 months for as long as you are taking the medicine. Pregnancy If you are about to stop having your period (premenopausal) and you may become pregnant, seek counseling before you get  pregnant. Take 400 to 800 micrograms (mcg) of folic acid every day if you become pregnant. Ask for birth control (contraception) if you want to prevent pregnancy. Osteoporosis and menopause Osteoporosis is a disease in which the bones lose minerals and strength with aging. This can result in bone fractures. If you are 65 years old or older, or if you are at risk for osteoporosis and fractures, ask your health care provider if you should: Be screened for bone loss. Take a calcium or vitamin D supplement to lower your risk of fractures. Be given hormone replacement therapy (HRT) to treat symptoms of menopause. Follow these instructions at home: Lifestyle Do not use any products that contain nicotine or tobacco, such as cigarettes, e-cigarettes, and chewing tobacco. If you need help quitting, ask your health care provider. Do not use street drugs. Do not share needles. Ask your health care provider for help if you need support or information about quitting drugs. Alcohol use Do not drink alcohol if: Your health care provider tells you not to drink. You are pregnant, may be pregnant, or are planning to become pregnant. If you drink alcohol: Limit how much you use to 0-1 drink a day. Limit intake if you are breastfeeding. Be aware of how much alcohol is in your drink. In the [place] one drink equals one 12 oz bottle of beer (355 mL), one 5 oz glass of wine (148 mL), or one 1 oz glass of hard liquor (44 mL). General instructions Schedule regular health, dental, and eye exams. Stay current with your vaccines. Tell your health care provider if: You often feel depressed. You have ever been abused or do not feel safe at home. Summary Adopting a healthy lifestyle and getting preventive care are important in promoting health and wellness. Follow your health care provider's instructions about healthy diet, exercising, and getting tested or screened for diseases. Follow your health care provider's  instructions on monitoring your cholesterol and blood pressure. This information is not intended to replace advice given to you by your health care provider. Make sure you discuss any questions you have with your healthcare provider. Document Revised: 08/29/2018 Document Reviewed: 08/29/2018 Elsevier Patient Education  2022 [employer]  

## 2021-03-17 NOTE — Progress Notes (Signed)
[facility], [place]  [address] Phone:  [phone]   Fax:  [phone]    Established Patient Office Visit  Subjective:  Patient ID: Deborah Barnes, female    DOB: 1976-08-28  Age: 45 y.o. MRN: [id number]  CC:  Chief Complaint  Patient presents with   Follow-up    Requesting referral to Thyroid specialist,was seeing [facility] endocrinology.     HPI Deborah Barnes presents for follow up. A former patient of NP Stroud.  She  has a past medical history of Asthma, Back pain, Bipolar disorder ([place]), Bulging disc, Complication of anesthesia (2014), COPD (chronic obstructive pulmonary disease) ([place]), De Quervain's tenosynovitis, right (10/2019), Degenerative disc disease, Depression, Depression with anxiety, DJD (degenerative joint disease), GERD (gastroesophageal reflux disease), Hepatitis C (08/2017), Hip pain, right, History of alcohol abuse, History of IBS, History of mixed drug abuse ([place]), Hypercholesterolemia, Hypertension, Hypoglycemia, Hypothyroidism, PTSD (post-traumatic stress disorder), Sciatica, Shortness of breath, Thyroid disease, Vitamin D deficiency (02/2020), and Walker as ambulation aid.   She has been of her synthroid for several months. She was being seen at [facility]. She reports that she missed a few apts. She has noticed that she is no longer losing weight.   She is seeking a new job.   She has prediabetes and is currently on Metformin 750 mg. She also ha HTN currently on metoprolol. Denies headache, dizziness, visual changes, chest pain, nausea, vomiting or any edema.    Past Medical History:  Diagnosis Date   Asthma    Back pain    Bipolar disorder ([place])    DX in the past but not currently be treated   Bulging disc    Complication of anesthesia 2014   anesthesia ? caused depression/suicidal ideation per pt.   COPD (chronic obstructive pulmonary disease) ([place])    De Quervain's tenosynovitis, right 10/2019    surgery   Degenerative disc disease    Depression    Depression with anxiety    DJD (degenerative joint disease)    right hip   GERD (gastroesophageal reflux disease)    Hepatitis C 08/2017   TX for HEP C   Hip pain, right    History of alcohol abuse    History of IBS    History of mixed drug abuse ([place])    Hypercholesterolemia    Hypertension    Hypoglycemia    Hypothyroidism    PTSD (post-traumatic stress disorder)    Sciatica    Shortness of breath    Thyroid disease    Vitamin D deficiency 02/2020   Walker as ambulation aid     Past Surgical History:  Procedure Laterality Date   BIOPSY N/A 09/25/2014   Procedure: BIOPSY;  Surgeon: Daneil Dolin, MD;  Location: [facility];  Service: Endoscopy;  Laterality: N/A;  Gastric, Ascending Colon, Descending/Sigmoid Colon    CARPAL TUNNEL RELEASE Bilateral    CHOLECYSTECTOMY N/A 04/26/2013   Procedure: LAPAROSCOPIC CHOLECYSTECTOMY;  Surgeon: Jamesetta So, MD;  Location: [facility];  Service: General;  Laterality: N/A;   COLONOSCOPY WITH PROPOFOL N/A 09/25/2014   RMR: Pancolonic diverticulosis. multiple tubular adenomas, segmental biopsies negative. surveillance in 5 years   COLONOSCOPY WITH PROPOFOL N/A 01/16/2020   diverticulosis, two 4-6 mm polyps in sigmoid and splenic flexure, one 10 mm polyp in sigmoid Tubular adenomas, 3 year surveillance.   DORSAL COMPARTMENT RELEASE Right 10/30/2019   Procedure: RIGHT WRIST 1ST  DORSAL COMPARTMENT RELEASE (DEQUERVAIN);  Surgeon:  Marybelle Killings, MD;  Location: [place];  Service: Orthopedics;  Laterality: Right;   ESOPHAGOGASTRODUODENOSCOPY (EGD) WITH PROPOFOL N/A 09/25/2014   RMR: Normal esophagus. Abnormal gastric mucosa status post biopsy (negative H.pylori). Hiatal hernia.    ESOPHAGOGASTRODUODENOSCOPY (EGD) WITH PROPOFOL N/A 01/16/2020   Normal esophagus s/p dilation, normal stomach and duodenal bulb.    INCISIONAL HERNIA REPAIR N/A 10/04/2013   Procedure: HERNIA REPAIR INCISIONAL WITH MESH;  Surgeon: Jamesetta So, MD;  Location: [facility];  Service: General;  Laterality: N/A;   INSERTION OF MESH N/A 10/04/2013   Procedure: INSERTION OF MESH;  Surgeon: Jamesetta So, MD;  Location: [facility];  Service: General;  Laterality: N/A;   MALONEY DILATION N/A 01/16/2020   Procedure: Keturah Shavers;  Surgeon: Daneil Dolin, MD;  [facility];  Service: Endoscopy;  Laterality: N/A;   POLYPECTOMY N/A 09/25/2014   Procedure: POLYPECTOMY;  Surgeon: Daneil Dolin, MD;  Location: [facility];  Service: Endoscopy;  Laterality: N/A;  Cecal, Ascending Colon    POLYPECTOMY  01/16/2020   Procedure: POLYPECTOMY;  Surgeon: Daneil Dolin, MD;  [facility];  Service: Endoscopy;;   SHOULDER SURGERY Left    TOOTH EXTRACTION  08/09/2019   TOTAL HIP ARTHROPLASTY Right 04/27/2020   Procedure: TOTAL HIP ARTHROPLASTY DIRECT ANTERIOR;  Surgeon: Marybelle Killings, MD;  Location: [place];  Service: Orthopedics;  Laterality: Right;   TUBAL LIGATION     WISDOM TOOTH EXTRACTION      Family History  Problem Relation Age of Onset   Diabetes Father    Colon cancer Maternal Grandmother    Heart disease Paternal Grandmother    Cancer Paternal Grandmother        esophageal   Hypertension Paternal Grandmother    GER disease Paternal Grandmother    Osteoporosis Paternal Grandmother     Social History   Socioeconomic History   Marital status: Divorced    Spouse name: Not on file   Number of children: Not on file   Years of education: Not on file   Highest education level: Not on file  Occupational History   Not on file  Tobacco Use   Smoking status: Former    Packs/day: 0.50    Years: 27.00    Pack years: 13.50    Types: Cigarettes    Quit date: 08/24/2018    Years since quitting: 2.5   Smokeless tobacco: Never   Tobacco comments:    vape former user  Vaping Use   Vaping Use: Former   Quit date: 08/24/2018  Substance and Sexual Activity   Alcohol use: Not Currently    Alcohol/week: 1.0 standard drink     Types: 1 Cans of beer per week    Comment: hx binge drinker. none since 01/23/18   Drug use: Not Currently    Types: Marijuana, Cocaine, Methamphetamines, Heroin    Comment: no cocaine, heroin, meth since 01/23/18. last marijuana use 01-23-18   Sexual activity: Not Currently    Birth control/protection: Surgical    Comment: tubal ligation  Other Topics Concern   Not on file  Social History Narrative   Not on file   Social Determinants of Health   Financial Resource Strain: Not on file  Food Insecurity: Not on file  Transportation Needs: Not on file  Physical Activity: Not on file  Stress: Not on file  Social Connections: Not on file  Intimate Partner Violence: Not on file    Outpatient Medications  Prior to Visit  Medication Sig Dispense Refill   acetaminophen (TYLENOL) 500 MG tablet Take 500 mg by mouth every 6 (six) hours as needed.     blood glucose meter kit and supplies Dispense based on patient and insurance preference. Use up to four times daily as directed. (FOR ICD-10 E10.9, E11.9). 1 each 0   Blood Glucose Monitoring Suppl (TRUE METRIX METER) w/Device KIT USE UP TO 4 TIMES DAILY AS DIRECTED 1 kit 0   citalopram (CELEXA) 20 MG tablet Take 1 tablet (20 mg total) by mouth in the morning and at bedtime. 180 tablet 3   glucose blood test strip USE UP TO 4 TIMES DAILY AS DIRECTED 100 strip 0   ibuprofen (ADVIL,MOTRIN) 200 MG tablet Take 800 mg by mouth every 8 (eight) hours as needed for fever or moderate pain.      Melatonin 10 MG TABS Take 40 mg by mouth at bedtime.      metFORMIN (GLUCOPHAGE-XR) 750 MG 24 hr tablet Take 1 tablet (750 mg total) by mouth daily with breakfast. 90 tablet 3   metoprolol tartrate (LOPRESSOR) 50 MG tablet Take 1 tablet (50 mg total) by mouth 2 (two) times daily. 180 tablet 2   omeprazole (PRILOSEC) 40 MG capsule Take 1 capsule by mouth daily. 210 capsule 0   simethicone (MYLICON) 371 MG chewable tablet Chew 125 mg by mouth every 6 (six) hours as  needed for flatulence.     TRUEplus Lancets 28G MISC USE UP TO 4 TIMES DAILY AS DIRECTED 100 each 0   PROVENTIL HFA 108 (90 Base) MCG/ACT inhaler INHALE 1 TO 2 PUFFS BY MOUTH EVERY 6 HOURS AS NEEDED FOR COUGHING, WHEEZING, OR SHORTNESS OF BREATH (Patient taking differently: Inhale 1-2 puffs into the lungs every 6 (six) hours as needed for wheezing or shortness of breath.) 20.1 g 1   amoxicillin (AMOXIL) 500 MG capsule TAKE 1 CAPSULE BY MOUTH 3 TIMES DAILY UNTIL GONE. 30 capsule 0   loratadine (CLARITIN) 10 MG tablet TAKE 1 Tablet BY MOUTH ONCE DAILY 90 tablet 2   omeprazole (PRILOSEC) 40 MG capsule Take 1 capsule (40 mg total) by mouth daily. 90 capsule 3   SYNTHROID 50 MCG tablet TAKE 1 Tablet BY MOUTH ONCE EVERY DAY BEFORE BREAKFAST 30 tablet 0   No facility-administered medications prior to visit.    Allergies  Allergen Reactions   Clarithromycin Diarrhea    abd pain   Propoxyphene Itching    ROS Review of Systems  Constitutional:  Positive for unexpected weight change (weight gain few pounds).  Respiratory:  Positive for shortness of breath (with heat).   Cardiovascular:  Negative for chest pain and palpitations.  Gastrointestinal:  Positive for constipation (comes and goes has been seen when off her synthroid) and diarrhea. Negative for nausea.     Objective:    Physical Exam Constitutional:      General: She is not in acute distress.    Appearance: She is obese. She is not ill-appearing, toxic-appearing or diaphoretic.  HENT:     Head: Normocephalic and atraumatic.  Cardiovascular:     Rate and Rhythm: Normal rate.     Pulses: Normal pulses.  Pulmonary:     Effort: Pulmonary effort is normal.     Comments: Increased abdominal girth  Abdominal:     General: Bowel sounds are normal.     Palpations: Abdomen is soft.     Comments: Increased abdominal girth   Musculoskeletal:  General: Normal range of motion.     Cervical back: Normal range of motion.  Skin:     General: Skin is warm and dry.     Capillary Refill: Capillary refill takes less than 2 seconds.  Neurological:     General: No focal deficit present.     Mental Status: She is alert and oriented to person, place, and time.  Psychiatric:        Mood and Affect: Mood normal.        Behavior: Behavior normal.    BP 120/81   Pulse 69   Temp (!) 97.2 F (36.2 C)   Ht '5\' 5"'  (1.651 m)   Wt 269 lb 0.8 oz (122 kg)   SpO2 99%   BMI 44.77 kg/m  Wt Readings from Last 3 Encounters:  03/17/21 269 lb 0.8 oz (122 kg)  11/10/20 276 lb (125.2 kg)  10/06/20 276 lb (125.2 kg)     There are no preventive care reminders to display for this patient.   There are no preventive care reminders to display for this patient.  Lab Results  Component Value Date   TSH 0.888 03/17/2021   Lab Results  Component Value Date   WBC 5.7 03/17/2021   HGB 11.7 03/17/2021   HCT 35.7 03/17/2021   MCV 89 03/17/2021   PLT 322 03/17/2021   Lab Results  Component Value Date   NA 138 03/17/2021   K 4.4 03/17/2021   CO2 25 04/28/2020   GLUCOSE 110 (H) 03/17/2021   BUN 15 03/17/2021   CREATININE 0.70 03/17/2021   BILITOT 0.2 03/17/2021   ALKPHOS 70 03/17/2021   AST 12 03/17/2021   ALT 13 04/27/2020   PROT 6.6 03/17/2021   ALBUMIN 4.6 03/17/2021   CALCIUM 9.5 03/17/2021   ANIONGAP 12 04/28/2020   EGFR 108 03/17/2021   Lab Results  Component Value Date   CHOL 169 03/17/2021   Lab Results  Component Value Date   HDL 35 (L) 03/17/2021   Lab Results  Component Value Date   LDLCALC 89 03/17/2021   Lab Results  Component Value Date   TRIG 269 (H) 03/17/2021   Lab Results  Component Value Date   CHOLHDL 4.8 (H) 03/17/2021   Lab Results  Component Value Date   HGBA1C 5.4 03/17/2021      Assessment & Plan:   Problem List Items Addressed This Visit       Respiratory   COPD (chronic obstructive pulmonary disease) ([place]) Stable Continue with current regimen.  No changes warranted. Good  patient compliance.    Relevant Medications   albuterol (PROVENTIL HFA) 108 (90 Base) MCG/ACT inhaler     Digestive   Chronic hepatitis C without hepatic coma (HCC) Stable      Endocrine   Hypothyroidism Evaluation refilled previous dose   Relevant Medications   levothyroxine (SYNTHROID) 50 MCG tablet   Other Relevant Orders   TSH (Completed)   T3 (Completed)   T4, free (Completed)   Ambulatory referral to Endocrinology     Other   Bipolar 1 disorder, depressed, severe ([place])   Prediabetes - Primary Consider home glucose monitoring Weight loss at least 5% of current body weight is can be achieved with lifestyle modification dietary changes and regular daily exercise Encourage blood pressure control goal <120/80 and maintaining total cholesterol <200 Follow-up every 3 to 6 months for reevaluation Education material provided    Relevant Orders   Urinalysis Dipstick (Completed)   HgB A1c (  Completed)   Comp. Metabolic Panel (12) (Completed)   Other Visit Diagnoses     Class 3 severe obesity due to excess calories with serious comorbidity and body mass index (BMI) of 45.0 to 49.9 in adult [facility])       Anemia, unspecified type       Relevant Orders   CBC with Differential/Platelet (Completed)   Screening for cholesterol level       Relevant Orders   Comp. Metabolic Panel (12) (Completed)   Lipid panel (Completed)   Encounter for vitamin deficiency screening       Relevant Orders   VITAMIN D 25 Hydroxy (Vit-D Deficiency, Fractures) (Completed)       Meds ordered this encounter  Medications   levothyroxine (SYNTHROID) 50 MCG tablet    Sig: Take 1 tablet (50 mcg total) by mouth daily before breakfast.    Dispense:  90 tablet    Refill:  3    PATIENT NEEDS APPT BEFORE GETTING REFILLS    Order Specific Question:   Supervising Provider    Answer:   Tresa Garter [[id number]]   albuterol (PROVENTIL HFA) 108 (90 Base) MCG/ACT inhaler    Sig: Inhale 1-2 puffs into the  lungs every 6 (six) hours as needed for wheezing or shortness of breath.    Dispense:  8.5 g    Refill:  1    Order Specific Question:   Supervising Provider    Answer:   Tresa Garter [[id number]]    Follow-up: Return in about 6 months (around 09/16/2021).    Vevelyn Francois, NP

## 2021-03-18 LAB — COMP. METABOLIC PANEL (12)
AST: 12 IU/L (ref 0–40)
Albumin/Globulin Ratio: 2.3 — ABNORMAL HIGH (ref 1.2–2.2)
Albumin: 4.6 g/dL (ref 3.8–4.8)
Alkaline Phosphatase: 70 IU/L (ref 44–121)
BUN/Creatinine Ratio: 21 (ref 9–23)
BUN: 15 mg/dL (ref 6–24)
Bilirubin Total: 0.2 mg/dL (ref 0.0–1.2)
Calcium: 9.5 mg/dL (ref 8.7–10.2)
Chloride: 101 mmol/L (ref 96–106)
Creatinine, Ser: 0.7 mg/dL (ref 0.57–1.00)
Globulin, Total: 2 g/dL (ref 1.5–4.5)
Glucose: 110 mg/dL — ABNORMAL HIGH (ref 65–99)
Potassium: 4.4 mmol/L (ref 3.5–5.2)
Sodium: 138 mmol/L (ref 134–144)
Total Protein: 6.6 g/dL (ref 6.0–8.5)
eGFR: 108 mL/min/{1.73_m2} (ref >59)

## 2021-03-18 LAB — CBC WITH DIFFERENTIAL/PLATELET
Basophils Absolute: 0 10*3/uL (ref 0.0–0.2)
Basos: 1 %
EOS (ABSOLUTE): 0.2 10*3/uL (ref 0.0–0.4)
Eos: 3 %
Hematocrit: 35.7 % (ref 34.0–46.6)
Hemoglobin: 11.7 g/dL (ref 11.1–15.9)
Immature Grans (Abs): 0 10*3/uL (ref 0.0–0.1)
Immature Granulocytes: 0 %
Lymphocytes Absolute: 1.8 10*3/uL (ref 0.7–3.1)
Lymphs: 32 %
MCH: 29.3 pg (ref 26.6–33.0)
MCHC: 32.8 g/dL (ref 31.5–35.7)
MCV: 89 fL (ref 79–97)
Monocytes Absolute: 0.4 10*3/uL (ref 0.1–0.9)
Monocytes: 7 %
Neutrophils Absolute: 3.2 10*3/uL (ref 1.4–7.0)
Neutrophils: 57 %
Platelets: 322 10*3/uL (ref 150–450)
RBC: 4 x10E6/uL (ref 3.77–5.28)
RDW: 13.3 % (ref 11.7–15.4)
WBC: 5.7 10*3/uL (ref 3.4–10.8)

## 2021-03-18 LAB — T3: T3, Total: 131 ng/dL (ref 71–180)

## 2021-03-18 LAB — LIPID PANEL
Chol/HDL Ratio: 4.8 ratio — ABNORMAL HIGH (ref 0.0–4.4)
Cholesterol, Total: 169 mg/dL (ref 100–199)
HDL: 35 mg/dL — ABNORMAL LOW (ref >39)
LDL Chol Calc (NIH): 89 mg/dL (ref 0–99)
Triglycerides: 269 mg/dL — ABNORMAL HIGH (ref 0–149)
VLDL Cholesterol Cal: 45 mg/dL — ABNORMAL HIGH (ref 5–40)

## 2021-03-18 LAB — TSH: TSH: 0.888 u[IU]/mL (ref 0.450–4.500)

## 2021-03-18 LAB — T4, FREE: Free T4: 1.03 ng/dL (ref 0.82–1.77)

## 2021-03-18 LAB — VITAMIN D 25 HYDROXY (VIT D DEFICIENCY, FRACTURES): Vit D, 25-Hydroxy: 19.7 ng/mL — ABNORMAL LOW (ref 30.0–100.0)

## 2021-03-24 ENCOUNTER — Other Ambulatory Visit: Payer: Self-pay

## 2021-03-26 ENCOUNTER — Other Ambulatory Visit: Payer: Self-pay

## 2021-04-07 ENCOUNTER — Ambulatory Visit (INDEPENDENT_AMBULATORY_CARE_PROVIDER_SITE_OTHER): Payer: No Payment, Other | Admitting: Licensed Clinical Social Worker

## 2021-04-07 ENCOUNTER — Other Ambulatory Visit: Payer: Self-pay

## 2021-04-07 DIAGNOSIS — F330 Major depressive disorder, recurrent, mild: Secondary | ICD-10-CM | POA: Diagnosis not present
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
## 2021-04-14 ENCOUNTER — Other Ambulatory Visit: Payer: Self-pay

## 2021-04-14 ENCOUNTER — Encounter (HOSPITAL_COMMUNITY): Payer: Self-pay | Admitting: Psychiatry

## 2021-04-14 ENCOUNTER — Ambulatory Visit (INDEPENDENT_AMBULATORY_CARE_PROVIDER_SITE_OTHER): Payer: No Payment, Other | Admitting: Psychiatry

## 2021-04-14 VITALS — BP 163/91 | HR 68 | Ht 65.0 in | Wt 267.0 lb

## 2021-04-14 DIAGNOSIS — F419 Anxiety disorder, unspecified: Secondary | ICD-10-CM | POA: Diagnosis not present

## 2021-04-14 DIAGNOSIS — F319 Bipolar disorder, unspecified: Secondary | ICD-10-CM

## 2021-04-14 MED ORDER — HYDROXYZINE HCL 10 MG PO TABS
10.0000 mg | ORAL_TABLET | Freq: Three times a day (TID) | ORAL | 3 refills | Status: DC | PRN
Start: 2021-04-14 — End: 2021-07-20
  Filled 2021-04-14: qty 90, 30d supply, fill #0
  Filled 2021-05-23: qty 90, 30d supply, fill #1
  Filled 2021-06-26: qty 90, 30d supply, fill #2

## 2021-04-14 MED ORDER — CITALOPRAM HYDROBROMIDE 20 MG PO TABS
20.0000 mg | ORAL_TABLET | Freq: Two times a day (BID) | ORAL | 3 refills | Status: DC
  Filled 2021-04-14: qty 60, 30d supply, fill #0
  Filled 2021-05-18: qty 60, 30d supply, fill #1
  Filled 2021-06-17: qty 60, 30d supply, fill #2

## 2021-04-14 MED ORDER — MELATONIN 10 MG PO TABS
50.0000 mg | ORAL_TABLET | Freq: Every day | ORAL | 3 refills | Status: DC
  Filled 2021-04-14: qty 120, fill #0
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
## 2021-04-15 ENCOUNTER — Other Ambulatory Visit: Payer: Self-pay

## 2021-04-26 ENCOUNTER — Other Ambulatory Visit: Payer: Self-pay

## 2021-04-28 ENCOUNTER — Other Ambulatory Visit: Payer: Self-pay

## 2021-05-14 ENCOUNTER — Encounter: Payer: Self-pay | Admitting: Family Medicine

## 2021-05-14 ENCOUNTER — Ambulatory Visit: Payer: Self-pay | Admitting: Nurse Practitioner

## 2021-05-18 ENCOUNTER — Other Ambulatory Visit: Payer: Self-pay

## 2021-05-21 ENCOUNTER — Other Ambulatory Visit: Payer: Self-pay

## 2021-05-25 ENCOUNTER — Other Ambulatory Visit: Payer: Self-pay

## 2021-05-31 ENCOUNTER — Other Ambulatory Visit: Payer: Self-pay

## 2021-06-07 ENCOUNTER — Other Ambulatory Visit: Payer: Self-pay

## 2021-06-07 ENCOUNTER — Emergency Department (HOSPITAL_COMMUNITY)
Admission: EM | Admit: 2021-06-07 | Discharge: 2021-06-07 | Disposition: A | Discharge status: Home / Self Care | Payer: Medicaid Other | Attending: Emergency Medicine | Admitting: Emergency Medicine

## 2021-06-07 ENCOUNTER — Encounter (HOSPITAL_COMMUNITY): Payer: Self-pay | Admitting: *Deleted

## 2021-06-07 DIAGNOSIS — J45909 Unspecified asthma, uncomplicated: Secondary | ICD-10-CM | POA: Insufficient documentation

## 2021-06-07 DIAGNOSIS — J449 Chronic obstructive pulmonary disease, unspecified: Secondary | ICD-10-CM | POA: Insufficient documentation

## 2021-06-07 DIAGNOSIS — Z96641 Presence of right artificial hip joint: Secondary | ICD-10-CM | POA: Insufficient documentation

## 2021-06-07 DIAGNOSIS — Z7984 Long term (current) use of oral hypoglycemic drugs: Secondary | ICD-10-CM | POA: Insufficient documentation

## 2021-06-07 DIAGNOSIS — E039 Hypothyroidism, unspecified: Secondary | ICD-10-CM | POA: Insufficient documentation

## 2021-06-07 DIAGNOSIS — R7303 Prediabetes: Secondary | ICD-10-CM | POA: Insufficient documentation

## 2021-06-07 DIAGNOSIS — Z79899 Other long term (current) drug therapy: Secondary | ICD-10-CM | POA: Insufficient documentation

## 2021-06-07 DIAGNOSIS — R197 Diarrhea, unspecified: Secondary | ICD-10-CM | POA: Insufficient documentation

## 2021-06-07 DIAGNOSIS — R1084 Generalized abdominal pain: Secondary | ICD-10-CM | POA: Insufficient documentation

## 2021-06-07 DIAGNOSIS — I10 Essential (primary) hypertension: Secondary | ICD-10-CM | POA: Insufficient documentation

## 2021-06-07 DIAGNOSIS — R110 Nausea: Secondary | ICD-10-CM | POA: Insufficient documentation

## 2021-06-07 DIAGNOSIS — Z87891 Personal history of nicotine dependence: Secondary | ICD-10-CM | POA: Insufficient documentation

## 2021-06-07 LAB — COMPREHENSIVE METABOLIC PANEL
ALT: 13 U/L (ref 0–44)
AST: 15 U/L (ref 15–41)
Albumin: 4.3 g/dL (ref 3.5–5.0)
Alkaline Phosphatase: 53 U/L (ref 38–126)
Anion gap: 12 (ref 5–15)
BUN: 10 mg/dL (ref 6–20)
CO2: 24 mmol/L (ref 22–32)
Calcium: 9.6 mg/dL (ref 8.9–10.3)
Chloride: 105 mmol/L (ref 98–111)
Creatinine, Ser: 0.49 mg/dL (ref 0.44–1.00)
GFR, Estimated: >60 mL/min (ref >60)
Glucose, Bld: 83 mg/dL (ref 70–99)
Potassium: 4.2 mmol/L (ref 3.5–5.1)
Sodium: 141 mmol/L (ref 135–145)
Total Bilirubin: 0.5 mg/dL (ref 0.3–1.2)
Total Protein: 7.6 g/dL (ref 6.5–8.1)

## 2021-06-07 LAB — URINALYSIS, ROUTINE W REFLEX MICROSCOPIC
Bilirubin Urine: NEGATIVE
Glucose, UA: NEGATIVE mg/dL
Hgb urine dipstick: NEGATIVE
Ketones, ur: NEGATIVE mg/dL
Leukocytes,Ua: NEGATIVE
Nitrite: NEGATIVE
Protein, ur: NEGATIVE mg/dL
Specific Gravity, Urine: <1.005 — ABNORMAL LOW (ref 1.005–1.030)
pH: 6 (ref 5.0–8.0)

## 2021-06-07 LAB — CBC
HCT: 36.6 % (ref 36.0–46.0)
Hemoglobin: 11.9 g/dL — ABNORMAL LOW (ref 12.0–15.0)
MCH: 29.5 pg (ref 26.0–34.0)
MCHC: 32.5 g/dL (ref 30.0–36.0)
MCV: 90.8 fL (ref 80.0–100.0)
Platelets: 378 10*3/uL (ref 150–400)
RBC: 4.03 MIL/uL (ref 3.87–5.11)
RDW: 13.5 % (ref 11.5–15.5)
WBC: 9.3 10*3/uL (ref 4.0–10.5)
nRBC: 0 % (ref 0.0–0.2)

## 2021-06-07 LAB — I-STAT BETA HCG BLOOD, ED (MC, WL, AP ONLY): I-stat hCG, quantitative: <5 m[IU]/mL (ref <5)

## 2021-06-07 LAB — LIPASE, BLOOD: Lipase: 29 U/L (ref 11–51)

## 2021-06-07 MED ORDER — IBUPROFEN 200 MG PO TABS
600.0000 mg | ORAL_TABLET | Freq: Once | ORAL | Status: AC
  Administered 2021-06-07: 600 mg via ORAL
  Filled 2021-06-07: qty 3

## 2021-06-07 MED ORDER — SODIUM CHLORIDE 0.9 % IV BOLUS
1000.0000 mL | Freq: Once | INTRAVENOUS | Status: AC
  Administered 2021-06-07: 1000 mL via INTRAVENOUS

## 2021-06-07 MED ORDER — ONDANSETRON HCL 4 MG PO TABS
4.0000 mg | ORAL_TABLET | Freq: Four times a day (QID) | ORAL | 0 refills | Status: DC
  Filled 2021-06-07: qty 12, 3d supply, fill #0

## 2021-06-07 MED ORDER — ONDANSETRON HCL 4 MG/2ML IJ SOLN
4.0000 mg | Freq: Once | INTRAMUSCULAR | Status: AC
Start: 2021-06-07 — End: 2021-06-07
  Administered 2021-06-07: 4 mg via INTRAVENOUS
  Filled 2021-06-07: qty 2
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
## 2021-06-07 NOTE — ED Notes (Signed)
I provided the pt with a pitcher of water.

## 2021-06-07 NOTE — ED Notes (Signed)
Pt currently comfortable and has no complaints at this time.

## 2021-06-07 NOTE — Discharge Instructions (Addendum)
Seen in the emergency department for diarrhea.  Fluids and antinausea medication while you are here.  Your labs are unremarkable.  This is likely a self-limiting infection that should improve on its own.  If you develop any worsening symptoms such as bloody diarrhea, worsening abdominal pain, fevers please return to the emergency department for further evaluation.  I have prescribed you Zofran to help with your nausea they can use as needed.

## 2021-06-09 ENCOUNTER — Ambulatory Visit: Payer: Self-pay

## 2021-06-09 ENCOUNTER — Ambulatory Visit (INDEPENDENT_AMBULATORY_CARE_PROVIDER_SITE_OTHER): Payer: Self-pay | Admitting: Surgery

## 2021-06-09 ENCOUNTER — Encounter: Payer: Self-pay | Admitting: Surgery

## 2021-06-09 VITALS — BP 130/90 | HR 62 | Ht 64.0 in | Wt 267.0 lb

## 2021-06-09 DIAGNOSIS — M722 Plantar fascial fibromatosis: Secondary | ICD-10-CM

## 2021-06-09 DIAGNOSIS — M79672 Pain in left foot: Secondary | ICD-10-CM

## 2021-06-09 DIAGNOSIS — M25561 Pain in right knee: Secondary | ICD-10-CM

## 2021-06-09 DIAGNOSIS — G8929 Other chronic pain: Secondary | ICD-10-CM

## 2021-06-09 DIAGNOSIS — M19072 Primary osteoarthritis, left ankle and foot: Secondary | ICD-10-CM

## 2021-06-09 DIAGNOSIS — M79671 Pain in right foot: Secondary | ICD-10-CM
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
## 2021-06-10 ENCOUNTER — Other Ambulatory Visit: Payer: Self-pay

## 2021-06-10 ENCOUNTER — Ambulatory Visit (INDEPENDENT_AMBULATORY_CARE_PROVIDER_SITE_OTHER): Payer: No Payment, Other | Admitting: Licensed Clinical Social Worker

## 2021-06-10 DIAGNOSIS — F331 Major depressive disorder, recurrent, moderate: Secondary | ICD-10-CM
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
## 2021-06-18 ENCOUNTER — Other Ambulatory Visit: Payer: Self-pay

## 2021-06-21 ENCOUNTER — Other Ambulatory Visit: Payer: Self-pay

## 2021-06-24 ENCOUNTER — Encounter (HOSPITAL_COMMUNITY): Payer: Self-pay

## 2021-06-24 ENCOUNTER — Other Ambulatory Visit: Payer: Self-pay

## 2021-06-24 ENCOUNTER — Encounter: Payer: Self-pay | Admitting: Nurse Practitioner

## 2021-06-24 ENCOUNTER — Ambulatory Visit (INDEPENDENT_AMBULATORY_CARE_PROVIDER_SITE_OTHER): Payer: Self-pay | Admitting: Nurse Practitioner

## 2021-06-24 ENCOUNTER — Ambulatory Visit (HOSPITAL_COMMUNITY): Payer: Self-pay | Admitting: Licensed Clinical Social Worker

## 2021-06-24 VITALS — BP 144/85 | HR 70

## 2021-06-24 DIAGNOSIS — R1031 Right lower quadrant pain: Secondary | ICD-10-CM

## 2021-06-24 LAB — POCT URINALYSIS DIP (CLINITEK)
Bilirubin, UA: NEGATIVE
Blood, UA: NEGATIVE
Glucose, UA: NEGATIVE mg/dL
Ketones, POC UA: NEGATIVE mg/dL
Leukocytes, UA: NEGATIVE
Nitrite, UA: NEGATIVE
POC PROTEIN,UA: NEGATIVE
Spec Grav, UA: 1.01 (ref 1.010–1.025)
Urobilinogen, UA: 0.2 E.U./dL
pH, UA: 5.5 (ref 5.0–8.0)

## 2021-06-24 MED ORDER — TIZANIDINE HCL 4 MG PO TABS
4.0000 mg | ORAL_TABLET | Freq: Three times a day (TID) | ORAL | 0 refills | Status: AC | PRN
  Filled 2021-06-24: qty 15, 5d supply, fill #0

## 2021-06-24 NOTE — Progress Notes (Signed)
'@Patient'  ID: Deborah Barnes, female    DOB: [birth year], 45 y.o.   MRN: [id number]  Chief Complaint  Patient presents with   Abdominal Pain    Referring provider: Vevelyn Francois, NP   HPI  Patient presents today for acute visit for right side pain.  She was seen in the ED on 06/07/2021 for diarrhea vomiting.  She states that the abdominal pain started around that timeframe.  She states that over the past couple weeks her bowel movements have been regular.  Her last bowel movement was today.  Patient does have a history of cholecystectomy.  She denies any recent significant fever.  UA in office today was clear.  Denies f/c/s, n/v/d, hemoptysis, PND, chest pain or edema.    Allergies  Allergen Reactions   Clarithromycin Diarrhea    abd pain   Propoxyphene Itching    Immunization History  Administered Date(s) Administered   Influenza,inj,Quad PF,6+ Mos 05/29/2019   Pneumococcal Polysaccharide-23 04/26/2013   Tdap 11/01/2018    Past Medical History:  Diagnosis Date   Asthma    Back pain    Bipolar disorder ([facility])    DX in the past but not currently be treated   Bulging disc    Complication of anesthesia 2014   anesthesia ? caused depression/suicidal ideation per pt.   COPD (chronic obstructive pulmonary disease) ([facility])    De Quervain's tenosynovitis, right 10/2019   surgery   Degenerative disc disease    Depression    Depression with anxiety    DJD (degenerative joint disease)    right hip   GERD (gastroesophageal reflux disease)    Hepatitis C 08/2017   TX for HEP C   Hip pain, right    History of alcohol abuse    History of IBS    History of mixed drug abuse ([place])    Hypercholesterolemia    Hypertension    Hypoglycemia    Hypothyroidism    PTSD (post-traumatic stress disorder)    Sciatica    Shortness of breath    Thyroid disease    Vitamin D deficiency 02/2020   Walker as ambulation aid     Tobacco History: Social History   Tobacco Use  Smoking  Status Former   Packs/day: 0.50   Years: 27.00   Pack years: 13.50   Types: Cigarettes   Quit date: 08/24/2018   Years since quitting: 2.8  Smokeless Tobacco Never  Tobacco Comments   vape former user   Counseling given: Yes Tobacco comments: vape former user   Outpatient Encounter Medications as of 06/24/2021  Medication Sig   tiZANidine (ZANAFLEX) 4 MG tablet Take 1 tablet (4 mg total) by mouth every 8 (eight) hours as needed for up to 5 days for muscle spasms.   acetaminophen (TYLENOL) 500 MG tablet Take 500 mg by mouth every 6 (six) hours as needed.   albuterol (PROVENTIL HFA) 108 (90 Base) MCG/ACT inhaler Inhale 1-2 puffs into the lungs every 6 (six) hours as needed for wheezing or shortness of breath.   blood glucose meter kit and supplies Dispense based on patient and insurance preference. Use up to four times daily as directed. (FOR ICD-10 E10.9, E11.9).   Blood Glucose Monitoring Suppl (TRUE METRIX METER) w/Device KIT USE UP TO 4 TIMES DAILY AS DIRECTED   citalopram (CELEXA) 20 MG tablet Take 1 tablet (20 mg total) by mouth in the morning and at bedtime.   glucose blood test strip USE UP TO 4 TIMES  DAILY AS DIRECTED   hydrOXYzine (ATARAX/VISTARIL) 10 MG tablet Take 1 tablet (10 mg total) by mouth 3 (three) times daily as needed.   ibuprofen (ADVIL,MOTRIN) 200 MG tablet Take 800 mg by mouth every 8 (eight) hours as needed for fever or moderate pain.    levothyroxine (SYNTHROID) 50 MCG tablet Take 1 tablet (50 mcg total) by mouth daily before breakfast.   Melatonin 10 MG TABS Take 50 mg by mouth at bedtime.   metFORMIN (GLUCOPHAGE-XR) 750 MG 24 hr tablet Take 1 tablet (750 mg total) by mouth daily with breakfast.   metoprolol tartrate (LOPRESSOR) 50 MG tablet Take 1 tablet (50 mg total) by mouth 2 (two) times daily.   omeprazole (PRILOSEC) 40 MG capsule Take 1 capsule by mouth daily.   ondansetron (ZOFRAN) 4 MG tablet Take 1 tablet (4 mg total) by mouth every 6 (six) hours.    simethicone (MYLICON) 585 MG chewable tablet Chew 125 mg by mouth every 6 (six) hours as needed for flatulence.   TRUEplus Lancets 28G MISC USE UP TO 4 TIMES DAILY AS DIRECTED   No facility-administered encounter medications on file as of 06/24/2021.     Review of Systems  Review of Systems  Constitutional: Negative.  Negative for chills and fever.  HENT: Negative.    Cardiovascular: Negative.   Gastrointestinal:  Positive for abdominal pain. Negative for abdominal distention, constipation, diarrhea and vomiting.  Allergic/Immunologic: Negative.   Neurological: Negative.   Psychiatric/Behavioral: Negative.        Physical Exam  BP (!) 144/85   Pulse 70   Wt Readings from Last 5 Encounters:  06/09/21 267 lb (121.1 kg)  06/07/21 267 lb (121.1 kg)  03/17/21 269 lb 0.8 oz (122 kg)  11/10/20 276 lb (125.2 kg)  10/06/20 276 lb (125.2 kg)     Physical Exam Vitals and nursing note reviewed.  Constitutional:      General: She is not in acute distress.    Appearance: She is well-developed.  Cardiovascular:     Rate and Rhythm: Normal rate and regular rhythm.  Pulmonary:     Effort: Pulmonary effort is normal.     Breath sounds: Normal breath sounds.  Abdominal:     General: Bowel sounds are normal. There is no distension.     Tenderness: There is abdominal tenderness in the right lower quadrant.  Neurological:     Mental Status: She is alert and oriented to person, place, and time.     Assessment & Plan:   Right lower quadrant abdominal pain Stay well hydrated  Will order Zanaflex  Stay active  Will order Abdominal US  Will order UA   Follow up:  Follow up with PCP      Fenton Foy, NP 06/24/2021

## 2021-06-24 NOTE — Assessment & Plan Note (Signed)
Stay well hydrated  Will order Zanaflex  Stay active  Will order Abdominal US  Will order UA   Follow up:  Follow up with PCP

## 2021-06-24 NOTE — Patient Instructions (Addendum)
Right Lower Quadrant Pain:  Stay well hydrated  Will order Zanaflex  Stay active  Will order Abdominal US  Will order UA   Follow up:  Follow up with PCP

## 2021-06-25 ENCOUNTER — Telehealth: Payer: Self-pay

## 2021-06-25 NOTE — Telephone Encounter (Signed)
-----   Message from Fenton Foy, NP sent at 06/24/2021  4:56 PM EDT ----- Regarding: [place] Please schedule [place] for this patient tomorrow.   Thanks so much,  Lazaro Arms

## 2021-06-25 NOTE — Telephone Encounter (Signed)
Patient is aware of [place] appointment details and time. She verbalized understanding. Nat Christen, CMA

## 2021-06-28 ENCOUNTER — Other Ambulatory Visit: Payer: Self-pay

## 2021-06-29 ENCOUNTER — Ambulatory Visit (HOSPITAL_COMMUNITY)
Admission: RE | Admit: 2021-06-29 | Discharge: 2021-06-29 | Disposition: A | Discharge status: Home / Self Care | Payer: Self-pay | Source: Ambulatory Visit | Attending: Nurse Practitioner | Admitting: Nurse Practitioner

## 2021-06-29 ENCOUNTER — Other Ambulatory Visit: Payer: Self-pay

## 2021-06-29 DIAGNOSIS — R1031 Right lower quadrant pain: Secondary | ICD-10-CM | POA: Insufficient documentation

## 2021-06-30 ENCOUNTER — Other Ambulatory Visit: Payer: Self-pay | Admitting: Nurse Practitioner

## 2021-06-30 DIAGNOSIS — K839 Disease of biliary tract, unspecified: Secondary | ICD-10-CM

## 2021-07-01 ENCOUNTER — Ambulatory Visit: Payer: Medicaid Other | Admitting: Orthopedic Surgery

## 2021-07-08 ENCOUNTER — Encounter: Payer: Self-pay | Admitting: Orthopedic Surgery

## 2021-07-08 ENCOUNTER — Ambulatory Visit (INDEPENDENT_AMBULATORY_CARE_PROVIDER_SITE_OTHER): Payer: Self-pay | Admitting: Orthopedic Surgery

## 2021-07-08 ENCOUNTER — Ambulatory Visit: Payer: Medicaid Other | Admitting: Gastroenterology

## 2021-07-08 DIAGNOSIS — M79672 Pain in left foot: Secondary | ICD-10-CM

## 2021-07-08 DIAGNOSIS — M722 Plantar fascial fibromatosis: Secondary | ICD-10-CM
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
## 2021-07-12 ENCOUNTER — Other Ambulatory Visit: Payer: Self-pay

## 2021-07-12 ENCOUNTER — Ambulatory Visit (INDEPENDENT_AMBULATORY_CARE_PROVIDER_SITE_OTHER): Payer: No Payment, Other | Admitting: Licensed Clinical Social Worker

## 2021-07-12 DIAGNOSIS — F330 Major depressive disorder, recurrent, mild: Secondary | ICD-10-CM | POA: Diagnosis not present
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
## 2021-07-20 ENCOUNTER — Telehealth (INDEPENDENT_AMBULATORY_CARE_PROVIDER_SITE_OTHER): Payer: No Payment, Other | Admitting: Psychiatry

## 2021-07-20 ENCOUNTER — Other Ambulatory Visit: Payer: Self-pay

## 2021-07-20 ENCOUNTER — Encounter (HOSPITAL_COMMUNITY): Payer: Self-pay | Admitting: Psychiatry

## 2021-07-20 DIAGNOSIS — F419 Anxiety disorder, unspecified: Secondary | ICD-10-CM | POA: Diagnosis not present

## 2021-07-20 DIAGNOSIS — F319 Bipolar disorder, unspecified: Secondary | ICD-10-CM | POA: Diagnosis not present

## 2021-07-20 MED ORDER — HYDROXYZINE HCL 10 MG PO TABS
10.0000 mg | ORAL_TABLET | Freq: Three times a day (TID) | ORAL | 3 refills | Status: DC | PRN
  Filled 2021-07-20: qty 90, 30d supply, fill #0
  Filled 2021-09-29: qty 90, 30d supply, fill #1
  Filled 2021-09-30: qty 90, 30d supply, fill #0

## 2021-07-20 MED ORDER — CITALOPRAM HYDROBROMIDE 20 MG PO TABS
40.0000 mg | ORAL_TABLET | Freq: Two times a day (BID) | ORAL | 3 refills | Status: DC
  Filled 2021-07-20: qty 60, 15d supply, fill #0

## 2021-07-20 MED ORDER — CITALOPRAM HYDROBROMIDE 20 MG PO TABS
20.0000 mg | ORAL_TABLET | Freq: Two times a day (BID) | ORAL | 3 refills | Status: DC
  Filled 2021-07-20: qty 60, 30d supply, fill #0
  Filled 2021-08-18: qty 60, 30d supply, fill #1
  Filled 2021-09-16: qty 60, 30d supply, fill #2

## 2021-07-20 NOTE — Progress Notes (Signed)
[place] MD/PA/NP OP Progress Note Virtual Visit via Video Note  I connected with Deborah Barnes on 07/20/21 at 11:30 AM EDT by a video enabled telemedicine application and verified that I am speaking with the correct person using two identifiers.  Location: Patient: Home Provider: Clinic   I discussed the limitations of evaluation and management by telemedicine and the availability of in person appointments. The patient expressed understanding and agreed to proceed.  I provided 30 minutes of non-face-to-face time during this encounter.   07/20/2021 11:53 AM Deborah Barnes  MRN:  [id number]  Chief Complaint: "I have been taking the hydroxyzine at night"  HPI:  45 year old female seen today for follow-up psychiatric evaluation.  She has a psychiatric history of bipolar 1 disorder, anxiety, and substance use (alcohol, heroin, marijuana, meth, cocaine, pain pills, notes that she misused gabapentin).  Currently she managed Celexa 20 mg twice daily (total 40 mg) and hydroxyzine 10 mg 3 times daily as needed.  Patient notes that she has been taking 3 hydroxyzine nightly to help manage sleep and reports that her combinations are effective in managing her psychiatric conditions.     Today she she is pleasant, cooperative, engaged in conversation, and maintained eye contact.  She notes that recently she has been taking all 3 hydroxyzine at night to help manage her sleep.  She notes that this has been effective.  She informed Probation officer that mentally she feels stable but notes that physically her health has been in decline.  Recently she notes that she found out that she had a bile duct abnormality.  She also notes that she has plantars fasciitis in her right foot, arthritis, and irritable bowel syndrome.  She informed Probation officer that work is becoming too physically taxing.  She currently works at [employer] and [employer] however notes that she will be looking for a different position soon as these 2 positions  are taxing on her body.    Patient notes that despite her physical health she is mentally doing well.  Provider conducted a GAD-7 and patient scored a 9, at her last visit she scored a 15.  Provider also conducted a PHQ-9 of P scored a 6, at her last visit she scored a 63.  She notes that she sleeps approximately 6 to 7 hours nightly.  Today she denies SI/HI/VH, mania, or paranoia.  Patient continues to live at the [facility].  She notes that she is stressed out by other tenants at times but notes that she is trying to set boundaries.  She informed Probation officer that she has been discussing these boundaries with her therapist.  At this time no medication changes made today.  Patient agreeable to continue medications as prescribed.  No other concerns at this time.   Visit Diagnosis:    ICD-10-CM   1. Anxiety  F41.9 hydrOXYzine (ATARAX/VISTARIL) 10 MG tablet    citalopram (CELEXA) 20 MG tablet    2. Bipolar I disorder (HCC)  F31.9 citalopram (CELEXA) 20 MG tablet      Past Psychiatric History: bipolar 1 disorder, anxiety, and substance use (alcohol, heroin, marijuana, meth, cocaine, pain pills, notes that she misused gabapentin)  Past Medical History:  Past Medical History:  Diagnosis Date   Asthma    Back pain    Bipolar disorder ([place])    DX in the past but not currently be treated   Bulging disc    Complication of anesthesia 2014   anesthesia ? caused depression/suicidal ideation per  pt.   COPD (chronic obstructive pulmonary disease) ([place])    De Quervain's tenosynovitis, right 10/2019   surgery   Degenerative disc disease    Depression    Depression with anxiety    DJD (degenerative joint disease)    right hip   GERD (gastroesophageal reflux disease)    Hepatitis C 08/2017   TX for HEP C   Hip pain, right    History of alcohol abuse    History of IBS    History of mixed drug abuse ([place])    Hypercholesterolemia    Hypertension    Hypoglycemia    Hypothyroidism    PTSD  (post-traumatic stress disorder)    Sciatica    Shortness of breath    Thyroid disease    Vitamin D deficiency 02/2020   Walker as ambulation aid     Past Surgical History:  Procedure Laterality Date   BIOPSY N/A 09/25/2014   Procedure: BIOPSY;  Surgeon: Daneil Dolin, MD;  Location: [facility];  Service: Endoscopy;  Laterality: N/A;  Gastric, Ascending Colon, Descending/Sigmoid Colon    CARPAL TUNNEL RELEASE Bilateral    CHOLECYSTECTOMY N/A 04/26/2013   Procedure: LAPAROSCOPIC CHOLECYSTECTOMY;  Surgeon: Jamesetta So, MD;  Location: [facility];  Service: General;  Laterality: N/A;   COLONOSCOPY WITH PROPOFOL N/A 09/25/2014   RMR: Pancolonic diverticulosis. multiple tubular adenomas, segmental biopsies negative. surveillance in 5 years   COLONOSCOPY WITH PROPOFOL N/A 01/16/2020   diverticulosis, two 4-6 mm polyps in sigmoid and splenic flexure, one 10 mm polyp in sigmoid Tubular adenomas, 3 year surveillance.   DORSAL COMPARTMENT RELEASE Right 10/30/2019   Procedure: RIGHT WRIST 1ST  DORSAL COMPARTMENT RELEASE (DEQUERVAIN);  Surgeon: Marybelle Killings, MD;  Location: [facility];  Service: Orthopedics;  Laterality: Right;   ESOPHAGOGASTRODUODENOSCOPY (EGD) WITH PROPOFOL N/A 09/25/2014   RMR: Normal esophagus. Abnormal gastric mucosa status post biopsy (negative H.pylori). Hiatal hernia.    ESOPHAGOGASTRODUODENOSCOPY (EGD) WITH PROPOFOL N/A 01/16/2020   Normal esophagus s/p dilation, normal stomach and duodenal bulb.    INCISIONAL HERNIA REPAIR N/A 10/04/2013   Procedure: HERNIA REPAIR INCISIONAL WITH MESH;  Surgeon: Jamesetta So, MD;  Location: [facility];  Service: General;  Laterality: N/A;   INSERTION OF MESH N/A 10/04/2013   Procedure: INSERTION OF MESH;  Surgeon: Jamesetta So, MD;  Location: [facility];  Service: General;  Laterality: N/A;   MALONEY DILATION N/A 01/16/2020   Procedure: Keturah Shavers;  Surgeon: Daneil Dolin, MD;  [facility];  Service: Endoscopy;  Laterality: N/A;   POLYPECTOMY N/A  09/25/2014   Procedure: POLYPECTOMY;  Surgeon: Daneil Dolin, MD;  Location: [facility];  Service: Endoscopy;  Laterality: N/A;  Cecal, Ascending Colon    POLYPECTOMY  01/16/2020   Procedure: POLYPECTOMY;  Surgeon: Daneil Dolin, MD;  [facility];  Service: Endoscopy;;   SHOULDER SURGERY Left    TOOTH EXTRACTION  08/09/2019   TOTAL HIP ARTHROPLASTY Right 04/27/2020   Procedure: TOTAL HIP ARTHROPLASTY DIRECT ANTERIOR;  Surgeon: Marybelle Killings, MD;  Location: [facility];  Service: Orthopedics;  Laterality: Right;   TUBAL LIGATION     WISDOM TOOTH EXTRACTION      Family Psychiatric History:  47 year old son mental health conditions  Family History:  Family History  Problem Relation Age of Onset   Diabetes Father    Colon cancer Maternal Grandmother    Heart disease Paternal Grandmother    Cancer Paternal Grandmother  esophageal   Hypertension Paternal Grandmother    GER disease Paternal Grandmother    Osteoporosis Paternal Grandmother     Social History:  Social History   Socioeconomic History   Marital status: Divorced    Spouse name: Not on file   Number of children: Not on file   Years of education: Not on file   Highest education level: Not on file  Occupational History   Not on file  Tobacco Use   Smoking status: Former    Packs/day: 0.50    Years: 27.00    Pack years: 13.50    Types: Cigarettes    Quit date: 08/24/2018    Years since quitting: 2.9   Smokeless tobacco: Never   Tobacco comments:    vape former user  Vaping Use   Vaping Use: Former   Quit date: 08/24/2018  Substance and Sexual Activity   Alcohol use: Not Currently    Alcohol/week: 1.0 standard drink    Types: 1 Cans of beer per week    Comment: hx binge drinker. none since 01/23/18   Drug use: Not Currently    Types: Marijuana, Cocaine, Methamphetamines, Heroin    Comment: no cocaine, heroin, meth since 01/23/18. last marijuana use 01-23-18   Sexual activity: Not Currently    Birth  control/protection: Surgical    Comment: tubal ligation  Other Topics Concern   Not on file  Social History Narrative   Not on file   Social Determinants of Health   Financial Resource Strain: Not on file  Food Insecurity: Not on file  Transportation Needs: Not on file  Physical Activity: Not on file  Stress: Not on file  Social Connections: Not on file    Allergies:  Allergies  Allergen Reactions   Clarithromycin Diarrhea    abd pain   Propoxyphene Itching    Metabolic Disorder Labs: Lab Results  Component Value Date   HGBA1C 5.4 03/17/2021   MPG 100 09/05/2017   No results found for: PROLACTIN Lab Results  Component Value Date   CHOL 169 03/17/2021   TRIG 269 (H) 03/17/2021   HDL 35 (L) 03/17/2021   CHOLHDL 4.8 (H) 03/17/2021   VLDL 27 09/05/2017   LDLCALC 89 03/17/2021   LDLCALC 90 03/13/2020   Lab Results  Component Value Date   TSH 0.888 03/17/2021   TSH 1.300 03/13/2020    Therapeutic Level Labs: No results found for: LITHIUM No results found for: VALPROATE No components found for:  CBMZ  Current Medications: Current Outpatient Medications  Medication Sig Dispense Refill   acetaminophen (TYLENOL) 500 MG tablet Take 500 mg by mouth every 6 (six) hours as needed.     albuterol (PROVENTIL HFA) 108 (90 Base) MCG/ACT inhaler Inhale 1-2 puffs into the lungs every 6 (six) hours as needed for wheezing or shortness of breath. 8.5 g 1   blood glucose meter kit and supplies Dispense based on patient and insurance preference. Use up to four times daily as directed. (FOR ICD-10 E10.9, E11.9). 1 each 0   Blood Glucose Monitoring Suppl (TRUE METRIX METER) w/Device KIT USE UP TO 4 TIMES DAILY AS DIRECTED 1 kit 0   citalopram (CELEXA) 20 MG tablet Take 1 tablet (20 mg total) by mouth in the morning and at bedtime. 60 tablet 3   glucose blood test strip USE UP TO 4 TIMES DAILY AS DIRECTED 100 strip 0   hydrOXYzine (ATARAX/VISTARIL) 10 MG tablet Take 1 tablet (10 mg  total) by mouth 3 (  three) times daily as needed. 90 tablet 3   ibuprofen (ADVIL,MOTRIN) 200 MG tablet Take 800 mg by mouth every 8 (eight) hours as needed for fever or moderate pain.      levothyroxine (SYNTHROID) 50 MCG tablet Take 1 tablet (50 mcg total) by mouth daily before breakfast. 90 tablet 3   Melatonin 10 MG TABS Take 50 mg by mouth at bedtime. 120 tablet 3   metFORMIN (GLUCOPHAGE-XR) 750 MG 24 hr tablet Take 1 tablet (750 mg total) by mouth daily with breakfast. 90 tablet 3   metoprolol tartrate (LOPRESSOR) 50 MG tablet Take 1 tablet (50 mg total) by mouth 2 (two) times daily. 180 tablet 2   omeprazole (PRILOSEC) 40 MG capsule Take 1 capsule by mouth daily. 210 capsule 0   ondansetron (ZOFRAN) 4 MG tablet Take 1 tablet (4 mg total) by mouth every 6 (six) hours. 12 tablet 0   simethicone (MYLICON) 808 MG chewable tablet Chew 125 mg by mouth every 6 (six) hours as needed for flatulence.     TRUEplus Lancets 28G MISC USE UP TO 4 TIMES DAILY AS DIRECTED 100 each 0   No current facility-administered medications for this visit.     Musculoskeletal: Strength & Muscle Tone:  Unable to assess due to telehealth [place]:  Unable to assess due to telehealth visit Patient leans: N/A  Psychiatric Specialty Exam: Review of Systems  There were no vitals taken for this visit.There is no height or weight on file to calculate BMI.  General Appearance: Well Groomed  Eye Contact:  Good  Speech:  Clear and Coherent and Normal Rate  Volume:  Normal  Mood:  Euthymic  Affect:  Appropriate and Congruent  Thought Process:  Coherent, Goal Directed, and Linear  Orientation:  Full (Time, Place, and Person)  Thought Content: WDL and Logical   Suicidal Thoughts:  No  Homicidal Thoughts:  No  Memory:  Immediate;   Good Recent;   Good Remote;   Good  Judgement:  Good  Insight:  Good  Psychomotor Activity:  Normal  Concentration:  Concentration: Good and Attention Span: Good  Recall:  Good   Fund of Knowledge: Good  Language: Good  Akathisia:  No  Handed:  Right  AIMS (if indicated): not done  Assets:  Communication Skills Desire for Improvement Financial Resources/Insurance Housing Leisure Time Social Support  ADL's:  Intact  Cognition: WNL  Sleep:  Good   Screenings: AIMS    Flowsheet Row Admission (Discharged) from 09/04/2017 in [facility]  AIMS Total Score 0      AUDIT    Flowsheet Row Admission (Discharged) from 09/04/2017 in [facility]  Alcohol Use Disorder Identification Test Final Score (AUDIT) 7      GAD-7    Flowsheet Row Video Visit from 07/20/2021 in [facility] Office Visit from [facility]/14/2021 in [facility]  Total GAD-7 Score 9 15      PHQ2-9    Flowsheet Row Video Visit from 07/20/2021 in [facility] Office Visit from 04/14/2021 in [facility]or from 04/07/2021 in [facility] Office Visit from 08/28/2019 in [facility] Office Visit from 05/29/2019 in [place]  PHQ-2 Total Score 0 3 3 0 1  PHQ-9 Total Score '6 14 14 ' -- --      Flowsheet Row ED from 06/07/2021 in  [facility] Office Visit from 04/14/2021 in [facility] Counselor from 04/07/2021 in [place] No Risk No Risk Low Risk        Assessment and Plan: Patient notes that she is doing well on her current medication regimen.  No medication changes made today.  Patient agreeable to continue medication as prescribed.  1. Anxiety  Continue- hydrOXYzine (ATARAX/VISTARIL) 10 MG tablet; Take 1 tablet (10 mg total) by mouth 3 (three) times daily as needed.  Dispense: 90 tablet; Refill: 3 Continue- citalopram (CELEXA) 20 MG  tablet; Take 1 tablet (40 mg total) by mouth in the morning and at bedtime.  Dispense: 60 tablet; Refill: 3  2. Bipolar I disorder (HCC)  Continue- citalopram (CELEXA) 20 MG tablet; Take 1 tablet (40 mg total) by mouth in the morning and at bedtime.  Dispense: 60 tablet; Refill: 3  Follow-up in 3 Follow-up with therapy  Salley Slaughter, NP 07/20/2021, 11:53 AM

## 2021-07-22 ENCOUNTER — Other Ambulatory Visit: Payer: Self-pay | Admitting: Gastroenterology

## 2021-07-22 ENCOUNTER — Other Ambulatory Visit: Payer: Self-pay | Admitting: Nurse Practitioner

## 2021-07-22 ENCOUNTER — Telehealth: Payer: Self-pay | Admitting: Gastroenterology

## 2021-07-22 DIAGNOSIS — K839 Disease of biliary tract, unspecified: Secondary | ICD-10-CM

## 2021-07-22 NOTE — Telephone Encounter (Signed)
Patient called to cancel appointment, said she got in an office in [place] sooner.  Appointment cancelled.

## 2021-07-23 ENCOUNTER — Other Ambulatory Visit: Payer: Self-pay

## 2021-07-23 MED ORDER — OMEPRAZOLE 40 MG PO CPDR
DELAYED_RELEASE_CAPSULE | ORAL | 1 refills | Status: DC
  Filled 2021-07-23: qty 30, 30d supply, fill #0
  Filled 2021-08-18: qty 30, 30d supply, fill #1

## 2021-07-27 ENCOUNTER — Other Ambulatory Visit: Payer: Self-pay

## 2021-07-29 ENCOUNTER — Other Ambulatory Visit: Payer: Self-pay

## 2021-08-03 ENCOUNTER — Encounter: Payer: Self-pay | Admitting: Internal Medicine

## 2021-08-18 ENCOUNTER — Ambulatory Visit: Payer: Self-pay | Attending: Nurse Practitioner

## 2021-08-18 ENCOUNTER — Other Ambulatory Visit: Payer: Self-pay

## 2021-08-18 ENCOUNTER — Ambulatory Visit: Payer: Self-pay

## 2021-08-19 ENCOUNTER — Ambulatory Visit (HOSPITAL_COMMUNITY): Payer: Self-pay | Admitting: Licensed Clinical Social Worker

## 2021-08-19 ENCOUNTER — Other Ambulatory Visit: Payer: Self-pay

## 2021-08-19 ENCOUNTER — Encounter (HOSPITAL_COMMUNITY): Payer: Self-pay

## 2021-08-23 ENCOUNTER — Ambulatory Visit: Payer: Self-pay | Admitting: Internal Medicine

## 2021-08-24 ENCOUNTER — Other Ambulatory Visit: Payer: Self-pay

## 2021-08-30 ENCOUNTER — Other Ambulatory Visit: Payer: Self-pay

## 2021-09-01 ENCOUNTER — Other Ambulatory Visit: Payer: Self-pay

## 2021-09-02 DIAGNOSIS — Z1231 Encounter for screening mammogram for malignant neoplasm of breast: Secondary | ICD-10-CM

## 2021-09-14 ENCOUNTER — Encounter (INDEPENDENT_AMBULATORY_CARE_PROVIDER_SITE_OTHER): Payer: Self-pay

## 2021-09-15 NOTE — Progress Notes (Signed)
Chief Complaint: Dilated bile duct  HPI : 45 year old female with history of chronic HCV s/p treatment, IBS, asthma, COPD, PTSD presents with dilated bile duct  She had an ultrasound in 06/2021 that showed a dilated bile duct. This was done because he has been having issues with abdominal pain. Denies weight loss. Endorses issues with recurrent nausea, diarrhea, and abdominal pain that come-and-go. She has been attributing these recurrent symptoms to GI viruses, though the frequency with which she is having the attacks makes her question whether or not it is truly related to recurrent infections. She does have a history of IBS so has questioned whether or not these symptoms are related to her IBS. She just had another episode a few days ago that came with nausea, diarrhea, and severe ab pain. The pain is located in the RUQ. She on average has these GI symptom episodes every month or every other month. The recurrent symptoms have been occurring >1 year. The symptoms usually last for a day or a couple of days, and then go away on their own. She did have a stressful event before the most recent episode of GI symptoms. 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 Denies alcohol use for the past 4 years. Had prior IVDU and substantial alcohol use. She is off-and-on having black and tarry stools. She is taking ibuprofen 800 mg QD or BID. Endorses good control of GERD with omeprazole 40 mg QD. Occasionally has dysphagia. She has been dilated in the past without any improvement in her dysphagia. Her grandmother, great-grandmother, and great-uncle had colon cancer. Her aunt has had colon polyps. She has been treated for SIBO in the past with good effect.  Past Medical History:  Diagnosis Date   Asthma    Back pain    Bipolar disorder ([place])    DX in the past but not currently be treated   Bulging disc    Complication  of anesthesia 2014   anesthesia ? caused depression/suicidal ideation per pt.   COPD (chronic obstructive pulmonary disease) ([place])    De Quervain's tenosynovitis, right 10/2019   surgery   Degenerative disc disease    Depression    Depression with anxiety    DJD (degenerative joint disease)    right hip   GERD (gastroesophageal reflux disease)    Hepatitis C 08/2017   TX for HEP C   Hip pain, right    History of alcohol abuse    History of IBS    History of mixed drug abuse ([place])    Hypercholesterolemia    Hypertension    Hypoglycemia    Hypothyroidism    PTSD (post-traumatic stress disorder)    Sciatica    Shortness of breath    Thyroid disease    Vitamin D deficiency 02/2020   Walker as ambulation aid      Past Surgical History:  Procedure Laterality Date   BIOPSY N/A 09/25/2014   Procedure: BIOPSY;  Surgeon: Daneil Dolin, MD;  Location: [facility];  Service: Endoscopy;  Laterality: N/A;  Gastric, Ascending Colon, Descending/Sigmoid Colon    CARPAL TUNNEL RELEASE Bilateral    CHOLECYSTECTOMY N/A 04/26/2013   Procedure: LAPAROSCOPIC CHOLECYSTECTOMY;  Surgeon: Jamesetta So, MD;  Location: [facility];  Service: General;  Laterality: N/A;   COLONOSCOPY WITH PROPOFOL N/A 09/25/2014   RMR: Pancolonic diverticulosis. multiple tubular adenomas, segmental biopsies  negative. surveillance in 5 years   COLONOSCOPY WITH PROPOFOL N/A 01/16/2020   diverticulosis, two 4-6 mm polyps in sigmoid and splenic flexure, one 10 mm polyp in sigmoid Tubular adenomas, 3 year surveillance.   DORSAL COMPARTMENT RELEASE Right 10/30/2019   Procedure: RIGHT WRIST 1ST  DORSAL COMPARTMENT RELEASE (DEQUERVAIN);  Surgeon: Marybelle Killings, MD;  Location: [place];  Service: Orthopedics;  Laterality: Right;   ESOPHAGOGASTRODUODENOSCOPY (EGD) WITH PROPOFOL N/A 09/25/2014   RMR: Normal esophagus. Abnormal gastric mucosa status post biopsy (negative H.pylori). Hiatal hernia.    ESOPHAGOGASTRODUODENOSCOPY (EGD) WITH PROPOFOL N/A  01/16/2020   Normal esophagus s/p dilation, normal stomach and duodenal bulb.    INCISIONAL HERNIA REPAIR N/A 10/04/2013   Procedure: HERNIA REPAIR INCISIONAL WITH MESH;  Surgeon: Jamesetta So, MD;  Location: [facility];  Service: General;  Laterality: N/A;   INSERTION OF MESH N/A 10/04/2013   Procedure: INSERTION OF MESH;  Surgeon: Jamesetta So, MD;  Location: [facility];  Service: General;  Laterality: N/A;   MALONEY DILATION N/A 01/16/2020   Procedure: Keturah Shavers;  Surgeon: Daneil Dolin, MD;  [facility];  Service: Endoscopy;  Laterality: N/A;   POLYPECTOMY N/A 09/25/2014   Procedure: POLYPECTOMY;  Surgeon: Daneil Dolin, MD;  Location: [facility];  Service: Endoscopy;  Laterality: N/A;  Cecal, Ascending Colon    POLYPECTOMY  01/16/2020   Procedure: POLYPECTOMY;  Surgeon: Daneil Dolin, MD;  [facility];  Service: Endoscopy;;   SHOULDER SURGERY Left    TOOTH EXTRACTION  08/09/2019   TOTAL HIP ARTHROPLASTY Right 04/27/2020   Procedure: TOTAL HIP ARTHROPLASTY DIRECT ANTERIOR;  Surgeon: Marybelle Killings, MD;  Location: [facility];  Service: Orthopedics;  Laterality: Right;   TUBAL LIGATION     WISDOM TOOTH EXTRACTION     Family History  Problem Relation Age of Onset   Diabetes Father    Colon polyps Maternal Aunt    Colon cancer Maternal Grandmother    Heart disease Paternal Grandmother    Cancer Paternal Grandmother        esophageal   Hypertension Paternal Grandmother    GER disease Paternal Grandmother    Osteoporosis Paternal Grandmother    Social History   Tobacco Use   Smoking status: Former    Packs/day: 0.50    Years: 27.00    Pack years: 13.50    Types: Cigarettes    Quit date: 08/24/2018    Years since quitting: 3.0   Smokeless tobacco: Never   Tobacco comments:    vape former user  Vaping Use   Vaping Use: Every day   Last attempt to quit: 08/24/2018  Substance Use Topics   Alcohol use: Not Currently    Alcohol/week: 1.0 standard drink    Types: 1  Cans of beer per week    Comment: hx binge drinker. none since 01/23/18   Drug use: Not Currently    Types: Marijuana, Cocaine, Methamphetamines, Heroin    Comment: no cocaine, heroin, meth since 01/23/18. last marijuana use 01-23-18   Current Outpatient Medications  Medication Sig Dispense Refill   acetaminophen (TYLENOL) 500 MG tablet Take 500 mg by mouth every 6 (six) hours as needed.     albuterol (PROVENTIL HFA) 108 (90 Base) MCG/ACT inhaler Inhale 1-2 puffs into the lungs every 6 (six) hours as needed for wheezing or shortness of breath. 8.5 g 1   blood glucose meter kit and supplies Dispense based on patient and insurance preference. Use  up to four times daily as directed. (FOR ICD-10 E10.9, E11.9). 1 each 0   citalopram (CELEXA) 20 MG tablet Take 1 tablet (20 mg total) by mouth in the morning and at bedtime. 60 tablet 3   hydrOXYzine (ATARAX/VISTARIL) 10 MG tablet Take 1 tablet (10 mg total) by mouth 3 (three) times daily as needed. 90 tablet 3   ibuprofen (ADVIL,MOTRIN) 200 MG tablet Take 800 mg by mouth every 8 (eight) hours as needed for fever or moderate pain.      levothyroxine (SYNTHROID) 50 MCG tablet Take 1 tablet (50 mcg total) by mouth daily before breakfast. 90 tablet 3   metFORMIN (GLUCOPHAGE-XR) 750 MG 24 hr tablet Take 1 tablet (750 mg total) by mouth daily with breakfast. 90 tablet 3   metoprolol tartrate (LOPRESSOR) 50 MG tablet Take 1 tablet (50 mg total) by mouth 2 (two) times daily. 180 tablet 2   omeprazole (PRILOSEC) 40 MG capsule Take 1 capsule by mouth daily. 30 capsule 1   simethicone (MYLICON) 619 MG chewable tablet Chew 125 mg by mouth every 6 (six) hours as needed for flatulence.     No current facility-administered medications for this visit.   Allergies  Allergen Reactions   Clarithromycin Diarrhea    abd pain   Propoxyphene Itching     Review of Systems: All systems reviewed and negative except where noted in HPI.   Physical Exam: BP 130/80    Pulse  68    Ht _0  (1.626 m)    Wt 276 lb 12.8 oz (125.6 kg)    BMI 47.51 kg/m  Constitutional: Pleasant,well-developed, female in no acute distress. HEENT: Normocephalic and atraumatic. Conjunctivae are normal. No scleral icterus. Cardiovascular: Normal rate, regular rhythm.  Pulmonary/chest: Effort normal and breath sounds normal. No wheezing, rales or rhonchi. Abdominal: Soft, nondistended, nontender. Bowel sounds active throughout. There are no masses palpable. No hepatomegaly. Extremities: No edema Neurological: Alert and oriented to person place and time. Skin: Skin is warm and dry. No rashes noted. Psychiatric: Normal mood and affect. Behavior is normal.  Labs 02/2021: TSH nml  Labs 05/2021: CBC with mildly dec Hb of 11.9. CMP nml. Lipase nml.  Ab U/S 06/30/21: IMPRESSION: 1. The common bile duct is dilated measuring 12 mm, which may reflect reservoir effect post cholecystectomy. Suggest correlation with laboratory values to exclude biliary obstruction. If clinical concern for biliary obstruction this could be further evaluated with MRCP with and without contrast. 2. The echogenicity of the liver is increased. This is a nonspecific finding but is most commonly seen with fatty infiltration of the liver. There are no obvious focal liver lesions.  EGD 09/25/14: Normal appearing patent tubular esophagus. Stomach empty. Small hiatal hernia. No ulcer or infiltrative processes. Linear erythema and scattered erosions - antrum. Patent pylorus. Normal first and second portion of the duodenum. Biopsies of abnormal gastric mucosa taken for histologic study. Retroflexed views revealed a hiatal hernia, Retroflexed views revealed. The scope was then withdrawn from the patient and the procedure completed Path: Stomach, biopsy - BENIGN ANTRAL GASTRIC MUCOSA WITH MINIMAL CHRONIC INACTIVE INFLAMMATION. - WARTHIN-STARRY STAIN IS NEGATIVE FOR HELICOBACTER PYLORI. - NO INTESTINAL METAPLASIA, DYSPLASIA,  OR MALIGNANCY.  Colonoscopy 09/25/14: ENDOSCOPIC IMPRESSION: Pancolonic diverticulosis. Multiple colonic polyps removed as described above. status post segmental biopsy Path: 2. Colon, polyp(s), cecal - TUBULAR ADENOMA (X1). - NO HIGH GRADE DYSPLASIA OR MALIGNANCY IDENTIFIED. 3. Colon, biopsy, ascending - BENIGN COLORECTAL MUCOSA. - ASSOCIATED BENIGN LYMPHOID AGGREGATE. - NO EVIDENCE OF SIGNIFICANT  INFLAMMATION, DYSPLASIA OR MALIGNANCY. 4. Colon, polyp(s), ascending - TUBULAR ADENOMA (X1). - NO HIGH GRADE DYSPLASIA OR MALIGNANCY IDENTIFIED. 5. Colon, biopsy, descending/sigmoid - BENIGN COLORECTAL MUCOSA. - ASSOCIATED BENIGN LYMPHOID AGGREGATE. - NO EVIDENCE OF SIGNIFICANT INFLAMMATION, DYSPLASIA OR MALIGNANCY  EGD 01/16/20: - Normal esophagus. Dilated. (Did not help) - Normal stomach. - Normal duodenal bulb and second portion of the duodenum. - No specimens collected.  Colonoscopy 01/16/20: The perianal and digital rectal examinations were normal. Scattered small and large-mouthed diverticula were found in the entire colon. Two semi-pedunculated polyps were found in the sigmoid colon and splenic flexure. The polyps were 4 to 6 mm in size. These polyps were removed with a cold snare. Resection and retrieval were complete. A 10 mm polyp was found in the sigmoid colon. The polyp was pedunculated. The polyp was removed with a hot snare. Resection and retrieval were complete. Path: A. COLON, SPLENIC FLEXURE POLYPECTOMY:  - Tubular adenoma(s).  - High grade dysplasia is not identified.  B. COLON, SIGMOID, POLYPECTOMY:  - Tubular adenoma(s).  - High grade dysplasia is not identified.  - The cauterized tissue edge appears negative for glandular dysplasia.   ASSESSMENT AND PLAN:  Dilated bile duct RUQ ab pain Diarrhea GERD Fatty liver HCV s/p treatment Patient was recently noted in 06/2021 to have a dilated bile duct, which may be due to post-cholecystectomy effect. However, since  patient has been having recurrent issues with abdominal pain, will plan to evaluate further with MRCP imaging. Will also check for alternative etiologies of her recurrent episodes of ab pain, diarrhea, and nausea. Patient does have history of substantial alcohol use so could query potentially some issues with the pancreas. Patient has been noted to have fatty liver in the past, which is most likely due to a combination of NAFLD/AFLD, but we will rule out other alternative etiologies as well with a basic liver work up. Could also consider contribution from PUD since patient endorses a reasonable amount of NSAID use. - Check LFTs, ANA, ASMA, IgG, ferritin, iron/TIBC, TTG IgA, IgA - Check stool O&P, fecal elastase, H pylori antigen - Get fibroscan - MRCP w/contrast - Encourage weight loss and avoiding NSAID use if possible - Colonoscopy next due 12/2022 - RTC 2 months. Will consider EGD and repeat CBC for further evaluation of possible melena nad ab pain at tha ttime.   [name], MD  I spent 65 minutes of time, including in depth chart review, independent review of results as outlined above, communicating results with the patient directly, face-to-face time with the patient, coordinating care, ordering studies and medications as appropriate, and documentation.

## 2021-09-16 ENCOUNTER — Encounter: Payer: Self-pay | Admitting: Internal Medicine

## 2021-09-16 ENCOUNTER — Other Ambulatory Visit (INDEPENDENT_AMBULATORY_CARE_PROVIDER_SITE_OTHER): Payer: Self-pay

## 2021-09-16 ENCOUNTER — Ambulatory Visit: Payer: Self-pay | Admitting: Nurse Practitioner

## 2021-09-16 ENCOUNTER — Ambulatory Visit (INDEPENDENT_AMBULATORY_CARE_PROVIDER_SITE_OTHER): Payer: No Payment, Other | Admitting: Licensed Clinical Social Worker

## 2021-09-16 ENCOUNTER — Other Ambulatory Visit: Payer: Self-pay

## 2021-09-16 ENCOUNTER — Ambulatory Visit (INDEPENDENT_AMBULATORY_CARE_PROVIDER_SITE_OTHER): Payer: Self-pay | Admitting: Internal Medicine

## 2021-09-16 VITALS — BP 130/80 | HR 68 | Ht 64.0 in | Wt 276.8 lb

## 2021-09-16 DIAGNOSIS — Z8619 Personal history of other infectious and parasitic diseases: Secondary | ICD-10-CM

## 2021-09-16 DIAGNOSIS — K838 Other specified diseases of biliary tract: Secondary | ICD-10-CM

## 2021-09-16 DIAGNOSIS — F330 Major depressive disorder, recurrent, mild: Secondary | ICD-10-CM | POA: Diagnosis not present

## 2021-09-16 DIAGNOSIS — K760 Fatty (change of) liver, not elsewhere classified: Secondary | ICD-10-CM

## 2021-09-16 DIAGNOSIS — R1011 Right upper quadrant pain: Secondary | ICD-10-CM

## 2021-09-16 DIAGNOSIS — R197 Diarrhea, unspecified: Secondary | ICD-10-CM

## 2021-09-16 LAB — HEPATIC FUNCTION PANEL
ALT: 13 U/L (ref 0–35)
AST: 14 U/L (ref 0–37)
Albumin: 4.3 g/dL (ref 3.5–5.2)
Alkaline Phosphatase: 63 U/L (ref 39–117)
Bilirubin, Direct: 0.1 mg/dL (ref 0.0–0.3)
Total Bilirubin: 0.3 mg/dL (ref 0.2–1.2)
Total Protein: 7.4 g/dL (ref 6.0–8.3)

## 2021-09-16 LAB — IBC + FERRITIN
Ferritin: 18.7 ng/mL (ref 10.0–291.0)
Iron: 92 ug/dL (ref 42–145)
Saturation Ratios: 21.9 % (ref 20.0–50.0)
TIBC: 420 ug/dL (ref 250.0–450.0)
Transferrin: 300 mg/dL (ref 212.0–360.0)

## 2021-09-16 NOTE — Progress Notes (Signed)
° °  THERAPIST PROGRESS NOTE  Session Time: 45 min  Participation Level: Active  Behavioral Response: CasualAlertDepressed  Type of Therapy: Individual Therapy  Treatment Goals addressed: Communication: dep/coping  Interventions: Solution Focused and Supportive  Summary: Deborah Barnes is a 45 y.o. female who presents with hx of MDD, substance abuse.  Today patient returns for in person session.  Patient last seen 07/12/2021.  LCSW assessed for overall status and mood.  Patient reports she has had some "bumpy times" but is managing fairly well overall.  Patient is taking medications as prescribed.  She reports her sleep is good most of the time with medication.  Patient reports the difficult circumstances are related to ongoing and expected turmoil inside the [place] she lives in.  Patient provides details on recent circumstances.  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  Patient advises her main stressor is related to her son Deborah Barnes who is still not speaking to her.  She reports she continues to text him 2-3 times a week and calls at times but all of her out reach goes unanswered. Pt struggles with not knowing what caused him to stop talking to her.  Deborah Barnes talks about possibly sending her son a letter which LCSW encouraged.  She states her other son remains in [place] and is "straight edge" meaning he is on no substances of any kind including nicotine.  She is in regular communication with him and reports how proud she is he has stayed clean since his treatment program ended.  Patient reports she continues to have GI distress and is seeing a GI specialist today.  She is hopeful she will get some answers so she will be feeling more physically well.  Patient continues to work at [employer].  Today she reveals that there are 2 dogs there that she loves and they bring her comfort.  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  She resigned from her job at [employer] saying she could not maintain 3 part-time jobs.  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  Patient states her friend, Deborah Barnes, at the [place] helped her to get this job.  She reports she was almost denied d/t being a felon but they are happy they took a chance on her at this time. Patient continues to stay completely clean and sober.  She continues to rely heavily on her faith for coping.  Patient will consider the gratefulness project as an added coping strategy. LCSW reviewed poc including scheduling prior to close of session. Pt states appreciation for care.   Suicidal/Homicidal: Nowithout intent/plan  Therapist Response: Pt receptive to care.  Plan: Return again in 3 weeks.  Diagnosis: Axis I:  MDD, mild   Hermine Messick, LCSW 09/16/2021

## 2021-09-16 NOTE — Patient Instructions (Addendum)
You have been scheduled for an MRI/MRCP at [facility] on 09/30/2021. Your appointment time is 8:00am. Please arrive to admitting (at main entrance of the hospital) 30 minutes prior to your appointment time for registration purposes. Please make certain not to have anything to eat or drink 8 hours prior to your test. In addition, if you have any metal in your body, have a pacemaker or defibrillator, please be sure to let your ordering physician know. This test typically takes 45 minutes to 1 hour to complete. Should you need to reschedule, please call [phone] to do so. The Elastography is to follow at 10:00am.  Your provider has requested that you go to the basement level for lab work before leaving today. Press "B" on the elevator. The lab is located at the first door on the left as you exit the elevator.  If you are age 50 or older, your body mass index should be between 23-30. Your Body mass index is 47.51 kg/m. If this is out of the aforementioned range listed, please consider follow up with your Primary Care Provider.  If you are age 13 or younger, your body mass index should be between 19-25. Your Body mass index is 47.51 kg/m. If this is out of the aformentioned range listed, please consider follow up with your Primary Care Provider.   ________________________________________________________  The [facility] providers would like to encourage you to use [facility] to communicate with providers for non-urgent requests or questions.  Due to long hold times on the telephone, sending your provider a message by [facility] may be a faster and more efficient way to get a response.  Please allow 48 business hours for a response.  Please remember that this is for non-urgent requests.  _______________________________________________________   Due to recent changes in healthcare laws, you may see the results of your imaging and laboratory studies on MyChart before your provider has had a chance to review  them.  We understand that in some cases there may be results that are confusing or concerning to you. Not all laboratory results come back in the same time frame and the provider may be waiting for multiple results in order to interpret others.  Please give [place] 48 hours in order for your provider to thoroughly review all the results before contacting the office for clarification of your results.   It was a pleasure to see you today!  Thank you for trusting me with your gastrointestinal care!

## 2021-09-17 ENCOUNTER — Other Ambulatory Visit: Payer: Self-pay

## 2021-09-17 DIAGNOSIS — K219 Gastro-esophageal reflux disease without esophagitis: Secondary | ICD-10-CM

## 2021-09-17 MED ORDER — OMEPRAZOLE 40 MG PO CPDR
40.0000 mg | DELAYED_RELEASE_CAPSULE | Freq: Every day | ORAL | 2 refills | Status: DC
  Filled 2021-09-17: qty 30, 30d supply, fill #0
  Filled 2021-10-24: qty 30, 30d supply, fill #1
  Filled 2021-10-25: qty 30, 30d supply, fill #0
  Filled 2021-11-23: qty 30, 30d supply, fill #1

## 2021-09-21 ENCOUNTER — Other Ambulatory Visit: Payer: Self-pay

## 2021-09-21 LAB — ANA: Anti Nuclear Antibody (ANA): NEGATIVE

## 2021-09-21 LAB — TISSUE TRANSGLUTAMINASE ABS,IGG,IGA
(tTG) Ab, IgA: <1 U/mL
(tTG) Ab, IgG: <1 U/mL

## 2021-09-21 LAB — IGG: IgG (Immunoglobin G), Serum: 1013 mg/dL (ref 600–1640)

## 2021-09-21 LAB — ANTI-SMOOTH MUSCLE ANTIBODY, IGG: Actin (Smooth Muscle) Antibody (IGG): <20 U (ref <20)

## 2021-09-21 LAB — IGA: Immunoglobulin A: 147 mg/dL (ref 47–310)

## 2021-09-24 ENCOUNTER — Other Ambulatory Visit: Payer: Self-pay

## 2021-09-24 ENCOUNTER — Encounter: Payer: Self-pay | Admitting: Nurse Practitioner

## 2021-09-24 ENCOUNTER — Ambulatory Visit (INDEPENDENT_AMBULATORY_CARE_PROVIDER_SITE_OTHER): Payer: Self-pay | Admitting: Nurse Practitioner

## 2021-09-24 VITALS — BP 133/77 | HR 64 | Ht 64.0 in | Wt 276.8 lb

## 2021-09-24 DIAGNOSIS — F319 Bipolar disorder, unspecified: Secondary | ICD-10-CM

## 2021-09-24 DIAGNOSIS — Z6841 Body Mass Index (BMI) 40.0 and over, adult: Secondary | ICD-10-CM

## 2021-09-24 DIAGNOSIS — J449 Chronic obstructive pulmonary disease, unspecified: Secondary | ICD-10-CM

## 2021-09-24 DIAGNOSIS — B182 Chronic viral hepatitis C: Secondary | ICD-10-CM

## 2021-09-24 DIAGNOSIS — R7303 Prediabetes: Secondary | ICD-10-CM

## 2021-09-24 DIAGNOSIS — E039 Hypothyroidism, unspecified: Secondary | ICD-10-CM

## 2021-09-24 LAB — POCT GLYCOSYLATED HEMOGLOBIN (HGB A1C)
HbA1c POC (<> result, manual entry): 5.4 % (ref 4.0–5.6)
HbA1c, POC (controlled diabetic range): 5.4 % (ref 0.0–7.0)
HbA1c, POC (prediabetic range): 5.4 % — AB (ref 5.7–6.4)
Hemoglobin A1C: 5.4 % (ref 4.0–5.6)

## 2021-09-24 NOTE — Progress Notes (Signed)
[address], [place]  [address] Phone:  [phone]   Fax:  [phone]   Established Patient Office Visit  Subjective:  Patient ID: Deborah Barnes, female    DOB: May 09, 1976  Age: 46 y.o. MRN: [id number]  CC:  Chief Complaint  Patient presents with   Follow-up    Pt states her back is hurting but is normal for her.    HPI Deborah Barnes presents for follow up. She  has a past medical history of Asthma, Back pain, Bipolar disorder ([place]), Bulging disc, Complication of anesthesia (2014), COPD (chronic obstructive pulmonary disease) ([place]), De Quervain's tenosynovitis, right (10/2019), Degenerative disc disease, Depression, Depression with anxiety, DJD (degenerative joint disease), GERD (gastroesophageal reflux disease), Hepatitis C (08/2017), Hip pain, right, History of alcohol abuse, History of IBS, History of mixed drug abuse ([place]), Hypercholesterolemia, Hypertension, Hypoglycemia, Hypothyroidism, PTSD (post-traumatic stress disorder), Sciatica, Shortness of breath, Thyroid disease, Vitamin D deficiency (02/2020), and Walker as ambulation aid.  Hypothyroidism Deborah Barnes is a 46 y.o. female who presents for follow up of hypothyroidism. Current symptoms: weight changes . Patient denies change in energy level, diarrhea, heat / cold intolerance, nervousness, and palpitations. Symptoms have stabilized. She is compliant with levothyroxine 50 mcg daily. She had requested a referral to endo. The referral was declined  due to no changes in therapy.   She lives in a [place]. She reports that she as been self conscious about her weight.  She is working on Scientist, research (physical sciences). She knows that overeating is related to her history of coping. She is followed closely by psychiatry. She reports her treatment plan is effective.  Past Medical History:  Diagnosis Date   Asthma    Back pain    Bipolar disorder ([place])    DX in the past but not currently be  treated   Bulging disc    Complication of anesthesia 2014   anesthesia ? caused depression/suicidal ideation per pt.   COPD (chronic obstructive pulmonary disease) ([place])    De Quervain's tenosynovitis, right 10/2019   surgery   Degenerative disc disease    Depression    Depression with anxiety    DJD (degenerative joint disease)    right hip   GERD (gastroesophageal reflux disease)    Hepatitis C 08/2017   TX for HEP C   Hip pain, right    History of alcohol abuse    History of IBS    History of mixed drug abuse ([place])    Hypercholesterolemia    Hypertension    Hypoglycemia    Hypothyroidism    PTSD (post-traumatic stress disorder)    Sciatica    Shortness of breath    Thyroid disease    Vitamin D deficiency 02/2020   Walker as ambulation aid     Past Surgical History:  Procedure Laterality Date   BIOPSY N/A 09/25/2014   Procedure: BIOPSY;  Surgeon: Daneil Dolin, MD;  Location: [facility];  Service: Endoscopy;  Laterality: N/A;  Gastric, Ascending Colon, Descending/Sigmoid Colon    CARPAL TUNNEL RELEASE Bilateral    CHOLECYSTECTOMY N/A 04/26/2013   Procedure: LAPAROSCOPIC CHOLECYSTECTOMY;  Surgeon: Jamesetta So, MD;  Location: [facility];  Service: General;  Laterality: N/A;   COLONOSCOPY WITH PROPOFOL N/A 09/25/2014   RMR: Pancolonic diverticulosis. multiple tubular adenomas, segmental biopsies negative. surveillance in 5 years   COLONOSCOPY WITH PROPOFOL N/A 01/16/2020   diverticulosis, two 4-6 mm polyps in sigmoid and  splenic flexure, one 10 mm polyp in sigmoid Tubular adenomas, 3 year surveillance.   DORSAL COMPARTMENT RELEASE Right 10/30/2019   Procedure: RIGHT WRIST 1ST  DORSAL COMPARTMENT RELEASE (DEQUERVAIN);  Surgeon: Marybelle Killings, MD;  Location: [facility];  Service: Orthopedics;  Laterality: Right;   ESOPHAGOGASTRODUODENOSCOPY (EGD) WITH PROPOFOL N/A 09/25/2014   RMR: Normal esophagus. Abnormal gastric mucosa status post biopsy (negative H.pylori). Hiatal hernia.     ESOPHAGOGASTRODUODENOSCOPY (EGD) WITH PROPOFOL N/A 01/16/2020   Normal esophagus s/p dilation, normal stomach and duodenal bulb.    INCISIONAL HERNIA REPAIR N/A 10/04/2013   Procedure: HERNIA REPAIR INCISIONAL WITH MESH;  Surgeon: Jamesetta So, MD;  Location: [facility];  Service: General;  Laterality: N/A;   INSERTION OF MESH N/A 10/04/2013   Procedure: INSERTION OF MESH;  Surgeon: Jamesetta So, MD;  Location: [facility];  Service: General;  Laterality: N/A;   MALONEY DILATION N/A 01/16/2020   Procedure: Keturah Shavers;  Surgeon: Daneil Dolin, MD;  [facility];  Service: Endoscopy;  Laterality: N/A;   POLYPECTOMY N/A 09/25/2014   Procedure: POLYPECTOMY;  Surgeon: Daneil Dolin, MD;  Location: [facility];  Service: Endoscopy;  Laterality: N/A;  Cecal, Ascending Colon    POLYPECTOMY  01/16/2020   Procedure: POLYPECTOMY;  Surgeon: Daneil Dolin, MD;  [facility];  Service: Endoscopy;;   SHOULDER SURGERY Left    TOOTH EXTRACTION  08/09/2019   TOTAL HIP ARTHROPLASTY Right 04/27/2020   Procedure: TOTAL HIP ARTHROPLASTY DIRECT ANTERIOR;  Surgeon: Marybelle Killings, MD;  Location: [facility];  Service: Orthopedics;  Laterality: Right;   TUBAL LIGATION     WISDOM TOOTH EXTRACTION      Family History  Problem Relation Age of Onset   Diabetes Father    Colon polyps Maternal Aunt    Colon cancer Maternal Grandmother    Heart disease Paternal Grandmother    Cancer Paternal Grandmother        esophageal   Hypertension Paternal Grandmother    GER disease Paternal Grandmother    Osteoporosis Paternal Grandmother     Social History   Socioeconomic History   Marital status: Divorced    Spouse name: Not on file   Number of children: Not on file   Years of education: Not on file   Highest education level: Not on file  Occupational History   Not on file  Tobacco Use   Smoking status: Former    Packs/day: 0.50    Years: 27.00    Pack years: 13.50    Types: Cigarettes    Quit date:  08/24/2018    Years since quitting: 3.0   Smokeless tobacco: Never   Tobacco comments:    vape former user  Vaping Use   Vaping Use: Every day   Last attempt to quit: 08/24/2018  Substance and Sexual Activity   Alcohol use: Not Currently    Alcohol/week: 1.0 standard drink    Types: 1 Cans of beer per week    Comment: hx binge drinker. none since 01/23/18   Drug use: Not Currently    Types: Marijuana, Cocaine, Methamphetamines, Heroin    Comment: no cocaine, heroin, meth since 01/23/18. last marijuana use 01-23-18   Sexual activity: Not Currently    Birth control/protection: Surgical    Comment: tubal ligation  Other Topics Concern   Not on file  Social History Narrative   Not on file   Social Determinants of Health   Financial Resource Strain: Not  on file  Food Insecurity: Not on file  Transportation Needs: Not on file  Physical Activity: Not on file  Stress: Not on file  Social Connections: Not on file  Intimate Partner Violence: Not on file    Outpatient Medications Prior to Visit  Medication Sig Dispense Refill   acetaminophen (TYLENOL) 500 MG tablet Take 500 mg by mouth every 6 (six) hours as needed.     albuterol (PROVENTIL HFA) 108 (90 Base) MCG/ACT inhaler Inhale 1-2 puffs into the lungs every 6 (six) hours as needed for wheezing or shortness of breath. 8.5 g 1   blood glucose meter kit and supplies Dispense based on patient and insurance preference. Use up to four times daily as directed. (FOR ICD-10 E10.9, E11.9). 1 each 0   citalopram (CELEXA) 20 MG tablet Take 1 tablet (20 mg total) by mouth in the morning and at bedtime. 60 tablet 3   hydrOXYzine (ATARAX/VISTARIL) 10 MG tablet Take 1 tablet (10 mg total) by mouth 3 (three) times daily as needed. 90 tablet 3   ibuprofen (ADVIL,MOTRIN) 200 MG tablet Take 800 mg by mouth every 8 (eight) hours as needed for fever or moderate pain.      levothyroxine (SYNTHROID) 50 MCG tablet Take 1 tablet (50 mcg total) by mouth daily  before breakfast. 90 tablet 3   metFORMIN (GLUCOPHAGE-XR) 750 MG 24 hr tablet Take 1 tablet (750 mg total) by mouth daily with breakfast. 90 tablet 3   metoprolol tartrate (LOPRESSOR) 50 MG tablet Take 1 tablet (50 mg total) by mouth 2 (two) times daily. 180 tablet 2   omeprazole (PRILOSEC) 40 MG capsule Take 1 capsule (40 mg total) by mouth daily. 30 capsule 2   simethicone (MYLICON) 532 MG chewable tablet Chew 125 mg by mouth every 6 (six) hours as needed for flatulence.     No facility-administered medications prior to visit.    Allergies  Allergen Reactions   Clarithromycin Diarrhea    abd pain   Propoxyphene Itching    ROS Review of Systems    Objective:    Physical Exam Constitutional:      Appearance: She is obese.  HENT:     Head: Normocephalic and atraumatic.  Cardiovascular:     Rate and Rhythm: Normal rate and regular rhythm.     Pulses: Normal pulses.     Heart sounds: Normal heart sounds.  Pulmonary:     Effort: Pulmonary effort is normal.     Breath sounds: Normal breath sounds.  Abdominal:     Palpations: Abdomen is soft.     Comments: Hypoacitve  Musculoskeletal:        General: Normal range of motion.     Cervical back: Normal range of motion.  Skin:    General: Skin is warm and dry.     Capillary Refill: Capillary refill takes less than 2 seconds.  Neurological:     General: No focal deficit present.     Mental Status: She is alert and oriented to person, place, and time.  Psychiatric:        Mood and Affect: Mood normal.        Behavior: Behavior normal.        Thought Content: Thought content normal.        Judgment: Judgment normal.    BP 133/77    Pulse 64    Ht '5\' 4"'  (1.626 m)    Wt 276 lb 12.8 oz (125.6 kg)    SpO2 100%  BMI 47.51 kg/m  Wt Readings from Last 3 Encounters:  09/24/21 276 lb 12.8 oz (125.6 kg)  09/16/21 276 lb 12.8 oz (125.6 kg)  06/09/21 267 lb (121.1 kg)     There are no preventive care reminders to display for  this patient.   There are no preventive care reminders to display for this patient.  Lab Results  Component Value Date   TSH 0.888 03/17/2021   Lab Results  Component Value Date   WBC 9.3 06/07/2021   HGB 11.9 (L) 06/07/2021   HCT 36.6 06/07/2021   MCV 90.8 06/07/2021   PLT 378 06/07/2021   Lab Results  Component Value Date   NA 141 06/07/2021   K 4.2 06/07/2021   CO2 24 06/07/2021   GLUCOSE 83 06/07/2021   BUN 10 06/07/2021   CREATININE 0.49 06/07/2021   BILITOT 0.3 09/16/2021   ALKPHOS 63 09/16/2021   AST 14 09/16/2021   ALT 13 09/16/2021   PROT 7.4 09/16/2021   ALBUMIN 4.3 09/16/2021   CALCIUM 9.6 06/07/2021   ANIONGAP 12 06/07/2021   EGFR 108 03/17/2021   Lab Results  Component Value Date   CHOL 169 03/17/2021   Lab Results  Component Value Date   HDL 35 (L) 03/17/2021   Lab Results  Component Value Date   LDLCALC 89 03/17/2021   Lab Results  Component Value Date   TRIG 269 (H) 03/17/2021   Lab Results  Component Value Date   CHOLHDL 4.8 (H) 03/17/2021   Lab Results  Component Value Date   HGBA1C 5.4 09/24/2021   HGBA1C 5.4 09/24/2021   HGBA1C 5.4 (A) 09/24/2021   HGBA1C 5.4 09/24/2021      Assessment & Plan:   Problem List Items Addressed This Visit       Respiratory   COPD (chronic obstructive pulmonary disease) ([place]) Stable     Digestive   Chronic hepatitis C without hepatic coma (H[facility]) Stable      Endocrine   Hypothyroidism - Primary Stable  Reevaluation pending   Relevant Orders   TSH   T4, free   T3     Other   Bipolar I disorder ([place]) Stable    Prediabetes Consider home glucose monitoring Weight loss at least 5% of current body weight is can be achieved with lifestyle modification dietary changes and regular daily exercise Encourage blood pressure control goal <120/80 and maintaining total cholesterol <200 Follow-up every 3 to 6 months for reevaluation Education material provided    Relevant Orders   HgB A1c  (Completed)   Other Visit Diagnoses     Class 3 severe obesity due to excess calories with serious comorbidity and body mass index (BMI) of 45.0 to 49.9 in adult ([place])     Obesity with BMI and comorbidities as noted above.  Discussed proper diet (low fat, low sodium, high fiber) with patient.   Discussed need for regular exercise (3 times per week, 20 minutes per session) with patient.    Relevant Orders   Comp. Metabolic Panel (12)   Amb Ref to Medical Weight Management       No orders of the defined types were placed in this encounter.   Follow-up: Return in about 3 months (around 12/23/2021) for follow up [id number].    Vevelyn Francois, NP

## 2021-09-25 LAB — SPECIMEN STATUS

## 2021-09-30 ENCOUNTER — Other Ambulatory Visit: Payer: Self-pay

## 2021-09-30 ENCOUNTER — Other Ambulatory Visit: Payer: Self-pay | Admitting: Internal Medicine

## 2021-09-30 ENCOUNTER — Ambulatory Visit (HOSPITAL_COMMUNITY)
Admission: RE | Admit: 2021-09-30 | Discharge: 2021-09-30 | Disposition: A | Discharge status: Home / Self Care | Payer: Self-pay | Source: Ambulatory Visit | Attending: Internal Medicine | Admitting: Internal Medicine

## 2021-09-30 DIAGNOSIS — K838 Other specified diseases of biliary tract: Secondary | ICD-10-CM

## 2021-09-30 DIAGNOSIS — K760 Fatty (change of) liver, not elsewhere classified: Secondary | ICD-10-CM | POA: Insufficient documentation

## 2021-09-30 MED ORDER — GADOBUTROL 1 MMOL/ML IV SOLN
10.0000 mL | Freq: Once | INTRAVENOUS | Status: AC | PRN
  Administered 2021-09-30: 10 mL via INTRAVENOUS

## 2021-10-02 LAB — COMP. METABOLIC PANEL (12)
AST: 15 IU/L (ref 0–40)
Albumin/Globulin Ratio: 2.1 (ref 1.2–2.2)
Albumin: 4.5 g/dL (ref 3.8–4.8)
Alkaline Phosphatase: 68 IU/L (ref 44–121)
BUN/Creatinine Ratio: 18 (ref 9–23)
BUN: 13 mg/dL (ref 6–24)
Bilirubin Total: <0.2 mg/dL (ref 0.0–1.2)
Calcium: 9.5 mg/dL (ref 8.7–10.2)
Chloride: 104 mmol/L (ref 96–106)
Creatinine, Ser: 0.71 mg/dL (ref 0.57–1.00)
Globulin, Total: 2.1 g/dL (ref 1.5–4.5)
Glucose: 96 mg/dL (ref 70–99)
Potassium: 4.5 mmol/L (ref 3.5–5.2)
Sodium: 138 mmol/L (ref 134–144)
Total Protein: 6.6 g/dL (ref 6.0–8.5)
eGFR: 106 mL/min/{1.73_m2} (ref >59)

## 2021-10-02 LAB — TSH: TSH: 1.08 u[IU]/mL (ref 0.450–4.500)

## 2021-10-02 LAB — T4, FREE: Free T4: 1.01 ng/dL (ref 0.82–1.77)

## 2021-10-02 LAB — T3: T3, Total: 109 ng/dL (ref 71–180)

## 2021-10-05 ENCOUNTER — Other Ambulatory Visit: Payer: Self-pay

## 2021-10-05 ENCOUNTER — Encounter: Payer: Self-pay | Admitting: Internal Medicine

## 2021-10-05 DIAGNOSIS — R197 Diarrhea, unspecified: Secondary | ICD-10-CM

## 2021-10-07 ENCOUNTER — Ambulatory Visit (HOSPITAL_COMMUNITY): Payer: No Payment, Other | Admitting: Licensed Clinical Social Worker

## 2021-10-07 ENCOUNTER — Encounter (HOSPITAL_COMMUNITY): Payer: Self-pay

## 2021-10-14 ENCOUNTER — Telehealth (INDEPENDENT_AMBULATORY_CARE_PROVIDER_SITE_OTHER): Payer: No Payment, Other | Admitting: Psychiatry

## 2021-10-14 ENCOUNTER — Other Ambulatory Visit: Payer: Self-pay

## 2021-10-14 ENCOUNTER — Encounter (HOSPITAL_COMMUNITY): Payer: Self-pay | Admitting: Psychiatry

## 2021-10-14 DIAGNOSIS — F319 Bipolar disorder, unspecified: Secondary | ICD-10-CM | POA: Diagnosis not present

## 2021-10-14 DIAGNOSIS — F419 Anxiety disorder, unspecified: Secondary | ICD-10-CM

## 2021-10-14 MED ORDER — HYDROXYZINE HCL 10 MG PO TABS
10.0000 mg | ORAL_TABLET | Freq: Three times a day (TID) | ORAL | 3 refills | Status: DC | PRN
  Filled 2021-10-14 – 2021-11-16 (×2): qty 90, 30d supply, fill #0
  Filled 2021-12-27 – 2021-12-28 (×2): qty 90, 30d supply, fill #1

## 2021-10-14 MED ORDER — CITALOPRAM HYDROBROMIDE 20 MG PO TABS
20.0000 mg | ORAL_TABLET | Freq: Two times a day (BID) | ORAL | 3 refills | Status: DC
  Filled 2021-10-14 – 2021-10-24 (×2): qty 60, 30d supply, fill #0
  Filled 2021-11-23: qty 60, 30d supply, fill #1

## 2021-10-14 NOTE — Progress Notes (Signed)
[place] MD/PA/NP OP Progress Note Virtual Visit via Video Note  I connected with Deborah Barnes on 10/14/21 at  2:30 PM EST by a video enabled telemedicine application and verified that I am speaking with the correct person using two identifiers.  Location: Patient: Home Provider: Clinic   I discussed the limitations of evaluation and management by telemedicine and the availability of in person appointments. The patient expressed understanding and agreed to proceed.  I provided 30 minutes of non-face-to-face time during this encounter.   10/14/2021 2:56 PM Deborah Barnes  MRN:  [id number]  Chief Complaint: "I have been having a lot of bad dreams"  HPI:  46 year old female seen today for follow-up psychiatric evaluation.  She has a psychiatric history of bipolar 1 disorder, anxiety, and substance use (alcohol, heroin, marijuana, meth, cocaine, pain pills, notes that she misused gabapentin-Remission 4 years).  Currently she managed Celexa 20 mg twice daily (total 40 mg) and hydroxyzine 10 mg 3 times daily as needed.  She reports her medications are effective in managing her psychiatric conditions.  Today she she is pleasant, cooperative, engaged in conversation, and maintained eye contact.  She notes that  she has been having a lot of bad dreams about violence. She however reports that she is able to cope with it as she feels it is a spiritual attack. She notes that she prefers to deal with her dreams with prayed and with her sponsors help.  Patient reports that she has maintained her sobriety for 4 years.  She informed Probation officer that she has completed step 4 and 5 in her recovery process and will be transitioning step 6 and 7.  Patient also notes that she is proud of her son for completing 6 months of sobriety.  Patient continues to work at [employer].  She notes that she left her position at [employer] and has now started a another position at [employer].   Patient reports that she has been  having some panic attacks in public.  She notes that she is able to take hydroxyzine, deep breath, and pray to help cope with it.  Despite her stressors patient reports that she is doing well mentally.  Provider conducted a GAD-7 and patient scored a 10, at her last visit she scored a 9.  Provider also conducted PHQ-9 and patient scored a 7, at her last visit she scored a 6.  She endorses adequate sleep and appetite.  Today she denies SI/HI/VAH, mania, or paranoia.   At this time no medication changes made today.  Patient agreeable to continue medications as prescribed.  No other concerns at this time.   Visit Diagnosis:    ICD-10-CM   1. Anxiety  F41.9 hydrOXYzine (ATARAX) 10 MG tablet    citalopram (CELEXA) 20 MG tablet    2. Bipolar I disorder (HCC)  F31.9 citalopram (CELEXA) 20 MG tablet      Past Psychiatric History: bipolar 1 disorder, anxiety, and substance use (alcohol, heroin, marijuana, meth, cocaine, pain pills, notes that she misused gabapentin)  Past Medical History:  Past Medical History:  Diagnosis Date   Asthma    Back pain    Bipolar disorder ([place])    DX in the past but not currently be treated   Bulging disc    Complication of anesthesia 2014   anesthesia ? caused depression/suicidal ideation per pt.   COPD (chronic obstructive pulmonary disease) ([place])    De Quervain's tenosynovitis, right 10/2019   surgery  Degenerative disc disease    Depression    Depression with anxiety    DJD (degenerative joint disease)    right hip   GERD (gastroesophageal reflux disease)    Hepatitis C 08/2017   TX for HEP C   Hip pain, right    History of alcohol abuse    History of IBS    History of mixed drug abuse ([place])    Hypercholesterolemia    Hypertension    Hypoglycemia    Hypothyroidism    PTSD (post-traumatic stress disorder)    Sciatica    Shortness of breath    Thyroid disease    Vitamin D deficiency 02/2020   Walker as ambulation aid     Past Surgical  History:  Procedure Laterality Date   BIOPSY N/A 09/25/2014   Procedure: BIOPSY;  Surgeon: Daneil Dolin, MD;  Location: [facility];  Service: Endoscopy;  Laterality: N/A;  Gastric, Ascending Colon, Descending/Sigmoid Colon    CARPAL TUNNEL RELEASE Bilateral    CHOLECYSTECTOMY N/A 04/26/2013   Procedure: LAPAROSCOPIC CHOLECYSTECTOMY;  Surgeon: Jamesetta So, MD;  Location: [facility];  Service: General;  Laterality: N/A;   COLONOSCOPY WITH PROPOFOL N/A 09/25/2014   RMR: Pancolonic diverticulosis. multiple tubular adenomas, segmental biopsies negative. surveillance in 5 years   COLONOSCOPY WITH PROPOFOL N/A 01/16/2020   diverticulosis, two 4-6 mm polyps in sigmoid and splenic flexure, one 10 mm polyp in sigmoid Tubular adenomas, 3 year surveillance.   DORSAL COMPARTMENT RELEASE Right 10/30/2019   Procedure: RIGHT WRIST 1ST  DORSAL COMPARTMENT RELEASE (DEQUERVAIN);  Surgeon: Marybelle Killings, MD;  Location: [facility];  Service: Orthopedics;  Laterality: Right;   ESOPHAGOGASTRODUODENOSCOPY (EGD) WITH PROPOFOL N/A 09/25/2014   RMR: Normal esophagus. Abnormal gastric mucosa status post biopsy (negative H.pylori). Hiatal hernia.    ESOPHAGOGASTRODUODENOSCOPY (EGD) WITH PROPOFOL N/A 01/16/2020   Normal esophagus s/p dilation, normal stomach and duodenal bulb.    INCISIONAL HERNIA REPAIR N/A 10/04/2013   Procedure: HERNIA REPAIR INCISIONAL WITH MESH;  Surgeon: Jamesetta So, MD;  Location: [facility];  Service: General;  Laterality: N/A;   INSERTION OF MESH N/A 10/04/2013   Procedure: INSERTION OF MESH;  Surgeon: Jamesetta So, MD;  Location: [facility];  Service: General;  Laterality: N/A;   MALONEY DILATION N/A 01/16/2020   Procedure: Keturah Shavers;  Surgeon: Daneil Dolin, MD;  [facility];  Service: Endoscopy;  Laterality: N/A;   POLYPECTOMY N/A 09/25/2014   Procedure: POLYPECTOMY;  Surgeon: Daneil Dolin, MD;  Location: [facility];  Service: Endoscopy;  Laterality: N/A;  Cecal, Ascending Colon    POLYPECTOMY   01/16/2020   Procedure: POLYPECTOMY;  Surgeon: Daneil Dolin, MD;  [facility];  Service: Endoscopy;;   SHOULDER SURGERY Left    TOOTH EXTRACTION  08/09/2019   TOTAL HIP ARTHROPLASTY Right 04/27/2020   Procedure: TOTAL HIP ARTHROPLASTY DIRECT ANTERIOR;  Surgeon: Marybelle Killings, MD;  Location: [facility];  Service: Orthopedics;  Laterality: Right;   TUBAL LIGATION     WISDOM TOOTH EXTRACTION      Family Psychiatric History:  55 year old son mental health conditions  Family History:  Family History  Problem Relation Age of Onset   Diabetes Father    Colon polyps Maternal Aunt    Colon cancer Maternal Grandmother    Heart disease Paternal Grandmother    Cancer Paternal Grandmother        esophageal   Hypertension Paternal Grandmother    GER  disease Paternal Grandmother   ° Osteoporosis Paternal Grandmother   ° ° °Social History:  °Social History  ° °Socioeconomic History  ° Marital status: Divorced  °  Spouse name: Not on file  ° Number of children: Not on file  ° Years of education: Not on file  ° Highest education level: Not on file  °Occupational History  ° Not on file  °Tobacco Use  ° Smoking status: Former  °  Packs/day: 0.50  °  Years: 27.00  °  Pack years: 13.50  °  Types: Cigarettes  °  Quit date: 08/24/2018  °  Years since quitting: 3.1  ° Smokeless tobacco: Never  ° Tobacco comments:  °  vape former user  °Vaping Use  ° Vaping Use: Every day  ° Last attempt to quit: 08/24/2018  °Substance and Sexual Activity  ° Alcohol use: Not Currently  °  Alcohol/week: 1.0 standard drink  °  Types: 1 Cans of beer per week  °  Comment: hx binge drinker. none since 01/23/18  ° Drug use: Not Currently  °  Types: Marijuana, Cocaine, Methamphetamines, Heroin  °  Comment: no cocaine, heroin, meth since 01/23/18. last marijuana use 01-23-18  ° Sexual activity: Not Currently  °  Birth control/protection: Surgical  °  Comment: tubal ligation  °Other Topics Concern  ° Not on file  °Social History Narrative  ° Not  on file  ° °Social Determinants of Health  ° °Financial Resource Strain: Not on file  °Food Insecurity: Not on file  °Transportation Needs: Not on file  °Physical Activity: Not on file  °Stress: Not on file  °Social Connections: Not on file  ° ° °Allergies:  °Allergies  °Allergen Reactions  ° Clarithromycin Diarrhea  °  abd pain  ° Propoxyphene Itching  ° ° °Metabolic Disorder Labs: °Lab Results  °Component Value Date  ° HGBA1C 5.4 09/24/2021  ° HGBA1C 5.4 09/24/2021  ° HGBA1C 5.4 (A) 09/24/2021  ° HGBA1C 5.4 09/24/2021  ° MPG 100 09/05/2017  ° °No results found for: PROLACTIN °Lab Results  °Component Value Date  ° CHOL 169 03/17/2021  ° TRIG 269 (H) 03/17/2021  ° HDL 35 (L) 03/17/2021  ° CHOLHDL 4.8 (H) 03/17/2021  ° VLDL 27 09/05/2017  ° LDLCALC 89 03/17/2021  ° LDLCALC 90 03/13/2020  ° °Lab Results  °Component Value Date  ° TSH 1.080 09/24/2021  ° TSH 0.888 03/17/2021  ° ° °Therapeutic Level Labs: °No results found for: LITHIUM °No results found for: VALPROATE °No components found for:  CBMZ ° °Current Medications: °Current Outpatient Medications  °Medication Sig Dispense Refill  ° acetaminophen (TYLENOL) 500 MG tablet Take 500 mg by mouth every 6 (six) hours as needed.    ° albuterol (PROVENTIL HFA) 108 (90 Base) MCG/ACT inhaler Inhale 1-2 puffs into the lungs every 6 (six) hours as needed for wheezing or shortness of breath. 8.5 g 1  ° blood glucose meter kit and supplies Dispense based on patient and insurance preference. Use up to four times daily as directed. (FOR ICD-10 E10.9, E11.9). 1 each 0  ° citalopram (CELEXA) 20 MG tablet Take 1 tablet (20 mg total) by mouth in the morning and at bedtime. 60 tablet 3  ° hydrOXYzine (ATARAX) 10 MG tablet Take 1 tablet (10 mg total) by mouth 3 (three) times daily as needed. 90 tablet 3  ° ibuprofen (ADVIL,MOTRIN) 200 MG tablet Take 800 mg by mouth every 8 (eight) hours as needed for fever or moderate pain.     °   levothyroxine (SYNTHROID) 50 MCG tablet Take 1 tablet (50  mcg total) by mouth daily before breakfast. 90 tablet 3  ° metFORMIN (GLUCOPHAGE-XR) 750 MG 24 hr tablet Take 1 tablet (750 mg total) by mouth daily with breakfast. 90 tablet 3  ° metoprolol tartrate (LOPRESSOR) 50 MG tablet Take 1 tablet (50 mg total) by mouth 2 (two) times daily. 180 tablet 2  ° omeprazole (PRILOSEC) 40 MG capsule Take 1 capsule (40 mg total) by mouth daily. 30 capsule 2  ° simethicone (MYLICON) 125 MG chewable tablet Chew 125 mg by mouth every 6 (six) hours as needed for flatulence.    ° °No current facility-administered medications for this visit.  ° ° ° °Musculoskeletal: °Strength & Muscle Tone:  Unable to assess due to telehealth °Gait & Station:  Unable to assess due to telehealth visit °Patient leans: N/A ° °Psychiatric Specialty Exam: °Review of Systems  °There were no vitals taken for this visit.There is no height or weight on file to calculate BMI.  °General Appearance: Well Groomed  °Eye Contact:  Good  °Speech:  Clear and Coherent and Normal Rate  °Volume:  Normal  °Mood:  Euthymic  °Affect:  Appropriate and Congruent  °Thought Process:  Coherent, Goal Directed, and Linear  °Orientation:  Full (Time, Place, and Person)  °Thought Content: WDL and Logical   °Suicidal Thoughts:  No  °Homicidal Thoughts:  No  °Memory:  Immediate;   Good °Recent;   Good °Remote;   Good  °Judgement:  Good  °Insight:  Good  °Psychomotor Activity:  Normal  °Concentration:  Concentration: Good and Attention Span: Good  °Recall:  Good  °Fund of Knowledge: Good  °Language: Good  °Akathisia:  No  °Handed:  Right  °AIMS (if indicated): not done  °Assets:  Communication Skills °Desire for Improvement °Financial Resources/Insurance °Housing °Leisure Time °Social Support  °ADL's:  Intact  °Cognition: WNL  °Sleep:  Good  ° °Screenings: °AIMS   ° °Flowsheet Row [facility]  °AIMS Total Score 0  ° °  ° °AUDIT   ° °Flowsheet Row Ad[facility]  [facility]  °Alcohol Use Disorder Identification Test Final Score (AUDIT) 7  ° °  ° °GAD-7   ° °Flowsheet Row Video Visit from 10/14/2021 in [facility] Video Visit from 07/20/2021 in [facility] Office Visit from 04/14/2021 in [facility]  °Total GAD-7 Score 10 9 15  ° °  ° °PHQ2-9   ° °Flowsheet Row Video Visit from 10/14/2021 in [facility] Office Visit from 09/24/2021 in [facility] Video Visit from 07/20/2021 in [facility] Office Visit from 04/14/2021 in [facility] Counselor from 04/07/2021 in [facility]  °PHQ-2 Total Score 0 1 0 3 3  °PHQ-9 Total Score 7 8 6 14 14  ° °  ° °Flowsheet Row ED from 06/07/2021 in [facility] Office Visit from 04/14/2021 in [facility] Counselor from 04/07/2021 in [facility]  °C-SSRS RISK CATEGORY No Risk No Risk Low Risk  ° °  ° ° ° °Assessment and Plan: Patient notes that she is doing well on her current medication regimen.  No medication changes made today.  Patient agreeable to continue medication as prescribed. ° °1. Anxiety ° °Continue- hydrOXYzine (  ATARAX/VISTARIL) 10 MG tablet; Take 1 tablet (10 mg total) by mouth 3 (three) times daily as needed.  Dispense: 90 tablet; Refill: 3 °Continue- citalopram (CELEXA) 20 MG tablet; Take 1 tablet (40 mg total) by mouth in the morning and at bedtime.  Dispense: 60 tablet; Refill: 3 ° °2. Bipolar I disorder (HCC) ° °Continue- citalopram (CELEXA) 20 MG tablet; Take 1 tablet (40 mg total) by mouth in the morning and at bedtime.  Dispense: 60 tablet; Refill: 3 ° °Follow-up in 3 °Follow-up with therapy ° °Brittney E Parsons, NP °10/14/2021, 2:56 PM ° °

## 2021-10-25 ENCOUNTER — Other Ambulatory Visit: Payer: Self-pay

## 2021-11-01 ENCOUNTER — Other Ambulatory Visit: Payer: Self-pay

## 2021-11-08 ENCOUNTER — Other Ambulatory Visit: Payer: Self-pay

## 2021-11-17 ENCOUNTER — Other Ambulatory Visit: Payer: Self-pay

## 2021-11-23 ENCOUNTER — Other Ambulatory Visit: Payer: Self-pay | Admitting: Nurse Practitioner

## 2021-11-23 ENCOUNTER — Other Ambulatory Visit (HOSPITAL_BASED_OUTPATIENT_CLINIC_OR_DEPARTMENT_OTHER): Payer: Self-pay

## 2021-11-23 ENCOUNTER — Other Ambulatory Visit: Payer: Self-pay

## 2021-11-23 DIAGNOSIS — I10 Essential (primary) hypertension: Secondary | ICD-10-CM

## 2021-11-23 MED ORDER — METOPROLOL TARTRATE 50 MG PO TABS
50.0000 mg | ORAL_TABLET | Freq: Two times a day (BID) | ORAL | 0 refills | Status: DC
  Filled 2021-11-23: qty 60, 30d supply, fill #0

## 2021-11-24 ENCOUNTER — Other Ambulatory Visit: Payer: Self-pay

## 2021-11-24 MED ORDER — AMOXICILLIN 500 MG PO CAPS
500.0000 mg | ORAL_CAPSULE | Freq: Three times a day (TID) | ORAL | 0 refills | Status: DC
  Filled 2021-11-24: qty 30, 10d supply, fill #0

## 2021-11-25 ENCOUNTER — Ambulatory Visit (INDEPENDENT_AMBULATORY_CARE_PROVIDER_SITE_OTHER): Payer: No Payment, Other | Admitting: Licensed Clinical Social Worker

## 2021-11-25 DIAGNOSIS — F330 Major depressive disorder, recurrent, mild: Secondary | ICD-10-CM

## 2021-11-26 ENCOUNTER — Other Ambulatory Visit: Payer: Self-pay

## 2021-12-01 ENCOUNTER — Ambulatory Visit: Payer: Medicaid Other | Admitting: Gastroenterology

## 2021-12-02 ENCOUNTER — Ambulatory Visit: Payer: Medicaid Other | Admitting: Nurse Practitioner

## 2021-12-06 NOTE — Progress Notes (Signed)
? ?  THERAPIST PROGRESS NOTE ? ? ?Virtual Visit via Video Note ? ?I connected with Deborah Barnes on 11/25/21 at  1:00 PM EST by a video enabled telemedicine application and verified that I am speaking with the correct person using two identifiers. ? ?Location: ?Patient: Home ?Provider: [facility] ?  ?I discussed the limitations of evaluation and management by telemedicine and the availability of in person appointments. The patient expressed understanding and agreed to proceed. ?I discussed the assessment and treatment plan with the patient. The patient was provided an opportunity to ask questions and all were answered. The patient agreed with the plan and demonstrated an understanding of the instructions. ?  ?The patient was advised to call back or seek an in-person evaluation if the symptoms worsen or if the condition fails to improve as anticipated. ? ?I provided 24 minutes of non-face-to-face time during this encounter. ? ?Participation Level: Minimal ? ?Behavioral Response: CasualLethargicDepressed ? ?Type of Therapy: Individual Therapy ? ?Treatment Goals addressed: dep/stressors/coping ? ?ProgressTowards Goals: Progressing ? ?Interventions: Supportive ? ?Summary: Deborah Barnes is a 46 y.o. female who presents with hx of MDD, past addiction problem.  Today patient logs on for video session for the first time.  Deborah Barnes states she is not doing well because she has a dental abscess.  She reports she is on antibiotics and will have to have her tooth pulled next week.  She states her mouth is hurting and she has a headache.  Deborah Barnes states she is taking mental health meds as prescribed.  She reports stressors in the [place] related to a revolving door of new people who relapse and leave. Pt remains clean.  Deborah Barnes reports she is going to step back on some of her duties at the house.  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  She reports with her job at [employer]  and new job at [employer] she has too much to juggle.  LCSW assessed for outcome of GI testing.  She advises all of her tests were okay.  She reports she has been informed she has an inflamed bile duct, has ultrasound pending.  Today's session kept at a minimum due to patient feeling poorly.  LCSW did inform Deborah Barnes of this clinician's resignation from [facility] prior to close of session.  Patient will have a termination session on December 09, 2021.  Patient verbalizes understanding. ? ?Suicidal/Homicidal: Nowithout intent/plan ? ?Therapist Response: Pt receptive to care. ? ?Plan: Return again in 3 weeks. ? ?Diagnosis: MDD (major depressive disorder), recurrent episode, mild ([place]) ? ?Collaboration of Care: Other None deemed necessary this session. ? ?Patient/Guardian was advised Release of Information must be obtained prior to any record release in order to collaborate their care with an outside provider. Patient/Guardian was advised if they have not already done so to contact the registration department to sign all necessary forms in order for [place] to release information regarding their care.  ? ?Consent: Patient/Guardian gives verbal consent for treatment and assignment of benefits for services provided during this visit. Patient/Guardian expressed understanding and agreed to proceed.  ? ?Hermine Messick, LCSW ?11/25/2021 late entry ? ?

## 2021-12-08 ENCOUNTER — Other Ambulatory Visit: Payer: Self-pay

## 2021-12-08 ENCOUNTER — Encounter: Payer: Self-pay | Admitting: Nurse Practitioner

## 2021-12-08 ENCOUNTER — Ambulatory Visit (INDEPENDENT_AMBULATORY_CARE_PROVIDER_SITE_OTHER): Payer: Self-pay | Admitting: Nurse Practitioner

## 2021-12-08 VITALS — BP 137/79 | HR 67 | Temp 98.3°F | Ht 64.0 in | Wt 284.4 lb

## 2021-12-08 DIAGNOSIS — Z6841 Body Mass Index (BMI) 40.0 and over, adult: Secondary | ICD-10-CM

## 2021-12-08 DIAGNOSIS — D649 Anemia, unspecified: Secondary | ICD-10-CM

## 2021-12-08 DIAGNOSIS — E039 Hypothyroidism, unspecified: Secondary | ICD-10-CM

## 2021-12-08 DIAGNOSIS — R7303 Prediabetes: Secondary | ICD-10-CM

## 2021-12-08 NOTE — Progress Notes (Signed)
? ?[facility], [place]  [address] ?Phone:  [phone]   Fax:  [phone] ? ? ?Established Patient Office Visit ? ?Subjective:  ?Patient ID: Deborah Barnes, female    DOB: 1975/11/27  Age: 46 y.o. MRN: [id number] ? ?CC:  ?Chief Complaint  ?Patient presents with  ? Follow-up  ?  Pt is here for 3 month follow up. Pt would like to up her dosage on metformin pt stated she hasn't notice any weight loss.  ? ? ?HPI ?Deborah Barnes presents for follow up. She  has a past medical history of Asthma, Back pain, Bipolar disorder ([place]), Bulging disc, Complication of anesthesia (2014), COPD (chronic obstructive pulmonary disease) ([place]), De Quervain's tenosynovitis, right (10/2019), Degenerative disc disease, Depression, Depression with anxiety, DJD (degenerative joint disease), GERD (gastroesophageal reflux disease), Hepatitis C (08/2017), Hip pain, right, History of alcohol abuse, History of IBS, History of mixed drug abuse ([place]), Hypercholesterolemia, Hypertension, Hypoglycemia, Hypothyroidism, PTSD (post-traumatic stress disorder), Sciatica, Shortness of breath, Thyroid disease, Vitamin D deficiency (02/2020), and Walker as ambulation aid.  ? ?Hypothyroidism ?Deborah Barnes is a 46 y.o. female who presents for follow up of hypothyroidism. Current symptoms: weight changes and she was feeling sick  . Patient denies diarrhea, heat / cold intolerance, and palpitations. Symptoms have stabilized. She had requested to establish with Endo. The referral was declined due to Endo feeling like her labs are stable and no specific changes would be made to current treatment.  ? ?She is working on boundaries with other while she engages in more self care.  She is concern about her weight gain.  ?Past Medical History:  ?Diagnosis Date  ? Asthma   ? Back pain   ? Bipolar disorder ([place])   ? DX in the past but not currently be treated  ? Bulging disc   ? Complication of anesthesia 2014  ?  anesthesia ? caused depression/suicidal ideation per pt.  ? COPD (chronic obstructive pulmonary disease) ([place])   ? De Quervain's tenosynovitis, right 10/2019  ? surgery  ? Degenerative disc disease   ? Depression   ? Depression with anxiety   ? DJD (degenerative joint disease)   ? right hip  ? GERD (gastroesophageal reflux disease)   ? Hepatitis C 08/2017  ? TX for HEP C  ? Hip pain, right   ? History of alcohol abuse   ? History of IBS   ? History of mixed drug abuse ([place])   ? Hypercholesterolemia   ? Hypertension   ? Hypoglycemia   ? Hypothyroidism   ? PTSD (post-traumatic stress disorder)   ? Sciatica   ? Shortness of breath   ? Thyroid disease   ? Vitamin D deficiency 02/2020  ? Walker as ambulation aid   ? ? ?Past Surgical History:  ?Procedure Laterality Date  ? BIOPSY N/A 09/25/2014  ? Procedure: BIOPSY;  Surgeon: Daneil Dolin, MD;  Location: [facility];  Service: Endoscopy;  Laterality: N/A;  Gastric, Ascending Colon, Descending/Sigmoid Colon   ? CARPAL TUNNEL RELEASE Bilateral   ? CHOLECYSTECTOMY N/A 04/26/2013  ? Procedure: LAPAROSCOPIC CHOLECYSTECTOMY;  Surgeon: Jamesetta So, MD;  Location: [facility];  Service: General;  Laterality: N/A;  ? COLONOSCOPY WITH PROPOFOL N/A 09/25/2014  ? RMR: Pancolonic diverticulosis. multiple tubular adenomas, segmental biopsies negative. surveillance in 5 years  ? COLONOSCOPY WITH PROPOFOL N/A 01/16/2020  ? diverticulosis, two 4-6 mm polyps in sigmoid and splenic flexure, one 10 mm polyp  in sigmoid Tubular adenomas, 3 year surveillance.  ? DORSAL COMPARTMENT RELEASE Right 10/30/2019  ? Procedure: RIGHT WRIST 1ST  DORSAL COMPARTMENT RELEASE (DEQUERVAIN);  Surgeon: Marybelle Killings, MD;  Location: [facility];  Service: Orthopedics;  Laterality: Right;  ? ESOPHAGOGASTRODUODENOSCOPY (EGD) WITH PROPOFOL N/A 09/25/2014  ? RMR: Normal esophagus. Abnormal gastric mucosa status post biopsy (negative H.pylori). Hiatal hernia.   ? ESOPHAGOGASTRODUODENOSCOPY (EGD) WITH PROPOFOL N/A 01/16/2020  ? Normal  esophagus s/p dilation, normal stomach and duodenal bulb.   ? INCISIONAL HERNIA REPAIR N/A 10/04/2013  ? Procedure: HERNIA REPAIR INCISIONAL WITH MESH;  Surgeon: Jamesetta So, MD;  Location: [facility];  Service: General;  Laterality: N/A;  ? INSERTION OF MESH N/A 10/04/2013  ? Procedure: INSERTION OF MESH;  Surgeon: Jamesetta So, MD;  Location: [facility];  Service: General;  Laterality: N/A;  ? MALONEY DILATION N/A 01/16/2020  ? Procedure: MALONEY DILATION;  Surgeon: Daneil Dolin, MD;  [facility];  Service: Endoscopy;  Laterality: N/A;  ? POLYPECTOMY N/A 09/25/2014  ? Procedure: POLYPECTOMY;  Surgeon: Daneil Dolin, MD;  Location: [facility];  Service: Endoscopy;  Laterality: N/A;  Cecal, Ascending Colon   ? POLYPECTOMY  01/16/2020  ? Procedure: POLYPECTOMY;  Surgeon: Daneil Dolin, MD;  [facility];  Service: Endoscopy;;  ? SHOULDER SURGERY Left   ? TOOTH EXTRACTION  08/09/2019  ? TOTAL HIP ARTHROPLASTY Right 04/27/2020  ? Procedure: TOTAL HIP ARTHROPLASTY DIRECT ANTERIOR;  Surgeon: Marybelle Killings, MD;  Location: [facility];  Service: Orthopedics;  Laterality: Right;  ? TUBAL LIGATION    ? WISDOM TOOTH EXTRACTION    ? ? ?Family History  ?Problem Relation Age of Onset  ? Diabetes Father   ? Colon polyps Maternal Aunt   ? Colon cancer Maternal Grandmother   ? Heart disease Paternal Grandmother   ? Cancer Paternal Grandmother   ?     esophageal  ? Hypertension Paternal Grandmother   ? GER disease Paternal Grandmother   ? Osteoporosis Paternal Grandmother   ? ? ?Social History  ? ?Socioeconomic History  ? Marital status: Divorced  ?  Spouse name: Not on file  ? Number of children: Not on file  ? Years of education: Not on file  ? Highest education level: Not on file  ?Occupational History  ? Not on file  ?Tobacco Use  ? Smoking status: Former  ?  Packs/day: 0.50  ?  Years: 27.00  ?  Pack years: 13.50  ?  Types: Cigarettes  ?  Quit date: 08/24/2018  ?  Years since quitting: 3.3  ? Smokeless tobacco: Never  ?  Tobacco comments:  ?  vape former user  ?Vaping Use  ? Vaping Use: Every day  ? Last attempt to quit: 08/24/2018  ?Substance and Sexual Activity  ? Alcohol use: Not Currently  ?  Alcohol/week: 1.0 standard drink  ?  Types: 1 Cans of beer per week  ?  Comment: hx binge drinker. none since 01/23/18  ? Drug use: Not Currently  ?  Types: Marijuana, Cocaine, Methamphetamines, Heroin  ?  Comment: no cocaine, heroin, meth since 01/23/18. last marijuana use 01-23-18  ? Sexual activity: Not Currently  ?  Birth control/protection: Surgical  ?  Comment: tubal ligation  ?Other Topics Concern  ? Not on file  ?Social History Narrative  ? Not on file  ? ?Social Determinants of Health  ? ?Financial Resource Strain: Not on file  ?Food Insecurity: Not  on file  ?Transportation Needs: Not on file  ?Physical Activity: Not on file  ?Stress: Not on file  ?Social Connections: Not on file  ?Intimate Partner Violence: Not on file  ? ? ?Outpatient Medications Prior to Visit  ?Medication Sig Dispense Refill  ? acetaminophen (TYLENOL) 500 MG tablet Take 500 mg by mouth every 6 (six) hours as needed.    ? albuterol (PROVENTIL HFA) 108 (90 Base) MCG/ACT inhaler Inhale 1-2 puffs into the lungs every 6 (six) hours as needed for wheezing or shortness of breath. 8.5 g 1  ? blood glucose meter kit and supplies Dispense based on patient and insurance preference. Use up to four times daily as directed. (FOR ICD-10 E10.9, E11.9). 1 each 0  ? hydrOXYzine (ATARAX) 10 MG tablet Take 1 tablet (10 mg total) by mouth 3 (three) times daily as needed. 90 tablet 3  ? ibuprofen (ADVIL,MOTRIN) 200 MG tablet Take 800 mg by mouth every 8 (eight) hours as needed for fever or moderate pain.     ? levothyroxine (SYNTHROID) 50 MCG tablet Take 1 tablet (50 mcg total) by mouth daily before breakfast. 90 tablet 3  ? metFORMIN (GLUCOPHAGE-XR) 750 MG 24 hr tablet Take 1 tablet (750 mg total) by mouth daily with breakfast. 90 tablet 3  ? metoprolol tartrate (LOPRESSOR) 50 MG tablet  Take 1 tablet (50 mg total) by mouth 2 (two) times daily. 60 tablet 0  ? omeprazole (PRILOSEC) 40 MG capsule Take 1 capsule (40 mg total) by mouth daily. 30 capsule 2  ? simethicone (MYLICON) 373 MG chewable ta

## 2021-12-08 NOTE — Patient Instructions (Signed)

## 2021-12-09 ENCOUNTER — Ambulatory Visit (INDEPENDENT_AMBULATORY_CARE_PROVIDER_SITE_OTHER): Payer: No Payment, Other | Admitting: Licensed Clinical Social Worker

## 2021-12-09 DIAGNOSIS — F3341 Major depressive disorder, recurrent, in partial remission: Secondary | ICD-10-CM | POA: Diagnosis not present

## 2021-12-09 LAB — CBC WITH DIFFERENTIAL/PLATELET
Basophils Absolute: 0 10*3/uL (ref 0.0–0.2)
Basos: 0 %
EOS (ABSOLUTE): 0.3 10*3/uL (ref 0.0–0.4)
Eos: 3 %
Hematocrit: 36.8 % (ref 34.0–46.6)
Hemoglobin: 12.2 g/dL (ref 11.1–15.9)
Immature Grans (Abs): 0 10*3/uL (ref 0.0–0.1)
Immature Granulocytes: 0 %
Lymphocytes Absolute: 2.1 10*3/uL (ref 0.7–3.1)
Lymphs: 23 %
MCH: 29.6 pg (ref 26.6–33.0)
MCHC: 33.2 g/dL (ref 31.5–35.7)
MCV: 89 fL (ref 79–97)
Monocytes Absolute: 0.5 10*3/uL (ref 0.1–0.9)
Monocytes: 5 %
Neutrophils Absolute: 6.4 10*3/uL (ref 1.4–7.0)
Neutrophils: 69 %
Platelets: 389 10*3/uL (ref 150–450)
RBC: 4.12 x10E6/uL (ref 3.77–5.28)
RDW: 13.7 % (ref 11.7–15.4)
WBC: 9.3 10*3/uL (ref 3.4–10.8)

## 2021-12-09 NOTE — Progress Notes (Signed)
? ?THERAPIST PROGRESS NOTE ? ? ?Virtual Visit via Video Note ? ?I connected with Deborah Barnes on 12/09/21 at  8:00 AM EDT by a video enabled telemedicine application and verified that I am speaking with the correct person using two identifiers. ? ?Location: ?Patient: Home ?Provider: [facility] ?  ?I discussed the limitations of evaluation and management by telemedicine and the availability of in person appointments. The patient expressed understanding and agreed to proceed. ?I discussed the assessment and treatment plan with the patient. The patient was provided an opportunity to ask questions and all were answered. The patient agreed with the plan and demonstrated an understanding of the instructions. ?  ?The patient was advised to call back or seek an in-person evaluation if the symptoms worsen or if the condition fails to improve as anticipated. ? ?I provided 39 minutes of non-face-to-face time during this encounter. ? ?Participation Level: Active ? ?Behavioral Response: CasualAlertEuthymic ? ?Type of Therapy: Individual Therapy ? ?Treatment Goals addressed: Dep/stressors/coping ? ?ProgressTowards Goals: Progressing ? ?Interventions: Solution Focused and Supportive ? ?Summary: Deborah Barnes is a 46 y.o. female who presents with hx of MDD, substance abuse.  Today patient logs on for video session per schedule.  Patient last seen November 25, 2021.  Today patient is in an uplifted mood.  She reports on the recovery from her dental situation she was in the midst of last session, which also turned into a severe sinus infection.  She advises of feeling more self-conscious with the loss of more teeth.  She is vocal and insightful that this is all a result of her past drug use and hopes at some point she will be able to to afford a partial.  LCSW provided strategy on saving for partial.  Deborah Barnes verbalizes understanding.  Today Deborah Barnes advises she is thinking of replacing both of her part-time jobs with a full-time job.   She advises she is hoping to find an office job and is applying to jobs on a regular basis.  She reports some discouragement on the amount of contact she gets back versus what she is sending out.  LCSW encouraged Deborah Barnes to follow-up with [facility] resource [facility] to have their job coach critique her resume which patient states she intends to do.  Deborah Barnes advises she now has her high school diploma versus a GED.  She gets it out, displays it proudly and shares how this came to be.  LCSW commended her for this achievement and shared in her joy.  Patient provides updates on both of her children.  She also provides updates on her recovery work and her status in the [facility].  She advises she did resign from being the contact person at the [facility] which went well and she feels great relief from no longer having this responsibility, setting boundaries.  Deborah Barnes states May 7 will be her fourth year of sobriety.  She advises she is currently on step 9 in AA and talks about how wonderful her sponsor is that she has been working with since late last year.  LCSW provided ongoing encouragement with early congratulations for her achievement of 4 years on May 7.  Remainder of session spent addressing transitioning care to a new counselor as this is LCSW's last session with Deborah Barnes.  LCSW advised her of transition plan.  Facilitated reminiscence of her improvement since working together. Reviewed coping strategies.  Patient verbalizes understanding about transition plan.  She states appreciation for all care provided. ? ?Suicidal/Homicidal: Nowithout intent/plan ? ?  Therapist Response: Pt  ? ?Plan: Return again for next avail appt with new counselor as this LCSW has resigned. ? ?Diagnosis: MDD, recurrent, partial remission ? ?Collaboration of Care: Scientist, research (life sciences)) [facility] ? ?Patient/Guardian was advised Release of Information must be obtained prior to any record release in order to collaborate  their care with an outside provider. Patient/Guardian was advised if they have not already done so to contact the registration department to sign all necessary forms in order for [place] to release information regarding their care.  ? ?Consent: Patient/Guardian gives verbal consent for treatment and assignment of benefits for services provided during this visit. Patient/Guardian expressed understanding and agreed to proceed.  ? ?Hermine Messick, LCSW ?12/09/2021 ? ?

## 2021-12-15 ENCOUNTER — Other Ambulatory Visit: Payer: Self-pay

## 2021-12-20 ENCOUNTER — Encounter (HOSPITAL_COMMUNITY): Payer: Self-pay

## 2021-12-20 ENCOUNTER — Ambulatory Visit (INDEPENDENT_AMBULATORY_CARE_PROVIDER_SITE_OTHER): Payer: No Payment, Other | Admitting: Licensed Clinical Social Worker

## 2021-12-20 DIAGNOSIS — F3341 Major depressive disorder, recurrent, in partial remission: Secondary | ICD-10-CM | POA: Diagnosis not present

## 2021-12-20 DIAGNOSIS — F4310 Post-traumatic stress disorder, unspecified: Secondary | ICD-10-CM | POA: Insufficient documentation

## 2021-12-20 NOTE — Plan of Care (Signed)
?  Problem: PTSD-Trauma Disorder CCP Problem  1 Learn and Apply Coping Skills to Decrease Trauma Response Symptoms   ?Goal: LTG: Reduce frequency, intensity, and duration of PTSD symptoms so daily functioning is improved by self-report. ?Outcome: Not Applicable ?Goal: LTG: Elimination of maladaptive behaviors and thinking patterns which interfere with resolution of trauma: per self-report ?Outcome: Not Applicable ?Goal: STG: Increase participation in previously avoided activities such as attending large [place] meetings ?Outcome: Not Applicable ?  ?
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
## 2021-12-20 NOTE — BH OP Treatment Plan (Signed)
Pt agrees to tx plan °

## 2021-12-21 ENCOUNTER — Ambulatory Visit (HOSPITAL_COMMUNITY): Payer: No Payment, Other | Admitting: Licensed Clinical Social Worker

## 2021-12-23 ENCOUNTER — Ambulatory Visit (HOSPITAL_COMMUNITY): Payer: No Payment, Other | Admitting: Licensed Clinical Social Worker

## 2021-12-27 ENCOUNTER — Other Ambulatory Visit: Payer: Self-pay | Admitting: Internal Medicine

## 2021-12-27 ENCOUNTER — Other Ambulatory Visit: Payer: Self-pay | Admitting: Nurse Practitioner

## 2021-12-27 DIAGNOSIS — I10 Essential (primary) hypertension: Secondary | ICD-10-CM

## 2021-12-27 DIAGNOSIS — K219 Gastro-esophageal reflux disease without esophagitis: Secondary | ICD-10-CM

## 2021-12-28 ENCOUNTER — Encounter: Payer: Self-pay | Admitting: Internal Medicine

## 2021-12-28 ENCOUNTER — Other Ambulatory Visit (HOSPITAL_COMMUNITY): Payer: Self-pay

## 2021-12-28 ENCOUNTER — Other Ambulatory Visit: Payer: Self-pay

## 2021-12-28 MED ORDER — METOPROLOL TARTRATE 50 MG PO TABS
50.0000 mg | ORAL_TABLET | Freq: Two times a day (BID) | ORAL | 1 refills | Status: DC
  Filled 2021-12-28 (×2): qty 60, 30d supply, fill #0
  Filled 2022-01-28: qty 60, 30d supply, fill #1

## 2021-12-29 ENCOUNTER — Telehealth (HOSPITAL_COMMUNITY): Payer: No Payment, Other | Admitting: Psychiatry

## 2021-12-30 ENCOUNTER — Other Ambulatory Visit: Payer: Self-pay

## 2021-12-30 ENCOUNTER — Telehealth: Payer: Self-pay | Admitting: Internal Medicine

## 2021-12-30 ENCOUNTER — Ambulatory Visit: Payer: Self-pay | Admitting: Nurse Practitioner

## 2021-12-30 ENCOUNTER — Other Ambulatory Visit (HOSPITAL_COMMUNITY): Payer: Self-pay | Admitting: Psychiatry

## 2021-12-30 DIAGNOSIS — K219 Gastro-esophageal reflux disease without esophagitis: Secondary | ICD-10-CM

## 2021-12-30 DIAGNOSIS — F319 Bipolar disorder, unspecified: Secondary | ICD-10-CM

## 2021-12-30 DIAGNOSIS — F419 Anxiety disorder, unspecified: Secondary | ICD-10-CM

## 2021-12-30 MED ORDER — OMEPRAZOLE 40 MG PO CPDR
40.0000 mg | DELAYED_RELEASE_CAPSULE | Freq: Every day | ORAL | 0 refills | Status: DC
  Filled 2021-12-30: qty 30, 30d supply, fill #0

## 2021-12-30 NOTE — Telephone Encounter (Signed)
Patient called requesting a refill of her omeprazole.  Pharmacy informed her no more refills until she had an appt with Dr. Lorenso Courier.  Her appointment is scheduled for 5/3.  Can you call in enough to last her until appt?  If there is an issue, please call patient and advise.  Thank you. ?

## 2021-12-30 NOTE — Telephone Encounter (Signed)
Outpatient Medication Detail ? ? Disp Refills Start End   ?omeprazole (PRILOSEC) 40 MG capsule 30 capsule 0 12/30/2021    ?Sig - Route: Take 1 capsule (40 mg total) by mouth daily. - Oral   ?Sent to pharmacy as: omeprazole (PRILOSEC) 40 MG capsule   ? ?

## 2021-12-31 ENCOUNTER — Telehealth: Payer: Self-pay | Admitting: Nurse Practitioner

## 2021-12-31 ENCOUNTER — Other Ambulatory Visit: Payer: Self-pay

## 2021-12-31 ENCOUNTER — Telehealth (HOSPITAL_COMMUNITY): Payer: Self-pay | Admitting: *Deleted

## 2021-12-31 NOTE — Telephone Encounter (Signed)
PATIENT CALLED  & STATED THAT SHE WAS TOLD BY SOMEONE THERE THAT SHE STATED SHE NO LONGERS TAKES CITALOPRAM  ? ?ACCORDING TO Dr Ronne Binning LAST NOTE : Continue- citalopram (CELEXA) 20 MG tablet; Take 1 tablet (40 mg total) by mouth in the morning and at bedtime.  Dispense: 60 tablet; Refill: 3 ? ?PER KELLY GOODWIN CPHT WITH [facility]::  ?Right.  But, somehow it was discontinued so it has to be resent.  Once it is discontinued we can't fill it anymore on our end.  It shows that it was discontinued by Dionisio David 'Patient Preference' on 12/26/21.  So, I don't know if she discussed something with her pcp at the time, but pcp discontinued it? ? ? ?INFORMED PATIENT [facility] PCP BACK TO HAVE MEDICATION RE-STARTED. BECAUSE SHE NEVER INFORMED OR MADE THAT STATEMENT THAT WASN'T NO LONGER TAKING THE MEDICATION ? --PATIENT THANKED ME FOR HELPING & THAT SHE WILL BE CALLING HER PCP ?

## 2022-01-03 ENCOUNTER — Ambulatory Visit (INDEPENDENT_AMBULATORY_CARE_PROVIDER_SITE_OTHER): Payer: No Payment, Other | Admitting: Licensed Clinical Social Worker

## 2022-01-03 DIAGNOSIS — F4310 Post-traumatic stress disorder, unspecified: Secondary | ICD-10-CM

## 2022-01-03 NOTE — Progress Notes (Signed)
Virtual Visit via Video Note ? ?I connected with Deborah Barnes on 01/03/22 at  8:00 AM EDT by a video enabled telemedicine application and verified that I am speaking with the correct person using two identifiers. ? ?Location: ?Patient: pt's home ?Provider: clinical office ?  ?I discussed the limitations of evaluation and management by telemedicine and the availability of in person appointments. The patient expressed understanding and agreed to proceed. ?  ?I discussed the assessment and treatment plan with the patient. The patient was provided an opportunity to ask questions and all were answered. The patient agreed with the plan and demonstrated an understanding of the instructions. ?  ?The patient was advised to call back or seek an in-person evaluation if the symptoms worsen or if the condition fails to improve as anticipated. ? ?I provided 50 minutes of non-face-to-face time during this encounter. ? ? ?Heron Nay, LCSWA ? ? ? ?THERAPIST PROGRESS NOTE ? ?Session Time: 50 minutes ? ?Participation Level: Active ? ?Behavioral Response: CasualAlertDepressed ? ?Type of Therapy: Individual Therapy ? ?Treatment Goals addressed: coping, depression ? ?ProgressTowards Goals: Progressing ? ?Interventions: Solution Focused, Strength-based, and Supportive ? ?Summary: Deborah Barnes is a 46 y.o. female who presents with f/u with this cln. She states she is currently off Celexa (since last Tuesday/Wednesday) because her PCP discontinued it and pt describes barriers to getting her prescription fixed. States she has an appt for med man tomorrow. She states she has experienced dizziness, depressed mood, "foggy headed" and tearfulness. Denies SI. States she has been leaning on her faith and keeping herself busy to cope. Also reports a resident left, someone pt has known for a while, and states she left on good terms. She also reports she wants to learn how to meditate in order to "be in silence" as she states she  always requires background noise. 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 She describes other aspects of her job that she enjoys. She discusses interpersonal relationships within [facility] and some conflict and verbalizes her commitment to continue growing. She states her 4 year sobriety anniversary is on 5/7. ? ?Suicidal/Homicidal: Nowithout intent/plan ? ?Therapist Response: Cln assessed for current symptoms, stressors, and safety. Cln recommended guided meditation to begin a meditation practice and highlighted strengths. Cln discussed plan should pt enter crisis prior to med man appt to include going to [facility] if needed, to which pt was agreeable. Cln scheduled f/u appointment and confirmed pt's availability and preferred method of service delivery. ? ?Plan: Return again in 3 weeks. ? ?Diagnosis: PTSD (post-traumatic stress disorder) ? ?Collaboration of Care: Other none required for this visit. ? ?Patient/Guardian was advised Release of Information must be obtained prior to any record release in order to collaborate their care with an outside provider. Patient/Guardian was advised if they have not already done so to contact the registration department to sign all necessary forms in order for [place] to release information regarding their care.  ? ?Consent: Patient/Guardian gives verbal consent for treatment and assignment of benefits for services provided during this visit. Patient/Guardian expressed understanding and agreed to proceed.  ? ?Heron Nay, LCSWA ?01/03/2022 ? ?

## 2022-01-04 ENCOUNTER — Other Ambulatory Visit: Payer: Self-pay

## 2022-01-04 ENCOUNTER — Telehealth (INDEPENDENT_AMBULATORY_CARE_PROVIDER_SITE_OTHER): Payer: No Payment, Other | Admitting: Psychiatry

## 2022-01-04 DIAGNOSIS — F419 Anxiety disorder, unspecified: Secondary | ICD-10-CM | POA: Diagnosis not present

## 2022-01-04 MED ORDER — CITALOPRAM HYDROBROMIDE 20 MG PO TABS
20.0000 mg | ORAL_TABLET | Freq: Every day | ORAL | 0 refills | Status: DC
  Filled 2022-01-04: qty 30, 30d supply, fill #0
  Filled 2022-01-31: qty 30, 30d supply, fill #1

## 2022-01-04 MED ORDER — HYDROXYZINE HCL 10 MG PO TABS
10.0000 mg | ORAL_TABLET | Freq: Three times a day (TID) | ORAL | 2 refills | Status: DC | PRN
  Filled 2022-01-04 – 2022-02-11 (×2): qty 90, 30d supply, fill #0
  Filled 2022-03-21: qty 90, 30d supply, fill #1

## 2022-01-04 NOTE — Progress Notes (Signed)
BH MD/PA/NP OP Progress Note ? ?01/04/2022 2:01 PM ?Deborah Barnes  ?MRN:  [id number] ? ?Virtual Visit via Video Note ? ?I connected with Deborah Barnes on 01/04/22 at  8:30 AM EDT by a video enabled telemedicine application and verified that I am speaking with the correct person using two identifiers. ? ?Location: ?Patient: home ?Provider: offsite ?  ?I discussed the limitations of evaluation and management by telemedicine and the availability of in person appointments. The patient expressed understanding and agreed to proceed. ? ?  ?I discussed the assessment and treatment plan with the patient. The patient was provided an opportunity to ask questions and all were answered. The patient agreed with the plan and demonstrated an understanding of the instructions. ?  ?The patient was advised to call back or seek an in-person evaluation if the symptoms worsen or if the condition fails to improve as anticipated. ? ?I provided 10 minutes of non-face-to-face time during this encounter. ? ? ?Franne Grip, NP  ? ?Chief Complaint: Medication management ? ?HPI: Deborah Barnes is a 46 year old female presenting to [facility] behavioral health outpatient for follow-up psychiatric evaluation.  She has a psychiatric history of PTSD, major depressive disorder, and anxiety.  Her symptoms are managed with Celexa 20 mg daily and hydroxyzine 10 mg 3 times daily as needed for anxiety.  She reports that this medication regimen is effective with managing her symptoms.  Patient denies adverse medication effects or the need for dosage adjustment today. ? ?Patient is alert and oriented x4, calm, pleasant and willing to engage.  She reports a good appetite, sleep and mood.  Patient reports that her she has been experiencing social anxiety due to running out of her Celexa.  She reports last taken Celexa 1 week ago.  Today patient requested refills on Celexa.  Patient appears well-groomed and appropriately dressed for the weather.   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  Patient attends [place] meetings and has a sponsor.  Patient reports participating in therapy for symptom management as well.  Patient denies suicidal or homicidal ideations, paranoia, delusional thought, auditory or visual hallucinations. ? ? ?Visit Diagnosis:  ?  ICD-10-CM   ?1. Anxiety  F41.9 hydrOXYzine (ATARAX) 10 MG tablet  ?  ? ? ?Past Psychiatric History:  ? ?Past Medical History:  ?Past Medical History:  ?Diagnosis Date  ? Asthma   ? Back pain   ? Bipolar disorder ([place])   ? DX in the past but not currently be treated  ? Bulging disc   ? Complication of anesthesia 2014  ? anesthesia ? caused depression/suicidal ideation per pt.  ? COPD (chronic obstructive pulmonary disease) ([place])   ? De Quervain's tenosynovitis, right 10/2019  ? surgery  ? Degenerative disc disease   ? Depression   ? Depression with anxiety   ? DJD (degenerative joint disease)   ? right hip  ? GERD (gastroesophageal reflux disease)   ? Hepatitis C 08/2017  ? TX for HEP C  ? Hip pain, right   ? History of alcohol abuse   ? History of IBS   ? History of mixed drug abuse ([place])   ? Hypercholesterolemia   ? Hypertension   ? Hypoglycemia   ? Hypothyroidism   ? PTSD (post-traumatic stress disorder)   ? Sciatica   ? Shortness of breath   ? Thyroid disease   ? Vitamin D deficiency 02/2020  ? Walker as ambulation  aid   ?  ?Past Surgical History:  ?Procedure Laterality Date  ? BIOPSY N/A 09/25/2014  ? Procedure: BIOPSY;  Surgeon: Daneil Dolin, MD;  Location: [facility];  Service: Endoscopy;  Laterality: N/A;  Gastric, Ascending Colon, Descending/Sigmoid Colon   ? CARPAL TUNNEL RELEASE Bilateral   ? CHOLECYSTECTOMY N/A 04/26/2013  ? Procedure: LAPAROSCOPIC CHOLECYSTECTOMY;  Surgeon: Jamesetta So, MD;  Location: [facility];  Service: General;  Laterality: N/A;  ? COLONOSCOPY WITH PROPOFOL N/A 09/25/2014  ? RMR: Pancolonic diverticulosis. multiple tubular adenomas,  segmental biopsies negative. surveillance in 5 years  ? COLONOSCOPY WITH PROPOFOL N/A 01/16/2020  ? diverticulosis, two 4-6 mm polyps in sigmoid and splenic flexure, one 10 mm polyp in sigmoid Tubular adenomas, 3 year surveillance.  ? DORSAL COMPARTMENT RELEASE Right 10/30/2019  ? Procedure: RIGHT WRIST 1ST  DORSAL COMPARTMENT RELEASE (DE[place]ERVAIN);  Surgeon: Marybelle Killings, MD;  Location: [place];  Service: Orthopedics;  Laterality: Right;  ? ESOPHAGOGASTRODUODENOSCOPY (EGD) WITH PROPOFOL N/A 09/25/2014  ? RMR: Normal esophagus. Abnormal gastric mucosa status post biopsy (negative H.pylori). Hiatal hernia.   ? ESOPHAGOGASTRODUODENOSCOPY (EGD) WITH PROPOFOL N/A 01/16/2020  ? Normal esophagus s/p dilation, normal stomach and duodenal bulb.   ? INCISIONAL HERNIA REPAIR N/A 10/04/2013  ? Procedure: HERNIA REPAIR INCISIONAL WITH MESH;  Surgeon: Jamesetta So, MD;  Location: [facility];  Service: General;  Laterality: N/A;  ? INSERTION OF MESH N/A 10/04/2013  ? Procedure: INSERTION OF MESH;  Surgeon: Jamesetta So, MD;  Location: [facility];  Service: General;  Laterality: N/A;  ? MALONEY DILATION N/A 01/16/2020  ? Procedure: MALONEY DILATION;  Surgeon: Daneil Dolin, MD;  [facility];  Service: Endoscopy;  Laterality: N/A;  ? POLYPECTOMY N/A 09/25/2014  ? Procedure: POLYPECTOMY;  Surgeon: Daneil Dolin, MD;  Location: [facility];  Service: Endoscopy;  Laterality: N/A;  Cecal, Ascending Colon   ? POLYPECTOMY  01/16/2020  ? Procedure: POLYPECTOMY;  Surgeon: Daneil Dolin, MD;  [facility];  Service: Endoscopy;;  ? SHOULDER SURGERY Left   ? TOOTH EXTRACTION  08/09/2019  ? TOTAL HIP ARTHROPLASTY Right 04/27/2020  ? Procedure: TOTAL HIP ARTHROPLASTY DIRECT ANTERIOR;  Surgeon: Marybelle Killings, MD;  Location: [place];  Service: Orthopedics;  Laterality: Right;  ? TUBAL LIGATION    ? WISDOM TOOTH EXTRACTION    ? ? ?Family Psychiatric History: n/a ? ?Family History:  ?Family History  ?Problem Relation Age of Onset  ? Diabetes  Father   ? Colon polyps Maternal Aunt   ? Colon cancer Maternal Grandmother   ? Heart disease Paternal Grandmother   ? Cancer Paternal Grandmother   ?     esophageal  ? Hypertension Paternal Grandmother   ? GER disease Paternal Grandmother   ? Osteoporosis Paternal Grandmother   ? ? ?Social History:  ?Social History  ? ?Socioeconomic History  ? Marital status: Divorced  ?  Spouse name: Not on file  ? Number of children: Not on file  ? Years of education: Not on file  ? Highest education level: Not on file  ?Occupational History  ? Not on file  ?Tobacco Use  ? Smoking status: Former  ?  Packs/day: 0.50  ?  Years: 27.00  ?  Pack years: 13.50  ?  Types: Cigarettes  ?  Quit date: 08/24/2018  ?  Years since quitting: 3.3  ? Smokeless tobacco: Never  ? Tobacco comments:  ?  vape former user  ?Vaping Use  ?  Vaping Use: Every day  ? Last attempt to quit: 08/24/2018  ?Substance and Sexual Activity  ? Alcohol use: Not Currently  ?  Alcohol/week: 1.0 standard drink  ?  Types: 1 Cans of beer per week  ?  Comment: hx binge drinker. none since 01/23/18  ? Drug use: Not Currently  ?  Types: Marijuana, Cocaine, Methamphetamines, Heroin  ?  Comment: no cocaine, heroin, meth since 01/23/18. last marijuana use 01-23-18  ? Sexual activity: Not Currently  ?  Birth control/protection: Surgical  ?  Comment: tubal ligation  ?Other Topics Concern  ? Not on file  ?Social History Narrative  ? Not on file  ? ?Social Determinants of Health  ? ?Financial Resource Strain: Not on file  ?Food Insecurity: Not on file  ?Transportation Needs: Not on file  ?Physical Activity: Not on file  ?Stress: Not on file  ?Social Connections: Not on file  ? ? ?Allergies:  ?Allergies  ?Allergen Reactions  ? Clarithromycin Diarrhea  ?  abd pain  ? Propoxyphene Itching  ? ? ?Metabolic Disorder Labs: ?Lab Results  ?Component Value Date  ? HGBA1C 5.4 09/24/2021  ? HGBA1C 5.4 09/24/2021  ? HGBA1C 5.4 (A) 09/24/2021  ? HGBA1C 5.4 09/24/2021  ? MPG 100 09/05/2017  ? ?No results  found for: PROLACTIN ?Lab Results  ?Component Value Date  ? CHOL 169 03/17/2021  ? TRIG 269 (H) 03/17/2021  ? HDL 35 (L) 03/17/2021  ? CHOLHDL 4.8 (H) 03/17/2021  ? VLDL 27 09/05/2017  ? [place] 89 03/17/2021  ? L

## 2022-01-14 ENCOUNTER — Other Ambulatory Visit: Payer: Self-pay

## 2022-01-19 ENCOUNTER — Other Ambulatory Visit: Payer: Self-pay

## 2022-01-19 ENCOUNTER — Encounter: Payer: Self-pay | Admitting: Internal Medicine

## 2022-01-19 ENCOUNTER — Ambulatory Visit (INDEPENDENT_AMBULATORY_CARE_PROVIDER_SITE_OTHER): Payer: Self-pay | Admitting: Internal Medicine

## 2022-01-19 VITALS — BP 150/80 | HR 72 | Ht 64.5 in | Wt 286.0 lb

## 2022-01-19 DIAGNOSIS — Z8619 Personal history of other infectious and parasitic diseases: Secondary | ICD-10-CM

## 2022-01-19 DIAGNOSIS — K219 Gastro-esophageal reflux disease without esophagitis: Secondary | ICD-10-CM

## 2022-01-19 DIAGNOSIS — R197 Diarrhea, unspecified: Secondary | ICD-10-CM

## 2022-01-19 DIAGNOSIS — K760 Fatty (change of) liver, not elsewhere classified: Secondary | ICD-10-CM

## 2022-01-19 DIAGNOSIS — K921 Melena: Secondary | ICD-10-CM

## 2022-01-19 DIAGNOSIS — R1011 Right upper quadrant pain: Secondary | ICD-10-CM

## 2022-01-19 MED ORDER — COLESTIPOL HCL 1 G PO TABS
2.0000 g | ORAL_TABLET | Freq: Two times a day (BID) | ORAL | 11 refills | Status: DC
  Filled 2022-01-19 – 2022-01-20 (×3): qty 120, 30d supply, fill #0
  Filled 2022-02-21: qty 120, 30d supply, fill #1

## 2022-01-19 NOTE — Progress Notes (Signed)
? ?Chief Complaint: Abdominal apin ? ?HPI : 46 year old female with history of chronic HCV s/p treatment, IBS, asthma, COPD, PTSD presents for follow up of abdominal pain  ? ?Patient works at the [employer]. ? ?Interval History: Her abdominal pain episodes of have much improved. She has been trying to eat a healthier diet, which she thinks has helped with her abdominal pain. She does recognize that she has a food addiction. She has been using omeprazole 40 mg QD, which has helped significantly with her symptoms. She is still using ibuprofen once or twic a day for some arthritis issues. She is no longer seeing any black stools at all, though she has seen them in the past. She is on average having 3-4 BMs per day. Very occasionally has nocturnal stools. Work is still stressful for her. ? ?Current Outpatient Medications  ?Medication Sig Dispense Refill  ? acetaminophen (TYLENOL) 500 MG tablet Take 500 mg by mouth every 6 (six) hours as needed.    ? albuterol (PROVENTIL HFA) 108 (90 Base) MCG/ACT inhaler Inhale 1-2 puffs into the lungs every 6 (six) hours as needed for wheezing or shortness of breath. 8.5 g 1  ? blood glucose meter kit and supplies Dispense based on patient and insurance preference. Use up to four times daily as directed. (FOR ICD-10 E10.9, E11.9). 1 each 0  ? citalopram (CELEXA) 20 MG tablet Take 1 tablet (20 mg total) by mouth daily. 60 tablet 0  ? hydrOXYzine (ATARAX) 10 MG tablet Take 1 tablet (10 mg total) by mouth 3 (three) times daily as needed. 90 tablet 2  ? levothyroxine (SYNTHROID) 50 MCG tablet Take 1 tablet (50 mcg total) by mouth daily before breakfast. 90 tablet 3  ? metFORMIN (GLUCOPHAGE-XR) 750 MG 24 hr tablet Take 1 tablet (750 mg total) by mouth daily with breakfast. 90 tablet 3  ? metoprolol tartrate (LOPRESSOR) 50 MG tablet Take 1 tablet (50 mg total) by mouth 2 (two) times daily. 60 tablet 1  ? omeprazole (PRILOSEC) 40 MG capsule Take 1 capsule (40 mg total) by mouth daily. 30  capsule 0  ? simethicone (MYLICON) 785 MG chewable tablet Chew 125 mg by mouth every 6 (six) hours as needed for flatulence.    ? ?No current facility-administered medications for this visit.  ? ?Review of Systems: ?All systems reviewed and negative except where noted in HPI.  ? ?Physical Exam: ?BP (!) 150/80   Pulse 72   Ht 5' 4.5" (1.638 m)   Wt 286 lb (129.7 kg)   BMI 48.33 kg/m?  ?Constitutional: Pleasant,well-developed, female in no acute distress. ?HEENT: Normocephalic and atraumatic. Conjunctivae are normal. No scleral icterus. ?Cardiovascular: Normal rate, regular rhythm.  ?Pulmonary/chest: Effort normal and breath sounds normal. No wheezing, rales or rhonchi. ?Abdominal: Soft, nondistended, nontender. Bowel sounds active throughout. There are no masses palpable. No hepatomegaly. ?Extremities: No edema ?Neurological: Alert and oriented to person place and time. ?Skin: Skin is warm and dry. No rashes noted. ?Psychiatric: Normal mood and affect. Behavior is normal. ? ?Labs 02/2021: TSH nml ? ?Labs 05/2021: CBC with mildly dec Hb of 11.9. CMP nml. Lipase nml. ? ?Labs 08/2021: Nml LFTs. Neg ANA, ASMA, TTG IgA. Nml IgA and IgG. ? ?Labs 09/2021: CMP, FT4, TSH all normal. ? ?Labs 11/2021: CBC nml. ? ?Ab U/S 06/30/21: ?IMPRESSION: ?1. The common bile duct is dilated measuring 12 mm, which may reflect reservoir effect post cholecystectomy. Suggest correlation with laboratory values to exclude biliary obstruction. If clinical concern  for biliary obstruction this could be further evaluated with MRCP with and without contrast. ?2. The echogenicity of the liver is increased. This is a nonspecific finding but is most commonly seen with fatty infiltration of the liver. There are no obvious focal liver lesions. ? ?MRCP 09/30/21: ?IMPRESSION:  ?Mild biliary ductal dilatation, likely secondary to prior  ?cholecystectomy. No radiographic evidence of choledocholithiasis or  ?other obstructing etiology ? ?Liver elastography  09/30/21: ?IMPRESSION:  ?ULTRASOUND LIVER:  ?Fatty liver.  ?ULTRASOUND HEPATIC ELASTOGRAPHY:  ?Median kPa: 3.0  ?Diagnostic category: High probability of being normal ? ?EGD 09/25/14: ?Normal appearing patent tubular esophagus. Stomach empty. Small hiatal hernia. No ulcer or infiltrative processes. Linear erythema ?and scattered erosions - antrum. Patent pylorus. Normal first and second portion of the duodenum. ?Biopsies of abnormal gastric mucosa taken for histologic study. Retroflexed views revealed a hiatal hernia, Retroflexed views revealed. The scope was then withdrawn from the patient and the procedure completed ?Path: ?Stomach, biopsy ?- BENIGN ANTRAL GASTRIC MUCOSA WITH MINIMAL CHRONIC INACTIVE INFLAMMATION. ?- WARTHIN-STARRY STAIN IS NEGATIVE FOR HELICOBACTER PYLORI. ?- NO INTESTINAL METAPLASIA, DYSPLASIA, OR MALIGNANCY. ? ?Colonoscopy 09/25/14: ?ENDOSCOPIC IMPRESSION: ?Pancolonic diverticulosis. Multiple colonic polyps removed as described above. status post segmental biopsy ?Path: ?2. Colon, polyp(s), cecal ?- TUBULAR ADENOMA (X1). ?- NO HIGH GRADE DYSPLASIA OR MALIGNANCY IDENTIFIED. ?3. Colon, biopsy, ascending ?- BENIGN COLORECTAL MUCOSA. ?- ASSOCIATED BENIGN LYMPHOID AGGREGATE. ?- NO EVIDENCE OF SIGNIFICANT INFLAMMATION, DYSPLASIA OR MALIGNANCY. ?4. Colon, polyp(s), ascending ?- TUBULAR ADENOMA (X1). ?- NO HIGH GRADE DYSPLASIA OR MALIGNANCY IDENTIFIED. ?5. Colon, biopsy, descending/sigmoid ?- BENIGN COLORECTAL MUCOSA. ?- ASSOCIATED BENIGN LYMPHOID AGGREGATE. ?- NO EVIDENCE OF SIGNIFICANT INFLAMMATION, DYSPLASIA OR MALIGNANCY ? ?EGD 01/16/20: ?- Normal esophagus. Dilated. (Did not help) ?- Normal stomach. ?- Normal duodenal bulb and second portion of the duodenum. ?- No specimens collected. ? ?Colonoscopy 01/16/20: ?The perianal and digital rectal examinations were normal. ?Scattered small and large-mouthed diverticula were found in the entire colon. ?Two semi-pedunculated polyps were found in the sigmoid  colon and splenic flexure. The polyps were 4 to 6 mm in size. These polyps were removed with a cold snare. Resection and retrieval were complete. ?A 10 mm polyp was found in the sigmoid colon. The polyp was pedunculated. The polyp was removed with a hot snare. Resection and retrieval were complete. ?Path: ?A. COLON, SPLENIC FLEXURE POLYPECTOMY:  ?- Tubular adenoma(s).  ?- High grade dysplasia is not identified.  ?B. COLON, SIGMOID, POLYPECTOMY:  ?- Tubular adenoma(s).  ?- High grade dysplasia is not identified.  ?- The cauterized tissue edge appears negative for glandular dysplasia.  ? ?ASSESSMENT AND PLAN: ? ?Melena ?RUQ ab pain ?Diarrhea ?GERD ?Fatty liver ?HCV s/p treatment ?Patient has been doing well since her last clinic visit. Her MRCP and elastography imaging showed that her dilated CBD was due to post-cholecystectomy effect. Her elastography did not show significant signs of liver fibrosis. Her liver work up was unrevealing for an alternatively etiology of her fatty liver disease besides AFLD/NAFLD. Patient still describes some issues with loose stools so will trial her on colestipol 2 mg BID to see if bile acid diarrhea may the cause of her diarrhea. Since patient did describe some issues with melena, which have improved with the use of omeprazole, will plan for EGD in order to rule out malignancy. ?- Low FODMAP diet ?- Cont omeprazole 40 mg QD ?- Patient will try to drop off stool sample to check O&P, H pylori, fecal elastase ?- Start colestipol 2 mg BID  ?-  Encourage weight loss and avoiding NSAID use if possible ?- Colonoscopy next due 12/2022 ?- EGD [facility] ? ?[name], MD ? ?I spent 41 minutes of time, including in depth chart review, independent review of results as outlined above, communicating results with the patient directly, face-to-face time with the patient, coordinating care, ordering studies and medications as appropriate, and documentation. ? ?

## 2022-01-19 NOTE — Patient Instructions (Signed)
We have sent the following medications to your pharmacy for you to pick up at your convenience:colestipol.  ? ?You have been given a low-fodmap diet to follow.  ? ?It has been recommended to you by your physician that you have a(n) Upper Endoscopy completed. Per your request, we did not schedule the procedure(s) today. Please contact our office at [phone] should you decide to have the procedure completed. You will be scheduled for a pre-visit and procedure at that time. ? ?The [place] [facility] providers would like to encourage you to use [facility] to communicate with providers for non-urgent requests or questions.  Due to long hold times on the telephone, sending your provider a message by [facility] may be a faster and more efficient way to get a response.  Please allow 48 business hours for a response.  Please remember that this is for non-urgent requests.  ? ?Thank you for choosing me and [facility]. ? ? ? ?

## 2022-01-20 ENCOUNTER — Other Ambulatory Visit: Payer: Self-pay

## 2022-01-21 ENCOUNTER — Other Ambulatory Visit: Payer: Self-pay

## 2022-01-24 ENCOUNTER — Ambulatory Visit (INDEPENDENT_AMBULATORY_CARE_PROVIDER_SITE_OTHER): Payer: No Payment, Other | Admitting: Licensed Clinical Social Worker

## 2022-01-24 DIAGNOSIS — F4310 Post-traumatic stress disorder, unspecified: Secondary | ICD-10-CM
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
## 2022-01-28 ENCOUNTER — Other Ambulatory Visit: Payer: Self-pay

## 2022-01-28 DIAGNOSIS — M5430 Sciatica, unspecified side: Secondary | ICD-10-CM

## 2022-01-28 HISTORY — DX: Sciatica, unspecified side: M54.30

## 2022-01-31 ENCOUNTER — Other Ambulatory Visit: Payer: Self-pay

## 2022-01-31 ENCOUNTER — Other Ambulatory Visit: Payer: Self-pay | Admitting: Nurse Practitioner

## 2022-01-31 DIAGNOSIS — K219 Gastro-esophageal reflux disease without esophagitis: Secondary | ICD-10-CM

## 2022-02-01 ENCOUNTER — Ambulatory Visit (INDEPENDENT_AMBULATORY_CARE_PROVIDER_SITE_OTHER): Payer: Self-pay

## 2022-02-01 ENCOUNTER — Encounter: Payer: Self-pay | Admitting: Orthopaedic Surgery

## 2022-02-01 ENCOUNTER — Ambulatory Visit (INDEPENDENT_AMBULATORY_CARE_PROVIDER_SITE_OTHER): Payer: Self-pay | Admitting: Orthopaedic Surgery

## 2022-02-01 ENCOUNTER — Other Ambulatory Visit: Payer: Self-pay

## 2022-02-01 VITALS — BP 142/85 | HR 73 | Ht 64.5 in | Wt 275.0 lb

## 2022-02-01 DIAGNOSIS — M545 Low back pain, unspecified: Secondary | ICD-10-CM

## 2022-02-01 DIAGNOSIS — M542 Cervicalgia: Secondary | ICD-10-CM

## 2022-02-01 MED ORDER — OMEPRAZOLE 40 MG PO CPDR
40.0000 mg | DELAYED_RELEASE_CAPSULE | Freq: Every day | ORAL | 0 refills | Status: DC
  Filled 2022-02-01: qty 30, 30d supply, fill #0
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
## 2022-02-03 ENCOUNTER — Other Ambulatory Visit (HOSPITAL_COMMUNITY): Payer: Self-pay

## 2022-02-11 ENCOUNTER — Other Ambulatory Visit: Payer: Self-pay

## 2022-02-16 ENCOUNTER — Other Ambulatory Visit: Payer: Self-pay

## 2022-02-21 ENCOUNTER — Other Ambulatory Visit: Payer: Self-pay

## 2022-02-22 ENCOUNTER — Other Ambulatory Visit: Payer: Self-pay

## 2022-02-23 ENCOUNTER — Other Ambulatory Visit: Payer: Self-pay

## 2022-02-23 ENCOUNTER — Other Ambulatory Visit (HOSPITAL_COMMUNITY): Payer: Self-pay

## 2022-02-23 ENCOUNTER — Ambulatory Visit (AMBULATORY_SURGERY_CENTER): Payer: Medicaid Other | Admitting: Internal Medicine

## 2022-02-23 ENCOUNTER — Encounter: Payer: Self-pay | Admitting: Internal Medicine

## 2022-02-23 VITALS — BP 157/93 | HR 69 | Temp 97.3°F | Resp 14 | Ht 64.0 in | Wt 286.0 lb

## 2022-02-23 DIAGNOSIS — K219 Gastro-esophageal reflux disease without esophagitis: Secondary | ICD-10-CM

## 2022-02-23 DIAGNOSIS — K317 Polyp of stomach and duodenum: Secondary | ICD-10-CM

## 2022-02-23 DIAGNOSIS — K319 Disease of stomach and duodenum, unspecified: Secondary | ICD-10-CM

## 2022-02-23 DIAGNOSIS — K259 Gastric ulcer, unspecified as acute or chronic, without hemorrhage or perforation: Secondary | ICD-10-CM

## 2022-02-23 DIAGNOSIS — K921 Melena: Secondary | ICD-10-CM

## 2022-02-23 DIAGNOSIS — R1011 Right upper quadrant pain: Secondary | ICD-10-CM

## 2022-02-23 MED ORDER — OMEPRAZOLE 40 MG PO CPDR
40.0000 mg | DELAYED_RELEASE_CAPSULE | Freq: Two times a day (BID) | ORAL | 0 refills | Status: DC
  Filled 2022-02-23 – 2022-02-24 (×2): qty 112, 56d supply, fill #0
  Filled 2022-02-26: qty 60, 30d supply, fill #0
  Filled 2022-03-28: qty 52, 26d supply, fill #1

## 2022-02-23 MED ORDER — SODIUM CHLORIDE 0.9 % IV SOLN: 500.0000 mL | Freq: Once | INTRAVENOUS | Status: AC

## 2022-02-23 NOTE — Patient Instructions (Signed)
Read all of the handouts given to you by your recovery room nurse.  Take your medicine twice daily on an empty stomach.  Your RN made an office visit for you.  You may call to change it if necessary.  YOU HAD AN ENDOSCOPIC PROCEDURE TODAY AT [facility]:   Refer to the procedure report that was given to you for any specific questions about what was found during the examination.  If the procedure report does not answer your questions, please call your gastroenterologist to clarify.  If you requested that your care partner not be given the details of your procedure findings, then the procedure report has been included in a sealed envelope for you to review at your convenience later.  YOU SHOULD EXPECT: Some feelings of bloating in the abdomen. Passage of more gas than usual.  Walking can help get rid of the air that was put into your GI tract during the procedure and reduce the bloating.  Please Note:  You might notice some irritation and congestion in your nose or some drainage.  This is from the oxygen used during your procedure.  There is no need for concern and it should clear up in a day or so.  SYMPTOMS TO REPORT IMMEDIATELY:   Following upper endoscopy (EGD)  Vomiting of blood or coffee ground material  New chest pain or pain under the shoulder blades  Painful or persistently difficult swallowing  New shortness of breath  Fever of 100F or higher  Black, tarry-looking stools  For urgent or emergent issues, a gastroenterologist can be reached at any hour by calling [phone]. Do not use MyChart messaging for urgent concerns.    DIET:  We do recommend a small meal at first, but then you may proceed to your regular diet.  Drink plenty of fluids but you should avoid alcoholic beverages for 24 hours.  ACTIVITY:  You should plan to take it easy for the rest of today and you should NOT DRIVE or use heavy machinery until tomorrow (because of the sedation medicines used  during the test).    FOLLOW UP: Our staff will call the number listed on your records 24-72 hours following your procedure to check on you and address any questions or concerns that you may have regarding the information given to you following your procedure. If we do not reach you, we will leave a message.  We will attempt to reach you two times.  During this call, we will ask if you have developed any symptoms of COVID 19. If you develop any symptoms (ie: fever, flu-like symptoms, shortness of breath, cough etc.) before then, please call [phone].  If you test positive for Covid 19 in the 2 weeks post procedure, please call and report this information to [place].    If any biopsies were taken you will be contacted by phone or by letter within the next 1-3 weeks.  Please call us at [phone] if you have not heard about the biopsies in 3 weeks.    SIGNATURES/CONFIDENTIALITY: You and/or your care partner have signed paperwork which will be entered into your electronic medical record.  These signatures attest to the fact that that the information above on your After Visit Summary has been reviewed and is understood.  Full responsibility of the confidentiality of this discharge information lies with you and/or your care-partner.

## 2022-02-23 NOTE — Op Note (Signed)
[facility] Patient Name: Deborah Barnes Procedure Date: 02/23/2022 9:58 AM MRN: [id number] Endoscopist: Sonny Masters "Deborah Barnes ,  Age: 46 Referring MD:  Date of Birth: 03-15-1976 Gender: Female Account #: [id number] Procedure:                Upper GI endoscopy Indications:              Abdominal pain in the right upper quadrant, [place] Medicines:                Monitored Anesthesia Care Procedure:                Pre-Anesthesia Assessment:                           - Prior to the procedure, a History and Physical                            was performed, and patient medications and                            allergies were reviewed. The patient's tolerance of                            previous anesthesia was also reviewed. The risks                            and benefits of the procedure and the sedation                            options and risks were discussed with the patient.                            All questions were answered, and informed consent                            was obtained. Prior Anticoagulants: The patient has                            taken no previous anticoagulant or antiplatelet                            agents. ASA Grade Assessment: III - A patient with                            severe systemic disease. After reviewing the risks                            and benefits, the patient was deemed in                            satisfactory condition to undergo the procedure.                           After obtaining informed consent, the endoscope was  passed under direct vision. Throughout the                            procedure, the patient's blood pressure, pulse, and                            oxygen saturations were monitored continuously. The                            Endoscope was introduced through the mouth, and                            advanced to the second part of duodenum. The upper                             GI endoscopy was accomplished without difficulty.                            The patient tolerated the procedure well. Scope In: Scope Out: Findings:                 The examined esophagus was normal. Biopsies were                            taken with a cold forceps for histology.                           A single 3 mm sessile polyp with no bleeding and no                            stigmata of recent bleeding was found in the                            gastric body. The polyp was removed with a cold                            biopsy forceps. Resection and retrieval were                            complete.                           Multiple localized erosions with no bleeding and no                            stigmata of recent bleeding were found in the                            gastric antrum. Biopsies were taken with a cold                            forceps for histology.  The examined duodenum was normal. Biopsies were                            taken with a cold forceps for histology. Complications:            No immediate complications. Estimated Blood Loss:     Estimated blood loss was minimal. Impression:               - Normal esophagus. Biopsied.                           - A single gastric polyp. Resected and retrieved.                           - Erosive gastropathy with no bleeding and no                            stigmata of recent bleeding. Biopsied.                           - Normal examined duodenum. Biopsied. Recommendation:           - Discharge patient to home (with escort).                           - Await pathology results.                           - Use Prilosec (omeprazole) 40 mg PO BID for 8                            weeks.                           - Return to GI clinic in 6 weeks.                           - The findings and recommendations were discussed                            with the patient. Sonny Masters "Deborah Barnes,   02/23/2022 10:24:15 AM

## 2022-02-23 NOTE — Progress Notes (Signed)
Patient states that her l hip is hurting badly.  States that she has nerve damage.  Patient did arrive in admittng with a cane.  Patient to take ibuprofen when she goes  home.  She is a recovering addict.  Dr is aware.  Pt insists on dressing herself.  She does have her cane.

## 2022-02-23 NOTE — Progress Notes (Signed)
GASTROENTEROLOGY PROCEDURE H&P NOTE   Primary Care Physician: Bo Merino I, NP    Reason for Procedure:   Melena, RUQ ab pain, diarrhea  Plan:    EGD  Patient is appropriate for endoscopic procedure(s) in the ambulatory ([place]) setting.  The nature of the procedure, as well as the risks, benefits, and alternatives were carefully and thoroughly reviewed with the patient. Ample time for discussion and questions allowed. The patient understood, was satisfied, and agreed to proceed.     HPI: Deborah Barnes is a 46 y.o. female who presents for EGD for evaluation of melena, RUQ ab pain, diarrhea .  Patient was most recently seen in the Gastroenterology Clinic on 01/19/22.  No interval change in medical history since that appointment. Please refer to that note for full details regarding GI history and clinical presentation.   Past Medical History:  Diagnosis Date   Asthma    Back pain    Bipolar disorder ([place])    DX in the past but not currently be treated   Bulging disc    Complication of anesthesia 2014   anesthesia ? caused depression/suicidal ideation per pt.   COPD (chronic obstructive pulmonary disease) ([place])    De Quervain's tenosynovitis, right 10/2019   surgery   Degenerative disc disease    Depression    Depression with anxiety    DJD (degenerative joint disease)    right hip   GERD (gastroesophageal reflux disease)    Hepatitis C 08/2017   TX for HEP C   Hip pain, right    History of alcohol abuse    History of IBS    History of mixed drug abuse ([place])    Hypercholesterolemia    Hypertension    Hypoglycemia    Hypothyroidism    PTSD (post-traumatic stress disorder)    Sciatica    Sciatica 01/28/2022   Shortness of breath    Thyroid disease    Vitamin D deficiency 02/2020   Walker as ambulation aid     Past Surgical History:  Procedure Laterality Date   BIOPSY N/A 09/25/2014   Procedure: BIOPSY;  Surgeon: Daneil Dolin, MD;  [facility]: [facility];   Service: Endoscopy;  Laterality: N/A;  Gastric, Ascending Colon, Descending/Sigmoid Colon    CARPAL TUNNEL RELEASE Bilateral    CHOLECYSTECTOMY N/A 04/26/2013   Procedure: LAPAROSCOPIC CHOLECYSTECTOMY;  Surgeon: Jamesetta So, MD;  Location: [facility];  Service: General;  Laterality: N/A;   COLONOSCOPY WITH PROPOFOL N/A 09/25/2014   RMR: Pancolonic diverticulosis. multiple tubular adenomas, segmental biopsies negative. surveillance in 5 years   COLONOSCOPY WITH PROPOFOL N/A 01/16/2020   diverticulosis, two 4-6 mm polyps in sigmoid and splenic flexure, one 10 mm polyp in sigmoid Tubular adenomas, 3 year surveillance.   DORSAL COMPARTMENT RELEASE Right 10/30/2019   Procedure: RIGHT WRIST 1ST  DORSAL COMPARTMENT RELEASE (DEQUERVAIN);  Surgeon: Marybelle Killings, MD;  Location: [facility];  Service: Orthopedics;  Laterality: Right;   ESOPHAGOGASTRODUODENOSCOPY (EGD) WITH PROPOFOL N/A 09/25/2014   RMR: Normal esophagus. Abnormal gastric mucosa status post biopsy (negative H.pylori). Hiatal hernia.    ESOPHAGOGASTRODUODENOSCOPY (EGD) WITH PROPOFOL N/A 01/16/2020   Normal esophagus s/p dilation, normal stomach and duodenal bulb.    INCISIONAL HERNIA REPAIR N/A 10/04/2013   Procedure: HERNIA REPAIR INCISIONAL WITH MESH;  Surgeon: Jamesetta So, MD;  [facility];  Service: General;  Laterality: N/A;   INSERTION OF MESH N/A 10/04/2013   Procedure: INSERTION OF MESH;  Surgeon: Jeannette How  Arnoldo Morale, MD;  Location: [facility];  Service: General;  Laterality: N/A;   Venia Minks DILATION N/A 01/16/2020   Procedure: Keturah Shavers;  Surgeon: Daneil Dolin, MD;  [facility];  Service: Endoscopy;  Laterality: N/A;   POLYPECTOMY N/A 09/25/2014   Procedure: POLYPECTOMY;  Surgeon: Daneil Dolin, MD;  [facility];  Service: Endoscopy;  Laterality: N/A;  Cecal, Ascending Colon    POLYPECTOMY  01/16/2020   Procedure: POLYPECTOMY;  Surgeon: Daneil Dolin, MD;  [facility];  Service: Endoscopy;;   SHOULDER SURGERY  Left    TOOTH EXTRACTION  08/09/2019   TOTAL HIP ARTHROPLASTY Right 04/27/2020   Procedure: TOTAL HIP ARTHROPLASTY DIRECT ANTERIOR;  Surgeon: Marybelle Killings, MD;  Location: [facility];  Service: Orthopedics;  Laterality: Right;   TUBAL LIGATION     WISDOM TOOTH EXTRACTION      Prior to Admission medications   Medication Sig Start Date End Date Taking? Authorizing Provider  acetaminophen (TYLENOL) 500 MG tablet Take 500 mg by mouth every 6 (six) hours as needed.   Yes [provider]  blood glucose meter kit and supplies Dispense based on patient and insurance preference. Use up to four times daily as directed. (FOR ICD-10 E10.9, E11.9). 09/08/20  Yes Azzie Glatter, FNP  citalopram (CELEXA) 20 MG tablet Take 1 tablet (20 mg total) by mouth daily. 01/04/22 01/04/23 Yes Penn, Lunette Stands, NP  colestipol (COLESTID) 1 g tablet Take 2 tablets (2 g total) by mouth 2 (two) times daily. 01/19/22  Yes Sharyn Creamer, MD  hydrOXYzine (ATARAX) 10 MG tablet Take 1 tablet (10 mg total) by mouth 3 (three) times daily as needed. 01/04/22  Yes Penn, Lunette Stands, NP  levothyroxine (SYNTHROID) 50 MCG tablet Take 1 tablet (50 mcg total) by mouth daily before breakfast. 03/17/21 03/17/22 Yes Vevelyn Francois, NP  metFORMIN (GLUCOPHAGE-XR) 750 MG 24 hr tablet Take 1 tablet (750 mg total) by mouth daily with breakfast. 02/25/21 04/02/22 Yes Vevelyn Francois, NP  metoprolol tartrate (LOPRESSOR) 50 MG tablet Take 1 tablet (50 mg total) by mouth 2 (two) times daily. 12/28/21  Yes Passmore, Jake Church I, NP  omeprazole (PRILOSEC) 40 MG capsule Take 1 capsule (40 mg total) by mouth daily. 02/01/22  Yes Passmore, Jake Church I, NP  albuterol (PROVENTIL HFA) 108 (90 Base) MCG/ACT inhaler Inhale 1-2 puffs into the lungs every 6 (six) hours as needed for wheezing or shortness of breath. Patient not taking: Reported on 02/23/2022 03/17/21   Vevelyn Francois, NP  simethicone (MYLICON) 753 MG chewable tablet Chew 125 mg by mouth every 6 (six) hours as needed  for flatulence. Patient not taking: Reported on 02/23/2022    [provider]    Current Outpatient Medications  Medication Sig Dispense Refill   acetaminophen (TYLENOL) 500 MG tablet Take 500 mg by mouth every 6 (six) hours as needed.     blood glucose meter kit and supplies Dispense based on patient and insurance preference. Use up to four times daily as directed. (FOR ICD-10 E10.9, E11.9). 1 each 0   citalopram (CELEXA) 20 MG tablet Take 1 tablet (20 mg total) by mouth daily. 60 tablet 0   colestipol (COLESTID) 1 g tablet Take 2 tablets (2 g total) by mouth 2 (two) times daily. 120 tablet 11   hydrOXYzine (ATARAX) 10 MG tablet Take 1 tablet (10 mg total) by mouth 3 (three) times daily as needed. 90 tablet 2   levothyroxine (SYNTHROID) 50 MCG tablet  Take 1 tablet (50 mcg total) by mouth daily before breakfast. 90 tablet 3   metFORMIN (GLUCOPHAGE-XR) 750 MG 24 hr tablet Take 1 tablet (750 mg total) by mouth daily with breakfast. 90 tablet 3   metoprolol tartrate (LOPRESSOR) 50 MG tablet Take 1 tablet (50 mg total) by mouth 2 (two) times daily. 60 tablet 1   omeprazole (PRILOSEC) 40 MG capsule Take 1 capsule (40 mg total) by mouth daily. 30 capsule 0   albuterol (PROVENTIL HFA) 108 (90 Base) MCG/ACT inhaler Inhale 1-2 puffs into the lungs every 6 (six) hours as needed for wheezing or shortness of breath. (Patient not taking: Reported on 02/23/2022) 8.5 g 1   simethicone (MYLICON) 503 MG chewable tablet Chew 125 mg by mouth every 6 (six) hours as needed for flatulence. (Patient not taking: Reported on 02/23/2022)     Current Facility-Administered Medications  Medication Dose Route Frequency Provider Last Rate Last Admin   0.9 %  sodium chloride infusion  500 mL Intravenous Once Sharyn Creamer, MD        Allergies as of 02/23/2022 - Review Complete 02/23/2022  Allergen Reaction Noted   Clarithromycin Diarrhea 02/22/2012   Propoxyphene Itching 01/25/2018    Family History  Problem  Relation Age of Onset   Diabetes Father    Colon polyps Maternal Aunt    Colon cancer Maternal Grandmother    Heart disease Paternal Grandmother    Cancer Paternal Grandmother        esophageal   Hypertension Paternal Grandmother    GER disease Paternal Grandmother    Osteoporosis Paternal Grandmother     Social History   Socioeconomic History   Marital status: Divorced    Spouse name: Not on file   Number of children: 2   Years of education: Not on file   Highest education level: Not on file  Occupational History   Occupation: Radio broadcast assistant  Tobacco Use   Smoking status: Former    Packs/day: 0.50    Years: 27.00    Pack years: 13.50    Types: Cigarettes    Quit date: 08/24/2018    Years since quitting: 3.5   Smokeless tobacco: Never   Tobacco comments:    vape former user  Vaping Use   Vaping Use: Every day   Last attempt to quit: 08/24/2018   Substances: Nicotine  Substance and Sexual Activity   Alcohol use: Not Currently    Alcohol/week: 1.0 standard drink    Types: 1 Cans of beer per week    Comment: hx binge drinker. none since 01/23/18   Drug use: Not Currently    Types: Marijuana, Cocaine, Methamphetamines, Heroin    Comment: no cocaine, heroin, meth since 01/23/18. last marijuana use 01-23-18   Sexual activity: Not Currently    Birth control/protection: Surgical    Comment: tubal ligation  Other Topics Concern   Not on file  Social History Narrative   Not on file   Social Determinants of Health   Financial Resource Strain: Not on file  Food Insecurity: Not on file  Transportation Needs: Not on file  Physical Activity: Not on file  Stress: Not on file  Social Connections: Not on file  Intimate Partner Violence: Not on file    Physical Exam: Vital signs in last 24 hours: BP 136/86   Pulse 68   Temp (!) 97.3 F (36.3 C)   Ht _0  (1.626 m)   Wt 286 lb (129.7 kg)   SpO2 96%  BMI 49.09 kg/m  GEN: NAD EYE: Sclerae anicteric ENT: MMM CV:  Non-tachycardic Pulm: No increased WOB GI: Soft NEURO:  Alert & Oriented   [name], MD [facility]   02/23/2022 9:52 AM

## 2022-02-23 NOTE — Progress Notes (Signed)
Report to PACU, RN, vss, BBS= Clear.  

## 2022-02-23 NOTE — Progress Notes (Signed)
Called to room to assist during endoscopic procedure.  Patient ID and intended procedure confirmed with present staff. Received instructions for my participation in the procedure from the performing physician.  

## 2022-02-24 ENCOUNTER — Other Ambulatory Visit: Payer: Self-pay | Admitting: Nurse Practitioner

## 2022-02-24 ENCOUNTER — Telehealth: Payer: Self-pay | Admitting: *Deleted

## 2022-02-24 ENCOUNTER — Ambulatory Visit (INDEPENDENT_AMBULATORY_CARE_PROVIDER_SITE_OTHER): Payer: Self-pay | Admitting: Surgery

## 2022-02-24 ENCOUNTER — Encounter: Payer: Self-pay | Admitting: Surgery

## 2022-02-24 ENCOUNTER — Encounter: Payer: Self-pay | Admitting: Orthopaedic Surgery

## 2022-02-24 ENCOUNTER — Other Ambulatory Visit: Payer: Self-pay

## 2022-02-24 VITALS — BP 163/105 | HR 76 | Ht 64.0 in | Wt 286.0 lb

## 2022-02-24 DIAGNOSIS — M5416 Radiculopathy, lumbar region: Secondary | ICD-10-CM

## 2022-02-24 DIAGNOSIS — I10 Essential (primary) hypertension: Secondary | ICD-10-CM

## 2022-02-24 DIAGNOSIS — M545 Low back pain, unspecified: Secondary | ICD-10-CM

## 2022-02-24 NOTE — Telephone Encounter (Signed)
  Follow up Call-     02/23/2022    9:26 AM  Call back number  Post procedure Call Back phone  # [phone]  Permission to leave phone message Yes     Patient questions:  Do you have a fever, pain , or abdominal swelling? No. Pain Score  0 *  Have you tolerated food without any problems? Yes.    Have you been able to return to your normal activities? Yes.    Do you have any questions about your discharge instructions: Diet   No. Medications  No. Follow up visit  No.  Do you have questions or concerns about your Care? No.  Actions: * If pain score is 4 or above: No action needed, pain <4.
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
## 2022-02-25 ENCOUNTER — Other Ambulatory Visit: Payer: Self-pay

## 2022-02-25 MED ORDER — METOPROLOL TARTRATE 50 MG PO TABS
50.0000 mg | ORAL_TABLET | Freq: Two times a day (BID) | ORAL | 1 refills | Status: DC
  Filled 2022-02-25: qty 60, 30d supply, fill #0
  Filled 2022-03-28: qty 60, 30d supply, fill #1

## 2022-02-26 ENCOUNTER — Encounter: Payer: Self-pay | Admitting: Internal Medicine

## 2022-02-26 ENCOUNTER — Ambulatory Visit (HOSPITAL_COMMUNITY)
Admission: RE | Admit: 2022-02-26 | Discharge: 2022-02-26 | Disposition: A | Discharge status: Home / Self Care | Payer: Medicaid Other | Source: Ambulatory Visit | Attending: Surgery | Admitting: Surgery

## 2022-02-26 DIAGNOSIS — M545 Low back pain, unspecified: Secondary | ICD-10-CM | POA: Insufficient documentation

## 2022-02-27 ENCOUNTER — Encounter: Payer: Self-pay | Admitting: Orthopaedic Surgery

## 2022-02-28 ENCOUNTER — Other Ambulatory Visit: Payer: Self-pay

## 2022-02-28 ENCOUNTER — Encounter: Payer: Self-pay | Admitting: Internal Medicine

## 2022-02-28 ENCOUNTER — Ambulatory Visit (HOSPITAL_COMMUNITY): Payer: No Payment, Other | Admitting: Licensed Clinical Social Worker

## 2022-02-28 NOTE — Telephone Encounter (Signed)
Please advise on message below.  Wasn't sure if patient had been taken out of work.

## 2022-03-02 ENCOUNTER — Ambulatory Visit (INDEPENDENT_AMBULATORY_CARE_PROVIDER_SITE_OTHER): Payer: Self-pay | Admitting: Orthopaedic Surgery

## 2022-03-02 ENCOUNTER — Encounter: Payer: Self-pay | Admitting: Orthopaedic Surgery

## 2022-03-02 DIAGNOSIS — M48062 Spinal stenosis, lumbar region with neurogenic claudication: Secondary | ICD-10-CM

## 2022-03-02 DIAGNOSIS — M48061 Spinal stenosis, lumbar region without neurogenic claudication: Secondary | ICD-10-CM | POA: Insufficient documentation
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
## 2022-03-03 ENCOUNTER — Other Ambulatory Visit: Payer: Self-pay

## 2022-03-04 ENCOUNTER — Other Ambulatory Visit: Payer: Self-pay

## 2022-03-04 ENCOUNTER — Other Ambulatory Visit (HOSPITAL_COMMUNITY): Payer: Self-pay

## 2022-03-04 ENCOUNTER — Telehealth (HOSPITAL_COMMUNITY): Payer: Self-pay | Admitting: *Deleted

## 2022-03-04 NOTE — Telephone Encounter (Signed)
Patient called after lunch on Fri, she sees Dr Ronne Binning who doesn't work after 12 on fri. She is requesting her Celexa. She should have been out on 02/03/22. She does have a future appt with provider on 03/31/22. Will make request of Brittney NP for her to send in a new rx for the celexa.

## 2022-03-07 ENCOUNTER — Ambulatory Visit (HOSPITAL_COMMUNITY): Payer: Self-pay | Admitting: Licensed Clinical Social Worker

## 2022-03-07 ENCOUNTER — Other Ambulatory Visit: Payer: Self-pay

## 2022-03-07 ENCOUNTER — Other Ambulatory Visit (HOSPITAL_COMMUNITY): Payer: Self-pay | Admitting: Psychiatry

## 2022-03-07 MED ORDER — CITALOPRAM HYDROBROMIDE 20 MG PO TABS
20.0000 mg | ORAL_TABLET | Freq: Every day | ORAL | 2 refills | Status: DC
  Filled 2022-03-07: qty 30, 30d supply, fill #0

## 2022-03-07 NOTE — Telephone Encounter (Signed)
Medication refilled and sent to preferred pharmacy

## 2022-03-11 ENCOUNTER — Other Ambulatory Visit: Payer: Self-pay

## 2022-03-11 ENCOUNTER — Ambulatory Visit: Payer: Self-pay | Admitting: Nurse Practitioner

## 2022-03-11 ENCOUNTER — Encounter: Payer: Self-pay | Admitting: Nurse Practitioner

## 2022-03-11 ENCOUNTER — Other Ambulatory Visit: Payer: Self-pay | Admitting: Nurse Practitioner

## 2022-03-11 ENCOUNTER — Ambulatory Visit (INDEPENDENT_AMBULATORY_CARE_PROVIDER_SITE_OTHER): Payer: Self-pay | Admitting: Nurse Practitioner

## 2022-03-11 VITALS — BP 170/80 | HR 70 | Temp 97.8°F | Wt 288.6 lb

## 2022-03-11 DIAGNOSIS — R7303 Prediabetes: Secondary | ICD-10-CM

## 2022-03-11 DIAGNOSIS — E039 Hypothyroidism, unspecified: Secondary | ICD-10-CM

## 2022-03-11 DIAGNOSIS — M5442 Lumbago with sciatica, left side: Secondary | ICD-10-CM

## 2022-03-11 DIAGNOSIS — R739 Hyperglycemia, unspecified: Secondary | ICD-10-CM

## 2022-03-11 DIAGNOSIS — G8929 Other chronic pain: Secondary | ICD-10-CM | POA: Insufficient documentation

## 2022-03-11 DIAGNOSIS — M5441 Lumbago with sciatica, right side: Secondary | ICD-10-CM

## 2022-03-11 LAB — POCT GLYCOSYLATED HEMOGLOBIN (HGB A1C)
HbA1c POC (<> result, manual entry): 5.6 % (ref 4.0–5.6)
HbA1c, POC (controlled diabetic range): 5.6 % (ref 0.0–7.0)
HbA1c, POC (prediabetic range): 5.6 % — AB (ref 5.7–6.4)
Hemoglobin A1C: 5.6 % (ref 4.0–5.6)

## 2022-03-11 MED ORDER — TIZANIDINE HCL 4 MG PO TABS
4.0000 mg | ORAL_TABLET | Freq: Four times a day (QID) | ORAL | 0 refills | Status: DC | PRN
  Filled 2022-03-11: qty 30, 8d supply, fill #0

## 2022-03-11 MED ORDER — MELOXICAM 7.5 MG PO TABS
7.5000 mg | ORAL_TABLET | Freq: Every day | ORAL | 0 refills | Status: DC
  Filled 2022-03-11: qty 30, 30d supply, fill #0

## 2022-03-11 MED ORDER — METFORMIN HCL ER 750 MG PO TB24
750.0000 mg | ORAL_TABLET | Freq: Every day | ORAL | 3 refills | Status: AC
  Filled 2022-03-11: qty 30, 30d supply, fill #0
  Filled 2022-04-12: qty 30, 30d supply, fill #1
  Filled 2022-05-10: qty 30, 30d supply, fill #2
  Filled 2022-06-04: qty 30, 30d supply, fill #3
  Filled 2022-07-06: qty 30, 30d supply, fill #4
  Filled 2022-08-03: qty 30, 30d supply, fill #5
  Filled 2022-09-14: qty 30, 30d supply, fill #6
  Filled 2022-09-15: qty 30, 30d supply, fill #0
  Filled 2022-10-15: qty 30, 30d supply, fill #1
  Filled 2022-11-11: qty 30, 30d supply, fill #2

## 2022-03-12 LAB — COMPREHENSIVE METABOLIC PANEL
ALT: 11 IU/L (ref 0–32)
AST: 10 IU/L (ref 0–40)
Albumin/Globulin Ratio: 2 (ref 1.2–2.2)
Albumin: 4.3 g/dL (ref 3.8–4.8)
Alkaline Phosphatase: 71 IU/L (ref 44–121)
BUN/Creatinine Ratio: 21 (ref 9–23)
BUN: 13 mg/dL (ref 6–24)
Bilirubin Total: <0.2 mg/dL (ref 0.0–1.2)
CO2: 22 mmol/L (ref 20–29)
Calcium: 9.4 mg/dL (ref 8.7–10.2)
Chloride: 106 mmol/L (ref 96–106)
Creatinine, Ser: 0.62 mg/dL (ref 0.57–1.00)
Globulin, Total: 2.1 g/dL (ref 1.5–4.5)
Glucose: 94 mg/dL (ref 70–99)
Potassium: 4.3 mmol/L (ref 3.5–5.2)
Sodium: 142 mmol/L (ref 134–144)
Total Protein: 6.4 g/dL (ref 6.0–8.5)
eGFR: 110 mL/min/{1.73_m2} (ref >59)

## 2022-03-12 LAB — CBC
Hematocrit: 34 % (ref 34.0–46.6)
Hemoglobin: 11.6 g/dL (ref 11.1–15.9)
MCH: 29.7 pg (ref 26.6–33.0)
MCHC: 34.1 g/dL (ref 31.5–35.7)
MCV: 87 fL (ref 79–97)
Platelets: 351 10*3/uL (ref 150–450)
RBC: 3.9 x10E6/uL (ref 3.77–5.28)
RDW: 13.4 % (ref 11.7–15.4)
WBC: 6.2 10*3/uL (ref 3.4–10.8)

## 2022-03-12 LAB — TSH: TSH: 0.646 u[IU]/mL (ref 0.450–4.500)

## 2022-03-14 ENCOUNTER — Ambulatory Visit (INDEPENDENT_AMBULATORY_CARE_PROVIDER_SITE_OTHER): Payer: No Payment, Other | Admitting: Licensed Clinical Social Worker

## 2022-03-14 DIAGNOSIS — F3341 Major depressive disorder, recurrent, in partial remission: Secondary | ICD-10-CM | POA: Diagnosis not present

## 2022-03-15 ENCOUNTER — Encounter: Payer: Self-pay | Admitting: Internal Medicine

## 2022-03-21 ENCOUNTER — Other Ambulatory Visit: Payer: Self-pay | Admitting: Nurse Practitioner

## 2022-03-21 ENCOUNTER — Ambulatory Visit (HOSPITAL_COMMUNITY): Payer: Self-pay | Admitting: Licensed Clinical Social Worker

## 2022-03-21 DIAGNOSIS — E039 Hypothyroidism, unspecified: Secondary | ICD-10-CM

## 2022-03-23 ENCOUNTER — Other Ambulatory Visit: Payer: Self-pay | Admitting: Nurse Practitioner

## 2022-03-23 ENCOUNTER — Other Ambulatory Visit: Payer: Self-pay

## 2022-03-23 DIAGNOSIS — G8929 Other chronic pain: Secondary | ICD-10-CM

## 2022-03-23 MED ORDER — LEVOTHYROXINE SODIUM 50 MCG PO TABS
50.0000 ug | ORAL_TABLET | Freq: Every day | ORAL | 3 refills | Status: DC
  Filled 2022-03-23 – 2022-03-28 (×2): qty 90, 90d supply, fill #0

## 2022-03-23 NOTE — Telephone Encounter (Signed)
Can you have him do another one?

## 2022-03-24 ENCOUNTER — Other Ambulatory Visit: Payer: Self-pay

## 2022-03-24 ENCOUNTER — Other Ambulatory Visit: Payer: Self-pay | Admitting: Nurse Practitioner

## 2022-03-24 DIAGNOSIS — G8929 Other chronic pain: Secondary | ICD-10-CM

## 2022-03-24 MED ORDER — TIZANIDINE HCL 4 MG PO TABS
4.0000 mg | ORAL_TABLET | Freq: Four times a day (QID) | ORAL | 0 refills | Status: DC | PRN
  Filled 2022-03-24: qty 30, 8d supply, fill #0

## 2022-03-24 NOTE — Telephone Encounter (Signed)
Confirmed with pharmacy, no recent rx received.  Please advise on refill

## 2022-03-24 NOTE — Telephone Encounter (Signed)
I just resent the prescription for her. Thanks.

## 2022-03-28 ENCOUNTER — Other Ambulatory Visit: Payer: Self-pay

## 2022-03-29 ENCOUNTER — Other Ambulatory Visit: Payer: Self-pay

## 2022-03-31 ENCOUNTER — Other Ambulatory Visit: Payer: Self-pay

## 2022-03-31 ENCOUNTER — Encounter (HOSPITAL_COMMUNITY): Payer: Self-pay | Admitting: Psychiatry

## 2022-03-31 ENCOUNTER — Telehealth (INDEPENDENT_AMBULATORY_CARE_PROVIDER_SITE_OTHER): Payer: No Payment, Other | Admitting: Psychiatry

## 2022-03-31 DIAGNOSIS — F419 Anxiety disorder, unspecified: Secondary | ICD-10-CM | POA: Diagnosis not present

## 2022-03-31 MED ORDER — HYDROXYZINE HCL 10 MG PO TABS
10.0000 mg | ORAL_TABLET | Freq: Three times a day (TID) | ORAL | 3 refills | Status: DC | PRN
  Filled 2022-03-31 – 2022-05-15 (×2): qty 90, 30d supply, fill #0

## 2022-03-31 MED ORDER — CITALOPRAM HYDROBROMIDE 20 MG PO TABS
20.0000 mg | ORAL_TABLET | Freq: Every day | ORAL | 3 refills | Status: DC
  Filled 2022-03-31: qty 30, 30d supply, fill #0
  Filled 2022-04-29: qty 30, 30d supply, fill #1
  Filled 2022-05-29: qty 30, 30d supply, fill #2
  Filled 2022-06-27: qty 30, 30d supply, fill #3
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
## 2022-04-01 ENCOUNTER — Other Ambulatory Visit: Payer: Self-pay

## 2022-04-04 ENCOUNTER — Other Ambulatory Visit: Payer: Self-pay

## 2022-04-04 ENCOUNTER — Telehealth (INDEPENDENT_AMBULATORY_CARE_PROVIDER_SITE_OTHER): Payer: Self-pay | Admitting: Internal Medicine

## 2022-04-04 DIAGNOSIS — R197 Diarrhea, unspecified: Secondary | ICD-10-CM

## 2022-04-04 DIAGNOSIS — K259 Gastric ulcer, unspecified as acute or chronic, without hemorrhage or perforation: Secondary | ICD-10-CM

## 2022-04-04 DIAGNOSIS — R1011 Right upper quadrant pain: Secondary | ICD-10-CM

## 2022-04-04 DIAGNOSIS — K760 Fatty (change of) liver, not elsewhere classified: Secondary | ICD-10-CM

## 2022-04-04 MED ORDER — COLESTIPOL HCL 1 G PO TABS
2.0000 g | ORAL_TABLET | Freq: Two times a day (BID) | ORAL | 11 refills | Status: AC
  Filled 2022-04-04 – 2022-06-24 (×2): qty 120, 30d supply, fill #0
  Filled 2022-07-06 – 2022-07-19 (×3): qty 120, 30d supply, fill #1
  Filled 2022-11-01: qty 120, 30d supply, fill #2
  Filled 2022-12-06 (×3): qty 120, 30d supply, fill #0

## 2022-04-04 NOTE — Progress Notes (Signed)
Chief Complaint: Abdominal apin  HPI : 46 year old female with history of chronic HCV s/p treatment, IBS, asthma, COPD, PTSD presents for follow up of abdominal pain   Patient works at the [employer].  Interval History: She has not been seeing any melena. She occasional has ab pain that she attributes to a IBS flare up. The colestipol medication is helping with her abdominal pain. She is not taking the colestipol twice daily, but she is taking it has needed. She is on average having 3 BMs per day. She was promoted at work so her work is less stressful.   Current Outpatient Medications  Medication Sig Dispense Refill   acetaminophen (TYLENOL) 500 MG tablet Take 500 mg by mouth every 6 (six) hours as needed.     albuterol (PROVENTIL HFA) 108 (90 Base) MCG/ACT inhaler Inhale 1-2 puffs into the lungs every 6 (six) hours as needed for wheezing or shortness of breath. (Patient not taking: Reported on 02/23/2022) 8.5 g 1   blood glucose meter kit and supplies Dispense based on patient and insurance preference. Use up to four times daily as directed. (FOR ICD-10 E10.9, E11.9). 1 each 0   citalopram (CELEXA) 20 MG tablet Take 1 tablet (20 mg total) by mouth daily. 60 tablet 3   colestipol (COLESTID) 1 g tablet Take 2 tablets (2 g total) by mouth 2 (two) times daily. 120 tablet 11   hydrOXYzine (ATARAX) 10 MG tablet Take 1 tablet (10 mg total) by mouth 3 (three) times daily as needed. 90 tablet 3   levothyroxine (SYNTHROID) 50 MCG tablet Take 1 tablet (50 mcg total) by mouth daily before breakfast. 90 tablet 3   meloxicam (MOBIC) 7.5 MG tablet Take 1 tablet (7.5 mg total) by mouth daily. 30 tablet 0   metFORMIN (GLUCOPHAGE-XR) 750 MG 24 hr tablet Take 1 tablet (750 mg total) by mouth daily with breakfast. 90 tablet 3   metoprolol tartrate (LOPRESSOR) 50 MG tablet Take 1 tablet (50 mg total) by mouth 2 (two) times daily. 60 tablet 1   omeprazole (PRILOSEC) 40 MG capsule Take 1 capsule (40 mg total) by  mouth in the morning and at bedtime. 112 capsule 0   simethicone (MYLICON) 562 MG chewable tablet Chew 125 mg by mouth every 6 (six) hours as needed for flatulence. (Patient not taking: Reported on 02/23/2022)     tiZANidine (ZANAFLEX) 4 MG tablet Take 1 tablet (4 mg total) by mouth every 6 (six) hours as needed for muscle spasms. 30 tablet 0   Current Facility-Administered Medications  Medication Dose Route Frequency Provider Last Rate Last Admin   0.9 %  sodium chloride infusion  500 mL Intravenous Once Sharyn Creamer, MD       Review of Systems: All systems reviewed and negative except where noted in HPI.   Physical Exam: Not performed as part of telehealth encounter  Labs 02/2021: TSH nml  Labs 05/2021: CBC with mildly dec Hb of 11.9. CMP nml. Lipase nml.  Labs 08/2021: Nml LFTs. Neg ANA, ASMA, TTG IgA. Nml IgA and IgG.  Labs 09/2021: CMP, FT4, TSH all normal.  Labs 11/2021: CBC nml.  Labs 02/2022: CBC and CMP nml.   Ab U/S 06/30/21: IMPRESSION: 1. The common bile duct is dilated measuring 12 mm, which may reflect reservoir effect post cholecystectomy. Suggest correlation with laboratory values to exclude biliary obstruction. If clinical concern for biliary obstruction this could be further evaluated with MRCP with and without contrast. 2. The  echogenicity of the liver is increased. This is a nonspecific finding but is most commonly seen with fatty infiltration of the liver. There are no obvious focal liver lesions.  MRCP 09/30/21: IMPRESSION:  Mild biliary ductal dilatation, likely secondary to prior  cholecystectomy. No radiographic evidence of choledocholithiasis or  other obstructing etiology  Liver elastography 09/30/21: IMPRESSION:  ULTRASOUND LIVER:  Fatty liver.  ULTRASOUND HEPATIC ELASTOGRAPHY:  Median kPa: 3.0  Diagnostic category: High probability of being normal  EGD 09/25/14: Normal appearing patent tubular esophagus. Stomach empty. Small hiatal hernia. No ulcer or  infiltrative processes. Linear erythema and scattered erosions - antrum. Patent pylorus. Normal first and second portion of the duodenum. Biopsies of abnormal gastric mucosa taken for histologic study. Retroflexed views revealed a hiatal hernia, Retroflexed views revealed. The scope was then withdrawn from the patient and the procedure completed Path: Stomach, biopsy - BENIGN ANTRAL GASTRIC MUCOSA WITH MINIMAL CHRONIC INACTIVE INFLAMMATION. - WARTHIN-STARRY STAIN IS NEGATIVE FOR HELICOBACTER PYLORI. - NO INTESTINAL METAPLASIA, DYSPLASIA, OR MALIGNANCY.  Colonoscopy 09/25/14: ENDOSCOPIC IMPRESSION: Pancolonic diverticulosis. Multiple colonic polyps removed as described above. status post segmental biopsy Path: 2. Colon, polyp(s), cecal - TUBULAR ADENOMA (X1). - NO HIGH GRADE DYSPLASIA OR MALIGNANCY IDENTIFIED. 3. Colon, biopsy, ascending - BENIGN COLORECTAL MUCOSA. - ASSOCIATED BENIGN LYMPHOID AGGREGATE. - NO EVIDENCE OF SIGNIFICANT INFLAMMATION, DYSPLASIA OR MALIGNANCY. 4. Colon, polyp(s), ascending - TUBULAR ADENOMA (X1). - NO HIGH GRADE DYSPLASIA OR MALIGNANCY IDENTIFIED. 5. Colon, biopsy, descending/sigmoid - BENIGN COLORECTAL MUCOSA. - ASSOCIATED BENIGN LYMPHOID AGGREGATE. - NO EVIDENCE OF SIGNIFICANT INFLAMMATION, DYSPLASIA OR MALIGNANCY  EGD 01/16/20: - Normal esophagus. Dilated. (Did not help) - Normal stomach. - Normal duodenal bulb and second portion of the duodenum. - No specimens collected.  Colonoscopy 01/16/20: The perianal and digital rectal examinations were normal. Scattered small and large-mouthed diverticula were found in the entire colon. Two semi-pedunculated polyps were found in the sigmoid colon and splenic flexure. The polyps were 4 to 6 mm in size. These polyps were removed with a cold snare. Resection and retrieval were complete. A 10 mm polyp was found in the sigmoid colon. The polyp was pedunculated. The polyp was removed with a hot snare. Resection and  retrieval were complete. Path: A. COLON, SPLENIC FLEXURE POLYPECTOMY:  - Tubular adenoma(s).  - High grade dysplasia is not identified.  B. COLON, SIGMOID, POLYPECTOMY:  - Tubular adenoma(s).  - High grade dysplasia is not identified.  - The cauterized tissue edge appears negative for glandular dysplasia.   EGD 02/23/22: - Normal esophagus. Biopsied. - A single gastric polyp. Resected and retrieved. - Erosive gastropathy with no bleeding and no stigmata of recent bleeding. Biopsied. - Normal examined duodenum. Biopsied. Path: 1. Surgical [P], 2nd portion of duodenum - DUODENAL MUCOSA WITH NO SPECIFIC HISTOPATHOLOGIC CHANGES - NEGATIVE FOR INCREASED INTRAEPITHELIAL LYMPHOCYTES OR VILLOUS ARCHITECTURAL CHANGES 2. Surgical [P], gastric antrum and gastric body - GASTRIC ANTRAL MUCOSA WITH NONSPECIFIC REACTIVE GASTROPATHY - GASTRIC OXYNTIC MUCOSA WITH NO SPECIFIC HISTOPATHOLOGIC CHANGES - HELICOBACTER PYLORI-LIKE ORGANISMS ARE NOT IDENTIFIED ON ROUTINE H&E STAIN 3. Surgical [P], gastric body polyp, polyp (1) - FUNDIC GLAND POLYP - NEGATIVE FOR DYSPLASIA 4. Surgical [P], mid esophagus and distal esophagus - ESOPHAGEAL SQUAMOUS MUCOSA WITH MILD VASCULAR CONGESTION, AND FOCAL SQUAMOUS BALLOONING, SUGGESTIVE OF REFLUX ESOPHAGITIS - NEGATIVE FOR INCREASED INTRAEPITHELIAL EOSINOPHILS  ASSESSMENT AND PLAN:  RUQ ab pain Diarrhea IBS GERD History of gastric erosions Fatty liver, no significant fibrosis on last elastography HCV s/p treatment Patient is overall doing well.  She has responded well to the colestipol, which has helped with some of her abdominal pain. Patient was seen on last EGD to have some gastric erosions. Will have her complete her 8 week course of PPI BID therapy, and then she can drop back down to QD therapy for control of GERD. - Low FODMAP diet - Cont omeprazole 40 mg for 2 weeks, then drop down to 40 mg QD - Cont colestipol 2 mg BID  - Encourage weight loss and avoiding  NSAID use if possible - Colonoscopy next due 12/2022 - RTC in 6 months  [name], MD

## 2022-04-04 NOTE — Patient Instructions (Addendum)
If you are age 47 or older, your body mass index should be between 23-30. Your There is no height or weight on file to calculate BMI. If this is out of the aforementioned range listed, please consider follow up with your Primary Care Provider.  If you are age 18 or younger, your body mass index should be between 19-25. Your There is no height or weight on file to calculate BMI. If this is out of the aformentioned range listed, please consider follow up with your Primary Care Provider.   Continue Omeprazole twice daily for two weeks and then drop to once daily.   Follow up in 6 months.  The [facility] providers would like to encourage you to use [facility] to communicate with providers for non-urgent requests or questions.  Due to long hold times on the telephone, sending your provider a message by [facility] may be a faster and more efficient way to get a response.  Please allow 48 business hours for a response.  Please remember that this is for non-urgent requests.   Thank you for entrusting me with your care and for choosing [facility], Dr. [name]

## 2022-04-05 ENCOUNTER — Other Ambulatory Visit: Payer: Self-pay | Admitting: Nurse Practitioner

## 2022-04-05 DIAGNOSIS — G8929 Other chronic pain: Secondary | ICD-10-CM

## 2022-04-06 ENCOUNTER — Ambulatory Visit (INDEPENDENT_AMBULATORY_CARE_PROVIDER_SITE_OTHER): Payer: No Payment, Other | Admitting: Licensed Clinical Social Worker

## 2022-04-06 DIAGNOSIS — F3341 Major depressive disorder, recurrent, in partial remission: Secondary | ICD-10-CM | POA: Diagnosis not present
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
## 2022-04-12 ENCOUNTER — Other Ambulatory Visit: Payer: Self-pay

## 2022-04-12 NOTE — Telephone Encounter (Signed)
Good morning,   Patient is requesting a refill for Mobic.  She has an appt with you scheduled on 06/13/2022.   She will also run out of levothyroxine before this appt as well. Can this be sent in with the mobic.   Please advise.

## 2022-04-13 ENCOUNTER — Other Ambulatory Visit: Payer: Self-pay | Admitting: Nurse Practitioner

## 2022-04-13 ENCOUNTER — Other Ambulatory Visit: Payer: Self-pay

## 2022-04-13 DIAGNOSIS — G8929 Other chronic pain: Secondary | ICD-10-CM

## 2022-04-13 DIAGNOSIS — E039 Hypothyroidism, unspecified: Secondary | ICD-10-CM

## 2022-04-13 MED ORDER — LEVOTHYROXINE SODIUM 50 MCG PO TABS
50.0000 ug | ORAL_TABLET | Freq: Every day | ORAL | 0 refills | Status: DC
  Filled 2022-04-13: qty 90, 90d supply, fill #0
  Filled 2022-06-27: qty 30, 30d supply, fill #0
  Filled 2022-07-16 – 2022-07-23 (×3): qty 30, 30d supply, fill #1
  Filled 2022-08-24: qty 30, 30d supply, fill #2

## 2022-04-13 MED ORDER — MELOXICAM 7.5 MG PO TABS
7.5000 mg | ORAL_TABLET | Freq: Every day | ORAL | 2 refills | Status: DC
  Filled 2022-04-13: qty 30, 30d supply, fill #0
  Filled 2022-05-10: qty 30, 30d supply, fill #1
  Filled 2022-06-05: qty 30, 30d supply, fill #2

## 2022-04-15 ENCOUNTER — Other Ambulatory Visit: Payer: Self-pay

## 2022-04-18 ENCOUNTER — Encounter: Payer: Self-pay | Admitting: Orthopaedic Surgery

## 2022-04-22 ENCOUNTER — Other Ambulatory Visit: Payer: Self-pay | Admitting: Nurse Practitioner

## 2022-04-22 ENCOUNTER — Other Ambulatory Visit: Payer: Self-pay

## 2022-04-22 DIAGNOSIS — G8929 Other chronic pain: Secondary | ICD-10-CM

## 2022-04-22 MED ORDER — TIZANIDINE HCL 4 MG PO TABS
4.0000 mg | ORAL_TABLET | Freq: Three times a day (TID) | ORAL | 0 refills | Status: AC | PRN
  Filled 2022-04-22: qty 60, 20d supply, fill #0

## 2022-04-24 ENCOUNTER — Encounter: Payer: Self-pay | Admitting: Internal Medicine

## 2022-04-25 ENCOUNTER — Other Ambulatory Visit: Payer: Self-pay

## 2022-04-26 ENCOUNTER — Other Ambulatory Visit: Payer: Self-pay | Admitting: Internal Medicine

## 2022-04-26 ENCOUNTER — Other Ambulatory Visit: Payer: Self-pay

## 2022-04-26 ENCOUNTER — Other Ambulatory Visit: Payer: Self-pay | Admitting: Nurse Practitioner

## 2022-04-26 DIAGNOSIS — K259 Gastric ulcer, unspecified as acute or chronic, without hemorrhage or perforation: Secondary | ICD-10-CM

## 2022-04-26 DIAGNOSIS — I10 Essential (primary) hypertension: Secondary | ICD-10-CM

## 2022-04-26 DIAGNOSIS — R1011 Right upper quadrant pain: Secondary | ICD-10-CM

## 2022-04-26 DIAGNOSIS — K921 Melena: Secondary | ICD-10-CM

## 2022-04-27 ENCOUNTER — Other Ambulatory Visit: Payer: Self-pay

## 2022-04-27 MED ORDER — METOPROLOL TARTRATE 50 MG PO TABS
50.0000 mg | ORAL_TABLET | Freq: Two times a day (BID) | ORAL | 1 refills | Status: DC
  Filled 2022-04-27: qty 60, 30d supply, fill #0
  Filled 2022-05-29: qty 60, 30d supply, fill #1

## 2022-04-29 ENCOUNTER — Other Ambulatory Visit: Payer: Self-pay | Admitting: Internal Medicine

## 2022-04-29 ENCOUNTER — Other Ambulatory Visit: Payer: Self-pay

## 2022-04-29 DIAGNOSIS — K259 Gastric ulcer, unspecified as acute or chronic, without hemorrhage or perforation: Secondary | ICD-10-CM

## 2022-04-29 DIAGNOSIS — R1011 Right upper quadrant pain: Secondary | ICD-10-CM

## 2022-04-29 DIAGNOSIS — K921 Melena: Secondary | ICD-10-CM

## 2022-04-29 MED ORDER — OMEPRAZOLE 40 MG PO CPDR
40.0000 mg | DELAYED_RELEASE_CAPSULE | Freq: Two times a day (BID) | ORAL | 0 refills | Status: DC
  Filled 2022-04-29: qty 112, 56d supply, fill #0

## 2022-05-02 ENCOUNTER — Other Ambulatory Visit: Payer: Self-pay

## 2022-05-10 ENCOUNTER — Other Ambulatory Visit: Payer: Self-pay

## 2022-05-15 ENCOUNTER — Encounter (INDEPENDENT_AMBULATORY_CARE_PROVIDER_SITE_OTHER): Payer: Self-pay

## 2022-05-16 ENCOUNTER — Other Ambulatory Visit: Payer: Self-pay

## 2022-05-18 ENCOUNTER — Other Ambulatory Visit: Payer: Self-pay

## 2022-05-25 ENCOUNTER — Ambulatory Visit (INDEPENDENT_AMBULATORY_CARE_PROVIDER_SITE_OTHER): Payer: No Payment, Other | Admitting: Licensed Clinical Social Worker

## 2022-05-25 DIAGNOSIS — F3341 Major depressive disorder, recurrent, in partial remission: Secondary | ICD-10-CM

## 2022-05-25 NOTE — Progress Notes (Signed)
Virtual Visit via Video Note  I connected with Mckynleigh Z Calzadilla on 05/25/22 at  8:00 AM EDT by a video enabled telemedicine application and verified that I am speaking with the correct person using two identifiers.  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 The patient expressed understanding and agreed to proceed.   I discussed the assessment and treatment plan with the patient. The patient was provided an opportunity to ask questions and all were answered. The patient agreed with the plan and demonstrated an understanding of the instructions.   The patient was advised to call back or seek an in-person evaluation if the symptoms worsen or if the condition fails to improve as anticipated.  I provided 39 minutes of non-face-to-face time during this encounter.   Heron Nay, LCSWA    THERAPIST PROGRESS NOTE  Session Time: 39 minutes  Participation Level: Active  Behavioral Response: CasualAlertEuthymic  Type of Therapy: Individual Therapy  Treatment Goals addressed: coping  ProgressTowards Goals: Progressing  Interventions: Strength-based and Supportive  Summary: Anaya Z Sonoda is a 46 y.o. female who presents for f/u with this cln. She connects on time and maintains good eye contact throughout the session. She states her pain has been on and off but has mostly improved. She states her mood has been "pretty good." She reports she got a new sponsor and that has been going well. She states her previous sponsor dropped her because "she didn't know how to sponsor someone with chronic pain and who couldn't attend meetings consistently." She states her previous sponsor did not like the fact that pt did not attend five meetings per week. She states she has worked through her feelings surrounding this issue and is very happy with  her new sponsor. She states she has been attending virtual meetings mostly. She states her son in [place] has been sober for one year and that she still has not heard from her other son. She reports there was a recent death in the family and she wonders how it affects her youngest son. She then shares what led to her oldest son having to leave the area and speculates that this could be why her youngest son isn't speaking to her. She discusses her feelings related to her sons and how she previously hated herself. "I've got a good life. I don't want to hate myself." She shares that she is still working on forgiving herself. She then discusses one of her closest friends who is struggling with agoraphobia and who recently stopped methadone. She is receptive to feedback from cln.  Suicidal/Homicidal: Nowithout intent/plan  Therapist Response: Cln assessed for current stressors, symptoms, and safety since last session. Cln utilized active listening and validation to assist with processing. Cln emphasized pt strengths and praised pt's continued reframing and optimism. Cln scheduled follow-up appointment and confirmed pt's availability and preferred method of service delivery (virtual).  Plan: Return again in 7 weeks (next available appt).  Diagnosis: MDD (major depressive disorder), recurrent, in partial remission ([place])  Collaboration of Care: Other none required for this visit.  Patient/Guardian was advised Release of Information must be obtained prior to any record release in order to collaborate their care with an outside provider. Patient/Guardian was advised if they have not already done so to contact the registration department to sign all necessary forms in order for [place] to release information  regarding their care.   Consent: Patient/Guardian gives verbal consent for treatment and assignment of benefits for services provided during this visit. Patient/Guardian expressed understanding and agreed to  proceed.   Heron Nay, LCSWA 05/25/2022

## 2022-05-30 ENCOUNTER — Other Ambulatory Visit: Payer: Self-pay

## 2022-06-04 ENCOUNTER — Other Ambulatory Visit: Payer: Self-pay | Admitting: Nurse Practitioner

## 2022-06-04 DIAGNOSIS — G8929 Other chronic pain: Secondary | ICD-10-CM

## 2022-06-06 ENCOUNTER — Other Ambulatory Visit: Payer: Self-pay

## 2022-06-06 ENCOUNTER — Other Ambulatory Visit: Payer: Self-pay | Admitting: Nurse Practitioner

## 2022-06-06 MED ORDER — TIZANIDINE HCL 4 MG PO TABS
4.0000 mg | ORAL_TABLET | Freq: Three times a day (TID) | ORAL | 0 refills | Status: AC | PRN
  Filled 2022-06-06: qty 90, 30d supply, fill #0

## 2022-06-13 ENCOUNTER — Encounter: Payer: Self-pay | Admitting: Nurse Practitioner

## 2022-06-13 ENCOUNTER — Other Ambulatory Visit: Payer: Self-pay

## 2022-06-13 ENCOUNTER — Ambulatory Visit (INDEPENDENT_AMBULATORY_CARE_PROVIDER_SITE_OTHER): Payer: 59 | Admitting: Nurse Practitioner

## 2022-06-13 VITALS — BP 145/102 | HR 81 | Temp 97.2°F | Ht 64.0 in | Wt 293.4 lb

## 2022-06-13 DIAGNOSIS — R7303 Prediabetes: Secondary | ICD-10-CM | POA: Diagnosis not present

## 2022-06-13 DIAGNOSIS — N959 Unspecified menopausal and perimenopausal disorder: Secondary | ICD-10-CM

## 2022-06-13 DIAGNOSIS — L309 Dermatitis, unspecified: Secondary | ICD-10-CM

## 2022-06-13 DIAGNOSIS — Z6841 Body Mass Index (BMI) 40.0 and over, adult: Secondary | ICD-10-CM

## 2022-06-13 DIAGNOSIS — M5442 Lumbago with sciatica, left side: Secondary | ICD-10-CM

## 2022-06-13 DIAGNOSIS — F603 Borderline personality disorder: Secondary | ICD-10-CM

## 2022-06-13 DIAGNOSIS — G8929 Other chronic pain: Secondary | ICD-10-CM

## 2022-06-13 DIAGNOSIS — M5441 Lumbago with sciatica, right side: Secondary | ICD-10-CM

## 2022-06-13 LAB — POCT GLYCOSYLATED HEMOGLOBIN (HGB A1C)
HbA1c POC (<> result, manual entry): 5.7 % (ref 4.0–5.6)
HbA1c, POC (controlled diabetic range): 5.7 % (ref 0.0–7.0)
HbA1c, POC (prediabetic range): 5.7 % (ref 5.7–6.4)
Hemoglobin A1C: 5.7 % — AB (ref 4.0–5.6)

## 2022-06-13 MED ORDER — MELOXICAM 15 MG PO TABS
15.0000 mg | ORAL_TABLET | Freq: Every day | ORAL | 0 refills | Status: AC
  Filled 2022-06-13: qty 30, 30d supply, fill #0

## 2022-06-13 MED ORDER — TRIAMCINOLONE ACETONIDE 0.1 % EX CREA
1.0000 | TOPICAL_CREAM | Freq: Two times a day (BID) | CUTANEOUS | 0 refills | Status: AC
  Filled 2022-06-13: qty 30, 15d supply, fill #0

## 2022-06-13 NOTE — Assessment & Plan Note (Signed)
-   POCT glycosylated hemoglobin (Hb A1C)  2. Dermatitis  - triamcinolone cream (KENALOG) 0.1 %; Apply 1 Application topically 2 (two) times daily.  Dispense: 30 g; Refill: 0  3. Chronic bilateral low back pain with bilateral sciatica  - meloxicam (MOBIC) 15 MG tablet; Take 1 tablet (15 mg total) by mouth daily.  Dispense: 30 tablet; Refill: 0  4. Class 3 severe obesity due to excess calories without serious comorbidity with body mass index (BMI) of 50.0 to 59.9 in adult (HCC)  - Amb Ref to Medical Weight Management  5. Premenopausal patient  - Ambulatory referral to Obstetrics / Gynecology  6. Emotional instability ([place])  - Ambulatory referral to Obstetrics / Gynecology  Follow up:  Follow up in 3 months or sooner if needed

## 2022-06-13 NOTE — Patient Instructions (Signed)
1. Prediabetes  - POCT glycosylated hemoglobin (Hb A1C)  2. Dermatitis  - triamcinolone cream (KENALOG) 0.1 %; Apply 1 Application topically 2 (two) times daily.  Dispense: 30 g; Refill: 0  3. Chronic bilateral low back pain with bilateral sciatica  - meloxicam (MOBIC) 15 MG tablet; Take 1 tablet (15 mg total) by mouth daily.  Dispense: 30 tablet; Refill: 0  4. Class 3 severe obesity due to excess calories without serious comorbidity with body mass index (BMI) of 50.0 to 59.9 in adult (HCC)  - Amb Ref to Medical Weight Management  5. Premenopausal patient  - Ambulatory referral to Obstetrics / Gynecology  6. Emotional instability ([place])  - Ambulatory referral to Obstetrics / Gynecology  Follow up:  Follow up in 3 months or sooner if needed

## 2022-06-13 NOTE — Progress Notes (Signed)
_0  ID: Deborah Barnes, female    DOB: 01/05/1976, 46 y.o.   MRN: [id number]  Chief Complaint  Patient presents with   Follow-up    Pt is here for follow up visit. Pt is concern with her hormones pt states she very emotional, perimenopausal pt is requesting a dosage increase on meloxicam    Referring provider: No ref. provider found   HPI  Patient presents today for a follow up visit. Over all doing well. She states that she has been having some issues with premenopausal symptoms. We will refer her to GYN. She is experiencing emotional instability possibly related to hormonal changes. She has also been eating more. She would like a referral for weight management. Denies f/c/s, n/v/d, hemoptysis, PND, leg swelling Denies chest pain or edema    Allergies  Allergen Reactions   Clarithromycin Diarrhea    abd pain   Propoxyphene Itching    Immunization History  Administered Date(s) Administered   Influenza,inj,Quad PF,6+ Mos 05/29/2019   Pneumococcal Polysaccharide-23 04/26/2013   Tdap 11/01/2018    Past Medical History:  Diagnosis Date   Asthma    Back pain    Bipolar disorder (HCC)    DX in the past but not currently be treated   Bulging disc    Complication of anesthesia 2014   anesthesia ? caused depression/suicidal ideation per pt.   COPD (chronic obstructive pulmonary disease) (HCC)    De Quervain's tenosynovitis, right 10/2019   surgery   Degenerative disc disease    Depression    Depression with anxiety    DJD (degenerative joint disease)    right hip   GERD (gastroesophageal reflux disease)    Hepatitis C 08/2017   TX for HEP C   Hip pain, right    History of alcohol abuse    History of IBS    History of mixed drug abuse ([place])    Hypercholesterolemia    Hypertension    Hypoglycemia    Hypothyroidism    PTSD (post-traumatic stress disorder)    Sciatica    Sciatica 01/28/2022   Shortness of breath    Thyroid disease    Vitamin D deficiency  02/2020   Walker as ambulation aid     Tobacco History: Social History   Tobacco Use  Smoking Status Former   Packs/day: 0.50   Years: 27.00   Total pack years: 13.50   Types: Cigarettes   Quit date: 08/24/2018   Years since quitting: 3.8  Smokeless Tobacco Never  Tobacco Comments   vape former user   Counseling given: Not Answered Tobacco comments: vape former user   Outpatient Encounter Medications as of 06/13/2022  Medication Sig   acetaminophen (TYLENOL) 500 MG tablet Take 500 mg by mouth every 6 (six) hours as needed.   albuterol (PROVENTIL HFA) 108 (90 Base) MCG/ACT inhaler Inhale 1-2 puffs into the lungs every 6 (six) hours as needed for wheezing or shortness of breath.   blood glucose meter kit and supplies Dispense based on patient and insurance preference. Use up to four times daily as directed. (FOR ICD-10 E10.9, E11.9).   citalopram (CELEXA) 20 MG tablet Take 1 tablet (20 mg total) by mouth daily.   colestipol (COLESTID) 1 g tablet Take 2 tablets (2 g total) by mouth 2 (two) times daily.   hydrOXYzine (ATARAX) 10 MG tablet Take 1 tablet (10 mg total) by mouth 3 (three) times daily as needed.   levothyroxine (SYNTHROID) 50 MCG tablet Take 1 tablet (  50 mcg total) by mouth daily before breakfast.   meloxicam (MOBIC) 15 MG tablet Take 1 tablet (15 mg total) by mouth daily.   metFORMIN (GLUCOPHAGE-XR) 750 MG 24 hr tablet Take 1 tablet (750 mg total) by mouth daily with breakfast.   metoprolol tartrate (LOPRESSOR) 50 MG tablet Take 1 tablet (50 mg total) by mouth 2 (two) times daily.   omeprazole (PRILOSEC) 40 MG capsule Take 1 capsule (40 mg total) by mouth in the morning and at bedtime.   tiZANidine (ZANAFLEX) 4 MG tablet Take 1 tablet (4 mg total) by mouth every 8 (eight) hours as needed for muscle spasms.   triamcinolone cream (KENALOG) 0.1 % Apply topically 2 (two) times daily.   [DISCONTINUED] meloxicam (MOBIC) 7.5 MG tablet Take 1 tablet (7.5 mg total) by mouth daily.    simethicone (MYLICON) 762 MG chewable tablet Chew 125 mg by mouth every 6 (six) hours as needed for flatulence. (Patient not taking: Reported on 02/23/2022)   Facility-Administered Encounter Medications as of 06/13/2022  Medication   0.9 %  sodium chloride infusion     Review of Systems  Review of Systems  Constitutional: Negative.   HENT: Negative.    Cardiovascular: Negative.   Gastrointestinal: Negative.   Allergic/Immunologic: Negative.   Neurological: Negative.   Psychiatric/Behavioral: Negative.         Physical Exam  BP (!) 145/102   Pulse 81   Temp (!) 97.2 F (36.2 C)   Ht _0  (1.626 m)   Wt 293 lb 6 oz (133.1 kg)   SpO2 100%   BMI 50.36 kg/m   Wt Readings from Last 5 Encounters:  06/13/22 293 lb 6 oz (133.1 kg)  03/11/22 288 lb 9.6 oz (130.9 kg)  02/24/22 286 lb (129.7 kg)  02/23/22 286 lb (129.7 kg)  02/01/22 275 lb (124.7 kg)     Physical Exam Vitals and nursing note reviewed.  Constitutional:      General: She is not in acute distress.    Appearance: She is well-developed.  Cardiovascular:     Rate and Rhythm: Normal rate and regular rhythm.  Pulmonary:     Effort: Pulmonary effort is normal.     Breath sounds: Normal breath sounds.  Neurological:     Mental Status: She is alert and oriented to person, place, and time.      Assessment & Plan:   Prediabetes - POCT glycosylated hemoglobin (Hb A1C)  2. Dermatitis  - triamcinolone cream (KENALOG) 0.1 %; Apply 1 Application topically 2 (two) times daily.  Dispense: 30 g; Refill: 0  3. Chronic bilateral low back pain with bilateral sciatica  - meloxicam (MOBIC) 15 MG tablet; Take 1 tablet (15 mg total) by mouth daily.  Dispense: 30 tablet; Refill: 0  4. Class 3 severe obesity due to excess calories without serious comorbidity with body mass index (BMI) of 50.0 to 59.9 in adult (HCC)  - Amb Ref to Medical Weight Management  5. Premenopausal patient  - Ambulatory referral to  Obstetrics / Gynecology  6. Emotional instability ([place])  - Ambulatory referral to Obstetrics / Gynecology  Follow up:  Follow up in 3 months or sooner if needed     Fenton Foy, NP 06/13/2022

## 2022-06-15 ENCOUNTER — Encounter: Payer: Self-pay | Admitting: Nurse Practitioner

## 2022-06-16 ENCOUNTER — Other Ambulatory Visit: Payer: Self-pay

## 2022-06-23 ENCOUNTER — Emergency Department (HOSPITAL_COMMUNITY): Payer: 59

## 2022-06-23 ENCOUNTER — Emergency Department (HOSPITAL_COMMUNITY)
Admission: EM | Admit: 2022-06-23 | Discharge: 2022-06-23 | Disposition: A | Discharge status: Home / Self Care | Payer: 59 | Attending: Emergency Medicine | Admitting: Emergency Medicine

## 2022-06-23 ENCOUNTER — Encounter (HOSPITAL_COMMUNITY): Payer: Self-pay

## 2022-06-23 DIAGNOSIS — I10 Essential (primary) hypertension: Secondary | ICD-10-CM | POA: Insufficient documentation

## 2022-06-23 DIAGNOSIS — Z79899 Other long term (current) drug therapy: Secondary | ICD-10-CM | POA: Insufficient documentation

## 2022-06-23 DIAGNOSIS — R002 Palpitations: Secondary | ICD-10-CM | POA: Insufficient documentation

## 2022-06-23 LAB — URINALYSIS, ROUTINE W REFLEX MICROSCOPIC
Bilirubin Urine: NEGATIVE
Glucose, UA: NEGATIVE mg/dL
Ketones, ur: NEGATIVE mg/dL
Leukocytes,Ua: NEGATIVE
Nitrite: NEGATIVE
Protein, ur: NEGATIVE mg/dL
Specific Gravity, Urine: 1.017 (ref 1.005–1.030)
pH: 5 (ref 5.0–8.0)

## 2022-06-23 LAB — BASIC METABOLIC PANEL
Anion gap: 8 (ref 5–15)
BUN: 17 mg/dL (ref 6–20)
CO2: 24 mmol/L (ref 22–32)
Calcium: 9.3 mg/dL (ref 8.9–10.3)
Chloride: 106 mmol/L (ref 98–111)
Creatinine, Ser: 0.71 mg/dL (ref 0.44–1.00)
GFR, Estimated: >60 mL/min (ref >60)
Glucose, Bld: 91 mg/dL (ref 70–99)
Potassium: 4 mmol/L (ref 3.5–5.1)
Sodium: 138 mmol/L (ref 135–145)

## 2022-06-23 LAB — CBC WITH DIFFERENTIAL/PLATELET
Abs Immature Granulocytes: 0.02 10*3/uL (ref 0.00–0.07)
Basophils Absolute: 0 10*3/uL (ref 0.0–0.1)
Basophils Relative: 0 %
Eosinophils Absolute: 0.3 10*3/uL (ref 0.0–0.5)
Eosinophils Relative: 3 %
HCT: 35 % — ABNORMAL LOW (ref 36.0–46.0)
Hemoglobin: 11.4 g/dL — ABNORMAL LOW (ref 12.0–15.0)
Immature Granulocytes: 0 %
Lymphocytes Relative: 34 %
Lymphs Abs: 3.1 10*3/uL (ref 0.7–4.0)
MCH: 29 pg (ref 26.0–34.0)
MCHC: 32.6 g/dL (ref 30.0–36.0)
MCV: 89.1 fL (ref 80.0–100.0)
Monocytes Absolute: 0.6 10*3/uL (ref 0.1–1.0)
Monocytes Relative: 7 %
Neutro Abs: 5 10*3/uL (ref 1.7–7.7)
Neutrophils Relative %: 56 %
Platelets: 369 10*3/uL (ref 150–400)
RBC: 3.93 MIL/uL (ref 3.87–5.11)
RDW: 13.2 % (ref 11.5–15.5)
WBC: 9 10*3/uL (ref 4.0–10.5)
nRBC: 0 % (ref 0.0–0.2)

## 2022-06-23 LAB — MAGNESIUM: Magnesium: 2.1 mg/dL (ref 1.7–2.4)

## 2022-06-23 LAB — RAPID URINE DRUG SCREEN, HOSP PERFORMED
Amphetamines: NOT DETECTED
Barbiturates: NOT DETECTED
Benzodiazepines: NOT DETECTED
Cocaine: NOT DETECTED
Opiates: NOT DETECTED
Tetrahydrocannabinol: NOT DETECTED

## 2022-06-23 LAB — TROPONIN I (HIGH SENSITIVITY)
Troponin I (High Sensitivity): 12 ng/L (ref <18)
Troponin I (High Sensitivity): 17 ng/L (ref <18)

## 2022-06-23 NOTE — ED Provider Notes (Signed)
[facility] DEPT Provider Note   CSN: [id number] Arrival date & time: 06/23/22  1821     History  Chief Complaint  Patient presents with   Hypertension    Deborah Barnes is a 46 y.o. female.  46 year old female with prior medical history as detailed below presents for evaluation.  Patient reports that she was at work earlier today when she had an episode of palpitations.  She felt like her heart was racing.  She did not pass out or feel like she was going to pass out.  She denies associated chest pain.  She does report that when EMS arrived her blood pressure was notably elevated to approximately 200/100.  She reports compliance with metoprolol for treatment of her blood pressure.  She has not checked her blood pressure at home in quite some time.  She does not have established history of cardiac disease.  She is feeling significantly improved upon evaluation in the ED.  The history is provided by the patient and medical records.       Home Medications Prior to Admission medications   Medication Sig Start Date End Date Taking? Authorizing Provider  acetaminophen (TYLENOL) 500 MG tablet Take 500 mg by mouth every 6 (six) hours as needed.   Yes [provider]  albuterol (PROVENTIL HFA) 108 (90 Base) MCG/ACT inhaler Inhale 1-2 puffs into the lungs every 6 (six) hours as needed for wheezing or shortness of breath. 03/17/21  Yes Vevelyn Francois, NP  citalopram (CELEXA) 20 MG tablet Take 1 tablet (20 mg total) by mouth daily. 03/31/22  Yes Eulis Canner E, NP  colestipol (COLESTID) 1 g tablet Take 2 tablets (2 g total) by mouth 2 (two) times daily. 04/04/22  Yes Sharyn Creamer, MD  hydrOXYzine (ATARAX) 10 MG tablet Take 1 tablet (10 mg total) by mouth 3 (three) times daily as needed. Patient taking differently: Take 10 mg by mouth 3 (three) times daily as needed for anxiety or itching. 03/31/22  Yes Eulis Canner E, NP  ibuprofen  (ADVIL) 200 MG tablet Take 200 mg by mouth every 6 (six) hours as needed for mild pain.   Yes [provider]  levothyroxine (SYNTHROID) 50 MCG tablet Take 1 tablet (50 mcg total) by mouth daily before breakfast. 04/13/22 07/12/22 Yes Fenton Foy, NP  meloxicam (MOBIC) 15 MG tablet Take 1 tablet (15 mg total) by mouth daily. 06/13/22  Yes Fenton Foy, NP  metFORMIN (GLUCOPHAGE-XR) 750 MG 24 hr tablet Take 1 tablet (750 mg total) by mouth daily with breakfast. 03/11/22 03/11/23 Yes Fenton Foy, NP  metoprolol tartrate (LOPRESSOR) 50 MG tablet Take 1 tablet (50 mg total) by mouth 2 (two) times daily. 04/27/22  Yes Fenton Foy, NP  omeprazole (PRILOSEC) 40 MG capsule Take 1 capsule (40 mg total) by mouth in the morning and at bedtime. Patient taking differently: Take 40 mg by mouth daily. 04/29/22  Yes Sharyn Creamer, MD  tiZANidine (ZANAFLEX) 4 MG tablet Take 1 tablet (4 mg total) by mouth every 8 (eight) hours as needed for muscle spasms. 06/06/22 07/07/22 Yes Fenton Foy, NP  triamcinolone cream (KENALOG) 0.1 % Apply topically 2 (two) times daily. Patient taking differently: Apply 1 Application topically daily as needed (rash). 06/13/22  Yes Fenton Foy, NP  blood glucose meter kit and supplies Dispense based on patient and insurance preference. Use up to four times daily as directed. (FOR ICD-10 E10.9, E11.9). 09/08/20  Azzie Glatter, FNP  simethicone (MYLICON) 034 MG chewable tablet Chew 125 mg by mouth every 6 (six) hours as needed for flatulence. Patient not taking: Reported on 02/23/2022    [provider]      Allergies    Clarithromycin and Propoxyphene    Review of Systems   Review of Systems  All other systems reviewed and are negative.   Physical Exam Updated Vital Signs BP (!) 171/88   Pulse 79   Temp 98.1 F (36.7 C)   Resp 18   Ht 5' 4.5" (1.638 m)   Wt 133.8 kg   LMP 06/20/2022   SpO2 97%   BMI 49.85 kg/m  Physical  Exam Vitals and nursing note reviewed.  Constitutional:      General: She is not in acute distress.    Appearance: Normal appearance. She is well-developed.  HENT:     Head: Normocephalic and atraumatic.  Eyes:     Conjunctiva/sclera: Conjunctivae normal.     Pupils: Pupils are equal, round, and reactive to light.  Cardiovascular:     Rate and Rhythm: Normal rate and regular rhythm.     Heart sounds: Normal heart sounds.  Pulmonary:     Effort: Pulmonary effort is normal. No respiratory distress.     Breath sounds: Normal breath sounds.  Abdominal:     General: There is no distension.     Palpations: Abdomen is soft.     Tenderness: There is no abdominal tenderness.  Musculoskeletal:        General: No deformity. Normal range of motion.     Cervical back: Normal range of motion and neck supple.  Skin:    General: Skin is warm and dry.  Neurological:     General: No focal deficit present.     Mental Status: She is alert and oriented to person, place, and time.     ED Results / Procedures / Treatments   Labs (all labs ordered are listed, but only abnormal results are displayed) Labs Reviewed  CBC WITH DIFFERENTIAL/PLATELET - Abnormal; Notable for the following components:      Result Value   Hemoglobin 11.4 (*)    HCT 35.0 (*)    All other components within normal limits  URINALYSIS, ROUTINE W REFLEX MICROSCOPIC - Abnormal; Notable for the following components:   Hgb urine dipstick SMALL (*)    Bacteria, UA RARE (*)    All other components within normal limits  BASIC METABOLIC PANEL  MAGNESIUM  RAPID URINE DRUG SCREEN, HOSP PERFORMED  TROPONIN I (HIGH SENSITIVITY)  TROPONIN I (HIGH SENSITIVITY)    EKG EKG Interpretation  Date/Time:  Thursday June 23 2022 18:44:39 EDT Ventricular Rate:  71 PR Interval:  177 QRS Duration: 83 QT Interval:  397 QTC Calculation: 432 R Axis:   39 Text Interpretation: Sinus rhythm Probable left atrial enlargement Confirmed by  Dene Gentry [phone]) on 06/23/2022 6:54:05 PM  Radiology DG Chest Port 1 View  Result Date: 06/23/2022 CLINICAL DATA:  Palpitations EXAM: PORTABLE CHEST 1 VIEW COMPARISON:  06/13/2019 chest radiograph. FINDINGS: Stable cardiomediastinal silhouette with normal heart size. No pneumothorax. No pleural effusion. Lungs appear clear, with no acute consolidative airspace disease and no pulmonary edema. IMPRESSION: No active disease. Electronically Signed   By: Ilona Sorrel M.D.   On: 06/23/2022 19:30    Procedures Procedures    Medications Ordered in ED Medications - No data to display  ED Course/ Medical Decision Making/ A&P  Medical Decision Making Amount and/or Complexity of Data Reviewed Labs: ordered. Radiology: ordered.    Medical Screen Complete  This patient presented to the ED with complaint of palpitations.  This complaint involves an extensive number of treatment options. The initial differential diagnosis includes, but is not limited to, arrhythmia, ACS, metabolic abnormality, etc.  This presentation is: Acute, Self-Limited, Previously Undiagnosed, Uncertain Prognosis, Complicated, Systemic Symptoms, and Threat to Life/Bodily Function  Patient presents for evaluation of reported palpitations.  Patient's presentation is not consistent with likely ACS.  Screening labs obtained are without significant abnormality.  Patient with blood pressure that is mildly elevated.  However, patient without evidence of endorgan damage.  Patient with longstanding history of hypertension.  Patient is reporting compliance with previously prescribed metoprolol.  Patient is desirable of discharge.  She does understand need for close outpatient follow-up both with her PCP and also with cardiology.  Strict return precautions given and understood.  Importance of close follow-up is stressed.   Additional history obtained: External records from outside sources  obtained and reviewed including prior ED visits and prior Inpatient records.    Lab Tests:  I ordered and personally interpreted labs.  The pertinent results include: CBC, BMP, UA, magnesium, troponin x2, urine tox   Imaging Studies ordered:  I ordered imaging studies including chest x-ray I independently visualized and interpreted obtained imaging which showed NAD I agree with the radiologist interpretation.   Cardiac Monitoring:  The patient was maintained on a cardiac monitor.  I personally viewed and interpreted the cardiac monitor which showed an underlying rhythm of: NSR  Problem List / ED Course:  Palpitation  Reevaluation:  After the interventions noted above, I reevaluated the patient and found that they have: improved  Disposition:  After consideration of the diagnostic results and the patients response to treatment, I feel that the patent would benefit from close outpatient follow-up.          Final Clinical Impression(s) / ED Diagnoses Final diagnoses:  Palpitations    Rx / DC Orders ED Discharge Orders     None         Valarie Merino, MD 06/23/22 2215

## 2022-06-23 NOTE — ED Triage Notes (Signed)
Pt presents via EMS from home with c/o hypertension. Pt has a hx of same, been taking her medication as prescribed. For EMS, BP was 230/150 initially, last BP for them was 180/100. Pt is not symptomatic at this time. Denies any chest pain, shortness of breath, dizziness.

## 2022-06-23 NOTE — Discharge Instructions (Addendum)
Return for any problem.  ?

## 2022-06-24 ENCOUNTER — Other Ambulatory Visit: Payer: Self-pay | Admitting: Nurse Practitioner

## 2022-06-24 ENCOUNTER — Other Ambulatory Visit (HOSPITAL_COMMUNITY): Payer: Self-pay

## 2022-06-24 ENCOUNTER — Other Ambulatory Visit: Payer: Self-pay | Admitting: Internal Medicine

## 2022-06-24 ENCOUNTER — Other Ambulatory Visit: Payer: Self-pay

## 2022-06-24 DIAGNOSIS — K921 Melena: Secondary | ICD-10-CM

## 2022-06-24 DIAGNOSIS — K259 Gastric ulcer, unspecified as acute or chronic, without hemorrhage or perforation: Secondary | ICD-10-CM

## 2022-06-24 DIAGNOSIS — R1011 Right upper quadrant pain: Secondary | ICD-10-CM

## 2022-06-24 DIAGNOSIS — I10 Essential (primary) hypertension: Secondary | ICD-10-CM

## 2022-06-24 MED ORDER — OMEPRAZOLE 40 MG PO CPDR
40.0000 mg | DELAYED_RELEASE_CAPSULE | Freq: Two times a day (BID) | ORAL | 0 refills | Status: DC
  Filled 2022-06-24: qty 60, 30d supply, fill #0
  Filled 2022-07-20: qty 52, 26d supply, fill #1

## 2022-06-27 ENCOUNTER — Telehealth (HOSPITAL_COMMUNITY): Payer: 59 | Admitting: Psychiatry

## 2022-06-27 ENCOUNTER — Other Ambulatory Visit (HOSPITAL_COMMUNITY): Payer: Self-pay

## 2022-06-29 ENCOUNTER — Other Ambulatory Visit (HOSPITAL_COMMUNITY): Payer: Self-pay

## 2022-06-29 ENCOUNTER — Telehealth (INDEPENDENT_AMBULATORY_CARE_PROVIDER_SITE_OTHER): Payer: 59 | Admitting: Psychiatry

## 2022-06-29 ENCOUNTER — Encounter (HOSPITAL_COMMUNITY): Payer: Self-pay | Admitting: Psychiatry

## 2022-06-29 DIAGNOSIS — F3341 Major depressive disorder, recurrent, in partial remission: Secondary | ICD-10-CM

## 2022-06-29 DIAGNOSIS — F419 Anxiety disorder, unspecified: Secondary | ICD-10-CM

## 2022-06-29 MED ORDER — CITALOPRAM HYDROBROMIDE 20 MG PO TABS
20.0000 mg | ORAL_TABLET | Freq: Every day | ORAL | 3 refills | Status: DC
  Filled 2022-06-29 – 2022-07-24 (×2): qty 60, 60d supply, fill #0
  Filled 2022-10-20: qty 60, 60d supply, fill #1

## 2022-06-29 MED ORDER — HYDROXYZINE HCL 10 MG PO TABS
10.0000 mg | ORAL_TABLET | Freq: Three times a day (TID) | ORAL | 3 refills | Status: DC | PRN
  Filled 2022-06-29: qty 90, 30d supply, fill #0
  Filled 2022-08-05 – 2022-09-15 (×3): qty 90, 30d supply, fill #1
  Filled 2022-11-15: qty 90, 30d supply, fill #2
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
## 2022-07-01 ENCOUNTER — Telehealth (HOSPITAL_COMMUNITY): Payer: Self-pay | Admitting: Psychiatry

## 2022-07-02 ENCOUNTER — Other Ambulatory Visit (HOSPITAL_COMMUNITY): Payer: Self-pay

## 2022-07-04 ENCOUNTER — Other Ambulatory Visit (HOSPITAL_COMMUNITY): Payer: Self-pay

## 2022-07-04 MED ORDER — METOPROLOL TARTRATE 50 MG PO TABS
50.0000 mg | ORAL_TABLET | Freq: Two times a day (BID) | ORAL | 1 refills | Status: DC
  Filled 2022-07-04: qty 60, 30d supply, fill #0

## 2022-07-06 ENCOUNTER — Other Ambulatory Visit (HOSPITAL_COMMUNITY): Payer: Self-pay

## 2022-07-07 ENCOUNTER — Other Ambulatory Visit (HOSPITAL_COMMUNITY): Payer: Self-pay

## 2022-07-08 ENCOUNTER — Encounter: Payer: Self-pay | Admitting: Nurse Practitioner

## 2022-07-08 ENCOUNTER — Ambulatory Visit: Payer: 59 | Admitting: Nurse Practitioner

## 2022-07-08 VITALS — BP 137/103 | HR 91 | Ht 64.5 in | Wt 289.6 lb

## 2022-07-08 DIAGNOSIS — E039 Hypothyroidism, unspecified: Secondary | ICD-10-CM

## 2022-07-08 NOTE — Patient Instructions (Addendum)
1. Hypothyroidism, unspecified type  - TSH - CBC - Comprehensive metabolic panel    Follow up:  Follow up as scheduled

## 2022-07-08 NOTE — Progress Notes (Unsigned)
'@Patient'  ID: Deborah Barnes, female    DOB: [birth year], 46 y.o.   MRN: [id number]  Chief Complaint  Patient presents with   Hospitalization Follow-up    Referring provider: Fenton Foy, NP   HPI  Patient presents today for a ED follow-up.  She was seen in the ED on 06/23/2022 for heart palpitations.  Patient states she has.'s for her heart races at times.  States that she has been making drastic changes to her diet.  She is trying to lose weight and get healthier.  She does have an appointment scheduled with cardiology in a couple weeks. Denies f/c/s, n/v/d, hemoptysis, PND, leg swelling Denies chest pain or edema    Allergies  Allergen Reactions   Clarithromycin Diarrhea    abd pain   Propoxyphene Itching    Immunization History  Administered Date(s) Administered   Influenza,inj,Quad PF,6+ Mos 05/29/2019   Pneumococcal Polysaccharide-23 04/26/2013   Tdap 11/01/2018    Past Medical History:  Diagnosis Date   Asthma    Back pain    Bipolar disorder (HCC)    DX in the past but not currently be treated   Bulging disc    Complication of anesthesia 2014   anesthesia ? caused depression/suicidal ideation per pt.   COPD (chronic obstructive pulmonary disease) (HCC)    De Quervain's tenosynovitis, right 10/2019   surgery   Degenerative disc disease    Depression    Depression with anxiety    DJD (degenerative joint disease)    right hip   GERD (gastroesophageal reflux disease)    Hepatitis C 08/2017   TX for HEP C   Hip pain, right    History of alcohol abuse    History of IBS    History of mixed drug abuse ([place])    Hypercholesterolemia    Hypertension    Hypoglycemia    Hypothyroidism    PTSD (post-traumatic stress disorder)    Sciatica    Sciatica 01/28/2022   Shortness of breath    Thyroid disease    Vitamin D deficiency 02/2020   Walker as ambulation aid     Tobacco History: Social History   Tobacco Use  Smoking Status Former   Packs/day:  0.50   Years: 27.00   Total pack years: 13.50   Types: Cigarettes   Quit date: 08/24/2018   Years since quitting: 3.8  Smokeless Tobacco Never  Tobacco Comments   vape former user   Counseling given: Not Answered Tobacco comments: vape former user   Outpatient Encounter Medications as of 07/08/2022  Medication Sig   acetaminophen (TYLENOL) 500 MG tablet Take 500 mg by mouth every 6 (six) hours as needed.   albuterol (PROVENTIL HFA) 108 (90 Base) MCG/ACT inhaler Inhale 1-2 puffs into the lungs every 6 (six) hours as needed for wheezing or shortness of breath.   blood glucose meter kit and supplies Dispense based on patient and insurance preference. Use up to four times daily as directed. (FOR ICD-10 E10.9, E11.9).   citalopram (CELEXA) 20 MG tablet Take 1 tablet (20 mg total) by mouth daily.   colestipol (COLESTID) 1 g tablet Take 2 tablets (2 g total) by mouth 2 (two) times daily.   hydrOXYzine (ATARAX) 10 MG tablet Take 1 tablet (10 mg total) by mouth 3 (three) times daily as needed.   ibuprofen (ADVIL) 200 MG tablet Take 200 mg by mouth every 6 (six) hours as needed for mild pain.   levothyroxine (SYNTHROID) 50 MCG  tablet Take 1 tablet (50 mcg total) by mouth daily before breakfast.   meloxicam (MOBIC) 15 MG tablet Take 1 tablet (15 mg total) by mouth daily.   metFORMIN (GLUCOPHAGE-XR) 750 MG 24 hr tablet Take 1 tablet (750 mg total) by mouth daily with breakfast.   metoprolol tartrate (LOPRESSOR) 50 MG tablet Take 1 tablet (50 mg total) by mouth 2 (two) times daily.   omeprazole (PRILOSEC) 40 MG capsule Take 1 capsule (40 mg total) by mouth in the morning and at bedtime.   triamcinolone cream (KENALOG) 0.1 % Apply topically 2 (two) times daily. (Patient taking differently: Apply 1 Application topically daily as needed (rash).)   simethicone (MYLICON) 794 MG chewable tablet Chew 125 mg by mouth every 6 (six) hours as needed for flatulence. (Patient not taking: Reported on 02/23/2022)    Facility-Administered Encounter Medications as of 07/08/2022  Medication   0.9 %  sodium chloride infusion     Review of Systems  Review of Systems  Constitutional: Negative.   HENT: Negative.    Cardiovascular: Negative.   Gastrointestinal: Negative.   Allergic/Immunologic: Negative.   Neurological: Negative.   Psychiatric/Behavioral: Negative.         Physical Exam  BP (!) 137/103   Pulse 91   Ht 5' 4.5" (1.638 m)   Wt 289 lb 9.6 oz (131.4 kg)   LMP 06/20/2022   SpO2 98%   BMI 48.94 kg/m   Wt Readings from Last 5 Encounters:  07/08/22 289 lb 9.6 oz (131.4 kg)  06/23/22 295 lb (133.8 kg)  06/13/22 293 lb 6 oz (133.1 kg)  03/11/22 288 lb 9.6 oz (130.9 kg)  02/24/22 286 lb (129.7 kg)     Physical Exam Vitals and nursing note reviewed.  Constitutional:      General: She is not in acute distress.    Appearance: She is well-developed.  Cardiovascular:     Rate and Rhythm: Normal rate and regular rhythm.  Pulmonary:     Effort: Pulmonary effort is normal.     Breath sounds: Normal breath sounds.  Neurological:     Mental Status: She is alert and oriented to person, place, and time.      Assessment & Plan:   Hypothyroidism - TSH - CBC - Comprehensive metabolic panel    Follow up:  Follow up as scheduled     Fenton Foy, NP 07/11/2022

## 2022-07-11 ENCOUNTER — Ambulatory Visit (HOSPITAL_COMMUNITY): Payer: 59 | Admitting: Licensed Clinical Social Worker

## 2022-07-11 ENCOUNTER — Encounter (HOSPITAL_COMMUNITY): Payer: Self-pay

## 2022-07-11 ENCOUNTER — Encounter: Payer: Self-pay | Admitting: Nurse Practitioner

## 2022-07-11 NOTE — Telephone Encounter (Signed)
Please make work note for patient and send through [place] for office visit the other day.

## 2022-07-11 NOTE — Assessment & Plan Note (Signed)
-   TSH - CBC - Comprehensive metabolic panel    Follow up:  Follow up as scheduled

## 2022-07-16 ENCOUNTER — Other Ambulatory Visit (HOSPITAL_COMMUNITY): Payer: Self-pay

## 2022-07-19 ENCOUNTER — Encounter (INDEPENDENT_AMBULATORY_CARE_PROVIDER_SITE_OTHER): Payer: Self-pay

## 2022-07-19 ENCOUNTER — Other Ambulatory Visit (HOSPITAL_COMMUNITY): Payer: Self-pay

## 2022-07-20 ENCOUNTER — Other Ambulatory Visit (HOSPITAL_COMMUNITY): Payer: Self-pay

## 2022-07-23 ENCOUNTER — Other Ambulatory Visit (HOSPITAL_COMMUNITY): Payer: Self-pay

## 2022-07-25 ENCOUNTER — Other Ambulatory Visit (HOSPITAL_COMMUNITY): Payer: Self-pay

## 2022-07-25 NOTE — Progress Notes (Unsigned)
Cardiology Office Note:    Date:  07/25/2022   ID:  Deborah Barnes, DOB 1976-07-25, MRN [id number]  PCP:  Fenton Foy, NP   [place] Providers Cardiologist:  None { Click to update primary MD,subspecialty MD or APP then REFRESH:1}    Referring MD: Valarie Merino, MD   No chief complaint on file. ***  History of Present Illness:    Deborah Barnes is a 46 y.o. female with a hx of ***  Past Medical History:  Diagnosis Date   Asthma    Back pain    Bipolar disorder ([place])    DX in the past but not currently be treated   Bulging disc    Complication of anesthesia 2014   anesthesia ? caused depression/suicidal ideation per pt.   COPD (chronic obstructive pulmonary disease) ([place])    De Quervain's tenosynovitis, right 10/2019   surgery   Degenerative disc disease    Depression    Depression with anxiety    DJD (degenerative joint disease)    right hip   GERD (gastroesophageal reflux disease)    Hepatitis C 08/2017   TX for HEP C   Hip pain, right    History of alcohol abuse    History of IBS    History of mixed drug abuse ([place])    Hypercholesterolemia    Hypertension    Hypoglycemia    Hypothyroidism    PTSD (post-traumatic stress disorder)    Sciatica    Sciatica 01/28/2022   Shortness of breath    Thyroid disease    Vitamin D deficiency 02/2020   Walker as ambulation aid     Past Surgical History:  Procedure Laterality Date   BIOPSY N/A 09/25/2014   Procedure: BIOPSY;  Surgeon: Daneil Dolin, MD;  Location: [facility];  Service: Endoscopy;  Laterality: N/A;  Gastric, Ascending Colon, Descending/Sigmoid Colon    CARPAL TUNNEL RELEASE Bilateral    CHOLECYSTECTOMY N/A 04/26/2013   Procedure: LAPAROSCOPIC CHOLECYSTECTOMY;  Surgeon: Jamesetta So, MD;  Location: [facility];  Service: General;  Laterality: N/A;   COLONOSCOPY WITH PROPOFOL N/A 09/25/2014   RMR: Pancolonic diverticulosis. multiple tubular adenomas, segmental biopsies negative.  surveillance in 5 years   COLONOSCOPY WITH PROPOFOL N/A 01/16/2020   diverticulosis, two 4-6 mm polyps in sigmoid and splenic flexure, one 10 mm polyp in sigmoid Tubular adenomas, 3 year surveillance.   DORSAL COMPARTMENT RELEASE Right 10/30/2019   Procedure: RIGHT WRIST 1ST  DORSAL COMPARTMENT RELEASE (DEQUERVAIN);  Surgeon: Marybelle Killings, MD;  Location: [facility];  Service: Orthopedics;  Laterality: Right;   ESOPHAGOGASTRODUODENOSCOPY (EGD) WITH PROPOFOL N/A 09/25/2014   RMR: Normal esophagus. Abnormal gastric mucosa status post biopsy (negative H.pylori). Hiatal hernia.    ESOPHAGOGASTRODUODENOSCOPY (EGD) WITH PROPOFOL N/A 01/16/2020   Normal esophagus s/p dilation, normal stomach and duodenal bulb.    INCISIONAL HERNIA REPAIR N/A 10/04/2013   Procedure: HERNIA REPAIR INCISIONAL WITH MESH;  Surgeon: Jamesetta So, MD;  Location: [facility];  Service: General;  Laterality: N/A;   INSERTION OF MESH N/A 10/04/2013   Procedure: INSERTION OF MESH;  Surgeon: Jamesetta So, MD;  Location: [facility];  Service: General;  Laterality: N/A;   MALONEY DILATION N/A 01/16/2020   Procedure: Keturah Shavers;  Surgeon: Daneil Dolin, MD;  [facility];  Service: Endoscopy;  Laterality: N/A;   POLYPECTOMY N/A 09/25/2014   Procedure: POLYPECTOMY;  Surgeon: Daneil Dolin, MD;  Location: [facility];  Service:  Endoscopy;  Laterality: N/A;  Cecal, Ascending Colon    POLYPECTOMY  01/16/2020   Procedure: POLYPECTOMY;  Surgeon: Daneil Dolin, MD;  [facility];  Service: Endoscopy;;   SHOULDER SURGERY Left    TOOTH EXTRACTION  08/09/2019   TOTAL HIP ARTHROPLASTY Right 04/27/2020   Procedure: TOTAL HIP ARTHROPLASTY DIRECT ANTERIOR;  Surgeon: Marybelle Killings, MD;  Location: [facility];  Service: Orthopedics;  Laterality: Right;   TUBAL LIGATION     WISDOM TOOTH EXTRACTION      Current Medications: No outpatient medications have been marked as taking for the 07/27/22 encounter (Appointment) with Freada Bergeron, MD.    Current Facility-Administered Medications for the 07/27/22 encounter (Appointment) with Freada Bergeron, MD  Medication   0.9 %  sodium chloride infusion     Allergies:   Clarithromycin and Propoxyphene   Social History   Socioeconomic History   Marital status: Divorced    Spouse name: Not on file   Number of children: 2   Years of education: Not on file   Highest education level: Not on file  Occupational History   Occupation: Radio broadcast assistant  Tobacco Use   Smoking status: Former    Packs/day: 0.50    Years: 27.00    Total pack years: 13.50    Types: Cigarettes    Quit date: 08/24/2018    Years since quitting: 3.9   Smokeless tobacco: Never   Tobacco comments:    vape former user  Vaping Use   Vaping Use: Every day   Last attempt to quit: 08/24/2018   Substances: Nicotine  Substance and Sexual Activity   Alcohol use: Not Currently    Alcohol/week: 1.0 standard drink of alcohol    Types: 1 Cans of beer per week    Comment: hx binge drinker. none since 01/23/18   Drug use: Not Currently    Types: Marijuana, Cocaine, Methamphetamines, Heroin    Comment: no cocaine, heroin, meth since 01/23/18. last marijuana use 01-23-18   Sexual activity: Not Currently    Birth control/protection: Surgical    Comment: tubal ligation  Other Topics Concern   Not on file  Social History Narrative   Not on file   Social Determinants of Health   Financial Resource Strain: Not on file  Food Insecurity: Not on file  Transportation Needs: Not on file  Physical Activity: Not on file  Stress: Not on file  Social Connections: Not on file     Family History: The patient's ***family history includes Cancer in her paternal grandmother; Colon cancer in her maternal grandmother; Colon polyps in her maternal aunt; Diabetes in her father; GER disease in her paternal grandmother; Heart disease in her paternal grandmother; Hypertension in her paternal grandmother; Osteoporosis in her paternal  grandmother.  ROS:   Please see the history of present illness.    *** All other systems reviewed and are negative.  EKGs/Labs/Other Studies Reviewed:    The following studies were reviewed today: ***  EKG:  EKG is *** ordered today.  The ekg ordered today demonstrates ***  Recent Labs: 03/11/2022: ALT 11; TSH 0.646 06/23/2022: BUN 17; Creatinine, Ser 0.71; Hemoglobin 11.4; Magnesium 2.1; Platelets 369; Potassium 4.0; Sodium 138  Recent Lipid Panel    Component Value Date/Time   CHOL 169 03/17/2021 1158   TRIG 269 (H) 03/17/2021 1158   HDL 35 (L) 03/17/2021 1158   CHOLHDL 4.8 (H) 03/17/2021 1158   CHOLHDL 2.9 09/05/2017 0621   VLDL 27  09/05/2017 0621   [place] 89 03/17/2021 1158     Risk Assessment/Calculations:   {Does this patient have ATRIAL FIBRILLATION?[phone]}  No BP recorded.  {Refresh Note OR Click here to enter BP  :1}***         Physical Exam:    VS:  There were no vitals taken for this visit.    Wt Readings from Last 3 Encounters:  07/08/22 289 lb 9.6 oz (131.4 kg)  06/23/22 295 lb (133.8 kg)  06/13/22 293 lb 6 oz (133.1 kg)     GEN: *** Well nourished, well developed in no acute distress HEENT: Normal NECK: No JVD; No carotid bruits LYMPHATICS: No lymphadenopathy CARDIAC: ***RRR, no murmurs, rubs, gallops RESPIRATORY:  Clear to auscultation without rales, wheezing or rhonchi  ABDOMEN: Soft, non-tender, non-distended MUSCULOSKELETAL:  No edema; No deformity  SKIN: Warm and dry NEUROLOGIC:  Alert and oriented x 3 PSYCHIATRIC:  Normal affect   ASSESSMENT:    No diagnosis found. PLAN:    In order of problems listed above:  ***      {Are you ordering a CV Procedure (e.g. stress test, cath, DCCV, TEE, etc)?   Press F2        :[id number]}    Medication Adjustments/Labs and Tests Ordered: Current medicines are reviewed at length with the patient today.  Concerns regarding medicines are outlined above.  No orders of the defined types were  placed in this encounter.  No orders of the defined types were placed in this encounter.   There are no Patient Instructions on file for this visit.   Signed, Freada Bergeron, MD  07/25/2022 8:34 PM    [place]

## 2022-07-27 ENCOUNTER — Encounter: Payer: Self-pay | Admitting: Cardiology

## 2022-07-27 ENCOUNTER — Ambulatory Visit: Payer: 59 | Attending: Cardiology | Admitting: Cardiology

## 2022-07-27 ENCOUNTER — Other Ambulatory Visit (HOSPITAL_COMMUNITY): Payer: Self-pay

## 2022-07-27 ENCOUNTER — Other Ambulatory Visit: Payer: Self-pay | Admitting: Nurse Practitioner

## 2022-07-27 ENCOUNTER — Telehealth: Payer: Self-pay | Admitting: *Deleted

## 2022-07-27 ENCOUNTER — Ambulatory Visit (INDEPENDENT_AMBULATORY_CARE_PROVIDER_SITE_OTHER): Payer: Self-pay

## 2022-07-27 VITALS — BP 139/77 | HR 66 | Ht 64.5 in | Wt 289.8 lb

## 2022-07-27 DIAGNOSIS — R002 Palpitations: Secondary | ICD-10-CM

## 2022-07-27 DIAGNOSIS — R7303 Prediabetes: Secondary | ICD-10-CM

## 2022-07-27 DIAGNOSIS — I10 Essential (primary) hypertension: Secondary | ICD-10-CM

## 2022-07-27 MED ORDER — AMLODIPINE BESYLATE 5 MG PO TABS
5.0000 mg | ORAL_TABLET | Freq: Every day | ORAL | 3 refills | Status: DC
  Filled 2022-07-27: qty 90, 90d supply, fill #0
  Filled 2022-10-20: qty 90, 90d supply, fill #1

## 2022-07-27 MED ORDER — CARVEDILOL 25 MG PO TABS
25.0000 mg | ORAL_TABLET | Freq: Two times a day (BID) | ORAL | 3 refills | Status: DC
  Filled 2022-07-27: qty 180, 90d supply, fill #0
  Filled 2022-10-20: qty 180, 90d supply, fill #1

## 2022-07-27 NOTE — Patient Instructions (Signed)
Medication Instructions:   STOP TAKING METOPROLOL TARTRATE NOW  START TAKING CARVEDILOL (COREG) 25 MG BY MOUTH TWICE DAILY  START TAKING AMLODIPINE 5 MG BY MOUTH DAILY  *If you need a refill on your cardiac medications before your next appointment, please call your pharmacy*   Testing/Procedures:  ZIO XT- Long Term Monitor Instructions  Your physician has requested you wear a ZIO patch monitor for 3 days.  This is a single patch monitor. [employer] supplies one patch monitor per enrollment. Additional stickers are not available. Please do not apply patch if you will be having a Nuclear Stress Test,  Echocardiogram, Cardiac CT, MRI, or Chest Xray during the period you would be wearing the  monitor. The patch cannot be worn during these tests. You cannot remove and re-apply the  ZIO XT patch monitor.  Your ZIO patch monitor will be mailed 3 day USPS to your address on file. It may take 3-5 days  to receive your monitor after you have been enrolled.  Once you have received your monitor, please review the enclosed instructions. Your monitor  has already been registered assigning a specific monitor serial # to you.  Billing and Patient Assistance Program Information  We have supplied [employer] with any of your insurance information on file for billing purposes. [employer] offers a sliding scale Patient Assistance Program for patients that do not have  insurance, or whose insurance does not completely cover the cost of the ZIO monitor.  You must apply for the Patient Assistance Program to qualify for this discounted rate.  To apply, please call [employer] at [phone], select option 4, select option 2, ask to apply for  Patient Assistance Program. Theodore Demark will ask your household income, and how many people  are in your household. They will quote your out-of-pocket cost based on that information.  [employer] will also be able to set up a 82-month interest-free payment plan if needed.  Applying  the monitor   Shave hair from upper left chest.  Hold abrader disc by orange tab. Rub abrader in 40 strokes over the upper left chest as  indicated in your monitor instructions.  Clean area with 4 enclosed alcohol pads. Let dry.  Apply patch as indicated in monitor instructions. Patch will be placed under collarbone on left  side of chest with arrow pointing upward.  Rub patch adhesive wings for 2 minutes. Remove white label marked "1". Remove the white  label marked "2". Rub patch adhesive wings for 2 additional minutes.  While looking in a mirror, press and release button in center of patch. A small green light will  flash 3-4 times. This will be your only indicator that the monitor has been turned on.  Do not shower for the first 24 hours. You may shower after the first 24 hours.  Press the button if you feel a symptom. You will hear a small click. Record Date, Time and  Symptom in the Patient Logbook.  When you are ready to remove the patch, follow instructions on the last 2 pages of Patient  Logbook. Stick patch monitor onto the last page of Patient Logbook.  Place Patient Logbook in the blue and white box. Use locking tab on box and tape box closed  securely. The blue and white box has prepaid postage on it. Please place it in the mailbox as  soon as possible. Your physician should have your test results approximately 7 days after the  monitor has been mailed back to [facility]  Call  [facility] at [phone] if you have questions regarding  your ZIO XT patch monitor. Call them immediately if you see an orange light blinking on your  monitor.  If your monitor falls off in less than 4 days, contact our Monitor department at [phone].  If your monitor becomes loose or falls off after 4 days call [employer] at [phone] for  suggestions on securing your monitor    Follow-Up: At [facility], you and your health needs are our priority.  As part  of our continuing mission to provide you with exceptional heart care, we have created designated Provider Care Teams.  These Care Teams include your primary Cardiologist (physician) and Advanced Practice Providers (APPs -  Physician Assistants and Nurse Practitioners) who all work together to provide you with the care you need, when you need it.  We recommend signing up for the patient portal called "MyChart".  Sign up information is provided on this After Visit Summary.  MyChart is used to connect with patients for Virtual Visits (Telemedicine).  Patients are able to view lab/test results, encounter notes, upcoming appointments, etc.  Non-urgent messages can be sent to your provider as well.   To learn more about what you can do with MyChart, go to [email or web address].    Your next appointment:   6 month(s)  The format for your next appointment:   In Person  Provider:   DR. Johney Frame   Important Information About Sugar

## 2022-07-27 NOTE — Progress Notes (Signed)
Cardiology Office Note:    Date:  07/27/2022   ID:  Deborah Barnes, DOB October 04, 1975, MRN [id number]  PCP:  Fenton Foy, NP   [place] Providers Cardiologist:  None   Referring MD: Valarie Merino, MD    History of Present Illness:    Deborah Barnes is a 46 y.o. female with a hx of bipolar disorder, anxiety, COPD, HTN, and history of polysubstance abuse who was referred by Dr. Francia Greaves for further evaluation of palpitations.  Patient seen in [facility] ER on 06/23/22. Note reviewed. She presented with palpitations. BP very elevated on presentation at 200/100. ECG with NSR with HR 71. Trop negative x2, CBC, BMET, utox and mag unremarkable. She was referred to Cardiology for further evaluation.  Today, the patient states that her palpitations have been intermittent for years. However, on the day she presented to the ED she felt like her palpitations would not stop. In total her palpitations lasted for at least over an hour and began to subside after arriving at the ER. She did have some associated lightheadedness and shortness of breath. Of note, she was under a lot of stress in the past couple weeks and she is premenopausal. She thinks this is contributing. At this time, she continues to have palpitations daily.  At home her blood pressure has been consistently 338-250N systolic. Of note, she may try to hold her arm up when taking her blood pressure. We reviewed recommended techniques for monitoring her BP.   She endorses some LE swelling, usually mild by the end of the day. Her edema does improve overnight.  Since going to the ED she has been working on dietary changes. This includes cutting back on sugar, carbs, and sodium. She is also focusing on eating more vegetables. Additionally she confirms that she is prediabetic. Lately her exercise has been very limited due to her hip pain and spinal stenosis.  In the past 4 years she has successfully remained sober.   She has  hypothyroidism managed well by her medication and followed by her PCP. Her TSH has been normal.  She denies any chest pain, headaches, syncope, orthopnea, or PND.   Past Medical History:  Diagnosis Date   Asthma    Back pain    Bipolar disorder ([place])    DX in the past but not currently be treated   Bulging disc    Complication of anesthesia 2014   anesthesia ? caused depression/suicidal ideation per pt.   COPD (chronic obstructive pulmonary disease) ([place])    De Quervain's tenosynovitis, right 10/2019   surgery   Degenerative disc disease    Depression    Depression with anxiety    DJD (degenerative joint disease)    right hip   GERD (gastroesophageal reflux disease)    Hepatitis C 08/2017   TX for HEP C   Hip pain, right    History of alcohol abuse    History of IBS    History of mixed drug abuse ([place])    Hypercholesterolemia    Hypertension    Hypoglycemia    Hypothyroidism    PTSD (post-traumatic stress disorder)    Sciatica    Sciatica 01/28/2022   Shortness of breath    Thyroid disease    Vitamin D deficiency 02/2020   Walker as ambulation aid     Past Surgical History:  Procedure Laterality Date   BIOPSY N/A 09/25/2014   Procedure: BIOPSY;  Surgeon: Daneil Dolin, MD;  [facility];  Service: Endoscopy;  Laterality: N/A;  Gastric, Ascending Colon, Descending/Sigmoid Colon    CARPAL TUNNEL RELEASE Bilateral    CHOLECYSTECTOMY N/A 04/26/2013   Procedure: LAPAROSCOPIC CHOLECYSTECTOMY;  Surgeon: Jamesetta So, MD;  Location: [facility];  Service: General;  Laterality: N/A;   COLONOSCOPY WITH PROPOFOL N/A 09/25/2014   RMR: Pancolonic diverticulosis. multiple tubular adenomas, segmental biopsies negative. surveillance in 5 years   COLONOSCOPY WITH PROPOFOL N/A 01/16/2020   diverticulosis, two 4-6 mm polyps in sigmoid and splenic flexure, one 10 mm polyp in sigmoid Tubular adenomas, 3 year surveillance.   DORSAL COMPARTMENT RELEASE Right 10/30/2019   Procedure: RIGHT  WRIST 1ST  DORSAL COMPARTMENT RELEASE (DEQUERVAIN);  Surgeon: Marybelle Killings, MD;  Location: [place];  Service: Orthopedics;  Laterality: Right;   ESOPHAGOGASTRODUODENOSCOPY (EGD) WITH PROPOFOL N/A 09/25/2014   RMR: Normal esophagus. Abnormal gastric mucosa status post biopsy (negative H.pylori). Hiatal hernia.    ESOPHAGOGASTRODUODENOSCOPY (EGD) WITH PROPOFOL N/A 01/16/2020   Normal esophagus s/p dilation, normal stomach and duodenal bulb.    INCISIONAL HERNIA REPAIR N/A 10/04/2013   Procedure: HERNIA REPAIR INCISIONAL WITH MESH;  Surgeon: Jamesetta So, MD;  Location: [facility];  Service: General;  Laterality: N/A;   INSERTION OF MESH N/A 10/04/2013   Procedure: INSERTION OF MESH;  Surgeon: Jamesetta So, MD;  [facility]: [facility];  Service: General;  Laterality: N/A;   MALONEY DILATION N/A 01/16/2020   Procedure: Keturah Shavers;  Surgeon: Daneil Dolin, MD;  [facility];  Service: Endoscopy;  Laterality: N/A;   POLYPECTOMY N/A 09/25/2014   Procedure: POLYPECTOMY;  Surgeon: Daneil Dolin, MD;  [facility];  Service: Endoscopy;  Laterality: N/A;  Cecal, Ascending Colon    POLYPECTOMY  01/16/2020   Procedure: POLYPECTOMY;  Surgeon: Daneil Dolin, MD;  [facility];  Service: Endoscopy;;   SHOULDER SURGERY Left    TOOTH EXTRACTION  08/09/2019   TOTAL HIP ARTHROPLASTY Right 04/27/2020   Procedure: TOTAL HIP ARTHROPLASTY DIRECT ANTERIOR;  Surgeon: Marybelle Killings, MD;  Location: [place];  Service: Orthopedics;  Laterality: Right;   TUBAL LIGATION     WISDOM TOOTH EXTRACTION      Current Medications: Current Meds  Medication Sig   acetaminophen (TYLENOL) 500 MG tablet Take 500 mg by mouth every 6 (six) hours as needed.   albuterol (PROVENTIL HFA) 108 (90 Base) MCG/ACT inhaler Inhale 1-2 puffs into the lungs every 6 (six) hours as needed for wheezing or shortness of breath.   amLODipine (NORVASC) 5 MG tablet Take 1 tablet (5 mg total) by mouth daily.   blood glucose meter kit and  supplies Dispense based on patient and insurance preference. Use up to four times daily as directed. (FOR ICD-10 E10.9, E11.9).   carvedilol (COREG) 25 MG tablet Take 1 tablet (25 mg total) by mouth 2 (two) times daily with a meal.   citalopram (CELEXA) 20 MG tablet Take 1 tablet (20 mg total) by mouth daily.   colestipol (COLESTID) 1 g tablet Take 2 tablets (2 g total) by mouth 2 (two) times daily.   hydrOXYzine (ATARAX) 10 MG tablet Take 1 tablet (10 mg total) by mouth 3 (three) times daily as needed.   ibuprofen (ADVIL) 200 MG tablet Take 200 mg by mouth every 6 (six) hours as needed for mild pain.   levothyroxine (SYNTHROID) 50 MCG tablet Take 1 tablet (50 mcg total) by mouth daily before breakfast.   meloxicam (MOBIC) 15 MG tablet  Take 1 tablet (15 mg total) by mouth daily.   metFORMIN (GLUCOPHAGE-XR) 750 MG 24 hr tablet Take 1 tablet (750 mg total) by mouth daily with breakfast.   omeprazole (PRILOSEC) 40 MG capsule Take 1 capsule (40 mg total) by mouth in the morning and at bedtime.   triamcinolone cream (KENALOG) 0.1 % Apply topically 2 (two) times daily.   [DISCONTINUED] metoprolol tartrate (LOPRESSOR) 50 MG tablet Take 1 tablet (50 mg total) by mouth 2 (two) times daily.   Current Facility-Administered Medications for the 07/27/22 encounter (Office Visit) with Freada Bergeron, MD  Medication   0.9 %  sodium chloride infusion     Allergies:   Clarithromycin and Propoxyphene   Social History   Socioeconomic History   Marital status: Divorced    Spouse name: Not on file   Number of children: 2   Years of education: Not on file   Highest education level: Not on file  Occupational History   Occupation: Radio broadcast assistant  Tobacco Use   Smoking status: Former    Packs/day: 0.50    Years: 27.00    Total pack years: 13.50    Types: Cigarettes    Quit date: 08/24/2018    Years since quitting: 3.9   Smokeless tobacco: Current   Tobacco comments:    vape former user  Vaping  Use   Vaping Use: Every day   Last attempt to quit: 08/24/2018   Substances: Nicotine  Substance and Sexual Activity   Alcohol use: Not Currently    Alcohol/week: 1.0 standard drink of alcohol    Types: 1 Cans of beer per week    Comment: hx binge drinker. none since 01/23/18   Drug use: Not Currently    Types: Marijuana, Cocaine, Methamphetamines, Heroin    Comment: no cocaine, heroin, meth since 01/23/18. last marijuana use 01-23-18   Sexual activity: Not Currently    Birth control/protection: Surgical    Comment: tubal ligation  Other Topics Concern   Not on file  Social History Narrative   Not on file   Social Determinants of Health   Financial Resource Strain: Not on file  Food Insecurity: Not on file  Transportation Needs: Not on file  Physical Activity: Not on file  Stress: Not on file  Social Connections: Not on file     Family History: The patient's family history includes Cancer in her paternal grandmother; Colon cancer in her maternal grandmother; Colon polyps in her maternal aunt; Diabetes in her father; GER disease in her paternal grandmother; Heart disease in her paternal grandmother; Hypertension in her paternal grandmother; Osteoporosis in her paternal grandmother.  ROS:   Review of Systems  Constitutional:  Negative for chills and fever.  HENT:  Negative for nosebleeds and tinnitus.   Eyes:  Negative for blurred vision and pain.  Respiratory:  Positive for shortness of breath. Negative for cough, hemoptysis and stridor.   Cardiovascular:  Positive for palpitations and leg swelling. Negative for chest pain, orthopnea, claudication and PND.  Gastrointestinal:  Negative for blood in stool, diarrhea, nausea and vomiting.  Genitourinary:  Negative for dysuria and hematuria.  Musculoskeletal:  Negative for falls.  Neurological:  Positive for dizziness. Negative for loss of consciousness and headaches.  Psychiatric/Behavioral:  Negative for depression, hallucinations  and substance abuse. The patient does not have insomnia.     EKGs/Labs/Other Studies Reviewed:    The following studies were reviewed today:  Chest X-ray  06/23/2022: FINDINGS: Stable cardiomediastinal silhouette with normal heart  size. No pneumothorax. No pleural effusion. Lungs appear clear, with no acute consolidative airspace disease and no pulmonary edema.   IMPRESSION: No active disease.  EKG:  EKG is personally reviewed. 07/27/2022:  EKG was not ordered. 06/23/2022 (ED):  Sinus rhythm at 71 bpm. Probable left atrial enlargement.   Recent Labs: 03/11/2022: ALT 11; TSH 0.646 06/23/2022: BUN 17; Creatinine, Ser 0.71; Hemoglobin 11.4; Magnesium 2.1; Platelets 369; Potassium 4.0; Sodium 138   Recent Lipid Panel    Component Value Date/Time   CHOL 169 03/17/2021 1158   TRIG 269 (H) 03/17/2021 1158   HDL 35 (L) 03/17/2021 1158   CHOLHDL 4.8 (H) 03/17/2021 1158   CHOLHDL 2.9 09/05/2017 0621   VLDL 27 09/05/2017 0621   LDLCALC 89 03/17/2021 1158     Risk Assessment/Calculations:                Physical Exam:    VS:  BP 139/77 (BP Location: Left Arm, Patient Position: Sitting, Cuff Size: Large)   Pulse 66   Ht 5' 4.5" (1.638 m)   Wt 289 lb 12.8 oz (131.5 kg)   SpO2 97%   BMI 48.98 kg/m     Wt Readings from Last 3 Encounters:  07/27/22 289 lb 12.8 oz (131.5 kg)  07/08/22 289 lb 9.6 oz (131.4 kg)  06/23/22 295 lb (133.8 kg)     GEN: Well nourished, well developed in no acute distress HEENT: Normal NECK: No JVD; No carotid bruits CARDIAC: RRR, no murmurs, rubs, gallops RESPIRATORY:  Clear to auscultation without rales, wheezing or rhonchi  ABDOMEN: Soft, non-tender, non-distended MUSCULOSKELETAL:  No edema; No deformity  SKIN: Warm and dry NEUROLOGIC:  Alert and oriented x 3 PSYCHIATRIC:  Normal affect   ASSESSMENT:    1. Palpitations   2. Primary hypertension   3. Morbid obesity ([place])   4. Prediabetes    PLAN:    In order of problems listed  above:  #Palpitations: Occurring daily with some associated SOB. Symptoms are worse at night. Had one severe episode that led her to the ED as above. ECG there with NSR but symptoms had improved by that point. She is currently sober with no drug or alcohol use. Has changed diet since ER visit and increase hydration. TSH has been normal. Will check zio monitor and follow-up result.  -Check 3 day zio -Increase hydration -Change metop to coreg for better BP control -TSH has been normal  #HTN: Elevated to 160s at home. BP in office 140s. -Change metop to coreg 73m daily -Start amlodipine 570mdaily -Keep BP log at hom  #Prediabetes: -On metformin -Managed by PCP  #Morbid Obesity: BMI 49. Discussed lifestyle modifications at length. Patient is working hard on diet.  Exercise recommendations: Goal of exercising for at least 30 minutes a day, at least 5 times per week.  Please exercise to a moderate exertion.  This means that while exercising it is difficult to speak in full sentences, however you are not so short of breath that you feel you must stop, and not so comfortable that you can carry on a full conversation.  Exertion level should be approximately a 5/10, if 10 is the most exertion you can perform.  Diet recommendations: Recommend a heart healthy diet such as the Mediterranean diet.  This diet consists of plant based foods, healthy fats, lean meats, olive oil.  It suggests limiting the intake of simple carbohydrates such as white breads, pastries, and pastas.  It also limits the amount of  red meat, wine, and dairy products such as cheese that one should consume on a daily basis.          Follow-up:  6 months.  Medication Adjustments/Labs and Tests Ordered: Current medicines are reviewed at length with the patient today.  Concerns regarding medicines are outlined above.   Orders Placed This Encounter  Procedures   LONG TERM MONITOR (3-14 DAYS)   Meds ordered this encounter   Medications   carvedilol (COREG) 25 MG tablet    Sig: Take 1 tablet (25 mg total) by mouth 2 (two) times daily with a meal.    Dispense:  180 tablet    Refill:  3   amLODipine (NORVASC) 5 MG tablet    Sig: Take 1 tablet (5 mg total) by mouth daily.    Dispense:  90 tablet    Refill:  3   Patient Instructions  Medication Instructions:   STOP TAKING METOPROLOL TARTRATE NOW  START TAKING CARVEDILOL (COREG) 25 MG BY MOUTH TWICE DAILY  START TAKING AMLODIPINE 5 MG BY MOUTH DAILY  *If you need a refill on your cardiac medications before your next appointment, please call your pharmacy*   Testing/Procedures:  ZIO XT- Long Term Monitor Instructions  Your physician has requested you wear a ZIO patch monitor for 3 days.  This is a single patch monitor. [employer] supplies one patch monitor per enrollment. Additional stickers are not available. Please do not apply patch if you will be having a Nuclear Stress Test,  Echocardiogram, Cardiac CT, MRI, or Chest Xray during the period you would be wearing the  monitor. The patch cannot be worn during these tests. You cannot remove and re-apply the  ZIO XT patch monitor.  Your ZIO patch monitor will be mailed 3 day USPS to your address on file. It may take 3-5 days  to receive your monitor after you have been enrolled.  Once you have received your monitor, please review the enclosed instructions. Your monitor  has already been registered assigning a specific monitor serial # to you.  Billing and Patient Assistance Program Information  We have supplied [employer] with any of your insurance information on file for billing purposes. [employer] offers a sliding scale Patient Assistance Program for patients that do not have  insurance, or whose insurance does not completely cover the cost of the ZIO monitor.  You must apply for the Patient Assistance Program to qualify for this discounted rate.  To apply, please call [employer] at [phone], select  option 4, select option 2, ask to apply for  Patient Assistance Program. Theodore Demark will ask your household income, and how many people  are in your household. They will quote your out-of-pocket cost based on that information.  [employer] will also be able to set up a 69-month interest-free payment plan if needed.  Applying the monitor   Shave hair from upper left chest.  Hold abrader disc by orange tab. Rub abrader in 40 strokes over the upper left chest as  indicated in your monitor instructions.  Clean area with 4 enclosed alcohol pads. Let dry.  Apply patch as indicated in monitor instructions. Patch will be placed under collarbone on left  side of chest with arrow pointing upward.  Rub patch adhesive wings for 2 minutes. Remove white label marked "1". Remove the white  label marked "2". Rub patch adhesive wings for 2 additional minutes.  While looking in a mirror, press and release button in center of patch. A small green light  will  flash 3-4 times. This will be your only indicator that the monitor has been turned on.  Do not shower for the first 24 hours. You may shower after the first 24 hours.  Press the button if you feel a symptom. You will hear a small click. Record Date, Time and  Symptom in the Patient Logbook.  When you are ready to remove the patch, follow instructions on the last 2 pages of Patient  Logbook. Stick patch monitor onto the last page of Patient Logbook.  Place Patient Logbook in the blue and white box. Use locking tab on box and tape box closed  securely. The blue and white box has prepaid postage on it. Please place it in the mailbox as  soon as possible. Your physician should have your test results approximately 7 days after the  monitor has been mailed back to [facility].  Call [place] at [phone] if you have questions regarding  your ZIO XT patch monitor. Call them immediately if you see an orange light blinking on your  monitor.   If your monitor falls off in less than 4 days, contact our Monitor department at [phone].  If your monitor becomes loose or falls off after 4 days call [employer] at [phone] for  suggestions on securing your monitor    Follow-Up: At [facility], you and your health needs are our priority.  As part of our continuing mission to provide you with exceptional heart care, we have created designated Provider Care Teams.  These Care Teams include your primary Cardiologist (physician) and Advanced Practice Providers (APPs -  Physician Assistants and Nurse Practitioners) who all work together to provide you with the care you need, when you need it.  We recommend signing up for the patient portal called "MyChart".  Sign up information is provided on this After Visit Summary.  MyChart is used to connect with patients for Virtual Visits (Telemedicine).  Patients are able to view lab/test results, encounter notes, upcoming appointments, etc.  Non-urgent messages can be sent to your provider as well.   To learn more about what you can do with MyChart, go to [email or web address].    Your next appointment:   6 month(s)  The format for your next appointment:   In Person  Provider:   DR. Johney Frame   Important Information About Sugar         I,Mathew Stumpf,acting as a scribe for Freada Bergeron, MD.,have documented all relevant documentation on the behalf of Freada Bergeron, MD,as directed by  Freada Bergeron, MD while in the presence of Freada Bergeron, MD.  I, Freada Bergeron, MD, have reviewed all documentation for this visit. The documentation on 07/27/22 for the exam, diagnosis, procedures, and orders are all accurate and complete.   Signed, Freada Bergeron, MD  07/27/2022 10:59 AM    [place]

## 2022-07-27 NOTE — Progress Notes (Unsigned)
Enrolled patient for a 3 day Zio XT monitor to be mailed to patients home  

## 2022-07-27 NOTE — Telephone Encounter (Signed)
-----   Message from Gerarda Gunther sent at 07/27/2022 10:46 AM EST ----- Regarding: RE: 3 DAY ZIO PER DR. Johney Frame Done :-) ----- Message ----- From: Nuala Alpha, LPN Sent: 15/11/7941  10:27 AM EST To: Jennefer Bravo; Katrina Cloyd Stagers Subject: 3 DAY ZIO PER DR. Johney Frame                    3 day zio for palpitations  Please enroll and let me know  Thanks Karlene Einstein

## 2022-07-28 ENCOUNTER — Encounter: Payer: Self-pay | Admitting: *Deleted

## 2022-07-29 DIAGNOSIS — R002 Palpitations: Secondary | ICD-10-CM

## 2022-07-30 ENCOUNTER — Encounter: Payer: Self-pay | Admitting: Cardiology

## 2022-08-03 ENCOUNTER — Other Ambulatory Visit (HOSPITAL_COMMUNITY): Payer: Self-pay

## 2022-08-04 ENCOUNTER — Other Ambulatory Visit (HOSPITAL_COMMUNITY): Payer: Self-pay

## 2022-08-04 ENCOUNTER — Other Ambulatory Visit: Payer: Self-pay | Admitting: Nurse Practitioner

## 2022-08-04 ENCOUNTER — Other Ambulatory Visit: Payer: Self-pay

## 2022-08-04 MED ORDER — TIZANIDINE HCL 4 MG PO TABS
4.0000 mg | ORAL_TABLET | Freq: Three times a day (TID) | ORAL | 1 refills | Status: AC
  Filled 2022-08-04 (×2): qty 90, 30d supply, fill #0
  Filled 2022-09-27: qty 90, 30d supply, fill #1

## 2022-08-05 ENCOUNTER — Other Ambulatory Visit (HOSPITAL_COMMUNITY): Payer: Self-pay

## 2022-08-05 ENCOUNTER — Other Ambulatory Visit: Payer: Self-pay

## 2022-08-10 ENCOUNTER — Ambulatory Visit (INDEPENDENT_AMBULATORY_CARE_PROVIDER_SITE_OTHER): Payer: No Payment, Other | Admitting: Licensed Clinical Social Worker

## 2022-08-10 DIAGNOSIS — F3341 Major depressive disorder, recurrent, in partial remission: Secondary | ICD-10-CM

## 2022-08-10 NOTE — Progress Notes (Signed)
Virtual Visit via Video Note  I connected with Deborah Barnes on 08/10/22 at  4:30 PM EST by a video enabled telemedicine application and verified that I am speaking with the correct person using two identifiers.  Location: Patient: pt's home Provider: clinical home office   I discussed the limitations of evaluation and management by telemedicine and the availability of in person appointments. The patient expressed understanding and agreed to proceed.   I discussed the assessment and treatment plan with the patient. The patient was provided an opportunity to ask questions and all were answered. The patient agreed with the plan and demonstrated an understanding of the instructions.   The patient was advised to call back or seek an in-person evaluation if the symptoms worsen or if the condition fails to improve as anticipated.  I provided 49 minutes of non-face-to-face time during this encounter.   Heron Nay, LCSWA    THERAPIST PROGRESS NOTE  Session Time: 49 minutes  Participation Level: Active  Behavioral Response: CasualAlertDepressed  Type of Therapy: Individual Therapy  Treatment Goals addressed: depression  ProgressTowards Goals: Progressing  Interventions: Supportive and Reframing  Summary: Deborah Barnes is a 46 y.o. female who presents for f/u with this cln. She arrives on time and maintains appropriate eye contact throughout the session. She reports she lost her job today due to not being physically able to do it anymore because of her back. She states her former boss is asking her what she is able to do, so she may be interested in hiring her. She reports there have been break-ins at the lot which contributed to her termination. She states she is not angry at her employer. "If you can't do the job, you can't do the job." She reports she also found out that her young 19yo cousin, with whom her son lives, recently attempted suicide, and he lives next to the  place where her ex completed suicide and attempted in the same place where her ex died. She reports her son, who found the noose, is reportedly handling it okay. She shares that she knows how it feels to want to die and attempted suicide multiple times "but God kept me here." She states her youngest son is still not speaking to her and she has tried to initiate contact. She states that she is afraid of how it will affect her son if he never forgives her and how he will cope if something happens to her. She reports health issues and had to wear a heart monitor, the results of which she has not gotten yet. She shares that she continues to try to be grateful that she has a home, is safe, and sober. She shares that she chaired a women's only AA meeting last week that she really enjoyed and is going to be doing it again soon. She states she feels genuinely cared about by this [place] group. She states these women make her feel like she is more than "poor white trash." She reports her older son is doing well. She also reports she has gotten close to another resident at her recovery house and recognizes that she has a strong support system even though she sometimes feels alone. She is optimistic that she will find the right job and that things will be fine and that God will take care of her. She discusses her interest in exploring becoming a peer support specialist once she finds a job and gets a new car.  Suicidal/Homicidal: Nowithout intent/plan  Therapist Response: Cln assessed for current stressors, symptoms, and safety since last session. Cln utilized active listening and validation to assist with processing. Cln reframed pt's previous SI and suicide attempts as important moments of her life that allow her to fully appreciate the life she has now. Cln stated her son is lucky to have a mother who works so hard to change and encouraged pt to feel her sadness and anger in addition to the gratitude she feels. Cln asked pt  if she has ever considered becoming an addiction counselor, to which pt said she has and recently researched becoming a peer support specialist. Cln scheduled follow-up appointment and confirmed pt's availability and preferred method of service delivery (virtual).  Plan: Return again in 1 weeks.  Diagnosis: MDD (major depressive disorder), recurrent, in partial remission ([place])  Collaboration of Care: Other none required for this visit  Patient/Guardian was advised Release of Information must be obtained prior to any record release in order to collaborate their care with an outside provider. Patient/Guardian was advised if they have not already done so to contact the registration department to sign all necessary forms in order for [place] to release information regarding their care.   Consent: Patient/Guardian gives verbal consent for treatment and assignment of benefits for services provided during this visit. Patient/Guardian expressed understanding and agreed to proceed.   Heron Nay, LCSWA 08/10/2022

## 2022-08-18 ENCOUNTER — Ambulatory Visit (HOSPITAL_COMMUNITY): Payer: Self-pay | Admitting: Licensed Clinical Social Worker

## 2022-08-18 ENCOUNTER — Telehealth (HOSPITAL_COMMUNITY): Payer: Self-pay | Admitting: Licensed Clinical Social Worker

## 2022-08-18 NOTE — Telephone Encounter (Signed)
See call intake 

## 2022-08-23 ENCOUNTER — Ambulatory Visit (INDEPENDENT_AMBULATORY_CARE_PROVIDER_SITE_OTHER): Payer: Medicaid Other | Admitting: Licensed Clinical Social Worker

## 2022-08-23 DIAGNOSIS — F3341 Major depressive disorder, recurrent, in partial remission: Secondary | ICD-10-CM

## 2022-08-23 DIAGNOSIS — F1990 Other psychoactive substance use, unspecified, uncomplicated: Secondary | ICD-10-CM

## 2022-08-23 NOTE — Progress Notes (Signed)
Virtual Visit via Video Note  I connected with Emogene Z Lento on 08/23/22 at  4:30 PM EST by a video enabled telemedicine application and verified that I am speaking with the correct person using two identifiers.  Location: Patient: pt's home Provider: clinical home office   I discussed the limitations of evaluation and management by telemedicine and the availability of in person appointments. The patient expressed understanding and agreed to proceed.   I discussed the assessment and treatment plan with the patient. The patient was provided an opportunity to ask questions and all were answered. The patient agreed with the plan and demonstrated an understanding of the instructions.   The patient was advised to call back or seek an in-person evaluation if the symptoms worsen or if the condition fails to improve as anticipated.  I provided 50 minutes of non-face-to-face time during this encounter.   Heron Nay, LCSWA    THERAPIST PROGRESS NOTE  Session Time: 50 minutes  Participation Level: Active  Behavioral Response: CasualAlertMildly Depressed  Type of Therapy: Individual Therapy  Treatment Goals addressed: coping  ProgressTowards Goals: Progressing  Interventions: Reframing and Other: Person-centered therapy  Summary: Mckaila Z Witcher is a 46 y.o. female who presents for f/u with this cln. She arrives on time and maintains appropriate eye contact throughout the session. She reports she started working at [employer] again part time. She reports "it's been a mess around here." She reports in addition to the aforementioned stressors, her house mate (with whom she graduated from [employer]) relapsed. She states this really upset the house and she and another house mate had to pack her things. She reports a newer house mate kept a flat iron of hers, and was dismissed from the house. She shares her thoughts and feelings related to this loss and is able to process. She  is receptive to reframing feedback from cln. She engages in discussion with cln regarding how addiction is viewed and treated in the [place] and how this is detrimental to the recovery process. She shares the analogy of addiction being like an allergy, in that some people have an "allergic reaction" to drugs and/or alcohol, with the compulsion to use being the reaction.  Suicidal/Homicidal: Nowithout intent/plan  Therapist Response: Cln assessed for current stressors, symptoms, and safety since last session. Cln utilized active listening and validation to assist with processing. Cln reiterated pt's strengths and positive outlook and praised her continued sobriety and progress in therapy. Cln reframed pt's cognition about "losing" her house mate as her house mate being at a different point in recovery temporarily. Cln validated the grief pt feels regarding her house mate. Cln scheduled follow-up appointment and confirmed pt's availability and preferred method of service delivery (virtual).  Plan: Return again in 1 weeks.  Diagnosis: MDD (major depressive disorder), recurrent, in partial remission ([place])  Collaboration of Care: Other none required for this visit  Patient/Guardian was advised Release of Information must be obtained prior to any record release in order to collaborate their care with an outside provider. Patient/Guardian was advised if they have not already done so to contact the registration department to sign all necessary forms in order for [place] to release information regarding their care.   Consent: Patient/Guardian gives verbal consent for treatment and assignment of benefits for services provided during this visit. Patient/Guardian expressed understanding and agreed to proceed.   Heron Nay, LCSWA 08/23/2022

## 2022-08-24 ENCOUNTER — Other Ambulatory Visit: Payer: Self-pay

## 2022-08-29 ENCOUNTER — Ambulatory Visit (INDEPENDENT_AMBULATORY_CARE_PROVIDER_SITE_OTHER): Payer: Medicaid Other | Admitting: Licensed Clinical Social Worker

## 2022-08-29 DIAGNOSIS — F3341 Major depressive disorder, recurrent, in partial remission: Secondary | ICD-10-CM | POA: Diagnosis not present

## 2022-08-29 NOTE — Progress Notes (Signed)
Virtual Visit via Video Note  I connected with Khristin Z Hauschild on 08/29/22 at  9:00 AM EST by a video enabled telemedicine application and verified that I am speaking with the correct person using two identifiers.  Location: Patient: pt's home Provider: clinical office   I discussed the limitations of evaluation and management by telemedicine and the availability of in person appointments. The patient expressed understanding and agreed to proceed.   I discussed the assessment and treatment plan with the patient. The patient was provided an opportunity to ask questions and all were answered. The patient agreed with the plan and demonstrated an understanding of the instructions.   The patient was advised to call back or seek an in-person evaluation if the symptoms worsen or if the condition fails to improve as anticipated.  I provided 47 minutes of non-face-to-face time during this encounter.   Heron Nay, LCSWA    THERAPIST PROGRESS NOTE  Session Time: 47 minutes  Participation Level: Active  Behavioral Response: CasualAlertDepressed  Type of Therapy: Individual Therapy  Treatment Goals addressed: depression  ProgressTowards Goals: Progressing  Interventions: Supportive and Other: person-centered therapy  Summary: Deborah Barnes is a 46 y.o. female who presents with for f/u with this cln. She arrives on time and maintains appropriate eye contact throughout the session. She reports she is not feeling well and she is missing her son. She states she is feeling overwhelmed due to being stuck in the house and having so many things that need to be done. She reports she logged onto facebook and was shown a memory of her youngest son from two years ago when she and him went out to dinner. She also reports she and her older son got into a "religious disagreement" last night and that she missed out on a church meeting due to her ride falling through. She also reports she accepted  an invitation to go to [facility] for Christmas to take another house mate's grandson's presents. She states she prefers this to being alone at the house as she usually is on holidays. She states she also accepted a dinner invitation through [place]. Regarding the disagreement with her oldest son, she states she made a somewhat controversial post and her son commented about how Christmas was a pagan holiday. She states she deleted his comment and asked him not to post comments like that on her page and they got into an argument. She reports her son continuously pushed his side of the argument despite her repeatedly asking him to stop, and eventually she chose to stop talking to him. She also reports increased anxiety. She also states she was talking to house mates about how she was told as a child about how violent her mother was and how she was murdered and how her father was killed in a car accident, and she was shown the picture of his crushed car. She states the women she was talking to responded, "No wonder you're so anxious." She recalls how she felt like a burden as a child due to . She states she wonders if her younger son isn't speaking to her because he found out about something shameful she did, which she shares with cln. "Maybe I need to do a fourth step on myself." She explains the fourth step to cln and agrees to work on it and share with cln in the next session.  Suicidal/Homicidal: Nowithout intent/plan  Therapist Response: Cln assessed for current stressors, symptoms, and safety since last  session. Cln utilized active listening and validation to assist with processing. Cln inquired about pt's suspicions surrounding why her son does not speak to her and explored her shame surrounding it. Cln encouraged pt to work towards forgiveness and reducing shame. Cln praised pt for sharing this with cln. Cln scheduled follow-up appointment and confirmed pt's availability and preferred method of service  delivery (virtual).  Plan: Return again in 1 weeks.  Diagnosis: MDD (major depressive disorder), recurrent, in partial remission ([place])  Collaboration of Care: Other none required for this visit  Patient/Guardian was advised Release of Information must be obtained prior to any record release in order to collaborate their care with an outside provider. Patient/Guardian was advised if they have not already done so to contact the registration department to sign all necessary forms in order for [place] to release information regarding their care.   Consent: Patient/Guardian gives verbal consent for treatment and assignment of benefits for services provided during this visit. Patient/Guardian expressed understanding and agreed to proceed.   Heron Nay, LCSWA 08/29/2022

## 2022-09-06 ENCOUNTER — Ambulatory Visit (INDEPENDENT_AMBULATORY_CARE_PROVIDER_SITE_OTHER): Payer: Medicaid Other | Admitting: Licensed Clinical Social Worker

## 2022-09-06 DIAGNOSIS — F3341 Major depressive disorder, recurrent, in partial remission: Secondary | ICD-10-CM

## 2022-09-06 NOTE — Progress Notes (Signed)
Virtual Visit via Video Note  I connected with Deborah Barnes on 09/06/22 at  4:30 PM EST by a video enabled telemedicine application and verified that I am speaking with the correct person using two identifiers.  Location: Patient: Pt's home Provider: clinical home office   I discussed the limitations of evaluation and management by telemedicine and the availability of in person appointments. The patient expressed understanding and agreed to proceed.   I discussed the assessment and treatment plan with the patient. The patient was provided an opportunity to ask questions and all were answered. The patient agreed with the plan and demonstrated an understanding of the instructions.   The patient was advised to call back or seek an in-person evaluation if the symptoms worsen or if the condition fails to improve as anticipated.  I provided 55 minutes of non-face-to-face time during this encounter.   Heron Nay, LCSWA    THERAPIST PROGRESS NOTE  Session Time: 55 minutes  Participation Level: Active  Behavioral Response: CasualAlertCheerful  Type of Therapy: Individual Therapy  Treatment Goals addressed: thinking patterns  ProgressTowards Goals: Progressing  Interventions: Other: person-centered therapy  and feminist therapy  Summary: [name] is a 46 y.o. female who presents for f/u with this cln. She arrives on time and maintains appropriate eye contact throughout the session. She reports she reconnected with her first husband, with whom she married and divorced when she was 71. She states they are both sober and [place] and they reconnected via [place]. She states he currently lives in [place], "which is a blessing because we can't rush into anything." She states he is coming to visit his mother in [place] in February. She talks about how right it feels, how she and he have forgiven one another. She also reports talking to him has helped her forgive herself  apropos to last week's session. She reports feeling lighter and deserving of love. She talks about the Christmas party she went to and how much of a good time she had. She also reports she will be going on a work trip to [place] in September. She talks about wanting to lose weight to manage pain and to improve self-esteem. She is receptive to feedback.  Suicidal/Homicidal: Nowithout intent/plan  Therapist Response: Cln assessed for current stressors, symptoms, and safety since last session. Cln utilized active listening and validation to assist with processing. Cln expressed happiness and support for pt in reconnecting with her first husband. Cln praised pt for stepping out of her comfort zone and socializing at the Christmas party. Cln discussed sexism in health care, particularly surrounding weight. Cln  encouraged pt to f/u with a weight loss provider now that the Medicaid expansion has occurred and shared personal experience with weight loss providers as being compassionate. Cln emailed pt flyer with info regarding expansion. Cln scheduled follow-up appointment and confirmed pt's availability and preferred method of service delivery (virtual).   Plan: Return again in 3 weeks.  Diagnosis: MDD (major depressive disorder), recurrent, in partial remission ([place])  Collaboration of Care: Other none required for this visit  Patient/Guardian was advised Release of Information must be obtained prior to any record release in order to collaborate their care with an outside provider. Patient/Guardian was advised if they have not already done so to contact the registration department to sign all necessary forms in order for [place] to release information regarding their care.   Consent: Patient/Guardian gives verbal consent for treatment and assignment of benefits for services  provided during this visit. Patient/Guardian expressed understanding and agreed to proceed.   Heron Nay, LCSWA 09/06/2022

## 2022-09-14 ENCOUNTER — Other Ambulatory Visit: Payer: Self-pay

## 2022-09-14 ENCOUNTER — Ambulatory Visit: Payer: Self-pay | Admitting: Nurse Practitioner

## 2022-09-15 ENCOUNTER — Other Ambulatory Visit: Payer: Self-pay

## 2022-09-15 ENCOUNTER — Other Ambulatory Visit (HOSPITAL_COMMUNITY): Payer: Self-pay

## 2022-09-16 ENCOUNTER — Other Ambulatory Visit: Payer: Self-pay

## 2022-09-18 ENCOUNTER — Other Ambulatory Visit: Payer: Self-pay | Admitting: Nurse Practitioner

## 2022-09-18 ENCOUNTER — Other Ambulatory Visit (HOSPITAL_COMMUNITY): Payer: Self-pay

## 2022-09-18 ENCOUNTER — Other Ambulatory Visit: Payer: Self-pay | Admitting: Internal Medicine

## 2022-09-18 DIAGNOSIS — K921 Melena: Secondary | ICD-10-CM

## 2022-09-18 DIAGNOSIS — K259 Gastric ulcer, unspecified as acute or chronic, without hemorrhage or perforation: Secondary | ICD-10-CM

## 2022-09-18 DIAGNOSIS — R1011 Right upper quadrant pain: Secondary | ICD-10-CM

## 2022-09-18 DIAGNOSIS — E039 Hypothyroidism, unspecified: Secondary | ICD-10-CM

## 2022-09-18 MED ORDER — OMEPRAZOLE 40 MG PO CPDR
40.0000 mg | DELAYED_RELEASE_CAPSULE | Freq: Two times a day (BID) | ORAL | 0 refills | Status: AC
  Filled 2022-09-18 – 2022-10-07 (×3): qty 112, 56d supply, fill #0

## 2022-09-19 ENCOUNTER — Other Ambulatory Visit (HOSPITAL_COMMUNITY): Payer: Self-pay

## 2022-09-20 ENCOUNTER — Other Ambulatory Visit: Payer: Self-pay

## 2022-09-27 ENCOUNTER — Other Ambulatory Visit (HOSPITAL_COMMUNITY): Payer: Self-pay

## 2022-09-28 ENCOUNTER — Ambulatory Visit (HOSPITAL_COMMUNITY): Payer: Self-pay | Admitting: Licensed Clinical Social Worker

## 2022-09-28 ENCOUNTER — Other Ambulatory Visit: Payer: Self-pay

## 2022-10-06 ENCOUNTER — Other Ambulatory Visit (HOSPITAL_COMMUNITY): Payer: Self-pay

## 2022-10-06 MED ORDER — LEVOTHYROXINE SODIUM 50 MCG PO TABS
50.0000 ug | ORAL_TABLET | Freq: Every day | ORAL | 1 refills | Status: AC
  Filled 2022-10-06 – 2022-10-07 (×3): qty 90, 90d supply, fill #0

## 2022-10-07 ENCOUNTER — Other Ambulatory Visit: Payer: Self-pay

## 2022-10-07 ENCOUNTER — Encounter: Payer: Self-pay | Admitting: Nurse Practitioner

## 2022-10-07 ENCOUNTER — Telehealth: Payer: Self-pay

## 2022-10-07 ENCOUNTER — Ambulatory Visit: Payer: Medicaid Other | Admitting: Nurse Practitioner

## 2022-10-07 ENCOUNTER — Other Ambulatory Visit (HOSPITAL_COMMUNITY): Payer: Self-pay

## 2022-10-07 VITALS — BP 148/94 | HR 77 | Temp 97.9°F | Ht 63.25 in | Wt 289.4 lb

## 2022-10-07 DIAGNOSIS — M5441 Lumbago with sciatica, right side: Secondary | ICD-10-CM

## 2022-10-07 DIAGNOSIS — E039 Hypothyroidism, unspecified: Secondary | ICD-10-CM | POA: Diagnosis not present

## 2022-10-07 DIAGNOSIS — G8929 Other chronic pain: Secondary | ICD-10-CM

## 2022-10-07 MED ORDER — GABAPENTIN 100 MG PO CAPS
100.0000 mg | ORAL_CAPSULE | Freq: Three times a day (TID) | ORAL | 3 refills | Status: DC
  Filled 2022-10-07 (×3): qty 90, 30d supply, fill #0
  Filled 2022-11-01: qty 90, 30d supply, fill #1
  Filled 2022-12-06 (×2): qty 90, 30d supply, fill #0

## 2022-10-07 NOTE — Progress Notes (Signed)
$'@Patient'n$  ID: Deborah Barnes, female    DOB: 28-Apr-1976, 47 y.o.   MRN: [id number]  Chief Complaint  Patient presents with   Follow-up    Increased pain from switching jobs. Check her fingernail pointer right hand.     Referring provider: Fenton Foy, NP   HPI  Patient presents today for routine follow-up.  Overall she states that she has been doing really well since her last visit here.  She does complain of ongoing issues with pain to her extremities.  She has reconciled with her ex-husband and will be getting remarried this summer.  She may be moving to [place] at that time. Denies f/c/s, n/v/d, hemoptysis, PND, leg swelling Denies chest pain or edema    Allergies  Allergen Reactions   Clarithromycin Diarrhea    abd pain   Propoxyphene Itching    Immunization History  Administered Date(s) Administered   Influenza Inj Mdck Quad Pf 07/21/2018   Influenza,inj,Quad PF,6+ Mos 05/29/2019   Pneumococcal Polysaccharide-23 04/26/2013   Tdap 11/01/2018    Past Medical History:  Diagnosis Date   Asthma    Back pain    Bipolar disorder (HCC)    DX in the past but not currently be treated   Bulging disc    Complication of anesthesia 2014   anesthesia ? caused depression/suicidal ideation per pt.   COPD (chronic obstructive pulmonary disease) (HCC)    De Quervain's tenosynovitis, right 10/2019   surgery   Degenerative disc disease    Depression    Depression with anxiety    DJD (degenerative joint disease)    right hip   GERD (gastroesophageal reflux disease)    Hepatitis C 08/2017   TX for HEP C   Hip pain, right    History of alcohol abuse    History of IBS    History of mixed drug abuse ([place])    Hypercholesterolemia    Hypertension    Hypoglycemia    Hypothyroidism    PTSD (post-traumatic stress disorder)    Sciatica    Sciatica 01/28/2022   Shortness of breath    Thyroid disease    Vitamin D deficiency 02/2020   Walker as ambulation aid      Tobacco History: Social History   Tobacco Use  Smoking Status Former   Packs/day: 0.50   Years: 27.00   Total pack years: 13.50   Types: Cigarettes   Quit date: 08/24/2018   Years since quitting: 4.1  Smokeless Tobacco Current  Tobacco Comments   vape former user   Ready to quit: Not Answered Counseling given: Not Answered Tobacco comments: vape former user   Outpatient Encounter Medications as of 10/07/2022  Medication Sig   gabapentin (NEURONTIN) 100 MG capsule Take 1 capsule (100 mg total) by mouth 3 (three) times daily.   acetaminophen (TYLENOL) 500 MG tablet Take 500 mg by mouth every 6 (six) hours as needed.   albuterol (PROVENTIL HFA) 108 (90 Base) MCG/ACT inhaler Inhale 1-2 puffs into the lungs every 6 (six) hours as needed for wheezing or shortness of breath.   amLODipine (NORVASC) 5 MG tablet Take 1 tablet (5 mg total) by mouth daily.   blood glucose meter kit and supplies Dispense based on patient and insurance preference. Use up to four times daily as directed. (FOR ICD-10 E10.9, E11.9).   carvedilol (COREG) 25 MG tablet Take 1 tablet (25 mg total) by mouth 2 (two) times daily with a meal.   citalopram (CELEXA) 20 MG  tablet Take 1 tablet (20 mg total) by mouth daily.   colestipol (COLESTID) 1 g tablet Take 2 tablets (2 g total) by mouth 2 (two) times daily.   hydrOXYzine (ATARAX) 10 MG tablet Take 1 tablet (10 mg total) by mouth 3 (three) times daily as needed.   ibuprofen (ADVIL) 200 MG tablet Take 200 mg by mouth every 6 (six) hours as needed for mild pain.   levothyroxine (SYNTHROID) 50 MCG tablet Take 1 tablet (50 mcg total) by mouth daily before breakfast.   meloxicam (MOBIC) 15 MG tablet Take 1 tablet (15 mg total) by mouth daily.   metFORMIN (GLUCOPHAGE-XR) 750 MG 24 hr tablet Take 1 tablet (750 mg total) by mouth daily with breakfast.   omeprazole (PRILOSEC) 40 MG capsule Take 1 capsule (40 mg total) by mouth in the morning and at bedtime.   tiZANidine  (ZANAFLEX) 4 MG tablet Take 1 tablet (4 mg total) by mouth 3 (three) times daily.   triamcinolone cream (KENALOG) 0.1 % Apply topically 2 (two) times daily.   Facility-Administered Encounter Medications as of 10/07/2022  Medication   0.9 %  sodium chloride infusion     Review of Systems  Review of Systems  Constitutional: Negative.   HENT: Negative.    Cardiovascular: Negative.   Gastrointestinal: Negative.   Allergic/Immunologic: Negative.   Neurological: Negative.   Psychiatric/Behavioral: Negative.         Physical Exam  BP (!) 148/94   Pulse 77   Temp 97.9 F (36.6 C) (Temporal)   Ht 5' 3.25" (1.607 m)   Wt 289 lb 6.4 oz (131.3 kg)   SpO2 100%   BMI 50.86 kg/m   Wt Readings from Last 5 Encounters:  10/07/22 289 lb 6.4 oz (131.3 kg)  07/27/22 289 lb 12.8 oz (131.5 kg)  07/08/22 289 lb 9.6 oz (131.4 kg)  06/23/22 295 lb (133.8 kg)  06/13/22 293 lb 6 oz (133.1 kg)     Physical Exam Vitals and nursing note reviewed.  Constitutional:      General: She is not in acute distress.    Appearance: She is well-developed.  Cardiovascular:     Rate and Rhythm: Normal rate and regular rhythm.  Pulmonary:     Effort: Pulmonary effort is normal.     Breath sounds: Normal breath sounds.  Neurological:     Mental Status: She is alert and oriented to person, place, and time.      Lab Results:  CBC    Component Value Date/Time   WBC 9.9 10/07/2022 1515   WBC 9.0 06/23/2022 1912   RBC 3.96 10/07/2022 1515   RBC 3.93 06/23/2022 1912   HGB 11.1 10/07/2022 1515   HCT 34.0 10/07/2022 1515   PLT 383 10/07/2022 1515   MCV 86 10/07/2022 1515   MCH 28.0 10/07/2022 1515   MCH 29.0 06/23/2022 1912   MCHC 32.6 10/07/2022 1515   MCHC 32.6 06/23/2022 1912   RDW 13.8 10/07/2022 1515   LYMPHSABS 3.1 06/23/2022 1912   LYMPHSABS 2.1 12/08/2021 1059   MONOABS 0.6 06/23/2022 1912   EOSABS 0.3 06/23/2022 1912   EOSABS 0.3 12/08/2021 1059   BASOSABS 0.0 06/23/2022 1912    BASOSABS 0.0 12/08/2021 1059    BMET    Component Value Date/Time   NA 139 10/07/2022 1515   K 4.1 10/07/2022 1515   CL 103 10/07/2022 1515   CO2 20 10/07/2022 1515   GLUCOSE 101 (H) 10/07/2022 1515   GLUCOSE 91 06/23/2022 1912  BUN 11 10/07/2022 1515   CREATININE 0.74 10/07/2022 1515   CALCIUM 9.6 10/07/2022 1515   GFRNONAA >60 06/23/2022 1912   GFRAA >60 04/28/2020 0624    BNP No results found for: "BNP"  ProBNP No results found for: "PROBNP"  Imaging: No results found.   Assessment & Plan:   Chronic midline low back pain with right-sided sciatica - gabapentin (NEURONTIN) 100 MG capsule; Take 1 capsule (100 mg total) by mouth 3 (three) times daily.  Dispense: 90 capsule; Refill: 3  Follow up:  Follow up in 3 months     Fenton Foy, NP 10/10/2022

## 2022-10-07 NOTE — Telephone Encounter (Signed)
Patient needs a letter from the provider for her to have this medication since she lives in a sober living house. She said to post it to her mychart when we get it done.  Thank you

## 2022-10-07 NOTE — Patient Instructions (Addendum)
1. Chronic midline low back pain with right-sided sciatica  - gabapentin (NEURONTIN) 100 MG capsule; Take 1 capsule (100 mg total) by mouth 3 (three) times daily.  Dispense: 90 capsule; Refill: 3  Follow up:  Follow up in 3 months

## 2022-10-08 LAB — CBC
Hematocrit: 34 % (ref 34.0–46.6)
Hemoglobin: 11.1 g/dL (ref 11.1–15.9)
MCH: 28 pg (ref 26.6–33.0)
MCHC: 32.6 g/dL (ref 31.5–35.7)
MCV: 86 fL (ref 79–97)
Platelets: 383 10*3/uL (ref 150–450)
RBC: 3.96 x10E6/uL (ref 3.77–5.28)
RDW: 13.8 % (ref 11.7–15.4)
WBC: 9.9 10*3/uL (ref 3.4–10.8)

## 2022-10-08 LAB — COMPREHENSIVE METABOLIC PANEL
ALT: 14 IU/L (ref 0–32)
AST: 12 IU/L (ref 0–40)
Albumin/Globulin Ratio: 1.7 (ref 1.2–2.2)
Albumin: 4.3 g/dL (ref 3.9–4.9)
Alkaline Phosphatase: 84 IU/L (ref 44–121)
BUN/Creatinine Ratio: 15 (ref 9–23)
BUN: 11 mg/dL (ref 6–24)
Bilirubin Total: 0.2 mg/dL (ref 0.0–1.2)
CO2: 20 mmol/L (ref 20–29)
Calcium: 9.6 mg/dL (ref 8.7–10.2)
Chloride: 103 mmol/L (ref 96–106)
Creatinine, Ser: 0.74 mg/dL (ref 0.57–1.00)
Globulin, Total: 2.6 g/dL (ref 1.5–4.5)
Glucose: 101 mg/dL — ABNORMAL HIGH (ref 70–99)
Potassium: 4.1 mmol/L (ref 3.5–5.2)
Sodium: 139 mmol/L (ref 134–144)
Total Protein: 6.9 g/dL (ref 6.0–8.5)
eGFR: 100 mL/min/{1.73_m2} (ref >59)

## 2022-10-08 LAB — TSH: TSH: 0.966 u[IU]/mL (ref 0.450–4.500)

## 2022-10-10 ENCOUNTER — Encounter: Payer: Self-pay | Admitting: Nurse Practitioner

## 2022-10-10 NOTE — Assessment & Plan Note (Signed)
-  gabapentin (NEURONTIN) 100 MG capsule; Take 1 capsule (100 mg total) by mouth 3 (three) times daily.  Dispense: 90 capsule; Refill: 3  Follow up:  Follow up in 3 months

## 2022-10-11 ENCOUNTER — Encounter (HOSPITAL_BASED_OUTPATIENT_CLINIC_OR_DEPARTMENT_OTHER): Payer: Medicaid Other | Admitting: Advanced Practice Midwife

## 2022-10-12 ENCOUNTER — Encounter: Payer: Self-pay | Admitting: Nurse Practitioner

## 2022-10-17 ENCOUNTER — Other Ambulatory Visit: Payer: Self-pay

## 2022-10-17 ENCOUNTER — Other Ambulatory Visit (HOSPITAL_COMMUNITY): Payer: Self-pay

## 2022-10-20 ENCOUNTER — Other Ambulatory Visit (HOSPITAL_COMMUNITY): Payer: Self-pay

## 2022-10-21 ENCOUNTER — Ambulatory Visit (INDEPENDENT_AMBULATORY_CARE_PROVIDER_SITE_OTHER): Payer: Medicaid Other

## 2022-10-21 ENCOUNTER — Encounter: Payer: Self-pay | Admitting: Orthopaedic Surgery

## 2022-10-21 ENCOUNTER — Ambulatory Visit (INDEPENDENT_AMBULATORY_CARE_PROVIDER_SITE_OTHER): Payer: Medicaid Other | Admitting: Orthopaedic Surgery

## 2022-10-21 VITALS — BP 128/93 | HR 74 | Ht 63.25 in | Wt 289.0 lb

## 2022-10-21 DIAGNOSIS — M25511 Pain in right shoulder: Secondary | ICD-10-CM

## 2022-10-21 DIAGNOSIS — M25552 Pain in left hip: Secondary | ICD-10-CM

## 2022-10-21 DIAGNOSIS — G8929 Other chronic pain: Secondary | ICD-10-CM

## 2022-10-21 MED ORDER — METHYLPREDNISOLONE ACETATE 40 MG/ML IJ SUSP
40.0000 mg | INTRAMUSCULAR | Status: AC | PRN
  Administered 2022-10-21: 40 mg via INTRA_ARTICULAR

## 2022-10-21 MED ORDER — LIDOCAINE HCL 1 % IJ SOLN
0.5000 mL | INTRAMUSCULAR | Status: AC | PRN
  Administered 2022-10-21: .5 mL

## 2022-10-21 MED ORDER — BUPIVACAINE HCL 0.25 % IJ SOLN
4.0000 mL | INTRAMUSCULAR | Status: AC | PRN
  Administered 2022-10-21: 4 mL via INTRA_ARTICULAR
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
## 2022-10-24 ENCOUNTER — Ambulatory Visit (INDEPENDENT_AMBULATORY_CARE_PROVIDER_SITE_OTHER): Payer: Medicaid Other | Admitting: Licensed Clinical Social Worker

## 2022-10-24 DIAGNOSIS — F3341 Major depressive disorder, recurrent, in partial remission: Secondary | ICD-10-CM
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
## 2022-10-25 ENCOUNTER — Encounter: Payer: Self-pay | Admitting: Orthopaedic Surgery

## 2022-10-26 NOTE — Telephone Encounter (Signed)
noted 

## 2022-11-01 ENCOUNTER — Other Ambulatory Visit: Payer: Self-pay

## 2022-11-07 ENCOUNTER — Ambulatory Visit (INDEPENDENT_AMBULATORY_CARE_PROVIDER_SITE_OTHER): Payer: Medicaid Other | Admitting: Licensed Clinical Social Worker

## 2022-11-07 DIAGNOSIS — F3341 Major depressive disorder, recurrent, in partial remission: Secondary | ICD-10-CM
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
## 2022-11-09 NOTE — Progress Notes (Deleted)
Cardiology Office Note:    Date:  11/09/2022   ID:  Deborah Barnes, DOB [birth year], MRN [id number]  PCP:  Fenton Foy, NP   [facility] Providers Cardiologist:  None { Click to update primary MD,subspecialty MD or APP then REFRESH:1}    Referring MD: Fenton Foy, NP   Chief Complaint: ***  History of Present Illness:    Deborah Barnes is a *** 47 y.o. female with a hx of bipolar disorder, anxiety, COPD, HTN, polysubstance abuse, palpitations  She was referred to cardiology for evaluation of palpitations and seen by Dr. Johney Frame on 07/27/2022.  Seen in [facility] ER on 06/23/2022. She presented with palpitations. BP very elevated on presentation at 200/100.  ECG with NSR HR 71, troponin negative x 2, CBC, BMET, urine tox and mag unremarkable.  At office visit, she reported palpitations have been intermittent for years, however on the day she presented to ER she felt like palpitations would not stop. They began to subside after arriving in the ER. She did have some associated lightheadedness and shortness of breath. Noted she was under a lot of stress the previous few weeks and is premenopausal. Continuing to have palpitations daily.  Home BP consistently [id number] and [id number] systolic. She reported she had been working on dietary changes, cutting back on sugar, carbs, and sodium.  Also focusing on eating more vegetables. Had recently been told she is prediabetic. Exercise is limited by hip pain and spinal stenosis. She reported 4 years of sobriety.   At office visit, metoprolol was changed to carvedilol for better BP control.  Amlodipine 5 mg daily was added additionally.  3-day ZIO monitor revealed occasional PVCs which correlated to symptoms.  She reported feeling better on carvedilol and was advised to return in 3 months for follow-up.  Today, she is here   Past Medical History:  Diagnosis Date   Asthma    Back pain    Bipolar disorder ([place])    DX in the past but not currently be  treated   Bulging disc    Complication of anesthesia 2014   anesthesia ? caused depression/suicidal ideation per pt.   COPD (chronic obstructive pulmonary disease) ([place])    De Quervain's tenosynovitis, right 10/2019   surgery   Degenerative disc disease    Depression    Depression with anxiety    DJD (degenerative joint disease)    right hip   GERD (gastroesophageal reflux disease)    Hepatitis C 08/2017   TX for HEP C   Hip pain, right    History of alcohol abuse    History of IBS    History of mixed drug abuse ([place])    Hypercholesterolemia    Hypertension    Hypoglycemia    Hypothyroidism    PTSD (post-traumatic stress disorder)    Sciatica    Sciatica 01/28/2022   Shortness of breath    Thyroid disease    Vitamin D deficiency 02/2020   Walker as ambulation aid     Past Surgical History:  Procedure Laterality Date   BIOPSY N/A 09/25/2014   Procedure: BIOPSY;  Surgeon: Daneil Dolin, MD;  Location: [facility];  Service: Endoscopy;  Laterality: N/A;  Gastric, Ascending Colon, Descending/Sigmoid Colon    CARPAL TUNNEL RELEASE Bilateral    CHOLECYSTECTOMY N/A 04/26/2013   Procedure: LAPAROSCOPIC CHOLECYSTECTOMY;  Surgeon: Jamesetta So, MD;  Location: [facility];  Service: General;  Laterality: N/A;   COLONOSCOPY WITH PROPOFOL N/A 09/25/2014  RMR: Pancolonic diverticulosis. multiple tubular adenomas, segmental biopsies negative. surveillance in 5 years   COLONOSCOPY WITH PROPOFOL N/A 01/16/2020   diverticulosis, two 4-6 mm polyps in sigmoid and splenic flexure, one 10 mm polyp in sigmoid Tubular adenomas, 3 year surveillance.   DORSAL COMPARTMENT RELEASE Right 10/30/2019   Procedure: RIGHT WRIST 1ST  DORSAL COMPARTMENT RELEASE (DEQUERVAIN);  Surgeon: Marybelle Killings, MD;  Location: [place];  Service: Orthopedics;  Laterality: Right;   ESOPHAGOGASTRODUODENOSCOPY (EGD) WITH PROPOFOL N/A 09/25/2014   RMR: Normal esophagus. Abnormal gastric mucosa status post biopsy (negative H.pylori). Hiatal  hernia.    ESOPHAGOGASTRODUODENOSCOPY (EGD) WITH PROPOFOL N/A 01/16/2020   Normal esophagus s/p dilation, normal stomach and duodenal bulb.    INCISIONAL HERNIA REPAIR N/A 10/04/2013   Procedure: HERNIA REPAIR INCISIONAL WITH MESH;  Surgeon: Jamesetta So, MD;  Location: [facility];  Service: General;  Laterality: N/A;   INSERTION OF MESH N/A 10/04/2013   Procedure: INSERTION OF MESH;  Surgeon: Jamesetta So, MD;  Location: [facility];  Service: General;  Laterality: N/A;   MALONEY DILATION N/A 01/16/2020   Procedure: Keturah Shavers;  Surgeon: Daneil Dolin, MD;  [facility];  Service: Endoscopy;  Laterality: N/A;   POLYPECTOMY N/A 09/25/2014   Procedure: POLYPECTOMY;  Surgeon: Daneil Dolin, MD;  Location: [facility];  Service: Endoscopy;  Laterality: N/A;  Cecal, Ascending Colon    POLYPECTOMY  01/16/2020   Procedure: POLYPECTOMY;  Surgeon: Daneil Dolin, MD;  [facility];  Service: Endoscopy;;   SHOULDER SURGERY Left    TOOTH EXTRACTION  08/09/2019   TOTAL HIP ARTHROPLASTY Right 04/27/2020   Procedure: TOTAL HIP ARTHROPLASTY DIRECT ANTERIOR;  Surgeon: Marybelle Killings, MD;  Location: [place];  Service: Orthopedics;  Laterality: Right;   TUBAL LIGATION     WISDOM TOOTH EXTRACTION      Current Medications: No outpatient medications have been marked as taking for the 11/21/22 encounter (Appointment) with Ann Maki, Lanice Schwab, NP.   Current Facility-Administered Medications for the 11/21/22 encounter (Appointment) with Arpan Eskelson, Lanice Schwab, NP  Medication   0.9 %  sodium chloride infusion     Allergies:   Clarithromycin and Propoxyphene   Social History   Socioeconomic History   Marital status: Married    Spouse name: Not on file   Number of children: 2   Years of education: Not on file   Highest education level: Not on file  Occupational History   Occupation: Radio broadcast assistant  Tobacco Use   Smoking status: Former    Packs/day: 0.50    Years: 27.00    Total pack years:  13.50    Types: Cigarettes    Quit date: 08/24/2018    Years since quitting: 4.2   Smokeless tobacco: Current   Tobacco comments:    vape former user  Vaping Use   Vaping Use: Every day   Last attempt to quit: 08/24/2018   Substances: Nicotine  Substance and Sexual Activity   Alcohol use: Not Currently    Alcohol/week: 1.0 standard drink of alcohol    Types: 1 Cans of beer per week    Comment: hx binge drinker. none since 01/23/18   Drug use: Not Currently    Types: Marijuana, Cocaine, Methamphetamines, Heroin    Comment: no cocaine, heroin, meth since 01/23/18. last marijuana use 01-23-18   Sexual activity: Not Currently    Birth control/protection: Surgical    Comment: tubal ligation  Other Topics Concern   Not  on file  Social History Narrative   Not on file   Social Determinants of Health   Financial Resource Strain: Not on file  Food Insecurity: Not on file  Transportation Needs: Not on file  Physical Activity: Not on file  Stress: Not on file  Social Connections: Not on file     Family History: The patient's ***family history includes Cancer in her paternal grandmother; Colon cancer in her maternal grandmother; Colon polyps in her maternal aunt; Diabetes in her father; GER disease in her paternal grandmother; Heart disease in her paternal grandmother; Hypertension in her paternal grandmother; Osteoporosis in her paternal grandmother.  ROS:   Please see the history of present illness.    *** All other systems reviewed and are negative.  Labs/Other Studies Reviewed:    The following studies were reviewed today:  Cardiac Monitor 08/15/22  Patch wear time was 2 days and 23 hours   Predominant rhythm was NSR with average HR 75bpm   2 runs of nonsustained SVT with longest lasting 9 beats   Occasional PVCs (4.5%), rare SVE (<1%)   Patient triggered events correlated with PVCs   No sustained arrhythmias or significant pauses  Recent Labs: 06/23/2022: Magnesium  2.1 10/07/2022: ALT 14; BUN 11; Creatinine, Ser 0.74; Hemoglobin 11.1; Platelets 383; Potassium 4.1; Sodium 139; TSH 0.966  Recent Lipid Panel    Component Value Date/Time   CHOL 169 03/17/2021 1158   TRIG 269 (H) 03/17/2021 1158   HDL 35 (L) 03/17/2021 1158   CHOLHDL 4.8 (H) 03/17/2021 1158   CHOLHDL 2.9 09/05/2017 0621   VLDL 27 09/05/2017 0621   LDLCALC 89 03/17/2021 1158     Risk Assessment/Calculations:   {Does this patient have ATRIAL FIBRILLATION?:[phone]}       Physical Exam:    VS:  There were no vitals taken for this visit.    Wt Readings from Last 3 Encounters:  10/21/22 289 lb (131.1 kg)  10/07/22 289 lb 6.4 oz (131.3 kg)  07/27/22 289 lb 12.8 oz (131.5 kg)     GEN: *** Well nourished, well developed in no acute distress HEENT: Normal NECK: No JVD; No carotid bruits CARDIAC: ***RRR, no murmurs, rubs, gallops RESPIRATORY:  Clear to auscultation without rales, wheezing or rhonchi  ABDOMEN: Soft, non-tender, non-distended MUSCULOSKELETAL:  No edema; No deformity. *** pedal pulses, ***bilaterally SKIN: Warm and dry NEUROLOGIC:  Alert and oriented x 3 PSYCHIATRIC:  Normal affect   EKG:  EKG is *** ordered today.  The ekg ordered today demonstrates ***  No BP recorded.  {Refresh Note OR Click here to enter BP  :1}***    Diagnoses:    No diagnosis found. Assessment and Plan:        {Are you ordering a CV Procedure (e.g. stress test, cath, DCCV, TEE, etc)?   Press F2        :[id number]   Disposition:  Medication Adjustments/Labs and Tests Ordered: Current medicines are reviewed at length with the patient today.  Concerns regarding medicines are outlined above.  No orders of the defined types were placed in this encounter.  No orders of the defined types were placed in this encounter.   There are no Patient Instructions on file for this visit.   Signed, Emmaline Life, NP  11/09/2022 1:21 PM    [place]

## 2022-11-11 ENCOUNTER — Other Ambulatory Visit (HOSPITAL_BASED_OUTPATIENT_CLINIC_OR_DEPARTMENT_OTHER): Payer: Self-pay

## 2022-11-14 ENCOUNTER — Ambulatory Visit (INDEPENDENT_AMBULATORY_CARE_PROVIDER_SITE_OTHER): Payer: Medicaid Other | Admitting: Licensed Clinical Social Worker

## 2022-11-14 DIAGNOSIS — F3341 Major depressive disorder, recurrent, in partial remission: Secondary | ICD-10-CM

## 2022-11-14 NOTE — Progress Notes (Signed)
Virtual Visit via Video Note  I connected with Deborah Barnes on 11/14/22 at  9:00 AM EST by a video enabled telemedicine application and verified that I am speaking with the correct person using two identifiers.  Location: Patient: pt's home Provider: clinical office   I discussed the limitations of evaluation and management by telemedicine and the availability of in person appointments. The patient expressed understanding and agreed to proceed.   I discussed the assessment and treatment plan with the patient. The patient was provided an opportunity to ask questions and all were answered. The patient agreed with the plan and demonstrated an understanding of the instructions.   The patient was advised to call back or seek an in-person evaluation if the symptoms worsen or if the condition fails to improve as anticipated.  I provided 50 minutes of non-face-to-face time during this encounter.   Heron Nay, LCSWA    THERAPIST PROGRESS NOTE  Session Time: 50 minutes  Participation Level: Active  Behavioral Response: CasualAlertAnxiousand mildly depressed  Type of Therapy: Individual Therapy  Treatment Goals addressed: preparing for termination  ProgressTowards Goals: Progressing  Interventions: Other: person-centered therapy  Summary: Deborah Barnes is a 47 y.o. female who presents for f/u with this cln. She arrives on time and maintains appropriate eye contact throughout the session. She reports things are going differently between her and her husband, that they are getting frustrated with one another. She states she knows that it's normal, but it's new for them. She states things at the house are also very stressful. She states another resident told her that someone else told her she can't wait for pt to leave. Pt reports feeling hurt by this, as she thought they were close. She reports she became depressed, irritable, and withdrawn because of it. She reports she set the  boundary of not wanting to be told what others are saying about her during the house meeting. She talks about the recent issues and how she feels about them. She states her husband got his car fixed but they still don't have a definitive date for her to move. She reports she is having difficulty with him being evasive regarding whether she can stay with him while he is staying with others. She discusses feelings related to this and states she is trying to think more about how he feels. She talks about herself as being demanding and states she is working on this. She talks about how she is looking forward to being with him in-person. She is agreeable to scheduling another session, as she does not believe she will be moving in the next week. She talks about her oldest son, whose 20th birthday is coming up. She states her son is more supportive of her impending move. She also reports her son has started to believe in [place], which makes pt happy. She expresses hope for her upcoming move and is receptive to feedback.  Suicidal/Homicidal: Nowithout intent/plan  Therapist Response: Cln assessed for current stressors, symptoms, and safety since last session. Cln utilized active listening and validation to assist with processing. Cln reframed pt's description of herself from demanding to assertive and described pt's desire to have clear answers as reasonable given the risk she is taking in moving to [place]. Cln asked if pt would like to schedule one more session since her move is not yet finalized, and pt agreed. Cln scheduled follow-up appointment and confirmed pt's availability and preferred method of service delivery (virtual).  Plan: Return again  in 1 weeks.  Diagnosis: MDD (major depressive disorder), recurrent, in partial remission ([place])  Collaboration of Care: Other none required for this visit  Patient/Guardian was advised Release of Information must be obtained prior to any record release in order to  collaborate their care with an outside provider. Patient/Guardian was advised if they have not already done so to contact the registration department to sign all necessary forms in order for [place] to release information regarding their care.   Consent: Patient/Guardian gives verbal consent for treatment and assignment of benefits for services provided during this visit. Patient/Guardian expressed understanding and agreed to proceed.   Heron Nay, LCSWA 11/14/2022

## 2022-11-16 ENCOUNTER — Telehealth (INDEPENDENT_AMBULATORY_CARE_PROVIDER_SITE_OTHER): Payer: Medicaid Other | Admitting: Psychiatry

## 2022-11-16 ENCOUNTER — Other Ambulatory Visit: Payer: Self-pay

## 2022-11-16 ENCOUNTER — Other Ambulatory Visit (HOSPITAL_COMMUNITY): Payer: Self-pay

## 2022-11-16 ENCOUNTER — Encounter (HOSPITAL_COMMUNITY): Payer: Self-pay | Admitting: Psychiatry

## 2022-11-16 DIAGNOSIS — F419 Anxiety disorder, unspecified: Secondary | ICD-10-CM | POA: Diagnosis not present

## 2022-11-16 DIAGNOSIS — F3341 Major depressive disorder, recurrent, in partial remission: Secondary | ICD-10-CM

## 2022-11-16 MED ORDER — HYDROXYZINE HCL 10 MG PO TABS
10.0000 mg | ORAL_TABLET | Freq: Three times a day (TID) | ORAL | 3 refills | Status: AC | PRN
  Filled 2022-11-16: qty 90, 30d supply, fill #0

## 2022-11-16 MED ORDER — CITALOPRAM HYDROBROMIDE 20 MG PO TABS
20.0000 mg | ORAL_TABLET | Freq: Every day | ORAL | 3 refills | Status: AC
  Filled 2022-11-16: qty 60, 60d supply, fill #0

## 2022-11-16 NOTE — Progress Notes (Signed)
BH MD/PA/NP OP Progress Note Virtual Visit via Video Note  I connected with Deborah Barnes on 11/16/22 at 10:00 AM EST by a video enabled telemedicine application and verified that I am speaking with the correct person using two identifiers.  Location: Patient: Work Provider: Clinic   I discussed the limitations of evaluation and management by telemedicine and the availability of in person appointments. The patient expressed understanding and agreed to proceed.  I provided 30 minutes of non-face-to-face time during this encounter.   11/16/2022 10:22 AM Deborah Barnes  MRN:  [id number]  Chief Complaint: "I have been doing well"  HPI:  47 year old female seen today for follow-up psychiatric evaluation.  She has a psychiatric history of bipolar 1 disorder, anxiety, and substance use (alcohol, heroin, marijuana, meth, cocaine, pain pills, notes that she misused gabapentin-Remission 5 years in May of 2024).  Currently she managed Celexa 20 mg twice daily (total 40 mg) and hydroxyzine 10 mg 3 times daily as needed.  Patient also recently started on gabapentin 100 mg 3 times daily by her PCP to help manage pain.  She reports her medications are effective in managing her psychiatric conditions.  Today she she is pleasant, cooperative, engaged in conversation, and maintained eye contact.  She reports that she has been doing well.  She notes that a lot of her prayers have been answered.  She notes that her older son has been sober from illegal substances for a year and is now a [place] and no longer an Engineer, maintenance. She also notes that she reconnected with her ex-husband and is planning on remarrying him.  She notes that she and he will moved to [place] eventually.  Patient informed Probation officer that some stressors that are going on includes her youngest son not peaking to her.  She notes it has been 2 years since they last spoke.  She also notes that one of her peers at her sober living house was talking  about her.  Despite the stressors patient notes that her anxiety, depression, and mood are well-managed.  Provider conducted a GAD-7 and patient scored a 14, at her last visit she scored a 9.  Provider also conducted PHQ-9 and patient scored an 11, at her last visit she scored a 7.  She endorses sleeping 4 to 5 hours nightly. Today she denies SI/HI/AVH, mania, paranoia.  Patient reports that he continues to have back and hip pain.  She reports that she sees a Restaurant manager, fast food. She notes that she requires surgery but notes that she is not ready to have it. She notes that her pain as a 5 out of 10 and notes that she continues to take a muscle relaxer, gabapentin, Mobic, and Tylenol to help manage her pain.    No medication changes made today.  Patient agreeable to continue medications as prescribed.  No other concerns at this time.     Visit Diagnosis:    ICD-10-CM   1. Anxiety  F41.9 hydrOXYzine (ATARAX) 10 MG tablet    citalopram (CELEXA) 20 MG tablet    2. MDD (major depressive disorder), recurrent, in partial remission (HCC)  F33.41 citalopram (CELEXA) 20 MG tablet      Past Psychiatric History: bipolar 1 disorder, anxiety, and substance use (alcohol, heroin, marijuana, meth, cocaine, pain pills, notes that she misused gabapentin)  Past Medical History:  Past Medical History:  Diagnosis Date   Asthma    Back pain    Bipolar disorder ([place])    DX in the  past but not currently be treated   Bulging disc    Complication of anesthesia 2014   anesthesia ? caused depression/suicidal ideation per pt.   COPD (chronic obstructive pulmonary disease) ([place])    De Quervain's tenosynovitis, right 10/2019   surgery   Degenerative disc disease    Depression    Depression with anxiety    DJD (degenerative joint disease)    right hip   GERD (gastroesophageal reflux disease)    Hepatitis C 08/2017   TX for HEP C   Hip pain, right    History of alcohol abuse    History of IBS    History of mixed  drug abuse ([place])    Hypercholesterolemia    Hypertension    Hypoglycemia    Hypothyroidism    PTSD (post-traumatic stress disorder)    Sciatica    Sciatica 01/28/2022   Shortness of breath    Thyroid disease    Vitamin D deficiency 02/2020   Walker as ambulation aid     Past Surgical History:  Procedure Laterality Date   BIOPSY N/A 09/25/2014   Procedure: BIOPSY;  Surgeon: Daneil Dolin, MD;  Location: [facility];  Service: Endoscopy;  Laterality: N/A;  Gastric, Ascending Colon, Descending/Sigmoid Colon    CARPAL TUNNEL RELEASE Bilateral    CHOLECYSTECTOMY N/A 04/26/2013   Procedure: LAPAROSCOPIC CHOLECYSTECTOMY;  Surgeon: Jamesetta So, MD;  Location: [facility];  Service: General;  Laterality: N/A;   COLONOSCOPY WITH PROPOFOL N/A 09/25/2014   RMR: Pancolonic diverticulosis. multiple tubular adenomas, segmental biopsies negative. surveillance in 5 years   COLONOSCOPY WITH PROPOFOL N/A 01/16/2020   diverticulosis, two 4-6 mm polyps in sigmoid and splenic flexure, one 10 mm polyp in sigmoid Tubular adenomas, 3 year surveillance.   DORSAL COMPARTMENT RELEASE Right 10/30/2019   Procedure: RIGHT WRIST 1ST  DORSAL COMPARTMENT RELEASE (DEQUERVAIN);  Surgeon: Marybelle Killings, MD;  Location: [place];  Service: Orthopedics;  Laterality: Right;   ESOPHAGOGASTRODUODENOSCOPY (EGD) WITH PROPOFOL N/A 09/25/2014   RMR: Normal esophagus. Abnormal gastric mucosa status post biopsy (negative H.pylori). Hiatal hernia.    ESOPHAGOGASTRODUODENOSCOPY (EGD) WITH PROPOFOL N/A 01/16/2020   Normal esophagus s/p dilation, normal stomach and duodenal bulb.    INCISIONAL HERNIA REPAIR N/A 10/04/2013   Procedure: HERNIA REPAIR INCISIONAL WITH MESH;  Surgeon: Jamesetta So, MD;  Location: [facility];  Service: General;  Laterality: N/A;   INSERTION OF MESH N/A 10/04/2013   Procedure: INSERTION OF MESH;  Surgeon: Jamesetta So, MD;  Location: [facility];  Service: General;  Laterality: N/A;   MALONEY DILATION N/A 01/16/2020   Procedure:  Deborah Barnes;  Surgeon: Daneil Dolin, MD;  [facility];  Service: Endoscopy;  Laterality: N/A;   POLYPECTOMY N/A 09/25/2014   Procedure: POLYPECTOMY;  Surgeon: Daneil Dolin, MD;  Location: [facility];  Service: Endoscopy;  Laterality: N/A;  Cecal, Ascending Colon    POLYPECTOMY  01/16/2020   Procedure: POLYPECTOMY;  Surgeon: Daneil Dolin, MD;  [facility];  Service: Endoscopy;;   SHOULDER SURGERY Left    TOOTH EXTRACTION  08/09/2019   TOTAL HIP ARTHROPLASTY Right 04/27/2020   Procedure: TOTAL HIP ARTHROPLASTY DIRECT ANTERIOR;  Surgeon: Marybelle Killings, MD;  Location: [facility];  Service: Orthopedics;  Laterality: Right;   TUBAL LIGATION     WISDOM TOOTH EXTRACTION      Family Psychiatric History:  106 year old son mental health conditions  Family History:  Family History  Problem Relation  Age of Onset   Diabetes Father    Colon polyps Maternal Aunt    Colon cancer Maternal Grandmother    Heart disease Paternal Grandmother    Cancer Paternal Grandmother        esophageal   Hypertension Paternal Grandmother    GER disease Paternal Grandmother    Osteoporosis Paternal Grandmother     Social History:  Social History   Socioeconomic History   Marital status: Married    Spouse name: Not on file   Number of children: 2   Years of education: Not on file   Highest education level: Not on file  Occupational History   Occupation: Radio broadcast assistant  Tobacco Use   Smoking status: Former    Packs/day: 0.50    Years: 27.00    Total pack years: 13.50    Types: Cigarettes    Quit date: 08/24/2018    Years since quitting: 4.2   Smokeless tobacco: Current   Tobacco comments:    vape former user  Vaping Use   Vaping Use: Every day   Last attempt to quit: 08/24/2018   Substances: Nicotine  Substance and Sexual Activity   Alcohol use: Not Currently    Alcohol/week: 1.0 standard drink of alcohol    Types: 1 Cans of beer per week    Comment: hx binge drinker. none  since 01/23/18   Drug use: Not Currently    Types: Marijuana, Cocaine, Methamphetamines, Heroin    Comment: no cocaine, heroin, meth since 01/23/18. last marijuana use 01-23-18   Sexual activity: Not Currently    Birth control/protection: Surgical    Comment: tubal ligation  Other Topics Concern   Not on file  Social History Narrative   Not on file   Social Determinants of Health   Financial Resource Strain: Not on file  Food Insecurity: Not on file  Transportation Needs: Not on file  Physical Activity: Not on file  Stress: Not on file  Social Connections: Not on file    Allergies:  Allergies  Allergen Reactions   Clarithromycin Diarrhea    abd pain   Propoxyphene Itching    Metabolic Disorder Labs: Lab Results  Component Value Date   HGBA1C 5.7 (A) 06/13/2022   HGBA1C 5.7 06/13/2022   HGBA1C 5.7 06/13/2022   HGBA1C 5.7 06/13/2022   MPG 100 09/05/2017   No results found for: "PROLACTIN" Lab Results  Component Value Date   CHOL 169 03/17/2021   TRIG 269 (H) 03/17/2021   HDL 35 (L) 03/17/2021   CHOLHDL 4.8 (H) 03/17/2021   VLDL 27 09/05/2017   LDLCALC 89 03/17/2021   LDLCALC 90 03/13/2020   Lab Results  Component Value Date   TSH 0.966 10/07/2022   TSH 0.646 03/11/2022    Therapeutic Level Labs: No results found for: "LITHIUM" No results found for: "VALPROATE" Lab Results  Component Value Date   CBMZ 6.2 08/17/2017   CBMZ 8.4 08/17/2017    Current Medications: Current Outpatient Medications  Medication Sig Dispense Refill   acetaminophen (TYLENOL) 500 MG tablet Take 500 mg by mouth every 6 (six) hours as needed.     albuterol (PROVENTIL HFA) 108 (90 Base) MCG/ACT inhaler Inhale 1-2 puffs into the lungs every 6 (six) hours as needed for wheezing or shortness of breath. 8.5 g 1   amLODipine (NORVASC) 5 MG tablet Take 1 tablet (5 mg total) by mouth daily. 90 tablet 3   blood glucose meter kit and supplies Dispense based on patient and insurance  preference.  Use up to four times daily as directed. (FOR ICD-10 E10.9, E11.9). 1 each 0   carvedilol (COREG) 25 MG tablet Take 1 tablet (25 mg total) by mouth 2 (two) times daily with a meal. 180 tablet 3   citalopram (CELEXA) 20 MG tablet Take 1 tablet (20 mg total) by mouth daily. 60 tablet 3   colestipol (COLESTID) 1 g tablet Take 2 tablets (2 g total) by mouth 2 (two) times daily. 120 tablet 11   gabapentin (NEURONTIN) 100 MG capsule Take 1 capsule (100 mg total) by mouth 3 (three) times daily. 90 capsule 3   hydrOXYzine (ATARAX) 10 MG tablet Take 1 tablet (10 mg total) by mouth 3 (three) times daily as needed. 90 tablet 3   ibuprofen (ADVIL) 200 MG tablet Take 200 mg by mouth every 6 (six) hours as needed for mild pain.     levothyroxine (SYNTHROID) 50 MCG tablet Take 1 tablet (50 mcg total) by mouth daily before breakfast. 90 tablet 1   meloxicam (MOBIC) 15 MG tablet Take 1 tablet (15 mg total) by mouth daily. 30 tablet 0   metFORMIN (GLUCOPHAGE-XR) 750 MG 24 hr tablet Take 1 tablet (750 mg total) by mouth daily with breakfast. 90 tablet 3   omeprazole (PRILOSEC) 40 MG capsule Take 1 capsule (40 mg total) by mouth in the morning and at bedtime. 112 capsule 0   tiZANidine (ZANAFLEX) 4 MG tablet Take 1 tablet (4 mg total) by mouth 3 (three) times daily. 90 tablet 1   triamcinolone cream (KENALOG) 0.1 % Apply topically 2 (two) times daily. 30 g 0   Current Facility-Administered Medications  Medication Dose Route Frequency Provider Last Rate Last Admin   0.9 %  sodium chloride infusion  500 mL Intravenous Once Sharyn Creamer, MD         Musculoskeletal: Strength & Muscle Tone: within normal limits and telehealth, visit Gait & Station: normal, telehealth visit Patient leans: N/A  Psychiatric Specialty Exam: Review of Systems  There were no vitals taken for this visit.There is no height or weight on file to calculate BMI.  General Appearance: Well Groomed  Eye Contact:  Good  Speech:  Clear and  Coherent and Normal Rate  Volume:  Normal  Mood:  Euthymic  Affect:  Appropriate and Congruent  Thought Process:  Coherent, Goal Directed, and Linear  Orientation:  Full (Time, Place, and Person)  Thought Content: WDL and Logical   Suicidal Thoughts:  No  Homicidal Thoughts:  No  Memory:  Immediate;   Good Recent;   Good Remote;   Good  Judgement:  Good  Insight:  Good  Psychomotor Activity:  Normal  Concentration:  Concentration: Good and Attention Span: Good  Recall:  Good  Fund of Knowledge: Good  Language: Good  Akathisia:  No  Handed:  Right  AIMS (if indicated): not done  Assets:  Communication Skills Desire for Improvement Financial Resources/Insurance Housing Leisure Time Social Support  ADL's:  Intact  Cognition: WNL  Sleep:  Fair   Screenings: AIMS    [place] Admission (Discharged) from 09/04/2017 in [facility]  AIMS Total Score 0      AUDIT    Flowsheet Row Admission (Discharged) from 09/04/2017 in [place] [facility]  Alcohol Use Disorder Identification Test Final Score (AUDIT) 7      GAD-7    Flowsheet Row [facility]16/2022 [facility] Video Visit  from 06/29/2022 in [facility] Video Visit from 03/31/2022 in [facility] Video Visit from 10/14/2021 in [facility] Video Visit from 07/20/2021 in [facility]  Total GAD-7 Score '14 9 5 10 9      '$ PHQ2-9    Flowsheet Row Video Visit from 11/16/2022 in [facility] Office Visit from 10/07/2022 in [place] Video Visit from 06/29/2022 in [facility] Video Visit from 03/31/2022 in [facility] Office Visit from 12/08/2021 in [place]  PHQ-2 Total Score '4 1 2 '$ 0 0   PHQ-9 Total Score '11 1 7 4 1      '$ Flowsheet Row ED from 06/23/2022 in [facility] Emergency Department at [facility] ED from 06/07/2021 in [facility] Emergency Department at [facility] Office Visit from 04/14/2021 in [place] No Risk No Risk No Risk        Assessment and Plan: Patient endorses reports that she has some life stressors but notes that overall she is doing well.  She is excited about her upcoming marriage with her ex-husband and the success of her oldest son and his sobriety.  Overall she reports that she is doing well.  No medication changes made today.  Patient agreeable to continue medication as prescribed. 1. Anxiety  Continue- citalopram (CELEXA) 20 MG tablet; Take 1 tablet (20 mg total) by mouth daily.  Dispense: 60 tablet; Refill: 3 Continue- hydrOXYzine (ATARAX) 10 MG tablet; Take 1 tablet (10 mg total) by mouth 3 (three) times daily as needed.  Dispense: 90 tablet; Refill: 3  2. MDD (major depressive disorder), recurrent, in partial remission (HCC)  Continue- citalopram (CELEXA) 20 MG tablet; Take 1 tablet (20 mg total) by mouth daily.  Dispense: 60 tablet; Refill: 3   Follow-up in 3 Follow-up with therapy  Salley Slaughter, NP 11/16/2022, 10:22 AM

## 2022-11-17 ENCOUNTER — Other Ambulatory Visit (HOSPITAL_COMMUNITY): Payer: Self-pay

## 2022-11-17 NOTE — Telephone Encounter (Signed)
Appt has been made. Kh

## 2022-11-18 ENCOUNTER — Telehealth (HOSPITAL_COMMUNITY): Payer: Self-pay | Admitting: Licensed Clinical Social Worker

## 2022-11-21 ENCOUNTER — Ambulatory Visit: Payer: Medicaid Other | Admitting: Nurse Practitioner

## 2022-11-23 ENCOUNTER — Ambulatory Visit (HOSPITAL_COMMUNITY): Payer: Medicaid Other | Admitting: Licensed Clinical Social Worker

## 2022-11-24 ENCOUNTER — Ambulatory Visit (INDEPENDENT_AMBULATORY_CARE_PROVIDER_SITE_OTHER): Payer: Medicaid Other | Admitting: Licensed Clinical Social Worker

## 2022-11-24 DIAGNOSIS — F3341 Major depressive disorder, recurrent, in partial remission: Secondary | ICD-10-CM

## 2022-11-24 NOTE — Progress Notes (Signed)
Virtual Visit via Video Note  I connected with Deborah Barnes on 11/24/22 at  8:00 AM EST by a video enabled telemedicine application and verified that I am speaking with the correct person using two identifiers.  Location: Patient: pt's home Provider: clinical office   I discussed the limitations of evaluation and management by telemedicine and the availability of in person appointments. The patient expressed understanding and agreed to proceed.   I discussed the assessment and treatment plan with the patient. The patient was provided an opportunity to ask questions and all were answered. The patient agreed with the plan and demonstrated an understanding of the instructions.   The patient was advised to call back or seek an in-person evaluation if the symptoms worsen or if the condition fails to improve as anticipated.  I provided 58 minutes of non-face-to-face time during this encounter.   Heron Nay, LCSWA    THERAPIST PROGRESS NOTE  Session Time: 58 minutes  Participation Level: Active  Behavioral Response: CasualAlertCheerful  Type of Therapy: Individual Therapy  Treatment Goals addressed: Termination  ProgressTowards Goals: Met  Interventions: Other: Person-centered therapy  Summary: Deborah Barnes is a 47 y.o. female who presents for the final session with this cln. She arrives on time and maintains appropriate eye contact throughout the session. She reports she is moving to [place] on Sunday and that she and her husband will be moving into a boarding house the following day. She expresses excitement and shares her preparations and plans. She shares some of her experiences regarding saying goodbye to her housemates and her [employer] Boss and talks about the things she looks forward to. She is future-oriented and discusses some disbelief that it is finally happening. She confirms she has enough medications and refills and reports she has instructions for  applying for [place] Medicaid. She makes a note to research community mental health options when she moves for continuity of care. She expresses gratitude to cln and asks to send cln updates via email.  Suicidal/Homicidal: Nowithout intent/plan  Therapist Response: Cln assessed for current stressors, symptoms, and safety since last session. Cln utilized active listening and validation to assist with processing. Cln provided education on the expected range of emotions experienced during life transitions and normalized feelings of sadness and grief even though the changes are positive. Cln expressed gratitude to pt for pt's openness and vulnerability and shared that pt's disclosures provided hope to cln regarding working with those that have abused substances due to pt's sustained recovery and progress. Cln shared pt's excitement for the upcoming move and encouraged pt to email updates and wished pt well.  Plan: Follow up with new providers in [place]  Diagnosis: MDD (major depressive disorder), recurrent, in partial remission ([place])  Collaboration of Care: Other none required for this visit.  Patient/Guardian was advised Release of Information must be obtained prior to any record release in order to collaborate their care with an outside provider. Patient/Guardian was advised if they have not already done so to contact the registration department to sign all necessary forms in order for [place] to release information regarding their care.   Consent: Patient/Guardian gives verbal consent for treatment and assignment of benefits for services provided during this visit. Patient/Guardian expressed understanding and agreed to proceed.   Heron Nay, LCSWA 11/24/2022

## 2022-11-25 ENCOUNTER — Other Ambulatory Visit (HOSPITAL_COMMUNITY): Payer: Self-pay

## 2022-12-01 ENCOUNTER — Ambulatory Visit: Payer: Self-pay | Admitting: Nurse Practitioner

## 2022-12-06 ENCOUNTER — Other Ambulatory Visit (HOSPITAL_COMMUNITY): Payer: Self-pay

## 2022-12-06 ENCOUNTER — Other Ambulatory Visit: Payer: Self-pay

## 2022-12-13 ENCOUNTER — Ambulatory Visit: Payer: Medicaid Other | Admitting: Gastroenterology

## 2023-01-06 ENCOUNTER — Ambulatory Visit: Payer: Self-pay | Admitting: Nurse Practitioner

## 2023-01-12 ENCOUNTER — Encounter: Payer: Self-pay | Admitting: *Deleted

## 2023-01-27 ENCOUNTER — Telehealth (HOSPITAL_COMMUNITY): Payer: Medicaid Other | Admitting: Psychiatry

## 2023-01-30 ENCOUNTER — Ambulatory Visit: Payer: Self-pay | Admitting: Cardiology

## 2023-02-03 ENCOUNTER — Other Ambulatory Visit (HOSPITAL_COMMUNITY): Payer: Self-pay

## 2023-02-03 ENCOUNTER — Telehealth: Payer: Self-pay

## 2023-02-03 ENCOUNTER — Other Ambulatory Visit: Payer: Self-pay | Admitting: Nurse Practitioner

## 2023-02-03 ENCOUNTER — Other Ambulatory Visit: Payer: Self-pay

## 2023-02-03 DIAGNOSIS — G8929 Other chronic pain: Secondary | ICD-10-CM

## 2023-02-03 MED ORDER — GABAPENTIN 100 MG PO CAPS
100.0000 mg | ORAL_CAPSULE | Freq: Three times a day (TID) | ORAL | 3 refills | Status: AC
  Filled 2023-02-03: qty 90, 30d supply, fill #0

## 2023-02-04 ENCOUNTER — Other Ambulatory Visit (HOSPITAL_COMMUNITY): Payer: Self-pay

## 2023-02-06 ENCOUNTER — Other Ambulatory Visit: Payer: Self-pay

## 2023-02-14 NOTE — Telephone Encounter (Signed)
Medication sent in per provider. Gh 

## 2023-07-17 ENCOUNTER — Other Ambulatory Visit: Payer: Self-pay

## 2023-07-17 DIAGNOSIS — R002 Palpitations: Secondary | ICD-10-CM

## 2023-07-17 MED ORDER — AMLODIPINE BESYLATE 5 MG PO TABS: 5.0000 mg | ORAL_TABLET | Freq: Every day | ORAL | 0 refills | Status: DC

## 2023-07-17 MED ORDER — CARVEDILOL 25 MG PO TABS: 25.0000 mg | ORAL_TABLET | Freq: Two times a day (BID) | ORAL | 0 refills | Status: DC

## 2023-08-16 ENCOUNTER — Other Ambulatory Visit: Payer: Self-pay

## 2023-08-16 DIAGNOSIS — R002 Palpitations: Secondary | ICD-10-CM

## 2023-08-16 MED ORDER — AMLODIPINE BESYLATE 5 MG PO TABS: 5.0000 mg | ORAL_TABLET | Freq: Every day | ORAL | 0 refills | Status: AC

## 2023-08-16 MED ORDER — CARVEDILOL 25 MG PO TABS: 25.0000 mg | ORAL_TABLET | Freq: Two times a day (BID) | ORAL | 0 refills | Status: AC
